# Patient Record
Sex: Female | Born: 1940 | Race: Black or African American | Hispanic: No | Marital: Married | Smoking: Former smoker
Health system: Southern US, Community
[De-identification: ages and names within clinical notes are randomized; demographics above are authoritative.]

## PROBLEM LIST (undated history)

## (undated) DIAGNOSIS — K635 Polyp of colon: Secondary | ICD-10-CM

## (undated) DIAGNOSIS — R32 Unspecified urinary incontinence: Secondary | ICD-10-CM

## (undated) DIAGNOSIS — K219 Gastro-esophageal reflux disease without esophagitis: Secondary | ICD-10-CM

## (undated) DIAGNOSIS — M1990 Unspecified osteoarthritis, unspecified site: Secondary | ICD-10-CM

## (undated) DIAGNOSIS — M62838 Other muscle spasm: Secondary | ICD-10-CM

## (undated) DIAGNOSIS — Z0000 Encounter for general adult medical examination without abnormal findings: Secondary | ICD-10-CM

## (undated) DIAGNOSIS — J069 Acute upper respiratory infection, unspecified: Secondary | ICD-10-CM

## (undated) DIAGNOSIS — Z124 Encounter for screening for malignant neoplasm of cervix: Secondary | ICD-10-CM

## (undated) DIAGNOSIS — R0602 Shortness of breath: Secondary | ICD-10-CM

## (undated) DIAGNOSIS — M549 Dorsalgia, unspecified: Secondary | ICD-10-CM

## (undated) DIAGNOSIS — E739 Lactose intolerance, unspecified: Secondary | ICD-10-CM

## (undated) DIAGNOSIS — D649 Anemia, unspecified: Secondary | ICD-10-CM

## (undated) DIAGNOSIS — R7989 Other specified abnormal findings of blood chemistry: Secondary | ICD-10-CM

## (undated) DIAGNOSIS — R399 Unspecified symptoms and signs involving the genitourinary system: Secondary | ICD-10-CM

## (undated) DIAGNOSIS — E559 Vitamin D deficiency, unspecified: Secondary | ICD-10-CM

## (undated) DIAGNOSIS — I10 Essential (primary) hypertension: Secondary | ICD-10-CM

## (undated) DIAGNOSIS — G5600 Carpal tunnel syndrome, unspecified upper limb: Secondary | ICD-10-CM

## (undated) DIAGNOSIS — H269 Unspecified cataract: Secondary | ICD-10-CM

## (undated) DIAGNOSIS — M8580 Other specified disorders of bone density and structure, unspecified site: Secondary | ICD-10-CM

## (undated) DIAGNOSIS — L989 Disorder of the skin and subcutaneous tissue, unspecified: Secondary | ICD-10-CM

## (undated) DIAGNOSIS — R490 Dysphonia: Secondary | ICD-10-CM

## (undated) DIAGNOSIS — Z872 Personal history of diseases of the skin and subcutaneous tissue: Secondary | ICD-10-CM

## (undated) HISTORY — DX: Unspecified osteoarthritis, unspecified site: M19.90

## (undated) HISTORY — DX: Dysphonia: R49.0

## (undated) HISTORY — DX: Encounter for general adult medical examination without abnormal findings: Z00.00

## (undated) HISTORY — PX: WRIST GANGLION EXCISION: SUR520

## (undated) HISTORY — DX: Other specified disorders of bone density and structure, unspecified site: M85.80

## (undated) HISTORY — DX: Essential (primary) hypertension: I10

## (undated) HISTORY — DX: Lactose intolerance, unspecified: E73.9

## (undated) HISTORY — DX: Gastro-esophageal reflux disease without esophagitis: K21.9

## (undated) HISTORY — DX: Shortness of breath: R06.02

## (undated) HISTORY — DX: Personal history of diseases of the skin and subcutaneous tissue: Z87.2

## (undated) HISTORY — PX: BREAST CYST EXCISION: SHX579

## (undated) HISTORY — DX: Vitamin D deficiency, unspecified: E55.9

## (undated) HISTORY — DX: Other muscle spasm: M62.838

## (undated) HISTORY — DX: Disorder of the skin and subcutaneous tissue, unspecified: L98.9

## (undated) HISTORY — DX: Polyp of colon: K63.5

## (undated) HISTORY — DX: Unspecified urinary incontinence: R32

## (undated) HISTORY — DX: Encounter for screening for malignant neoplasm of cervix: Z12.4

## (undated) HISTORY — PX: OVARY SURGERY: SHX727

## (undated) HISTORY — DX: Anemia, unspecified: D64.9

## (undated) HISTORY — DX: Unspecified symptoms and signs involving the genitourinary system: R39.9

## (undated) HISTORY — DX: Other specified abnormal findings of blood chemistry: R79.89

## (undated) HISTORY — DX: Dorsalgia, unspecified: M54.9

## (undated) HISTORY — DX: Acute upper respiratory infection, unspecified: J06.9

## (undated) HISTORY — DX: Unspecified cataract: H26.9

## (undated) HISTORY — DX: Carpal tunnel syndrome, unspecified upper limb: G56.00

## (undated) HISTORY — PX: APPENDECTOMY: SHX54

---

## 2000-05-26 ENCOUNTER — Encounter: Payer: Self-pay | Admitting: Family Medicine

## 2000-05-26 ENCOUNTER — Ambulatory Visit (HOSPITAL_COMMUNITY): Admission: RE | Admit: 2000-05-26 | Discharge: 2000-05-26 | Payer: Self-pay | Admitting: Family Medicine

## 2001-08-08 ENCOUNTER — Other Ambulatory Visit: Admission: RE | Admit: 2001-08-08 | Discharge: 2001-08-08 | Payer: Self-pay | Admitting: *Deleted

## 2003-06-14 ENCOUNTER — Emergency Department (HOSPITAL_COMMUNITY): Admission: EM | Admit: 2003-06-14 | Discharge: 2003-06-14 | Payer: Self-pay | Admitting: Family Medicine

## 2005-02-20 ENCOUNTER — Encounter: Payer: Self-pay | Admitting: Internal Medicine

## 2005-02-20 LAB — CONVERTED CEMR LAB

## 2005-03-06 ENCOUNTER — Ambulatory Visit: Payer: Self-pay | Admitting: Internal Medicine

## 2005-03-25 ENCOUNTER — Ambulatory Visit: Payer: Self-pay | Admitting: Cardiology

## 2005-03-27 ENCOUNTER — Ambulatory Visit: Payer: Self-pay | Admitting: Internal Medicine

## 2005-04-20 ENCOUNTER — Ambulatory Visit: Payer: Self-pay | Admitting: Internal Medicine

## 2005-04-30 ENCOUNTER — Ambulatory Visit: Payer: Self-pay | Admitting: Internal Medicine

## 2005-05-04 ENCOUNTER — Ambulatory Visit: Payer: Self-pay | Admitting: Internal Medicine

## 2005-06-03 ENCOUNTER — Ambulatory Visit: Payer: Self-pay | Admitting: Internal Medicine

## 2005-06-10 ENCOUNTER — Ambulatory Visit: Payer: Self-pay | Admitting: Internal Medicine

## 2005-06-19 ENCOUNTER — Ambulatory Visit (HOSPITAL_COMMUNITY): Admission: RE | Admit: 2005-06-19 | Discharge: 2005-06-19 | Payer: Self-pay | Admitting: Internal Medicine

## 2005-06-25 ENCOUNTER — Ambulatory Visit: Payer: Self-pay | Admitting: Internal Medicine

## 2005-07-10 ENCOUNTER — Ambulatory Visit: Payer: Self-pay | Admitting: Internal Medicine

## 2005-07-20 ENCOUNTER — Ambulatory Visit: Payer: Self-pay | Admitting: Internal Medicine

## 2005-07-20 ENCOUNTER — Encounter (INDEPENDENT_AMBULATORY_CARE_PROVIDER_SITE_OTHER): Payer: Self-pay | Admitting: Specialist

## 2005-10-08 ENCOUNTER — Ambulatory Visit (HOSPITAL_COMMUNITY): Admission: RE | Admit: 2005-10-08 | Discharge: 2005-10-08 | Payer: Self-pay | Admitting: Internal Medicine

## 2005-10-08 ENCOUNTER — Ambulatory Visit: Payer: Self-pay | Admitting: Internal Medicine

## 2005-10-28 ENCOUNTER — Encounter: Admission: RE | Admit: 2005-10-28 | Discharge: 2005-11-18 | Payer: Self-pay | Admitting: Internal Medicine

## 2005-11-06 ENCOUNTER — Ambulatory Visit: Payer: Self-pay | Admitting: Internal Medicine

## 2005-11-30 ENCOUNTER — Ambulatory Visit: Payer: Self-pay | Admitting: Internal Medicine

## 2005-12-21 ENCOUNTER — Ambulatory Visit: Payer: Self-pay | Admitting: Internal Medicine

## 2006-03-22 ENCOUNTER — Ambulatory Visit: Payer: Self-pay | Admitting: Internal Medicine

## 2006-03-22 LAB — CONVERTED CEMR LAB
BUN: 16 mg/dL (ref 6–23)
CO2: 30 meq/L (ref 19–32)
Calcium: 8.8 mg/dL (ref 8.4–10.5)
Chloride: 108 meq/L (ref 96–112)
Creatinine, Ser: 1.1 mg/dL (ref 0.4–1.2)
GFR calc Af Amer: 64 mL/min
GFR calc non Af Amer: 53 mL/min
Glucose, Bld: 77 mg/dL (ref 70–99)
Potassium: 3.7 meq/L (ref 3.5–5.1)
Sodium: 145 meq/L (ref 135–145)

## 2006-03-25 ENCOUNTER — Ambulatory Visit: Payer: Self-pay | Admitting: Internal Medicine

## 2006-08-06 ENCOUNTER — Encounter: Payer: Self-pay | Admitting: Internal Medicine

## 2006-08-06 DIAGNOSIS — I10 Essential (primary) hypertension: Secondary | ICD-10-CM | POA: Insufficient documentation

## 2006-08-06 DIAGNOSIS — Q638 Other specified congenital malformations of kidney: Secondary | ICD-10-CM

## 2006-08-06 DIAGNOSIS — Q631 Lobulated, fused and horseshoe kidney: Secondary | ICD-10-CM | POA: Insufficient documentation

## 2006-09-22 ENCOUNTER — Ambulatory Visit: Payer: Self-pay | Admitting: Internal Medicine

## 2006-09-22 ENCOUNTER — Ambulatory Visit: Payer: Self-pay | Admitting: Family Medicine

## 2007-03-21 ENCOUNTER — Ambulatory Visit: Payer: Self-pay | Admitting: Internal Medicine

## 2007-03-21 DIAGNOSIS — R252 Cramp and spasm: Secondary | ICD-10-CM | POA: Insufficient documentation

## 2007-03-21 DIAGNOSIS — R059 Cough, unspecified: Secondary | ICD-10-CM | POA: Insufficient documentation

## 2007-03-21 DIAGNOSIS — M25539 Pain in unspecified wrist: Secondary | ICD-10-CM | POA: Insufficient documentation

## 2007-03-21 DIAGNOSIS — R05 Cough: Secondary | ICD-10-CM | POA: Insufficient documentation

## 2007-03-21 DIAGNOSIS — M62838 Other muscle spasm: Secondary | ICD-10-CM

## 2007-03-21 DIAGNOSIS — J209 Acute bronchitis, unspecified: Secondary | ICD-10-CM | POA: Insufficient documentation

## 2007-03-21 HISTORY — DX: Other muscle spasm: M62.838

## 2007-03-29 ENCOUNTER — Encounter: Payer: Self-pay | Admitting: Internal Medicine

## 2007-04-07 ENCOUNTER — Emergency Department (HOSPITAL_COMMUNITY): Admission: EM | Admit: 2007-04-07 | Discharge: 2007-04-07 | Payer: Self-pay | Admitting: Emergency Medicine

## 2007-04-08 ENCOUNTER — Telehealth: Payer: Self-pay | Admitting: Internal Medicine

## 2007-04-08 ENCOUNTER — Ambulatory Visit: Payer: Self-pay | Admitting: Internal Medicine

## 2007-04-08 DIAGNOSIS — S139XXA Sprain of joints and ligaments of unspecified parts of neck, initial encounter: Secondary | ICD-10-CM | POA: Insufficient documentation

## 2007-04-09 ENCOUNTER — Telehealth (INDEPENDENT_AMBULATORY_CARE_PROVIDER_SITE_OTHER): Payer: Self-pay | Admitting: *Deleted

## 2007-05-17 ENCOUNTER — Telehealth: Payer: Self-pay | Admitting: Internal Medicine

## 2007-05-18 ENCOUNTER — Encounter (INDEPENDENT_AMBULATORY_CARE_PROVIDER_SITE_OTHER): Payer: Self-pay | Admitting: *Deleted

## 2007-06-16 ENCOUNTER — Encounter: Payer: Self-pay | Admitting: Internal Medicine

## 2007-07-01 ENCOUNTER — Ambulatory Visit: Payer: Self-pay | Admitting: Internal Medicine

## 2007-07-01 DIAGNOSIS — J309 Allergic rhinitis, unspecified: Secondary | ICD-10-CM | POA: Insufficient documentation

## 2007-10-21 ENCOUNTER — Ambulatory Visit: Payer: Self-pay | Admitting: Internal Medicine

## 2007-10-21 LAB — CONVERTED CEMR LAB
BUN: 20 mg/dL (ref 6–23)
Basophils Relative: 0.8 % (ref 0.0–3.0)
CRP, High Sensitivity: 19 — ABNORMAL HIGH (ref 0.00–5.00)
Calcium: 8.8 mg/dL (ref 8.4–10.5)
Creatinine, Ser: 1.1 mg/dL (ref 0.4–1.2)
Eosinophils Absolute: 0.4 10*3/uL (ref 0.0–0.7)
Eosinophils Relative: 6.7 % — ABNORMAL HIGH (ref 0.0–5.0)
GFR calc Af Amer: 64 mL/min
GFR calc non Af Amer: 53 mL/min
Glucose, Bld: 82 mg/dL (ref 70–99)
HCT: 37 % (ref 36.0–46.0)
HDL: 40.4 mg/dL (ref >39.0)
Hemoglobin: 12.6 g/dL (ref 12.0–15.0)
MCV: 86 fL (ref 78.0–100.0)
Monocytes Absolute: 0.6 10*3/uL (ref 0.1–1.0)
Monocytes Relative: 8.4 % (ref 3.0–12.0)
Neutro Abs: 3.6 10*3/uL (ref 1.4–7.7)
Platelets: 219 10*3/uL (ref 150–400)
Potassium: 4.2 meq/L (ref 3.5–5.1)
RBC: 4.31 M/uL (ref 3.87–5.11)
Total CHOL/HDL Ratio: 4.9
WBC: 6.7 10*3/uL (ref 4.5–10.5)

## 2007-10-28 ENCOUNTER — Ambulatory Visit: Payer: Self-pay | Admitting: Internal Medicine

## 2007-10-28 DIAGNOSIS — G4700 Insomnia, unspecified: Secondary | ICD-10-CM | POA: Insufficient documentation

## 2007-10-28 DIAGNOSIS — E782 Mixed hyperlipidemia: Secondary | ICD-10-CM | POA: Insufficient documentation

## 2007-11-18 ENCOUNTER — Encounter: Payer: Self-pay | Admitting: Internal Medicine

## 2007-11-21 ENCOUNTER — Ambulatory Visit: Payer: Self-pay | Admitting: Internal Medicine

## 2007-12-15 ENCOUNTER — Ambulatory Visit (HOSPITAL_BASED_OUTPATIENT_CLINIC_OR_DEPARTMENT_OTHER): Admission: RE | Admit: 2007-12-15 | Discharge: 2007-12-15 | Payer: Self-pay | Admitting: Orthopedic Surgery

## 2007-12-15 ENCOUNTER — Encounter (INDEPENDENT_AMBULATORY_CARE_PROVIDER_SITE_OTHER): Payer: Self-pay | Admitting: Orthopedic Surgery

## 2007-12-23 ENCOUNTER — Ambulatory Visit: Payer: Self-pay | Admitting: Internal Medicine

## 2007-12-23 LAB — CONVERTED CEMR LAB
AST: 14 units/L (ref 0–37)
HDL: 41.4 mg/dL (ref >39.0)
Total CHOL/HDL Ratio: 3.9
Triglycerides: 51 mg/dL (ref 0–149)
VLDL: 10 mg/dL (ref 0–40)

## 2007-12-30 ENCOUNTER — Encounter: Payer: Self-pay | Admitting: Internal Medicine

## 2008-01-24 ENCOUNTER — Ambulatory Visit: Payer: Self-pay | Admitting: Internal Medicine

## 2008-01-24 DIAGNOSIS — R220 Localized swelling, mass and lump, head: Secondary | ICD-10-CM | POA: Insufficient documentation

## 2008-01-24 DIAGNOSIS — R221 Localized swelling, mass and lump, neck: Secondary | ICD-10-CM

## 2008-01-25 ENCOUNTER — Ambulatory Visit: Payer: Self-pay | Admitting: Internal Medicine

## 2008-01-25 ENCOUNTER — Telehealth: Payer: Self-pay | Admitting: Internal Medicine

## 2008-01-25 LAB — CONVERTED CEMR LAB: BUN: 19 mg/dL (ref 6–23)

## 2008-01-27 ENCOUNTER — Ambulatory Visit: Payer: Self-pay | Admitting: Cardiology

## 2008-01-27 ENCOUNTER — Encounter: Payer: Self-pay | Admitting: Internal Medicine

## 2008-01-29 ENCOUNTER — Telehealth (INDEPENDENT_AMBULATORY_CARE_PROVIDER_SITE_OTHER): Payer: Self-pay | Admitting: *Deleted

## 2008-02-15 ENCOUNTER — Other Ambulatory Visit: Admission: RE | Admit: 2008-02-15 | Discharge: 2008-02-15 | Payer: Self-pay | Admitting: Otolaryngology

## 2008-02-15 ENCOUNTER — Encounter: Payer: Self-pay | Admitting: Internal Medicine

## 2008-03-05 ENCOUNTER — Ambulatory Visit: Payer: Self-pay | Admitting: Diagnostic Radiology

## 2008-03-05 ENCOUNTER — Ambulatory Visit (HOSPITAL_BASED_OUTPATIENT_CLINIC_OR_DEPARTMENT_OTHER): Admission: RE | Admit: 2008-03-05 | Discharge: 2008-03-05 | Payer: Self-pay | Admitting: Internal Medicine

## 2008-03-05 ENCOUNTER — Ambulatory Visit: Payer: Self-pay | Admitting: Internal Medicine

## 2008-03-09 ENCOUNTER — Encounter: Payer: Self-pay | Admitting: Internal Medicine

## 2008-03-22 ENCOUNTER — Ambulatory Visit (HOSPITAL_BASED_OUTPATIENT_CLINIC_OR_DEPARTMENT_OTHER): Admission: RE | Admit: 2008-03-22 | Discharge: 2008-03-22 | Payer: Self-pay | Admitting: Internal Medicine

## 2008-03-22 ENCOUNTER — Ambulatory Visit: Payer: Self-pay | Admitting: Diagnostic Radiology

## 2008-03-22 ENCOUNTER — Ambulatory Visit: Payer: Self-pay | Admitting: Internal Medicine

## 2008-03-22 ENCOUNTER — Telehealth: Payer: Self-pay | Admitting: Internal Medicine

## 2008-03-22 DIAGNOSIS — R0989 Other specified symptoms and signs involving the circulatory and respiratory systems: Secondary | ICD-10-CM | POA: Insufficient documentation

## 2008-03-22 DIAGNOSIS — R0609 Other forms of dyspnea: Secondary | ICD-10-CM

## 2008-03-22 LAB — CONVERTED CEMR LAB
Chloride: 109 meq/L (ref 96–112)
Eosinophils Absolute: 0.5 10*3/uL (ref 0.0–0.7)
GFR calc Af Amer: 44 mL/min
GFR calc non Af Amer: 37 mL/min
Glucose, Bld: 88 mg/dL (ref 70–99)
HCT: 36.7 % (ref 36.0–46.0)
Hemoglobin: 12.6 g/dL (ref 12.0–15.0)
MCHC: 34.4 g/dL (ref 30.0–36.0)
Monocytes Absolute: 4.3 10*3/uL — ABNORMAL HIGH (ref 0.1–1.0)
Potassium: 4.3 meq/L (ref 3.5–5.1)
RBC: 4.24 M/uL (ref 3.87–5.11)
RDW: 13.4 % (ref 11.5–14.6)
Sodium: 144 meq/L (ref 135–145)

## 2008-03-24 ENCOUNTER — Telehealth: Payer: Self-pay | Admitting: Internal Medicine

## 2008-04-06 ENCOUNTER — Ambulatory Visit: Payer: Self-pay | Admitting: Internal Medicine

## 2008-05-04 ENCOUNTER — Ambulatory Visit: Payer: Self-pay | Admitting: Internal Medicine

## 2008-05-04 LAB — CONVERTED CEMR LAB
ALT: 19 units/L (ref 0–35)
AST: 17 units/L (ref 0–37)
Chloride: 108 meq/L (ref 96–112)
Cholesterol: 154 mg/dL (ref 0–200)
GFR calc non Af Amer: 70.86 mL/min (ref >60)
HDL: 43.3 mg/dL (ref >39.00)
Potassium: 4.6 meq/L (ref 3.5–5.1)
Sodium: 142 meq/L (ref 135–145)
Triglycerides: 48 mg/dL (ref 0.0–149.0)

## 2008-05-11 ENCOUNTER — Ambulatory Visit: Payer: Self-pay | Admitting: Internal Medicine

## 2008-06-21 ENCOUNTER — Ambulatory Visit: Payer: Self-pay | Admitting: Internal Medicine

## 2008-06-21 ENCOUNTER — Ambulatory Visit (HOSPITAL_BASED_OUTPATIENT_CLINIC_OR_DEPARTMENT_OTHER): Admission: RE | Admit: 2008-06-21 | Discharge: 2008-06-21 | Payer: Self-pay | Admitting: Internal Medicine

## 2008-06-21 ENCOUNTER — Ambulatory Visit: Payer: Self-pay | Admitting: Radiology

## 2008-06-21 DIAGNOSIS — R071 Chest pain on breathing: Secondary | ICD-10-CM | POA: Insufficient documentation

## 2008-06-21 DIAGNOSIS — L259 Unspecified contact dermatitis, unspecified cause: Secondary | ICD-10-CM | POA: Insufficient documentation

## 2008-06-21 LAB — CONVERTED CEMR LAB
Bilirubin Urine: NEGATIVE
Glucose, Urine, Semiquant: NEGATIVE
Ketones, urine, test strip: NEGATIVE
Specific Gravity, Urine: >=1.03
pH: 5

## 2008-06-28 ENCOUNTER — Ambulatory Visit: Payer: Self-pay | Admitting: Internal Medicine

## 2008-07-04 ENCOUNTER — Encounter: Payer: Self-pay | Admitting: Internal Medicine

## 2008-07-05 ENCOUNTER — Telehealth: Payer: Self-pay | Admitting: Internal Medicine

## 2008-07-10 ENCOUNTER — Encounter: Payer: Self-pay | Admitting: Internal Medicine

## 2008-11-28 ENCOUNTER — Ambulatory Visit: Payer: Self-pay | Admitting: Family

## 2008-11-29 ENCOUNTER — Encounter: Payer: Self-pay | Admitting: Internal Medicine

## 2008-11-29 ENCOUNTER — Ambulatory Visit: Payer: Self-pay | Admitting: Internal Medicine

## 2008-12-21 ENCOUNTER — Ambulatory Visit (HOSPITAL_BASED_OUTPATIENT_CLINIC_OR_DEPARTMENT_OTHER): Admission: RE | Admit: 2008-12-21 | Discharge: 2008-12-21 | Payer: Self-pay | Admitting: Internal Medicine

## 2008-12-21 ENCOUNTER — Ambulatory Visit: Payer: Self-pay | Admitting: Internal Medicine

## 2008-12-21 ENCOUNTER — Ambulatory Visit: Payer: Self-pay | Admitting: Diagnostic Radiology

## 2008-12-21 DIAGNOSIS — M25561 Pain in right knee: Secondary | ICD-10-CM | POA: Insufficient documentation

## 2008-12-21 DIAGNOSIS — M25559 Pain in unspecified hip: Secondary | ICD-10-CM | POA: Insufficient documentation

## 2008-12-25 ENCOUNTER — Telehealth: Payer: Self-pay | Admitting: Internal Medicine

## 2008-12-25 DIAGNOSIS — N644 Mastodynia: Secondary | ICD-10-CM | POA: Insufficient documentation

## 2008-12-26 ENCOUNTER — Telehealth: Payer: Self-pay | Admitting: Internal Medicine

## 2008-12-28 ENCOUNTER — Encounter: Payer: Self-pay | Admitting: Internal Medicine

## 2009-01-03 ENCOUNTER — Encounter: Payer: Self-pay | Admitting: Internal Medicine

## 2009-01-14 ENCOUNTER — Telehealth: Payer: Self-pay | Admitting: Internal Medicine

## 2009-02-05 ENCOUNTER — Encounter: Payer: Self-pay | Admitting: Internal Medicine

## 2009-02-06 ENCOUNTER — Encounter: Payer: Self-pay | Admitting: Internal Medicine

## 2009-02-15 ENCOUNTER — Telehealth: Payer: Self-pay | Admitting: Internal Medicine

## 2009-02-18 ENCOUNTER — Encounter: Payer: Self-pay | Admitting: Internal Medicine

## 2009-02-22 ENCOUNTER — Ambulatory Visit: Payer: Self-pay | Admitting: Internal Medicine

## 2009-02-22 DIAGNOSIS — N63 Unspecified lump in unspecified breast: Secondary | ICD-10-CM | POA: Insufficient documentation

## 2009-03-14 ENCOUNTER — Encounter: Admission: RE | Admit: 2009-03-14 | Discharge: 2009-03-14 | Payer: Self-pay | Admitting: General Surgery

## 2009-03-18 ENCOUNTER — Encounter: Payer: Self-pay | Admitting: Internal Medicine

## 2009-03-18 ENCOUNTER — Ambulatory Visit (HOSPITAL_BASED_OUTPATIENT_CLINIC_OR_DEPARTMENT_OTHER): Admission: RE | Admit: 2009-03-18 | Discharge: 2009-03-18 | Payer: Self-pay | Admitting: General Surgery

## 2009-04-08 ENCOUNTER — Encounter: Payer: Self-pay | Admitting: Internal Medicine

## 2009-04-25 ENCOUNTER — Telehealth: Payer: Self-pay | Admitting: Internal Medicine

## 2009-06-28 ENCOUNTER — Encounter: Payer: Self-pay | Admitting: Internal Medicine

## 2009-08-14 ENCOUNTER — Telehealth: Payer: Self-pay | Admitting: Internal Medicine

## 2009-08-27 ENCOUNTER — Encounter: Payer: Self-pay | Admitting: Internal Medicine

## 2009-08-28 LAB — HM MAMMOGRAPHY: HM Mammogram: ABNORMAL

## 2009-09-13 ENCOUNTER — Ambulatory Visit: Payer: Self-pay | Admitting: Internal Medicine

## 2009-09-13 DIAGNOSIS — K219 Gastro-esophageal reflux disease without esophagitis: Secondary | ICD-10-CM | POA: Insufficient documentation

## 2009-09-13 LAB — CONVERTED CEMR LAB
CO2: 27 meq/L (ref 19–32)
Chloride: 106 meq/L (ref 96–112)
Cholesterol: 219 mg/dL — ABNORMAL HIGH (ref 0–200)
Glucose, Bld: 83 mg/dL (ref 70–99)
LDL Cholesterol: 158 mg/dL — ABNORMAL HIGH (ref 0–99)
Potassium: 4.4 meq/L (ref 3.5–5.3)
Sodium: 143 meq/L (ref 135–145)
Total CHOL/HDL Ratio: 4.9
VLDL: 16 mg/dL (ref 0–40)

## 2009-09-17 ENCOUNTER — Telehealth: Payer: Self-pay | Admitting: Internal Medicine

## 2009-09-18 ENCOUNTER — Telehealth: Payer: Self-pay | Admitting: Internal Medicine

## 2009-12-16 ENCOUNTER — Ambulatory Visit: Payer: Self-pay | Admitting: Internal Medicine

## 2009-12-17 ENCOUNTER — Encounter: Payer: Self-pay | Admitting: Internal Medicine

## 2009-12-17 LAB — CONVERTED CEMR LAB
Alkaline Phosphatase: 58 units/L (ref 39–117)
Bilirubin, Direct: 0.1 mg/dL (ref 0.0–0.3)
Cholesterol: 154 mg/dL (ref 0–200)
LDL Cholesterol: 108 mg/dL — ABNORMAL HIGH (ref 0–99)
Total Protein: 6.6 g/dL (ref 6.0–8.3)

## 2010-01-03 ENCOUNTER — Encounter: Payer: Self-pay | Admitting: Internal Medicine

## 2010-02-02 ENCOUNTER — Encounter: Payer: Self-pay | Admitting: Internal Medicine

## 2010-02-13 NOTE — Progress Notes (Signed)
Summary: [facility]  Phone Note Call from Patient Call back at Home Phone [phone]   Caller: Patient Call For: D. Thomos Lemons DO Summary of Call: patient called and left voice message stating she has a mild cough that she can not get rid of. She states she had 2 tessalon pearls left over from a previous prescription and would like to know if Dr Artist Pais would provide her a rx for her cough.  Initial call taken by: Glendell Docker CMA,  September 18, 2009 4:26 PM  Follow-up for Phone Call        refill x 1 only.  if persistent cough, I suggest OV Follow-up by: D. Thomos Lemons DO,  September 19, 2009 1:14 PM  Additional Follow-up for Phone Call Additional follow up Details #1::        call placed to patient at [phone], she has been advised per Dr Artist Pais instructions Additional Follow-up by: Glendell Docker CMA,  September 19, 2009 1:21 PM    New/Updated Medications: TESSALON PERLES 100 MG CAPS (BENZONATATE) Take 1 capsule by mouth three times a day as needed cough Prescriptions: TESSALON PERLES 100 MG CAPS (BENZONATATE) Take 1 capsule by mouth three times a day as needed cough  #30 x 0   Entered by:   Glendell Docker CMA   Authorized by:   D. Thomos Lemons DO   Signed by:   Glendell Docker CMA on 09/19/2009   Method used:   [facility]        [facility] [address] [phone]* (retail)       [address]       [place], [place]  [address]       Ph: [phone] or [phone]       Fax: [phone]   RxID:   [id number]

## 2010-02-13 NOTE — Progress Notes (Signed)
Summary: REFILL REQUEST  Phone Note Refill Request Message from:  Fax from Pharmacy on January 14, 2009 2:44 PM  Refills Requested: Medication #1:  AMLODIPINE BESYLATE 10 MG TABS one by mouth once daily   Dosage confirmed as above?Dosage Confirmed   Brand Name Necessary? No   Supply Requested: 1 month   Last Refilled: 12/17/2008  Method Requested: Electronic Next Appointment Scheduled: 02-22-09 215 dR Harith Mccadden  Initial call taken by: Roselle Locus,  January 14, 2009 2:45 PM  Follow-up for Phone Call        patient is to return in feb 2011. Refilled prescription. Follow-up by: Lucious Groves,  January 14, 2009 4:59 PM    Prescriptions: AMLODIPINE BESYLATE 10 MG TABS (AMLODIPINE BESYLATE) one by mouth once daily  #30 Tablet x 2   Entered by:   Lucious Groves   Authorized by:   D. Thomos Lemons DO   Signed by:   Lucious Groves on 01/14/2009   Method used:   Electronically to        [facility] [address] [phone]* (retail)       [address]       [place], [place]  [address]       Ph: [phone] or [phone]       Fax: [phone]   RxID:   [id number]

## 2010-02-13 NOTE — Progress Notes (Signed)
Summary: Norvasc Refill  Phone Note Refill Request Message from:  Fax from Pharmacy on August 14, 2009 10:47 AM  Refills Requested: Medication #1:  AMLODIPINE BESYLATE 10 MG TABS one by mouth once daily   Dosage confirmed as above?Dosage Confirmed   Brand Name Necessary? No   Supply Requested: 1 month   Last Refilled: 07/13/2009  Method Requested: Electronic Next Appointment Scheduled: No future appointments on file Initial call taken by: Glendell Docker CMA,  August 14, 2009 10:48 AM  Follow-up for Phone Call        call placed to patient to inform follow up office visit is needed. She states she has requested the Brand name for Norvasc because the generic has given he headaches. She was informed rx for brand Norvasc would be sent to pharmacy. Patient states she is willing to pay out of pocket for the medication. She has scheduled a follow up with Dr Artist Pais for 09/13/2009 Follow-up by: Glendell Docker CMA,  August 14, 2009 10:56 AM    New/Updated Medications: NORVASC 10 MG TABS (AMLODIPINE BESYLATE) Take 1 tablet by mouth once a day Prescriptions: NORVASC 10 MG TABS (AMLODIPINE BESYLATE) Take 1 tablet by mouth once a day  #30 x 3   Entered by:   Glendell Docker CMA   Authorized by:   D. Thomos Lemons DO   Signed by:   Glendell Docker CMA on 08/14/2009   Method used:   Electronically to        [facility] [address] [phone]* (retail)       [address]       [place], [place]  [address]       Ph: [phone] or [phone]       Fax: [phone]   RxID:   [id number]

## 2010-02-13 NOTE — Letter (Signed)
Summary: [facility]   Imported By: Lanelle Bal 04/25/2009 14:16:20  _____________________________________________________________________  External Attachment:    Type:   Image     Comment:   External Document

## 2010-02-13 NOTE — Consult Note (Signed)
Summary: [facility]   Imported By: Lanelle Bal 03/12/2009 12:30:33  _____________________________________________________________________  External Attachment:    Type:   Image     Comment:   External Document

## 2010-02-13 NOTE — Progress Notes (Signed)
Summary: Lab Results  Phone Note Outgoing Call   Summary of Call: call pt - cholesteroal levels too high.  I suggest pt start generic pravastatin.  see rx pt needs repeat FLP, and LFTs in 3 months Initial call taken by: D. Thomos Lemons DO,  September 17, 2009 6:12 PM  Follow-up for Phone Call         call placed to patient at [phone] she has been advised per Dr Artist Pais instructions. Labs have been entered in December for [place] location Follow-up by: Glendell Docker CMA,  September 18, 2009 2:28 PM    New/Updated Medications: PRAVASTATIN SODIUM 40 MG TABS (PRAVASTATIN SODIUM) one by mouth once daily Prescriptions: PRAVASTATIN SODIUM 40 MG TABS (PRAVASTATIN SODIUM) one by mouth once daily  #30 x 5   Entered and Authorized by:   D. Thomos Lemons DO   Signed by:   D. Thomos Lemons DO on 09/17/2009   Method used:   Electronically to        [facility] [address] [phone]* (retail)       [address]       [place], [place]  [address]       Ph: [phone] or [phone]       Fax: [phone]   RxID:   [id number]

## 2010-02-13 NOTE — Letter (Signed)
Summary: [facility]   Imported By: Lanelle Bal 02/14/2009 11:25:53  _____________________________________________________________________  External Attachment:    Type:   Image     Comment:   External Document

## 2010-02-13 NOTE — Assessment & Plan Note (Signed)
Summary: 2 month follow up/mhf   Vital Signs:  Patient profile:   70 year old female Weight:      223 pounds BMI:     32.11 O2 Sat:      99 % on Room air Temp:     97.8 degrees F oral Pulse rate:   67 / minute Pulse rhythm:   regular Resp:     16 per minute BP sitting:   126 / 70  (right arm) Cuff size:   large  Vitals Entered By: Glendell Docker CMA (February 22, 2009 2:31 PM)  O2 Flow:  Room air  Primary Care Provider:  D. Thomos Lemons DO  CC:  2 Month Folloow up and Pre-op Evaluation.  History of Present Illness: 2 Month Follow up  Pre-Op Evaluation     70 y/o AA female schedule for left breast biopsy 3/7 - Dr. Dwain Sarna.   The patient denies respiratory symptoms, chest pain, and edema.  Patient has no history of acute or recent MI and unstable or severe angina.  Patient has no history of diabetes(m).    Right breast/chest discomfort better.  breast studies negative of right side where she was symptomatic.  Left mammo was abnormal  Allergies: 1)  ! Percocet 2)  ! Allegra 3)  ! 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   Head:  normocephalic and atraumatic.   Mouth:  pharynx pink and moist.   Lungs:  normal respiratory effort, normal breath sounds, no crackles, and no wheezes.   Heart:  normal rate, regular rhythm, no murmur, and no gallop.   Extremities:  No lower extremity edema    Impression & Recommendations:  Problem # 1:   PREOPERATIVE EXAMINATION (ICD-V72.84) 70 y/o AA female with well controlled htn and hyperlipidemia scheduled for left breast biopsy.  Low surgical risk.   EKG shows NSR at 68 bpm.   Stop asprin at fish oil supplements before procedure.  No further cardiac testing recommended.    Problem # 2:  BREAST MASS, LEFT (ICD-611.72) Mammo showed small cluster of hypoechoic masses at 12:00.  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  Fibrocystic changes with associated calcifications.  She is scheduled for wire guided lumpectomy/biopsy.  Problem # 3:  BREAST PAIN, RIGHT (ICD-611.71) Work up negative. symptoms presumed secondary to muskuloskeletal etiology.    Complete Medication List: 1)  Amlodipine Besylate 10 Mg Tabs (Amlodipine besylate) .... One by mouth once daily 2)  Bayer Low Strength 81 Mg Tbec (Aspirin) .... Take 1 tablet by mouth once a day 3)  Voltaren 1 % Gel (Diclofenac sodium) .... Apply 2 gm  three times a day 4)  Fish Oil Double Strength 1200 Mg Caps (Omega-3 fatty acids) .... 2 caps by mouth in am 5)  Symbicort 80-4.5 Mcg/act Aero (Budesonide-formoterol fumarate) .... 2 puffs by mouth bid 6)  Calcium Carbonate 600 Mg Tabs (Calcium carbonate) .... Take 1 tablet by mouth once a day 7)  Crestor 10 Mg Tabs (Rosuvastatin calcium) .... One by mouth qd 8)  [facility] Omeprazole 20 Mg Tbec (Omeprazole) .Marland Kitchen.. 1 capsule by mouth every day 30 minutes before meals 9)  Xyzal 5 Mg Tabs (Levocetirizine dihydrochloride) .... Take 1 tablet by mouth once a day  Patient Instructions: 1)  Stop aspirin and fish oil supplements 3-4 days before surgery 2)  Please schedule a follow-up appointment in 6 months.  Current Allergies (reviewed today): ! PERCOCET ! ALLEGRA ! DARVOCET SIMVASTATIN (SIMVASTATIN)

## 2010-02-13 NOTE — Progress Notes (Signed)
  Phone Note From Other Clinic   Caller: Referral Coordinator Details for Reason: MRI  breast  Summary of Call: MRI was order at the [facility] , pt had to have diagnostic mammogram before MRI ,pt had a mammogram and a  biopsy and now scheduled for surgery.  Raynelle Fanning at [facility] want to know if we need to cancel the MRI Initial call taken by: Darral Dash,  February 15, 2009 11:39 AM  Follow-up for Phone Call        ok to cancel MRI of breast Follow-up by: D. Thomos Lemons DO,  February 15, 2009 11:51 AM  Additional Follow-up for Phone Call Additional follow up Details #1::        MRI cancelled  at [facility]  Additional Follow-up by: Darral Dash,  February 15, 2009 1:46 PM

## 2010-02-13 NOTE — Progress Notes (Signed)
Summary: Amlodippine Refill  Phone Note Refill Request Message from:  Patient on April 25, 2009 3:33 PM  Refills Requested: Medication #1:  AMLODIPINE BESYLATE 10 MG TABS one by mouth once daily   Dosage confirmed as above?Dosage Confirmed   Brand Name Necessary? No   Supply Requested: 1 month [facility]    Method Requested: Electronic Next Appointment Scheduled: none Initial call taken by: Roselle Locus,  April 25, 2009 3:33 PM  Follow-up for Phone Call        Rx completed in Dr. Tiajuana Amass Follow-up by: Glendell Docker CMA,  April 25, 2009 4:58 PM    Prescriptions: AMLODIPINE BESYLATE 10 MG TABS (AMLODIPINE BESYLATE) one by mouth once daily  #30 x 3   Entered by:   Glendell Docker CMA   Authorized by:   D. Thomos Lemons DO   Signed by:   Glendell Docker CMA on 04/25/2009   Method used:   Electronically to        [facility] [address] [phone]* (retail)       [address]       [place], [place]  [address]       Ph: [phone] or [phone]       Fax: [phone]   RxID:   [id number]

## 2010-02-13 NOTE — Assessment & Plan Note (Signed)
Summary: 6 MONTH FOLLOW UP/DK   Vital Signs:  Patient profile:   70 year old female Height:      70 inches Weight:      225 pounds BMI:     32.40 O2 Sat:      97 % on Room air Temp:     97.9 degrees F Pulse rate:   65 / minute Pulse rhythm:   regular Resp:     16 per minute BP sitting:   122 / 70  (right arm) Cuff size:   large  Vitals Entered By: Glendell Docker CMA (September 13, 2009 9:10 AM)  O2 Flow:  Room air CC: 6 Month Follow up  Is Patient Diabetic? No Pain Assessment Patient in pain? no      Comments no concerns , refill Xyzal- patient request brand name   Primary Care Jacorie Ernsberger:  DThomos Lemons DO  CC:  6 Month Follow up .  History of Present Illness: 70 y/o AA female for f/u  interval hx:   had breast biopsy in 03/2009  1. BREAST, LUMPECTOMY, LEFT : - RADIAL SCAR.   - fibroadenoma.   - fibrocystic changes with microcalcifications and usual ductal hyperplasia.   - malignant features are not identified.   - see comment.  htn - stable    Preventive Screening-Counseling & Management  Alcohol-Tobacco     Smoking Status: quit  Allergies: 1)  ! Percocet 2)  ! Allegra 3)  ! 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  History of breast cysts  Right wrist carpal tunnel syndrome    History of elevated Cr with ACE inhibitor (Normal MRA of renal arteries) Colon Polyp         Past Surgical History:  Right Cyst Breast removal RIght Ovary & tube replacement           Family History: Mother deceased age 45 - brain cancer Father deceased age 67 - stabbed to death Brother died of sepsis           Social History: Former Smoker - quit 20 years ago (1/2 ppd x 40 years) Widowed 1 Daughter            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   Ears:  no external deformities.   Lungs:  normal respiratory effort, normal breath sounds, no crackles, and no wheezes.   Heart:  normal rate, regular rhythm, no murmur, and no gallop.   Neurologic:  cranial nerves II-XII intact and gait normal.     Contraindications/Deferment of Procedures/Staging:    Test/Procedure: FLU VAX    Reason for deferment: patient declined   Impression & Recommendations:  Problem # 1:  HYPERTENSION (ICD-401.9) Assessment Unchanged  Her updated medication list for this problem includes:    Norvasc 10 Mg Tabs (Amlodipine besylate) .Marland Kitchen... Take 1 tablet by mouth once a day  Orders: T-Basic Metabolic Panel [phone]) T-Lipid Profile [phone]) T-TSH [phone])  BP today: 122/70 Prior BP: 126/70 (02/22/2009)  Labs Reviewed: K+: 4.6 (05/04/2008) Creat: : 1.0 (05/04/2008)   Chol: 154 (05/04/2008)   HDL: 43.30 (05/04/2008)   LDL: 101 (05/04/2008)   TG: 48.0 (05/04/2008)  Problem # 2:  GERD (ICD-530.81) Assessment: Unchanged  Her updated medication list for this problem includes:  [facility] Omeprazole 20 Mg Tbec (Omeprazole) .Marland Kitchen... 1 capsule by mouth every day 30 minutes before meals  Labs Reviewed: Hgb: 12.6 (03/22/2008)   Hct: 36.7 (03/22/2008)  Problem # 3:  ALLERGIC RHINITIS (ICD-477.9) Assessment: Deteriorated  Her updated medication list for this problem includes:    Xyzal 5 Mg Tabs (Levocetirizine dihydrochloride) .Marland Kitchen... Take 1 tablet by mouth once a day  Complete Medication List: 1)  Norvasc 10 Mg Tabs (Amlodipine besylate) .... Take 1 tablet by mouth once a day 2)  Bayer Low Strength 81 Mg Tbec (Aspirin) .... Take 1 tablet by mouth once a day 3)  Voltaren 1 % Gel (Diclofenac sodium) .... Apply 2 gm  three times a day 4)  Fish Oil Double Strength 1200 Mg Caps (Omega-3 fatty acids) .... 2 caps by mouth in am 5)  Symbicort 80-4.5 Mcg/act Aero (Budesonide-formoterol fumarate) .... 2 puffs by mouth bid 6)  Calcium  Carbonate 600 Mg Tabs (Calcium carbonate) .... Take 1 tablet by mouth once a day 7)  Cvs Omeprazole 20 Mg Tbec (Omeprazole) .Marland Kitchen.. 1 capsule by mouth every day 30 minutes before meals 8)  Xyzal 5 Mg Tabs (Levocetirizine dihydrochloride) .... Take 1 tablet by mouth once a day 9)  Pravastatin Sodium 40 Mg Tabs (Pravastatin sodium) .... One by mouth once daily 10)  Tessalon Perles 100 Mg Caps (Benzonatate) .... Take 1 capsule by mouth three times a day as needed cough  Patient Instructions: 1)  Please schedule a follow-up appointment in 6 months. Prescriptions: XYZAL 5 MG TABS (LEVOCETIRIZINE DIHYDROCHLORIDE) Take 1 tablet by mouth once a day  #90 x 1   Entered and Authorized by:   D. Thomos Lemons DO   Signed by:   D. Thomos Lemons DO on 09/13/2009   Method used:   Electronically to        [facility] [address] [phone]* (retail)       [address]       [place], [place]  [address]       Ph: [phone] or [phone]       Fax: [phone]   RxID:   [id number] [facility] OMEPRAZOLE 20 MG TBEC (OMEPRAZOLE) 1 capsule by mouth every day 30 minutes before meals  #90 x 1   Entered and Authorized by:   D. Thomos Lemons DO   Signed by:   D. Thomos Lemons DO on 09/13/2009   Method used:   Electronically to        [facility]  [facility] [address] [phone]* (retail)       [address]       [place], [place]  [address]       Ph: [phone] or [phone]       Fax: [phone]   RxID:   [id number] NORVASC 10 MG TABS (AMLODIPINE BESYLATE) Take 1 tablet by mouth once a day  #90 x 1   Entered and Authorized by:   D. Thomos Lemons DO   Signed by:   D. Thomos Lemons DO on 09/13/2009   Method used:   Electronically to        [facility]  [facility] [address] [phone]* (retail)       [address]       [place], [place]  [address]       Ph: [phone] or [phone]       Fax: [phone]  RxID:   [id number]   Current Allergies (reviewed today): !  PERCOCET ! ALLEGRA ! DARVOCET SIMVASTATIN (SIMVASTATIN)   Preventive Care Screening  Last Flu Shot:    Date:  09/13/2009    Results:  Declined  Mammogram:    Date:  08/28/2009    Results:  abnormal left

## 2010-02-13 NOTE — Letter (Signed)
   [place] [facility] [address] [place], [place]  [address]  [place] Phone: [phone]      December 17, 2009   [facility] [address] [place], [place] [address]  RE:  LAB RESULTS  Dear  Ms. WHITE,  The following is an interpretation of your most recent lab tests.  Please take note of any instructions provided or changes to medications that have resulted from your lab work.  LIPID PANEL:  Stable - no changes needed Triglyceride: 48.0   Cholesterol: 154   LDL: 108   HDL: 36.50   Chol/HDL%:  4        Sincerely Yours,    Dr. Thomos Lemons  Appended Document:  mailed

## 2010-02-13 NOTE — Op Note (Signed)
Summary: [facility]  [facility]   Imported By: Lanelle Bal 02/14/2009 11:52:46  _____________________________________________________________________  External Attachment:    Type:   Image     Comment:   External Document

## 2010-02-17 ENCOUNTER — Telehealth: Payer: Self-pay | Admitting: Internal Medicine

## 2010-02-27 NOTE — Progress Notes (Signed)
Summary: Norvasc refill  Phone Note Refill Request Message from:  Fax from Pharmacy on February 17, 2010 9:44 AM  Refills Requested: Medication #1:  NORVASC 10 MG TABS Take 1 tablet by mouth once a day   Dosage confirmed as above?Dosage Confirmed   Brand Name Necessary? No   Supply Requested: 1 month   [facility] [address] [place] [address] fax [phone]   Method Requested: Electronic Next Appointment Scheduled: 03.09.12 Rajvir Ernster Initial call taken by: Elba Barman,  February 17, 2010 9:44 AM  Follow-up for Phone Call        Rx completed in Dr. Tiajuana Amass Follow-up by: Glendell Docker CMA,  February 18, 2010 9:52 AM    Prescriptions: NORVASC 10 MG TABS (AMLODIPINE BESYLATE) Take 1 tablet by mouth once a day  #90 x 0   Entered by:   Glendell Docker CMA   Authorized by:   D. Thomos Lemons DO   Signed by:   Glendell Docker CMA on 02/18/2010   Method used:   Electronically to        [facility] [address] [phone]* (retail)       [address]       [place], [place]  [address]       Ph: [phone] or [phone]       Fax: [phone]   RxID:   [id number]

## 2010-03-21 ENCOUNTER — Ambulatory Visit: Payer: Self-pay | Admitting: Internal Medicine

## 2010-04-07 LAB — BASIC METABOLIC PANEL
BUN: 19 mg/dL (ref 6–23)
CO2: 28 mEq/L (ref 19–32)
Chloride: 105 mEq/L (ref 96–112)
Creatinine, Ser: 1.06 mg/dL (ref 0.4–1.2)

## 2010-04-07 LAB — CBC
HCT: 36.1 % (ref 36.0–46.0)
MCHC: 33.6 g/dL (ref 30.0–36.0)
MCV: 86.9 fL (ref 78.0–100.0)
RBC: 4.15 MIL/uL (ref 3.87–5.11)

## 2010-04-07 LAB — DIFFERENTIAL
Basophils Relative: 0 % (ref 0–1)
Eosinophils Absolute: 0.3 10*3/uL (ref 0.0–0.7)
Eosinophils Relative: 4 % (ref 0–5)
Monocytes Relative: 6 % (ref 3–12)
Neutrophils Relative %: 65 % (ref 43–77)

## 2010-05-07 ENCOUNTER — Encounter: Payer: Self-pay | Admitting: Internal Medicine

## 2010-05-08 ENCOUNTER — Encounter: Payer: Self-pay | Admitting: Internal Medicine

## 2010-05-08 ENCOUNTER — Ambulatory Visit (INDEPENDENT_AMBULATORY_CARE_PROVIDER_SITE_OTHER): Payer: Medicare Other | Admitting: Internal Medicine

## 2010-05-08 DIAGNOSIS — I10 Essential (primary) hypertension: Secondary | ICD-10-CM

## 2010-05-08 DIAGNOSIS — L259 Unspecified contact dermatitis, unspecified cause: Secondary | ICD-10-CM

## 2010-05-08 MED ORDER — OMEPRAZOLE 20 MG PO TBEC: 20.0000 mg | DELAYED_RELEASE_TABLET | Freq: Every day | ORAL | Status: DC

## 2010-05-08 MED ORDER — PRAVASTATIN SODIUM 40 MG PO TABS: 40.0000 mg | ORAL_TABLET | Freq: Every day | ORAL | Status: DC

## 2010-05-08 MED ORDER — AMLODIPINE BESYLATE 10 MG PO TABS: 10.0000 mg | ORAL_TABLET | Freq: Every day | ORAL | Status: DC

## 2010-05-08 MED ORDER — CLOBETASOL PROPIONATE 0.05 % EX CREA: TOPICAL_CREAM | Freq: Two times a day (BID) | CUTANEOUS | Status: AC

## 2010-05-08 NOTE — Patient Instructions (Addendum)
Please complete the following lab tests before your next follow up appointment: BMET - 401.9 FLP, LFTs, TSH - 272.4 

## 2010-05-27 NOTE — Op Note (Signed)
NAMEFYNLEY, CHRYSTAL               ACCOUNT NO.:  [id number]   MEDICAL RECORD NO.:  [id number]          PATIENT TYPE:  AMB   LOCATION:  [facility]                          FACILITY:  [facility]   PHYSICIAN:  Cindee Salt, M.D.       DATE OF BIRTH:  December 24, 1940   DATE OF PROCEDURE:  12/15/2007  DATE OF DISCHARGE:                               OPERATIVE REPORT   PREOPERATIVE DIAGNOSIS:  Suzette Battiest with cyst right wrist.   POSTOPERATIVE DIAGNOSIS:  Suzette Battiest with cyst right wrist.   OPERATION:  Excision cyst multiple x4 with release of first dorsal  compartment right wrist.   SURGEON:  Cindee Salt, MD   ASSISTANTCarolyne Fiscal, RN   ANESTHESIA:  Forearm based IV regional.   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  This has not responded to conservative treatment.  She has elected to  undergo removal of the cyst with debridement release of the first dorsal  compartment.  She is aware of risks and complications including  infection, recurrence injury to arteries, nerves, tendons, incomplete  relief of symptoms and dystrophy.  In the preoperative area, the patient  is seen.  The extremity marked by both the patient and surgeon and  antibiotic given.   PROCEDURE:  The patient is brought to the operating room where a forearm  based IV regional anesthetic was carried out without difficulty under  the direction of Dr. Sampson Goon.  She was prepped using DuraPrep, supine  position, right arm free.  A time-out was taken.  A longitudinal  incision was then made over the cyst in first dorsal compartment and  carried down through the subcutaneous tissue.  Bleeders were  electrocauterized.  The radial nerve was identified and protected.  Multiple cysts were easily identified.  With blunt and sharp dissection,  these were dissected free and sent to pathology.   The first dorsal  compartment was then released on its most dorsal aspect.  The EDB tendon  was immediately encountered.  A septum was present.  This was released  and excised.  The abductor pollicis longus was also released.  The  compartment was left intact.  The specimens were sent to pathology.  The  wound was copiously irrigated with saline.  The skin closed with  interrupted 5-0 Vicryl Rapide sutures.  Sterile compressive dressing and  thumb spica splint applied.  The patient was taken to the recovery room  for observation in satisfactory condition having tolerated the procedure  well.  She was discharged home to return to the [facility] in 1 week on [place] NX.           ______________________________  Cindee Salt, M.D.    GK/MEDQ  D:  12/15/2007  T:  12/15/2007  Job:  [id number]

## 2010-05-31 NOTE — Assessment & Plan Note (Signed)
Use topical steroid crm as directed. Patient advised to call office if symptoms persist or worsen.

## 2010-05-31 NOTE — Progress Notes (Signed)
Subjective:    Patient ID: Terri Mckee, female    DOB: 1940-06-27, 70 y.o.   MRN: [id number]  Hypertension This is a chronic problem. The problem is controlled. Pertinent negatives include no chest pain or shortness of breath. There are no associated agents to hypertension. Past treatments include calcium channel blockers. There are no compliance problems.  There is no history of heart failure.      Review of Systems  Respiratory: Negative for shortness of breath.   Cardiovascular: Negative for chest pain.       Past Medical History  Diagnosis Date  . Hypertension   . 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 History of cyst of breast   . 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 Colon polyps   . Elevated serum creatinine     with ACE inhibitor (Normal MRA of renal arteries)    History   Social History  . Marital Status: Widowed    Spouse Name: N/A    Number of Children: N/A  . Years of Education: N/A   Occupational History  . Not on file.   Social History Main Topics  . Smoking status: Former Games developer  . Smokeless tobacco: Not on file   Comment: quit 20 years ago 1/2 ppd x 40 years  . Alcohol Use: Not on file  . Drug Use: Not on file  . Sexually Active: Not on file   Other Topics Concern  . Not on file   Social History Narrative   Former Smoker - quit 20 years ago (1/2 ppd x 40 years)Widowed1 Daughter         Past Surgical History  Procedure Date  . Breast cyst excision     right breast  . Ovary surgery     right ovary & tube replacement    Family History  Problem Relation Age of Onset  . Brain cancer Mother     deceased age 73  . Other Brother     died of sepsis    Allergies  Allergen Reactions  . Fexofenadine   . Oxycodone-Acetaminophen   . Propoxyphene N-Acetaminophen     REACTION: severe vomiting  . Simvastatin     REACTION: Myalgia    Current Outpatient Prescriptions on File  Prior to Visit  Medication Sig Dispense Refill  . aspirin 81 MG tablet Take 81 mg by mouth daily.        . budesonide-formoterol (SYMBICORT) 80-4.5 MCG/ACT inhaler Inhale 2 puffs into the lungs 2 (two) times daily.        . calcium carbonate (OS-CAL) 600 MG TABS Take 600 mg by mouth daily.        Marland Kitchen levocetirizine (XYZAL) 5 MG tablet Take 5 mg by mouth every evening.        . Omega-3 Fatty Acids (FISH OIL) 1200 MG CAPS Take 1,200 mg by mouth. 2 capsules by mouth once daily in the morning       . diclofenac sodium (VOLTAREN) 1 % GEL Apply 1 application topically 3 (three) times daily. Apply  2 grams three times a day to affected area as needed         BP 120/60  Pulse 64  Temp(Src) 97.9 F (36.6 C) (Oral)  Resp 20  Ht 5\' 10"  (1.778 m)  Wt 231 lb (104.781 kg)  BMI 33.15 kg/m2  SpO2 100%    Objective:  Physical Exam  Constitutional: She appears well-developed and well-nourished.  Cardiovascular: Normal rate and regular rhythm.   No murmur heard. Pulmonary/Chest: Effort normal and breath sounds normal. She has no wheezes.  Musculoskeletal: She exhibits no edema.  Psychiatric: She has a normal mood and affect. Her behavior is normal.  Skin - scattered scaly areas on right hand     Assessment & Plan:

## 2010-05-31 NOTE — Assessment & Plan Note (Signed)
Well controlled.  Continue current medication regimen.  BP: 120/60 mmHg  Lab Results  Component Value Date   CREATININE 1.19 09/13/2009

## 2010-06-27 ENCOUNTER — Telehealth: Payer: Self-pay | Admitting: *Deleted

## 2010-06-27 NOTE — Telephone Encounter (Signed)
Patient called and left voice message wanting to know if it is okay to take a diet supplement with her current medication

## 2010-06-30 NOTE — Telephone Encounter (Signed)
Call placed to patient at [phone], no answer. A detailed voice message was left for patient to return call regarding the spelling and ingredients of the dietary supplement that she has questions about.

## 2010-07-03 NOTE — Telephone Encounter (Signed)
No return call from patient regarding phone message.

## 2010-07-30 ENCOUNTER — Encounter: Payer: Self-pay | Admitting: Internal Medicine

## 2010-07-30 ENCOUNTER — Ambulatory Visit (INDEPENDENT_AMBULATORY_CARE_PROVIDER_SITE_OTHER): Payer: Medicare Other | Admitting: Internal Medicine

## 2010-07-30 VITALS — BP 109/66 | HR 67 | Temp 97.8°F | Ht 70.0 in | Wt 229.0 lb

## 2010-07-30 DIAGNOSIS — M549 Dorsalgia, unspecified: Secondary | ICD-10-CM

## 2010-07-30 MED ORDER — DICLOFENAC SODIUM 1 % TD GEL: 1.0000 "application " | Freq: Three times a day (TID) | TRANSDERMAL | Status: DC

## 2010-07-30 MED ORDER — DICLOFENAC SODIUM 50 MG PO TBEC: DELAYED_RELEASE_TABLET | ORAL | Status: AC

## 2010-07-30 MED ORDER — KETOROLAC TROMETHAMINE 30 MG/ML IJ SOLN
30.0000 mg | Freq: Once | INTRAMUSCULAR | Status: AC
  Administered 2010-07-30: 30 mg via INTRAMUSCULAR

## 2010-07-30 MED ORDER — CYCLOBENZAPRINE HCL 5 MG PO TABS: 5.0000 mg | ORAL_TABLET | Freq: Three times a day (TID) | ORAL | Status: AC | PRN

## 2010-08-02 DIAGNOSIS — M549 Dorsalgia, unspecified: Secondary | ICD-10-CM | POA: Insufficient documentation

## 2010-08-02 NOTE — Assessment & Plan Note (Signed)
Toradol 30 mg IM injection today. Attempt Flexeril p.r.n. And is cautioned regarding possible sedating effect. Attempt Voltaren to be taken with food and other anti-inflammatories.Followup if no improvement or worsening.

## 2010-08-02 NOTE — Progress Notes (Signed)
  Subjective:    Patient ID: Terri Mckee, female    DOB: 1940/03/30, 70 y.o.   MRN: [id number]  HPI Patient presents to clinic for evaluation of back pain. Notes an approximate one-week history of bilateral lower back pain without injury or trauma. Denies radicular leg pain numbness tingling or weakness. Has attempted topical heat, Tylenol without significant improvement. Pain worsened with activity. No other alleviating or exacerbating factors. Denies history of PUD or renal insufficiency. No other complaints  Reviewed past medical history, medications and allergies    Review of Systems see history of present illness     Objective:   Physical Exam  Nursing note and vitals reviewed. Constitutional: She appears well-developed and well-nourished. No distress.  HENT:  Head: Normocephalic and atraumatic.  Eyes: Conjunctivae are normal.  Musculoskeletal:       No midline lumbar sacral tenderness or bony abnormality. No significant paraspinal muscle spasm. Bilateral lower extremity strength 5/5. Gait normal  Neurological: She is alert.  Skin: Skin is warm and dry. She is not diaphoretic.  Psychiatric: She has a normal mood and affect.          Assessment & Plan:

## 2010-08-13 ENCOUNTER — Encounter: Payer: Self-pay | Admitting: Internal Medicine

## 2010-10-02 ENCOUNTER — Telehealth: Payer: Self-pay | Admitting: Internal Medicine

## 2010-10-02 MED ORDER — LEVOCETIRIZINE DIHYDROCHLORIDE 5 MG PO TABS: 5.0000 mg | ORAL_TABLET | Freq: Every evening | ORAL | Status: DC

## 2010-10-02 NOTE — Telephone Encounter (Signed)
levocetirizine 5mg  tablet. Take one tablet by mouth once a day. Qty 90. Last fill 2.10.12

## 2010-10-02 NOTE — Telephone Encounter (Signed)
Rx refill sent to pharmacy. 

## 2010-10-17 LAB — POCT I-STAT, CHEM 8
BUN: 19 mg/dL (ref 6–23)
Calcium, Ion: 1.14 mmol/L (ref 1.12–1.32)
Chloride: 107 mEq/L (ref 96–112)
Creatinine, Ser: 1.4 mg/dL — ABNORMAL HIGH (ref 0.4–1.2)
Glucose, Bld: 92 mg/dL (ref 70–99)
Potassium: 3.7 mEq/L (ref 3.5–5.1)

## 2010-10-31 ENCOUNTER — Other Ambulatory Visit: Payer: Self-pay | Admitting: Internal Medicine

## 2010-10-31 ENCOUNTER — Encounter: Payer: Self-pay | Admitting: Internal Medicine

## 2010-10-31 ENCOUNTER — Ambulatory Visit (INDEPENDENT_AMBULATORY_CARE_PROVIDER_SITE_OTHER): Payer: Medicare Other | Admitting: Internal Medicine

## 2010-10-31 DIAGNOSIS — E785 Hyperlipidemia, unspecified: Secondary | ICD-10-CM

## 2010-10-31 DIAGNOSIS — Z23 Encounter for immunization: Secondary | ICD-10-CM

## 2010-10-31 DIAGNOSIS — I10 Essential (primary) hypertension: Secondary | ICD-10-CM

## 2010-10-31 DIAGNOSIS — Z79899 Other long term (current) drug therapy: Secondary | ICD-10-CM

## 2010-10-31 MED ORDER — DICLOFENAC SODIUM 1 % TD GEL: 1.0000 "application " | Freq: Three times a day (TID) | TRANSDERMAL | Status: DC

## 2010-10-31 MED ORDER — BUDESONIDE-FORMOTEROL FUMARATE 80-4.5 MCG/ACT IN AERO: 2.0000 | INHALATION_SPRAY | Freq: Two times a day (BID) | RESPIRATORY_TRACT | Status: DC

## 2010-10-31 NOTE — Patient Instructions (Signed)
Please schedule fasting labs for next Friday morning (Terri Mckee) CBC 401.9, chem7 v58.69 and lipid/lft 272.4 Please schedule fasting labs prior to next appointment: chem7 v58.69 and lipid/lft 272.4

## 2010-10-31 NOTE — Progress Notes (Signed)
  Subjective:    Patient ID: Terri Mckee, female    DOB: [birth year], 70 y.o.   MRN: [id number]  HPI Pt presents to clinic for followup of multiple medical problems. BP reviewed normotensive and tolerates norvasc without LE swelling. No thrush with symbicort and does rinse mouth. Planning to call gyn for checkup and to schedule mammogram. Tolerates statin therapy without myalgia or abnormal lft. Uses voltaren gel prn for knee pain with improvement. No other complaints.   Past Medical History  Diagnosis Date  . Hypertension   . 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 History of cyst of breast   . 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 Colon polyps   . Elevated serum creatinine     with ACE inhibitor (Normal MRA of renal arteries)   Past Surgical History  Procedure Date  . Breast cyst excision     right breast  . Ovary surgery     right ovary & tube replacement    reports that she has quit smoking. She does not have any smokeless tobacco history on file. Her alcohol and drug histories not on file. family history includes Brain cancer in her mother and Other in her brother. Allergies  Allergen Reactions  . Erythromycin Nausea Only  . Fexofenadine   . Oxycodone-Acetaminophen   . Propoxyphene N-Acetaminophen     REACTION: severe vomiting  . Simvastatin     REACTION: Myalgia     Review of Systems see hpi     Objective:   Physical Exam  Physical Exam  Nursing note and vitals reviewed. Constitutional: Appears well-developed and well-nourished. No distress.  HENT:  Head: Normocephalic and atraumatic.  Right Ear: External ear normal.  Left Ear: External ear normal.  Eyes: Conjunctivae are normal. No scleral icterus.  Neck: Neck supple. Carotid bruit is not present.  Cardiovascular: Normal rate, regular rhythm and normal heart sounds.  Exam reveals no gallop and no friction rub.   No murmur  heard. Pulmonary/Chest: Effort normal and breath sounds normal. No respiratory distress. He has no wheezes. no rales.  Lymphadenopathy:    He has no cervical adenopathy.  Neurological:Alert.  Skin: Skin is warm and dry. Not diaphoretic.  Psychiatric: Has a normal mood and affect.        Assessment & Plan:

## 2010-10-31 NOTE — Assessment & Plan Note (Addendum)
Normotensive and stable. Continue current regimen. Monitor bp as outpt and followup in clinic as scheduled. Obtain cbc and chem7 

## 2010-10-31 NOTE — Assessment & Plan Note (Signed)
Stable. Obtain lipid/lft

## 2010-11-07 ENCOUNTER — Other Ambulatory Visit (INDEPENDENT_AMBULATORY_CARE_PROVIDER_SITE_OTHER): Payer: Medicare Other

## 2010-11-07 DIAGNOSIS — Z79899 Other long term (current) drug therapy: Secondary | ICD-10-CM

## 2010-11-07 DIAGNOSIS — E785 Hyperlipidemia, unspecified: Secondary | ICD-10-CM

## 2010-11-07 DIAGNOSIS — I10 Essential (primary) hypertension: Secondary | ICD-10-CM

## 2010-11-07 LAB — HEPATIC FUNCTION PANEL
AST: 20 U/L (ref 0–37)
Albumin: 3.7 g/dL (ref 3.5–5.2)
Alkaline Phosphatase: 57 U/L (ref 39–117)
Bilirubin, Direct: 0.1 mg/dL (ref 0.0–0.3)
Total Protein: 7.3 g/dL (ref 6.0–8.3)

## 2010-11-07 LAB — CBC WITH DIFFERENTIAL/PLATELET
Basophils Absolute: 0 10*3/uL (ref 0.0–0.1)
Basophils Relative: 0.7 % (ref 0.0–3.0)
Eosinophils Absolute: 0.3 10*3/uL (ref 0.0–0.7)
HCT: 38.8 % (ref 36.0–46.0)
Hemoglobin: 12.9 g/dL (ref 12.0–15.0)
Lymphs Abs: 1.6 10*3/uL (ref 0.7–4.0)
MCHC: 33.3 g/dL (ref 30.0–36.0)
MCV: 87.3 fl (ref 78.0–100.0)
Neutro Abs: 3 10*3/uL (ref 1.4–7.7)
RBC: 4.45 Mil/uL (ref 3.87–5.11)
RDW: 14.1 % (ref 11.5–14.6)

## 2010-11-07 LAB — LIPID PANEL
Cholesterol: 138 mg/dL (ref 0–200)
LDL Cholesterol: 83 mg/dL (ref 0–99)
Total CHOL/HDL Ratio: 3
Triglycerides: 55 mg/dL (ref 0.0–149.0)

## 2010-11-07 LAB — BASIC METABOLIC PANEL
BUN: 21 mg/dL (ref 6–23)
Calcium: 8.8 mg/dL (ref 8.4–10.5)
Chloride: 108 mEq/L (ref 96–112)
Creatinine, Ser: 1.2 mg/dL (ref 0.4–1.2)

## 2010-12-10 ENCOUNTER — Other Ambulatory Visit: Payer: Self-pay | Admitting: Internal Medicine

## 2010-12-10 NOTE — Telephone Encounter (Signed)
Rx refill sent to pharmacy. 

## 2010-12-29 ENCOUNTER — Telehealth: Payer: Self-pay | Admitting: Internal Medicine

## 2010-12-29 MED ORDER — PRAVASTATIN SODIUM 40 MG PO TABS: 40.0000 mg | ORAL_TABLET | Freq: Every day | ORAL | Status: DC

## 2010-12-29 NOTE — Telephone Encounter (Signed)
Refill- pravastatin sodium 40mg  tab. Take one tablet (40mg  total) by mouth daily. Qty last fill 9.11.12

## 2010-12-29 NOTE — Telephone Encounter (Signed)
Rx refill sent to pharmacy. 

## 2011-06-10 ENCOUNTER — Ambulatory Visit (INDEPENDENT_AMBULATORY_CARE_PROVIDER_SITE_OTHER): Payer: Medicare Other | Admitting: Family

## 2011-06-10 ENCOUNTER — Encounter: Payer: Self-pay | Admitting: Family

## 2011-06-10 VITALS — BP 114/76 | HR 73 | Temp 98.1°F | Resp 16 | Ht 70.0 in | Wt 231.1 lb

## 2011-06-10 DIAGNOSIS — L259 Unspecified contact dermatitis, unspecified cause: Secondary | ICD-10-CM

## 2011-06-10 MED ORDER — BETAMETHASONE DIPROPIONATE 0.05 % EX CREA: TOPICAL_CREAM | Freq: Two times a day (BID) | CUTANEOUS | Status: AC

## 2011-06-10 NOTE — Patient Instructions (Signed)

## 2011-06-10 NOTE — Assessment & Plan Note (Signed)
Deteriorated.  Rx with diprolene.   Pt to call if symptoms worsen or if no improvement in 2-3 days.

## 2011-06-10 NOTE — Progress Notes (Signed)
Subjective:    Patient ID: Terri Mckee, female    DOB: November 02, 1940, 71 y.o.   MRN: [id number]  HPI  Terri Mckee is a 71 yr old female who presents today with chief complaint of rash.  Has used some antibiotic ointment, "crack cream" and vaseline without improvement.  Started 1 month ago.     Review of Systems See HPI  Past Medical History  Diagnosis Date  . Hypertension   . 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 History of cyst of breast   . 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 Colon polyps   . Elevated serum creatinine     with ACE inhibitor (Normal MRA of renal arteries)    History   Social History  . Marital Status: Married    Spouse Name: N/A    Number of Children: N/A  . Years of Education: N/A   Occupational History  . Not on file.   Social History Main Topics  . Smoking status: Former Games developer  . Smokeless tobacco: Not on file   Comment: quit 20 years ago 1/2 ppd x 40 years  . Alcohol Use: Not on file  . Drug Use: Not on file  . Sexually Active: Not on file   Other Topics Concern  . Not on file   Social History Narrative   Former Smoker - quit 20 years ago (1/2 ppd x 40 years)Widowed1 Daughter         Past Surgical History  Procedure Date  . Breast cyst excision     right breast  . Ovary surgery     right ovary & tube replacement    Family History  Problem Relation Age of Onset  . Brain cancer Mother     deceased age 37  . Other Brother     died of sepsis    Allergies  Allergen Reactions  . Erythromycin Nausea Only  . Fexofenadine   . Oxycodone-Acetaminophen   . Propoxyphene-Acetaminophen     REACTION: severe vomiting  . Simvastatin     REACTION: Myalgia    Current Outpatient Prescriptions on File Prior to Visit  Medication Sig Dispense Refill  . amLODipine (NORVASC) 10 MG tablet TAKE 1 TABLET (10 MG TOTAL) BY MOUTH DAILY.  90 tablet  1  . aspirin 81 MG tablet  Take 81 mg by mouth daily.        . budesonide-formoterol (SYMBICORT) 80-4.5 MCG/ACT inhaler Inhale 2 puffs into the lungs 2 (two) times daily.  1 Inhaler  6  . calcium carbonate (OS-CAL) 600 MG TABS Take 600 mg by mouth daily.        . diclofenac sodium (VOLTAREN) 1 % GEL Apply 1 application topically 3 (three) times daily. Apply  2 grams three times a day to affected area as needed  100 g  4  . levocetirizine (XYZAL) 5 MG tablet Take 1 tablet (5 mg total) by mouth every evening.  90 tablet  3  . Omega-3 Fatty Acids (FISH OIL) 1200 MG CAPS Take 1,200 mg by mouth. 2 capsules by mouth once daily in the morning       . Omeprazole ([facility] OMEPRAZOLE) 20 MG TBEC Take 1 tablet (20 mg total) by mouth daily. 1 capsule by mouth once daily 30 minutes before meals  30 each  5  . pravastatin (PRAVACHOL) 40 MG tablet Take 1 tablet (40 mg  total) by mouth daily.  90 tablet  1  . cyclobenzaprine (FLEXERIL) 5 MG tablet Take 1 tablet (5 mg total) by mouth every 8 (eight) hours as needed for muscle spasms.  30 tablet  0    BP 114/76  Pulse 73  Temp(Src) 98.1 F (36.7 C) (Oral)  Resp 16  Ht 5\' 10"  (1.778 m)  Wt 231 lb 1.3 oz (104.817 kg)  BMI 33.16 kg/m2  SpO2 99%       Objective:   Physical Exam  Constitutional: She is oriented to person, place, and time. She appears well-developed and well-nourished. No distress.  Pulmonary/Chest: Breath sounds normal.  Neurological: She is alert and oriented to person, place, and time.  Skin:       Right dorsal hand overlying 5th pip- thickend, scaling dry appearing skin with some cracking.  Psychiatric: She has a normal mood and affect. Her behavior is normal. Judgment and thought content normal.          Assessment & Plan:

## 2011-06-16 ENCOUNTER — Encounter: Payer: Self-pay | Admitting: Internal Medicine

## 2011-06-24 ENCOUNTER — Encounter: Payer: Self-pay | Admitting: Internal Medicine

## 2011-06-25 ENCOUNTER — Other Ambulatory Visit: Payer: Self-pay | Admitting: Internal Medicine

## 2011-06-25 NOTE — Telephone Encounter (Signed)
Rx refill sent to pharmacy. 

## 2011-06-29 ENCOUNTER — Telehealth: Payer: Self-pay | Admitting: *Deleted

## 2011-06-29 NOTE — Telephone Encounter (Signed)
appt tomorrow. If i remember correctly she was seeing gyn and they were ordering mammograms and following breasts.

## 2011-06-29 NOTE — Telephone Encounter (Signed)
Notified pt and scheduled appt for tomorrow at 2pm.

## 2011-06-29 NOTE — Telephone Encounter (Signed)
Pt called reporting that she had episode of "blood coming up in her mouth off and on x 1 hour" late Friday evening. Pt denies recent dental procedure/exam. Pt states she has had no other symptoms, denies cough or abdominal pain. Denies new fatigue. Reports that she has not had any further episodes since Friday.  Does note that she had a mammogram on Wednesday and is still having bilateral breast pain, worse on the left. Please advise.

## 2011-06-30 ENCOUNTER — Encounter: Payer: Self-pay | Admitting: Internal Medicine

## 2011-06-30 ENCOUNTER — Ambulatory Visit (INDEPENDENT_AMBULATORY_CARE_PROVIDER_SITE_OTHER): Payer: Medicare Other | Admitting: Internal Medicine

## 2011-06-30 VITALS — BP 108/60 | HR 65 | Temp 97.8°F | Resp 18 | Ht 70.0 in | Wt 232.0 lb

## 2011-06-30 DIAGNOSIS — K920 Hematemesis: Secondary | ICD-10-CM | POA: Insufficient documentation

## 2011-06-30 LAB — CBC WITH DIFFERENTIAL/PLATELET
Basophils Relative: 0 % (ref 0–1)
Eosinophils Absolute: 0.3 10*3/uL (ref 0.0–0.7)
Hemoglobin: 12.5 g/dL (ref 12.0–15.0)
MCH: 28.7 pg (ref 26.0–34.0)
MCHC: 33.9 g/dL (ref 30.0–36.0)
Monocytes Relative: 8 % (ref 3–12)
Neutro Abs: 3.4 10*3/uL (ref 1.7–7.7)
Neutrophils Relative %: 48 % (ref 43–77)
Platelets: 261 10*3/uL (ref 150–400)
RBC: 4.36 MIL/uL (ref 3.87–5.11)

## 2011-06-30 NOTE — Assessment & Plan Note (Signed)
Possible hematemesis after earlier emesis. ?MW tear. No further episodes. Doubt hemoptysis or pseudohemoptysis. Obtain CBC. If sx's return or hgb decreased proceed wit GI consult for consideration of possible EGD.

## 2011-06-30 NOTE — Progress Notes (Signed)
  Subjective:    Patient ID: Terri Mckee, female    DOB: 25-Feb-1940, 71 y.o.   MRN: [id number]  HPI Pt presents to clinic for evaluation of blood in her mouth. Was in her usual state of health until ~5 days ago while sitting brought up dark red blood in her mouth. Denies cough, hemoptysis or dyspnea. No n/v but 3 days earlier did have forceful episode of emesis after eating a hamburger. Denies abd pain, hematochezia or melena. No further episodes. No epistaxis. +mild fatigue. No other complaints.   Past Medical History  Diagnosis Date  . Hypertension   . 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 History of cyst of breast   . 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 Colon polyps   . Elevated serum creatinine     with ACE inhibitor (Normal MRA of renal arteries)   Past Surgical History  Procedure Date  . Breast cyst excision     right breast  . Ovary surgery     right ovary & tube replacement    reports that she has quit smoking. She has never used smokeless tobacco. She reports that she does not drink alcohol or use illicit drugs. family history includes Brain cancer in her mother and Other in her brother. Allergies  Allergen Reactions  . Erythromycin Nausea Only  . Fexofenadine   . Oxycodone-Acetaminophen   . Propoxyphene-Acetaminophen     REACTION: severe vomiting  . Simvastatin     REACTION: Myalgia     Review of Systems see hpi     Objective:   Physical Exam  Nursing note and vitals reviewed. Constitutional: She appears well-developed and well-nourished. No distress.  HENT:  Head: Normocephalic and atraumatic.  Right Ear: External ear normal.  Left Ear: External ear normal.  Nose: Mucosal edema present. No epistaxis.  No foreign bodies.  Mouth/Throat: Oropharynx is clear and moist. No oropharyngeal exudate.  Eyes: Conjunctivae are normal. No scleral icterus.  Neck: Neck supple.  Lymphadenopathy:   She has no cervical adenopathy.  Neurological: She is alert.  Skin: She is not diaphoretic.  Psychiatric: She has a normal mood and affect.          Assessment & Plan:

## 2011-07-10 ENCOUNTER — Other Ambulatory Visit: Payer: Self-pay | Admitting: Internal Medicine

## 2011-07-10 NOTE — Telephone Encounter (Signed)
Rx refill sent to pharmacy. 

## 2011-07-27 ENCOUNTER — Encounter: Payer: Self-pay | Admitting: Internal Medicine

## 2011-08-26 ENCOUNTER — Encounter: Payer: Medicare Other | Admitting: Internal Medicine

## 2011-09-21 ENCOUNTER — Encounter: Payer: Self-pay | Admitting: Internal Medicine

## 2011-09-21 ENCOUNTER — Ambulatory Visit (AMBULATORY_SURGERY_CENTER): Payer: Medicare Other | Admitting: *Deleted

## 2011-09-21 VITALS — Ht 70.0 in | Wt 227.9 lb

## 2011-09-21 DIAGNOSIS — Z860100 Personal history of colon polyps, unspecified: Secondary | ICD-10-CM

## 2011-09-21 DIAGNOSIS — Z1211 Encounter for screening for malignant neoplasm of colon: Secondary | ICD-10-CM

## 2011-09-21 MED ORDER — MOVIPREP 100 G PO SOLR: ORAL | Status: DC

## 2011-10-06 ENCOUNTER — Encounter: Payer: Self-pay | Admitting: Internal Medicine

## 2011-10-06 ENCOUNTER — Ambulatory Visit (AMBULATORY_SURGERY_CENTER): Payer: Medicare Other | Admitting: Internal Medicine

## 2011-10-06 VITALS — BP 145/73 | HR 64 | Temp 97.6°F | Resp 18 | Ht 70.0 in | Wt 227.0 lb

## 2011-10-06 DIAGNOSIS — Z1211 Encounter for screening for malignant neoplasm of colon: Secondary | ICD-10-CM

## 2011-10-06 DIAGNOSIS — Z86010 Personal history of colonic polyps: Secondary | ICD-10-CM

## 2011-10-06 DIAGNOSIS — D126 Benign neoplasm of colon, unspecified: Secondary | ICD-10-CM

## 2011-10-06 DIAGNOSIS — K635 Polyp of colon: Secondary | ICD-10-CM

## 2011-10-06 LAB — HM COLONOSCOPY

## 2011-10-06 MED ORDER — SODIUM CHLORIDE 0.9 % IV SOLN: 500.0000 mL | INTRAVENOUS | Status: DC

## 2011-10-06 NOTE — Patient Instructions (Addendum)

## 2011-10-06 NOTE — Op Note (Signed)
 [facility] [address] [place], [address]   COLONOSCOPY PROCEDURE REPORT  PATIENT: Terri, Mckee  MR#: [id number] BIRTHDATE: [birth year] , 71  yrs. old GENDER: Female ENDOSCOPIST: Roxy Cedar, MD REFERRED AO:[id number] Program Recall PROCEDURE DATE:  10/06/2011 PROCEDURE:   Colonoscopy with snare polypectomy    x 2 ASA CLASS:   Class II INDICATIONS:patient's personal history of adenomatous colon polyps and screening and surveillance,personal history of colonic polyps. Index exam 07-2005 W/ TA MEDICATIONS: MAC sedation, administered by CRNA and propofol (Diprivan) 120mg  IV  DESCRIPTION OF PROCEDURE:   After the risks benefits and alternatives of the procedure were thoroughly explained, informed consent was obtained.  A digital rectal exam revealed no abnormalities of the rectum.   The LB [id number]  endoscope was introduced through the anus and advanced to the cecum, which was identified by both the appendix and ileocecal valve. No adverse events experienced.   The quality of the prep was excellent, using MoviPrep  The instrument was then slowly withdrawn as the colon was fully examined.      COLON FINDINGS: Two diminutive polyps were found in the ascending colon.  A polypectomy was performed with a cold snare.  The resection was complete and the polyp tissue was retrieved.   The colon was otherwise normal.  There was no diverticulosis, inflammation, other polyps or cancers .  Retroflexed views revealed no abnormalities. The time to cecum=2 minutes 53 seconds. Withdrawal time=9 minutes 15 seconds.  The scope was withdrawn and the procedure completed. COMPLICATIONS: There were no complications.  ENDOSCOPIC IMPRESSION: 1.   Two diminutive polyps were found in the ascending colon; polypectomy was performed with a cold snare 2.   The colon was otherwise normal  RECOMMENDATIONS: 1. Follow up colonoscopy in 5 years   eSigned:  Roxy Cedar, MD 10/06/2011 11:40 AM   cc: Clifton Custard MD and The Patient   PATIENT NAME:  Terri, Mckee MR#: [id number]

## 2011-10-06 NOTE — Progress Notes (Signed)
No complaints noted in the recovery room. Maw  Patient did not have preoperative order for IV antibiotic SSI prophylaxis. (G8918) Patient did not experience any of the following events: a burn prior to discharge; a fall within the facility; wrong site/side/patient/procedure/implant event; or a hospital transfer or hospital admission upon discharge from the facility. (G8907)  

## 2011-10-07 ENCOUNTER — Telehealth: Payer: Self-pay | Admitting: *Deleted

## 2011-10-07 NOTE — Telephone Encounter (Signed)
  Follow up Call-  Call back number 10/06/2011  Post procedure Call Back phone  # [phone]  Permission to leave phone message Yes     Patient questions:  Do you have a fever, pain , or abdominal swelling? no Pain Score  0 *  Have you tolerated food without any problems? yes  Have you been able to return to your normal activities? yes  Do you have any questions about your discharge instructions: Diet   no Medications  no Follow up visit  no  Do you have questions or concerns about your Care? no  Actions: * If pain score is 4 or above: No action needed, pain <4.

## 2011-10-12 ENCOUNTER — Encounter: Payer: Self-pay | Admitting: Internal Medicine

## 2011-12-03 ENCOUNTER — Ambulatory Visit (HOSPITAL_BASED_OUTPATIENT_CLINIC_OR_DEPARTMENT_OTHER)
Admission: RE | Admit: 2011-12-03 | Discharge: 2011-12-03 | Disposition: A | Discharge status: Home / Self Care | Payer: Medicare Other | Source: Ambulatory Visit | Attending: Internal Medicine | Admitting: Internal Medicine

## 2011-12-03 ENCOUNTER — Encounter: Payer: Self-pay | Admitting: Internal Medicine

## 2011-12-03 ENCOUNTER — Ambulatory Visit (INDEPENDENT_AMBULATORY_CARE_PROVIDER_SITE_OTHER): Payer: Medicare Other | Admitting: Internal Medicine

## 2011-12-03 VITALS — BP 110/64 | HR 63 | Temp 98.1°F | Resp 16 | Wt 227.0 lb

## 2011-12-03 DIAGNOSIS — M542 Cervicalgia: Secondary | ICD-10-CM

## 2011-12-03 DIAGNOSIS — M47812 Spondylosis without myelopathy or radiculopathy, cervical region: Secondary | ICD-10-CM | POA: Insufficient documentation

## 2011-12-03 DIAGNOSIS — M5412 Radiculopathy, cervical region: Secondary | ICD-10-CM | POA: Insufficient documentation

## 2011-12-03 MED ORDER — METHYLPREDNISOLONE ACETATE 40 MG/ML IJ SUSP
40.0000 mg | Freq: Once | INTRAMUSCULAR | Status: AC
  Administered 2011-12-03: 40 mg via INTRAMUSCULAR

## 2011-12-03 MED ORDER — PREDNISONE 10 MG PO TABS: ORAL_TABLET | ORAL | Status: AC

## 2011-12-03 MED ORDER — HYDROCODONE-ACETAMINOPHEN 5-325 MG PO TABS: 1.0000 | ORAL_TABLET | Freq: Four times a day (QID) | ORAL | Status: DC | PRN

## 2011-12-03 NOTE — Progress Notes (Signed)
  Subjective:    Patient ID: Terri Mckee, female    DOB: [birth year], 71 y.o.   MRN: [id number]  HPI patient presents to clinic for evaluation of neck and arm pain. Notes three-day history of posterior cervical pain that radiates to the left upper arm. Also notes left hand numbness predominantly fourth and fifth fingers. No focal weakness injury or trauma. Symptoms worsened with movement. Taking no medication for the problem.  Past Medical History  Diagnosis Date  . Hypertension   . 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 History of cyst of breast   . 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 Colon polyps   . Elevated serum creatinine     with ACE inhibitor (Normal MRA of renal arteries)  . Arthritis     back  . GERD (gastroesophageal reflux disease)   . Cataract    Past Surgical History  Procedure Date  . Breast cyst excision     right breast  . Ovary surgery     right ovary & tube replacement  . Wrist ganglion excision     rt  . Appendectomy     reports that she quit smoking about 20 years ago. She has never used smokeless tobacco. She reports that she does not drink alcohol or use illicit drugs. family history includes Brain cancer in her mother and Other in her brother.  There is no history of Colon cancer. Allergies  Allergen Reactions  . Fexofenadine Shortness Of Breath  . Erythromycin Nausea Only  . Oxycodone-Acetaminophen Nausea And Vomiting  . Propoxyphene-Acetaminophen     REACTION: severe vomiting  . Simvastatin     REACTION: Myalgia     Review of Systems see history of present illness     Objective:   Physical Exam  Nursing note and vitals reviewed. Constitutional: She appears well-developed and well-nourished. No distress.  HENT:  Head: Normocephalic and atraumatic.  Musculoskeletal:       Mild tenderness to palpation posterior cervical neck approximately C7. No bony abnormality. Full  range of motion of neck. Full range of motion of left upper extremity. Distal 5 over 5 strength of intertriginous and MCP muscles. Wrist flexors intact.  Neurological: She is alert.  Skin: Skin is warm and dry. She is not diaphoretic.  Psychiatric: She has a normal mood and affect.          Assessment & Plan:

## 2011-12-03 NOTE — Assessment & Plan Note (Signed)
Obtain cervical radiograph. Administer Depo-Medrol injection. Begin prednisone taper. Take hydrocodone as needed for pain. Previous narcotic intolerances were nausea and vomiting without true allergic reaction. Followup closely in one week or sooner if necessary.

## 2011-12-03 NOTE — Addendum Note (Signed)
Addended by: Regis Bill on: 12/03/2011 05:56 PM   Modules accepted: Orders

## 2011-12-11 ENCOUNTER — Ambulatory Visit: Payer: Medicare Other | Admitting: Internal Medicine

## 2012-02-15 ENCOUNTER — Other Ambulatory Visit: Payer: Self-pay | Admitting: Internal Medicine

## 2012-03-01 ENCOUNTER — Ambulatory Visit: Payer: Medicare Other | Admitting: Family

## 2012-03-08 ENCOUNTER — Ambulatory Visit (INDEPENDENT_AMBULATORY_CARE_PROVIDER_SITE_OTHER): Payer: Medicare Other | Admitting: Family Medicine

## 2012-03-08 ENCOUNTER — Other Ambulatory Visit: Payer: Self-pay | Admitting: Family Medicine

## 2012-03-08 ENCOUNTER — Encounter: Payer: Self-pay | Admitting: Family Medicine

## 2012-03-08 VITALS — BP 110/70 | HR 65 | Temp 98.2°F | Ht 70.0 in | Wt 221.1 lb

## 2012-03-08 DIAGNOSIS — R198 Other specified symptoms and signs involving the digestive system and abdomen: Secondary | ICD-10-CM

## 2012-03-08 DIAGNOSIS — Z5189 Encounter for other specified aftercare: Secondary | ICD-10-CM

## 2012-03-08 DIAGNOSIS — K219 Gastro-esophageal reflux disease without esophagitis: Secondary | ICD-10-CM

## 2012-03-08 DIAGNOSIS — J309 Allergic rhinitis, unspecified: Secondary | ICD-10-CM

## 2012-03-08 DIAGNOSIS — E7800 Pure hypercholesterolemia, unspecified: Secondary | ICD-10-CM

## 2012-03-08 DIAGNOSIS — F458 Other somatoform disorders: Secondary | ICD-10-CM

## 2012-03-08 DIAGNOSIS — R490 Dysphonia: Secondary | ICD-10-CM

## 2012-03-08 DIAGNOSIS — R0989 Other specified symptoms and signs involving the circulatory and respiratory systems: Secondary | ICD-10-CM

## 2012-03-08 DIAGNOSIS — T7840XD Allergy, unspecified, subsequent encounter: Secondary | ICD-10-CM

## 2012-03-08 DIAGNOSIS — I10 Essential (primary) hypertension: Secondary | ICD-10-CM

## 2012-03-08 LAB — LIPID PANEL
HDL: 41 mg/dL (ref >39)
LDL Cholesterol: 93 mg/dL (ref 0–99)
Total CHOL/HDL Ratio: 3.8 Ratio
Triglycerides: 115 mg/dL (ref <150)

## 2012-03-08 LAB — BASIC METABOLIC PANEL
BUN: 17 mg/dL (ref 6–23)
Creat: 1.11 mg/dL — ABNORMAL HIGH (ref 0.50–1.10)
Potassium: 4.3 mEq/L (ref 3.5–5.3)

## 2012-03-08 LAB — HEPATIC FUNCTION PANEL
Bilirubin, Direct: 0.1 mg/dL (ref 0.0–0.3)
Indirect Bilirubin: 0.2 mg/dL (ref 0.0–0.9)
Total Bilirubin: 0.3 mg/dL (ref 0.3–1.2)
Total Protein: 7 g/dL (ref 6.0–8.3)

## 2012-03-08 LAB — CBC
HCT: 37.5 % (ref 36.0–46.0)
MCHC: 33.9 g/dL (ref 30.0–36.0)
MCV: 83.3 fL (ref 78.0–100.0)
Platelets: 263 10*3/uL (ref 150–400)
RDW: 14.8 % (ref 11.5–15.5)

## 2012-03-08 LAB — PHOSPHORUS: Phosphorus: 3.7 mg/dL (ref 2.3–4.6)

## 2012-03-08 LAB — TSH: TSH: 3.065 u[IU]/mL (ref 0.350–4.500)

## 2012-03-08 MED ORDER — LEVOCETIRIZINE DIHYDROCHLORIDE 5 MG PO TABS: 5.0000 mg | ORAL_TABLET | Freq: Every evening | ORAL | Status: DC

## 2012-03-10 ENCOUNTER — Encounter: Payer: Self-pay | Admitting: *Deleted

## 2012-03-11 ENCOUNTER — Other Ambulatory Visit: Payer: Self-pay | Admitting: Internal Medicine

## 2012-03-13 ENCOUNTER — Encounter: Payer: Self-pay | Admitting: Family Medicine

## 2012-03-13 DIAGNOSIS — R490 Dysphonia: Secondary | ICD-10-CM | POA: Insufficient documentation

## 2012-03-13 HISTORY — DX: Dysphonia: R49.0

## 2012-03-13 NOTE — Progress Notes (Signed)
Patient ID: Terri Mckee, female   DOB: [birth year], 72 y.o.   MRN: [id number] Terri Mckee [id number] [birth year] 03/13/2012      Progress Note-Follow Up  Subjective  Chief Complaint  Chief Complaint  Patient presents with  . Follow-up  . mucus and losing voice    X months- sometimes mucus is yellow sometimes its clear    HPI  Patient's age in female for followup he is complaint with the postnasal drip and globus. She says for about 6 or 7 months she's noted her voice is changing and is more hoarse. She seems her iron is having trouble with losing her voice. She can cough up phlegm at times which is clear yellow. She reports that is all usually controls her allergies fairly well but she's been having trouble getting greatly. Claritin, Zyrtec, Allegra did not work for her. No fevers or chills. No recent illness. No chest pain no palpitations, shortness of breath, GI or GU concerns otherwise noted today.  Past Medical History  Diagnosis Date  . Hypertension   . 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 History of cyst of breast   . 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 Colon polyps   . Elevated serum creatinine     with ACE inhibitor (Normal MRA of renal arteries)  . Arthritis     back  . GERD (gastroesophageal reflux disease)   . Cataract   . Hoarseness of voice 03/13/2012    Past Surgical History  Procedure Laterality Date  . Breast cyst excision      right breast  . Ovary surgery      right ovary & tube replacement  . Wrist ganglion excision      rt  . Appendectomy      Family History  Problem Relation Age of Onset  . Brain cancer Mother     deceased age 72  . Other Brother     died of sepsis  . Colon cancer Neg Hx     History   Social History  . Marital Status: Married    Spouse Name: N/A    Number of Children: N/A  . Years of Education: N/A   Occupational History  . Not on file.    Social History Main Topics  . Smoking status: Former Smoker    Quit date: 01/13/1991  . Smokeless tobacco: Never Used     Comment: quit 20 years ago 1/2 ppd x 40 years  . Alcohol Use: No  . Drug Use: No  . Sexually Active: Not on file   Other Topics Concern  . Not on file   Social History Narrative   Former Smoker - quit 20 years ago (1/2 ppd x 40 years)   Widowed   1 Daughter         Current Outpatient Prescriptions on File Prior to Visit  Medication Sig Dispense Refill  . amLODipine (NORVASC) 10 MG tablet TAKE 1 TABLET (10 MG TOTAL) BY MOUTH DAILY.  90 tablet  1  . aspirin 81 MG tablet Take 81 mg by mouth daily.        . betamethasone dipropionate (DIPROLENE) 0.05 % cream Apply topically 2 (two) times daily.  30 g  1  . budesonide-formoterol (SYMBICORT) 80-4.5 MCG/ACT inhaler Inhale 2 puffs into the lungs 2 (two) times daily.  1 Inhaler  6  . calcium carbonate (OS-CAL)  600 MG TABS Take 600 mg by mouth daily.        . cyclobenzaprine (FLEXERIL) 5 MG tablet Take 5 mg by mouth every 8 (eight) hours as needed.      . diclofenac sodium (VOLTAREN) 1 % GEL Apply 1 application topically 3 (three) times daily. Apply  2 grams three times a day to affected area as needed  100 g  4  . Garlic 300 MG CAPS Take 1 capsule by mouth daily.      . Ginseng (GIN-ZING PO) Take 1 tablet by mouth daily.      Marland Kitchen HYDROcodone-acetaminophen (NORCO) 5-325 MG per tablet Take 1 tablet by mouth every 6 (six) hours as needed for pain.  30 tablet  0  . Multiple Vitamin (MULTIVITAMIN) tablet Take 1 tablet by mouth daily.      . Omega-3 Fatty Acids (FISH OIL) 1200 MG CAPS Take 1,200 mg by mouth. 2 capsules by mouth once daily in the morning       . Omeprazole ([facility] OMEPRAZOLE) 20 MG TBEC Take 1 tablet (20 mg total) by mouth daily. 1 capsule by mouth once daily 30 minutes before meals  30 each  5  . pravastatin (PRAVACHOL) 40 MG tablet TAKE 1 TABLET (40 MG TOTAL) BY MOUTH DAILY.  90 tablet  1   No current  facility-administered medications on file prior to visit.    Allergies  Allergen Reactions  . Fexofenadine Shortness Of Breath  . Erythromycin Nausea Only  . Oxycodone-Acetaminophen Nausea And Vomiting  . Propoxyphene-Acetaminophen     REACTION: severe vomiting  . Simvastatin     REACTION: Myalgia    Review of Systems  Review of Systems  Constitutional: Negative for fever and malaise/fatigue.  HENT: Negative for congestion.   Eyes: Negative for discharge.  Respiratory: Negative for shortness of breath.   Cardiovascular: Negative for chest pain, palpitations and leg swelling.  Gastrointestinal: Negative for nausea, abdominal pain and diarrhea.  Genitourinary: Negative for dysuria.  Musculoskeletal: Negative for falls.  Skin: Negative for rash.  Neurological: Negative for loss of consciousness and headaches.  Endo/Heme/Allergies: Negative for polydipsia.  Psychiatric/Behavioral: Negative for depression and suicidal ideas. The patient is not nervous/anxious and does not have insomnia.     Objective  BP 110/70  Pulse 65  Temp(Src) 98.2 F (36.8 C) (Oral)  Ht 5\' 10"  (1.778 m)  Wt 221 lb 1.3 oz (100.281 kg)  BMI 31.72 kg/m2  SpO2 97%  Physical Exam  Physical Exam  Constitutional: She is oriented to person, place, and time and well-developed, well-nourished, and in no distress. No distress.  HENT:  Head: Normocephalic and atraumatic.  Eyes: Conjunctivae are normal.  Neck: Neck supple. No thyromegaly present.  Cardiovascular: Normal rate, regular rhythm and normal heart sounds.   No murmur heard. Pulmonary/Chest: Effort normal and breath sounds normal. She has no wheezes.  Abdominal: She exhibits no distension and no mass.  Musculoskeletal: She exhibits no edema.  Lymphadenopathy:    She has no cervical adenopathy.  Neurological: She is alert and oriented to person, place, and time.  Skin: Skin is warm and dry. No rash noted. She is not diaphoretic.  Psychiatric:  Memory, affect and judgment normal.    Lab Results  Component Value Date   TSH 3.065 03/08/2012   Lab Results  Component Value Date   WBC 6.0 03/08/2012   HGB 12.7 03/08/2012   HCT 37.5 03/08/2012   MCV 83.3 03/08/2012   PLT 263 03/08/2012  Lab Results  Component Value Date   CREATININE 1.11* 03/08/2012   BUN 17 03/08/2012   NA 143 03/08/2012   K 4.3 03/08/2012   CL 106 03/08/2012   CO2 27 03/08/2012   Lab Results  Component Value Date   ALT 17 03/08/2012   AST 18 03/08/2012   ALKPHOS 51 03/08/2012   BILITOT 0.3 03/08/2012   Lab Results  Component Value Date   CHOL 157 03/08/2012   Lab Results  Component Value Date   HDL 41 03/08/2012   Lab Results  Component Value Date   LDLCALC 93 03/08/2012   Lab Results  Component Value Date   TRIG 115 03/08/2012   Lab Results  Component Value Date   CHOLHDL 3.8 03/08/2012     Assessment & Plan  HYPERTENSION Well controlled no changes.  Hoarseness of voice With allergies and sense of globus. 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 Referred to ENT for evaluation  ALLERGIC RHINITIS Given refill on Xyzal.   GERD Patient reports good symptoms control on Omeprazole

## 2012-03-13 NOTE — Assessment & Plan Note (Signed)
Patient reports good symptoms control on Omeprazole

## 2012-03-13 NOTE — Assessment & Plan Note (Signed)
Well controlled no changes 

## 2012-03-13 NOTE — Assessment & Plan Note (Addendum)
Given refill on Xyzal.

## 2012-03-13 NOTE — Assessment & Plan Note (Signed)
With allergies and sense of globus. 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 Referred to ENT for evaluation

## 2012-04-01 ENCOUNTER — Ambulatory Visit (INDEPENDENT_AMBULATORY_CARE_PROVIDER_SITE_OTHER): Payer: Medicare Other | Admitting: Family

## 2012-04-01 ENCOUNTER — Encounter: Payer: Self-pay | Admitting: Family

## 2012-04-01 VITALS — BP 128/76 | HR 64 | Temp 98.5°F | Resp 18 | Wt 222.0 lb

## 2012-04-01 DIAGNOSIS — R059 Cough, unspecified: Secondary | ICD-10-CM

## 2012-04-01 DIAGNOSIS — R05 Cough: Secondary | ICD-10-CM

## 2012-04-01 MED ORDER — AZITHROMYCIN 250 MG PO TABS: ORAL_TABLET | ORAL | Status: DC

## 2012-04-01 MED ORDER — BENZONATATE 100 MG PO CAPS: 100.0000 mg | ORAL_CAPSULE | Freq: Three times a day (TID) | ORAL | Status: DC | PRN

## 2012-04-01 MED ORDER — FLUTICASONE PROPIONATE 50 MCG/ACT NA SUSP: 2.0000 | Freq: Every day | NASAL | Status: DC

## 2012-04-01 MED ORDER — AMLODIPINE BESYLATE 10 MG PO TABS: ORAL_TABLET | ORAL | Status: DC

## 2012-04-01 MED ORDER — CEFUROXIME AXETIL 250 MG PO TABS: 250.0000 mg | ORAL_TABLET | Freq: Two times a day (BID) | ORAL | Status: DC

## 2012-04-01 NOTE — Patient Instructions (Addendum)
Please start flonase and tessalon as needed. Continue Xyxal. If cough worsens or does not improve over the next 3 days, you can start the Z-pak. Call if symptoms are not improved in 1 week, or if symptoms worsen.

## 2012-04-01 NOTE — Progress Notes (Signed)
Subjective:    Patient ID: Terri Mckee, female    DOB: 1940/11/16, 72 y.o.   MRN: [id number]  HPI  Terri Mckee is a 72 yr old female who presents today with chief complaint of cough.  Cough started 1 week ago and is associated with sore throat.Eyes have been watering, + sneezing.  Nasal congestion is clear.  Did have some blood in nasal drainage this AM.  She has not tried any otc meds except dimetapp.  Denies associated fever. She is using xyzal.  She continues symbicort.    Review of Systems See HPI  Past Medical History  Diagnosis Date  . Hypertension   . 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 History of cyst of breast   . 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 Colon polyps   . Elevated serum creatinine     with ACE inhibitor (Normal MRA of renal arteries)  . Arthritis     back  . GERD (gastroesophageal reflux disease)   . Cataract   . Hoarseness of voice 03/13/2012    History   Social History  . Marital Status: Married    Spouse Name: N/A    Number of Children: N/A  . Years of Education: N/A   Occupational History  . Not on file.   Social History Main Topics  . Smoking status: Former Smoker    Quit date: 01/13/1991  . Smokeless tobacco: Never Used     Comment: quit 20 years ago 1/2 ppd x 40 years  . Alcohol Use: No  . Drug Use: No  . Sexually Active: Not on file   Other Topics Concern  . Not on file   Social History Narrative   Former Smoker - quit 20 years ago (1/2 ppd x 40 years)   Widowed   1 Daughter         Past Surgical History  Procedure Laterality Date  . Breast cyst excision      right breast  . Ovary surgery      right ovary & tube replacement  . Wrist ganglion excision      rt  . Appendectomy      Family History  Problem Relation Age of Onset  . Brain cancer Mother     deceased age 77  . Other Brother     died of sepsis  . Colon cancer Neg Hx     Allergies   Allergen Reactions  . Fexofenadine Shortness Of Breath  . Erythromycin Nausea Only  . Oxycodone-Acetaminophen Nausea And Vomiting  . Propoxyphene-Acetaminophen     REACTION: severe vomiting  . Simvastatin     REACTION: Myalgia    Current Outpatient Prescriptions on File Prior to Visit  Medication Sig Dispense Refill  . aspirin 81 MG tablet Take 81 mg by mouth daily.        . betamethasone dipropionate (DIPROLENE) 0.05 % cream Apply topically 2 (two) times daily.  30 g  1  . budesonide-formoterol (SYMBICORT) 80-4.5 MCG/ACT inhaler Inhale 2 puffs into the lungs 2 (two) times daily.  1 Inhaler  6  . calcium carbonate (OS-CAL) 600 MG TABS Take 600 mg by mouth daily.        . cyclobenzaprine (FLEXERIL) 5 MG tablet Take 5 mg by mouth every 8 (eight) hours as needed.      . diclofenac sodium (VOLTAREN) 1 % GEL Apply 1  application topically 3 (three) times daily. Apply  2 grams three times a day to affected area as needed  100 g  4  . Garlic 300 MG CAPS Take 1 capsule by mouth daily.      . Ginseng (GIN-ZING PO) Take 1 tablet by mouth daily.      Marland Kitchen HYDROcodone-acetaminophen (NORCO) 5-325 MG per tablet Take 1 tablet by mouth every 6 (six) hours as needed for pain.  30 tablet  0  . levocetirizine (XYZAL) 5 MG tablet Take 1 tablet (5 mg total) by mouth every evening. Failed Zyrtec, Claritin,  Allegra, and Levocetirizine generic  90 tablet  2  . Multiple Vitamin (MULTIVITAMIN) tablet Take 1 tablet by mouth daily.      . Omega-3 Fatty Acids (FISH OIL) 1200 MG CAPS Take 1,200 mg by mouth. 2 capsules by mouth once daily in the morning       . Omeprazole ([facility]S OMEPRAZOLE) 20 MG TBEC Take 1 tablet (20 mg total) by mouth daily. 1 capsule by mouth once daily 30 minutes before meals  30 each  5  . pravastatin (PRAVACHOL) 40 MG tablet TAKE 1 TABLET (40 MG TOTAL) BY MOUTH DAILY.  90 tablet  1   No current facility-administered medications on file prior to visit.    BP 128/76  Pulse 64  Temp(Src) 98.5 F  (36.9 C) (Oral)  Resp 18  Wt 222 lb (100.699 kg)  BMI 31.85 kg/m2  SpO2 99%       Objective:   Physical Exam  Constitutional: She appears well-developed and well-nourished. No distress.  HENT:  Head: Normocephalic and atraumatic.  Right Ear: Tympanic membrane and ear canal normal.  Left Ear: Tympanic membrane and ear canal normal.  Mouth/Throat: No oropharyngeal exudate or posterior oropharyngeal edema.  Cardiovascular: Normal rate and regular rhythm.   No murmur heard. Pulmonary/Chest: Effort normal and breath sounds normal. No respiratory distress. She has no wheezes. She has no rales. She exhibits no tenderness.          Assessment & Plan:

## 2012-04-01 NOTE — Assessment & Plan Note (Signed)
I think symptoms are mainly due to allergic rhinitis.  Will continue xyzal, add flonase. Tessalon prn cough. If cough worsens or if not improved in 2-3 days, pt instructed to start ceftin.

## 2012-06-09 ENCOUNTER — Encounter: Payer: Medicare Other | Admitting: Family Medicine

## 2012-07-07 ENCOUNTER — Encounter: Payer: Self-pay | Admitting: Family Medicine

## 2012-07-07 ENCOUNTER — Other Ambulatory Visit (HOSPITAL_COMMUNITY)
Admission: RE | Admit: 2012-07-07 | Discharge: 2012-07-07 | Disposition: A | Discharge status: Home / Self Care | Payer: Medicare Other | Source: Ambulatory Visit | Attending: Family Medicine | Admitting: Family Medicine

## 2012-07-07 ENCOUNTER — Ambulatory Visit (INDEPENDENT_AMBULATORY_CARE_PROVIDER_SITE_OTHER): Payer: Medicare Other | Admitting: Family Medicine

## 2012-07-07 ENCOUNTER — Telehealth: Payer: Self-pay | Admitting: Internal Medicine

## 2012-07-07 VITALS — BP 110/82 | HR 69 | Temp 98.2°F | Ht 70.0 in | Wt 223.0 lb

## 2012-07-07 DIAGNOSIS — I10 Essential (primary) hypertension: Secondary | ICD-10-CM

## 2012-07-07 DIAGNOSIS — R490 Dysphonia: Secondary | ICD-10-CM

## 2012-07-07 DIAGNOSIS — Z124 Encounter for screening for malignant neoplasm of cervix: Secondary | ICD-10-CM | POA: Insufficient documentation

## 2012-07-07 DIAGNOSIS — K219 Gastro-esophageal reflux disease without esophagitis: Secondary | ICD-10-CM

## 2012-07-07 DIAGNOSIS — L989 Disorder of the skin and subcutaneous tissue, unspecified: Secondary | ICD-10-CM

## 2012-07-07 DIAGNOSIS — R32 Unspecified urinary incontinence: Secondary | ICD-10-CM

## 2012-07-07 DIAGNOSIS — E785 Hyperlipidemia, unspecified: Secondary | ICD-10-CM

## 2012-07-07 HISTORY — DX: Encounter for screening for malignant neoplasm of cervix: Z12.4

## 2012-07-07 NOTE — Patient Instructions (Addendum)
Labs prior to visit lipid, renal, cbc, tsh, hgba1c, hepatic

## 2012-07-07 NOTE — Progress Notes (Signed)
Patient ID: Terri Mckee, female   DOB: 30-Oct-1940, 72 y.o.   MRN: [id number] Terri Mckee [id number] 04/09/1940 07/07/2012      Progress Note-Follow Up  Subjective  Chief Complaint  Chief Complaint  Patient presents with  . Annual Exam    physical  . Gynecologic Exam    pap and breast exam    HPI  Patient is a 72 year old American female in today for annual exam. She denies any GYN complaints today. Is noting worsening urinary incontinence over the last month or so. Denies any fevers or chills. No abdominal or back pain. No hematuria or flank pain. Has episodes of just feeling of episodes of falling comments. Also notes that irritated skin tag in her left anterior thigh and an irritated lesion on her forehead she's had for 2 years and lately become increasingly pruritic. No other recent illness. No chest pain or palpitations. No shortness of breath GI or GU concerns.  Past Medical History  Diagnosis Date  . Hypertension   . 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 History of cyst of breast   . 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 Colon polyps   . Elevated serum creatinine     with ACE inhibitor (Normal MRA of renal arteries)  . Arthritis     back  . GERD (gastroesophageal reflux disease)   . Cataract   . Hoarseness of voice 03/13/2012  . Cervical cancer screening 07/07/2012    Past Surgical History  Procedure Laterality Date  . Ovary surgery      right ovary & tube replacement  . Wrist ganglion excision      rt  . Appendectomy    . Breast cyst excision      right breast, twice    Family History  Problem Relation Age of Onset  . Brain cancer Mother     deceased age 5  . Alcohol abuse Mother   . Other Brother     died of sepsis  . Gout Brother   . Colon cancer Neg Hx   . Alcohol abuse Father   . Cancer Sister     throat  . Pulmonary embolism Daughter   . Mental illness Daughter    anxiety  . Diabetes Maternal Aunt   . Diabetes Maternal Aunt   . Seizures Maternal Aunt     History   Social History  . Marital Status: Married    Spouse Name: N/A    Number of Children: N/A  . Years of Education: N/A   Occupational History  . Not on file.   Social History Main Topics  . Smoking status: Former Smoker    Quit date: 01/13/1991  . Smokeless tobacco: Never Used     Comment: quit 20 years ago 1/2 ppd x 40 years  . Alcohol Use: No  . Drug Use: No  . Sexually Active: No   Other Topics Concern  . Not on file   Social History Narrative   Former Smoker - quit 20 years ago (1/2 ppd x 40 years)   Widowed   1 Daughter         Current Outpatient Prescriptions on File Prior to Visit  Medication Sig Dispense Refill  . amLODipine (NORVASC) 10 MG tablet TAKE 1 TABLET (10 MG TOTAL) BY MOUTH DAILY.  90 tablet  1  . aspirin 81 MG tablet Take 81  mg by mouth daily.        . budesonide-formoterol (SYMBICORT) 80-4.5 MCG/ACT inhaler Inhale 2 puffs into the lungs 2 (two) times daily.  1 Inhaler  6  . calcium carbonate (OS-CAL) 600 MG TABS Take 600 mg by mouth daily.        . diclofenac sodium (VOLTAREN) 1 % GEL Apply 1 application topically 3 (three) times daily. Apply  2 grams three times a day to affected area as needed  100 g  4  . fluticasone (FLONASE) 50 MCG/ACT nasal spray Place 2 sprays into the nose daily.  16 g  2  . Garlic 300 MG CAPS Take 1 capsule by mouth daily.      . Ginseng (GIN-ZING PO) Take 1 tablet by mouth daily.      Marland Kitchen levocetirizine (XYZAL) 5 MG tablet Take 1 tablet (5 mg total) by mouth every evening. Failed Zyrtec, Claritin,  Allegra, and Levocetirizine generic  90 tablet  2  . Multiple Vitamin (MULTIVITAMIN) tablet Take 1 tablet by mouth daily.      . Omega-3 Fatty Acids (FISH OIL) 1200 MG CAPS Take 1,200 mg by mouth. 2 capsules by mouth once daily in the morning       . Omeprazole ([facility]S OMEPRAZOLE) 20 MG TBEC Take 1 tablet (20 mg total) by mouth daily. 1  capsule by mouth once daily 30 minutes before meals  30 each  5  . pravastatin (PRAVACHOL) 40 MG tablet TAKE 1 TABLET (40 MG TOTAL) BY MOUTH DAILY.  90 tablet  1   No current facility-administered medications on file prior to visit.    Allergies  Allergen Reactions  . Fexofenadine Shortness Of Breath  . Erythromycin Nausea Only  . Oxycodone-Acetaminophen Nausea And Vomiting  . Propoxyphene-Acetaminophen     REACTION: severe vomiting  . Simvastatin     REACTION: Myalgia    Review of Systems  Review of Systems  Constitutional: Negative for fever, chills and malaise/fatigue.  HENT: Negative for hearing loss, nosebleeds and congestion.   Eyes: Negative for pain and discharge.  Respiratory: Negative for cough, sputum production, shortness of breath and wheezing.   Cardiovascular: Negative for chest pain, palpitations and leg swelling.  Gastrointestinal: Negative for heartburn, nausea, vomiting, abdominal pain, diarrhea, constipation and blood in stool.  Genitourinary: Positive for urgency and frequency. Negative for dysuria and hematuria.  Musculoskeletal: Negative for myalgias, back pain and falls.  Skin: Negative for rash.  Neurological: Negative for dizziness, tremors, sensory change, focal weakness, loss of consciousness, weakness and headaches.  Endo/Heme/Allergies: Negative for polydipsia. Does not bruise/bleed easily.  Psychiatric/Behavioral: Negative for depression and suicidal ideas. The patient is not nervous/anxious and does not have insomnia.     Objective  BP 110/82  Pulse 69  Temp(Src) 98.2 F (36.8 C) (Oral)  Ht 5\' 10"  (1.778 m)  Wt 223 lb 0.6 oz (101.17 kg)  BMI 32 kg/m2  SpO2 98%  Physical Exam  Physical Exam  Constitutional: She is oriented to person, place, and time and well-developed, well-nourished, and in no distress. No distress.  HENT:  Head: Normocephalic and atraumatic.  Right Ear: External ear normal.  Left Ear: External ear normal.  Nose:  Nose normal.  Mouth/Throat: Oropharynx is clear and moist. No oropharyngeal exudate.  Eyes: Conjunctivae are normal. Pupils are equal, round, and reactive to light. Right eye exhibits no discharge. Left eye exhibits no discharge. No scleral icterus.  Neck: Normal range of motion. Neck supple. No thyromegaly present.  Cardiovascular: Normal  rate, regular rhythm, normal heart sounds and intact distal pulses.  Exam reveals no gallop.   No murmur heard. Pulmonary/Chest: Effort normal and breath sounds normal. No respiratory distress. She has no wheezes. She has no rales.  Abdominal: Soft. Bowel sounds are normal. She exhibits no distension and no mass. There is no tenderness.  Genitourinary: Vagina normal, uterus normal, cervix normal, right adnexa normal and left adnexa normal. No vaginal discharge found.  Musculoskeletal: Normal range of motion. She exhibits no edema and no tenderness.  Lymphadenopathy:    She has no cervical adenopathy.  Neurological: She is alert and oriented to person, place, and time. She has normal reflexes. No cranial nerve deficit. Coordination normal.  Skin: Skin is warm and dry. No rash noted. She is not diaphoretic.  Large, pedunculated skin tag left interior thigh, dark, excoriated lesion on forehead, seborrhea throughout  Psychiatric: Mood, memory and affect normal.    Lab Results  Component Value Date   TSH 3.065 03/08/2012   Lab Results  Component Value Date   WBC 6.0 03/08/2012   HGB 12.7 03/08/2012   HCT 37.5 03/08/2012   MCV 83.3 03/08/2012   PLT 263 03/08/2012   Lab Results  Component Value Date   CREATININE 1.11* 03/08/2012   BUN 17 03/08/2012   NA 143 03/08/2012   K 4.3 03/08/2012   CL 106 03/08/2012   CO2 27 03/08/2012   Lab Results  Component Value Date   ALT 17 03/08/2012   AST 18 03/08/2012   ALKPHOS 51 03/08/2012   BILITOT 0.3 03/08/2012   Lab Results  Component Value Date   CHOL 157 03/08/2012   Lab Results  Component Value Date   HDL 41  03/08/2012   Lab Results  Component Value Date   LDLCALC 93 03/08/2012   Lab Results  Component Value Date   TRIG 115 03/08/2012   Lab Results  Component Value Date   CHOLHDL 3.8 03/08/2012     Assessment & Plan  Skin lesion Irritated skin tab inner left thigh and irritated seborrhea on forehead referred to dermatology for further management  HYPERTENSION Well controlled, no changes.   HYPERLIPIDEMIA Tolerating Pravastatin, good results, avoid trans fats.  GERD Well controlled on current meds no changes, avoid offending foods  Hoarseness of voice Improved at present  Urinary incontinence Recently worsened, UA unremarkable, will refer to urology  Cervical cancer screening Pap today, no concerns identified on exam

## 2012-07-07 NOTE — Telephone Encounter (Signed)
Labs prior to visit lipid, renal, cbc, tsh, hgba1c, hepatic   Patient has appointment on 01/06/13 and will be going to [facility] lab

## 2012-07-08 LAB — RENAL FUNCTION PANEL

## 2012-07-08 LAB — URINALYSIS
Hgb urine dipstick: NEGATIVE
Ketones, ur: NEGATIVE mg/dL
Nitrite: NEGATIVE
Specific Gravity, Urine: 1.02 (ref 1.005–1.030)
Urobilinogen, UA: 0.2 mg/dL (ref 0.0–1.0)

## 2012-07-08 LAB — URINE CULTURE: Organism ID, Bacteria: NO GROWTH

## 2012-07-10 ENCOUNTER — Encounter: Payer: Self-pay | Admitting: Family Medicine

## 2012-07-10 DIAGNOSIS — R32 Unspecified urinary incontinence: Secondary | ICD-10-CM

## 2012-07-10 DIAGNOSIS — L989 Disorder of the skin and subcutaneous tissue, unspecified: Secondary | ICD-10-CM

## 2012-07-10 HISTORY — DX: Disorder of the skin and subcutaneous tissue, unspecified: L98.9

## 2012-07-10 HISTORY — DX: Unspecified urinary incontinence: R32

## 2012-07-10 NOTE — Assessment & Plan Note (Signed)
Irritated skin tab inner left thigh and irritated seborrhea on forehead referred to dermatology for further management

## 2012-07-10 NOTE — Assessment & Plan Note (Signed)
Tolerating Pravastatin, good results, avoid trans fats.

## 2012-07-10 NOTE — Assessment & Plan Note (Signed)
Pap today, no concerns identified on exam

## 2012-07-10 NOTE — Assessment & Plan Note (Signed)
Well controlled on current meds no changes, avoid offending foods

## 2012-07-10 NOTE — Assessment & Plan Note (Addendum)
Improved at present. 

## 2012-07-10 NOTE — Assessment & Plan Note (Signed)
Recently worsened, UA unremarkable, will refer to urology

## 2012-07-10 NOTE — Assessment & Plan Note (Signed)
Well controlled, no changes 

## 2012-07-11 ENCOUNTER — Telehealth: Payer: Self-pay | Admitting: *Deleted

## 2012-07-11 ENCOUNTER — Other Ambulatory Visit: Payer: Self-pay | Admitting: Family Medicine

## 2012-07-11 ENCOUNTER — Telehealth: Payer: Self-pay

## 2012-07-11 DIAGNOSIS — R32 Unspecified urinary incontinence: Secondary | ICD-10-CM

## 2012-07-11 NOTE — Telephone Encounter (Signed)
Message copied by Regis Bill on Mon Jul 11, 2012 12:33 PM ------      Message from: Danise Edge A      Created: Fri Jul 08, 2012 12:35 PM       So urinalysis normal but renal not done, just because I do not have her back til December otherwise I do feel like I need that renal at her discretion in the next month ------

## 2012-07-11 NOTE — Telephone Encounter (Signed)
Pt voiced understanding.  Please proceed with referral

## 2012-07-11 NOTE — Telephone Encounter (Signed)
Patient informed, understood & agreed; lab order placed/SLS  

## 2012-07-11 NOTE — Telephone Encounter (Signed)
Message copied by Court Joy on Mon Jul 11, 2012  4:52 PM ------      Message from: Danise Edge A      Created: Sun Jul 10, 2012  9:26 AM       Her UA was negative so will refer to urology for incontinence if she agrees ------

## 2012-07-15 ENCOUNTER — Other Ambulatory Visit: Payer: Self-pay | Admitting: Internal Medicine

## 2012-07-18 ENCOUNTER — Telehealth: Payer: Self-pay | Admitting: Internal Medicine

## 2012-07-18 DIAGNOSIS — T7840XD Allergy, unspecified, subsequent encounter: Secondary | ICD-10-CM

## 2012-07-18 MED ORDER — PRAVASTATIN SODIUM 40 MG PO TABS: 40.0000 mg | ORAL_TABLET | Freq: Every day | ORAL | Status: DC

## 2012-07-18 MED ORDER — LEVOCETIRIZINE DIHYDROCHLORIDE 5 MG PO TABS: 5.0000 mg | ORAL_TABLET | Freq: Every evening | ORAL | Status: DC

## 2012-07-18 NOTE — Telephone Encounter (Signed)
PRAVACHOL 40 MG TAKE 1 TABLET PER DAY QTY 30  XYZAL 10 MG TAKE 1 PER DAY QTY 30

## 2012-07-19 ENCOUNTER — Other Ambulatory Visit: Payer: Self-pay | Admitting: Internal Medicine

## 2012-07-19 LAB — RENAL FUNCTION PANEL
Albumin: 4 g/dL (ref 3.5–5.2)
CO2: 27 mEq/L (ref 19–32)
Chloride: 106 mEq/L (ref 96–112)
Creat: 1.14 mg/dL — ABNORMAL HIGH (ref 0.50–1.10)
Glucose, Bld: 86 mg/dL (ref 70–99)
Sodium: 142 mEq/L (ref 135–145)

## 2012-07-19 NOTE — Telephone Encounter (Signed)
Denied-Rx sent to pharmacy 07.07.14, #[id number]/SLS

## 2012-08-23 ENCOUNTER — Other Ambulatory Visit: Payer: Self-pay

## 2012-10-12 ENCOUNTER — Other Ambulatory Visit: Payer: Self-pay | Admitting: Family Medicine

## 2012-11-19 ENCOUNTER — Other Ambulatory Visit: Payer: Self-pay | Admitting: Family Medicine

## 2012-11-21 NOTE — Telephone Encounter (Signed)
Rx request to pharmacy/SLS  

## 2012-11-28 ENCOUNTER — Ambulatory Visit (INDEPENDENT_AMBULATORY_CARE_PROVIDER_SITE_OTHER): Payer: Medicare Other | Admitting: Family Medicine

## 2012-11-28 ENCOUNTER — Encounter: Payer: Self-pay | Admitting: Family Medicine

## 2012-11-28 VITALS — BP 108/72 | HR 72 | Temp 98.0°F | Ht 70.0 in | Wt 223.1 lb

## 2012-11-28 DIAGNOSIS — J209 Acute bronchitis, unspecified: Secondary | ICD-10-CM

## 2012-11-28 DIAGNOSIS — M545 Low back pain, unspecified: Secondary | ICD-10-CM

## 2012-11-28 DIAGNOSIS — J309 Allergic rhinitis, unspecified: Secondary | ICD-10-CM

## 2012-11-28 DIAGNOSIS — I10 Essential (primary) hypertension: Secondary | ICD-10-CM

## 2012-11-28 MED ORDER — CEFDINIR 300 MG PO CAPS: 300.0000 mg | ORAL_CAPSULE | Freq: Two times a day (BID) | ORAL | Status: AC

## 2012-11-28 MED ORDER — BACLOFEN 10 MG PO TABS: 10.0000 mg | ORAL_TABLET | Freq: Three times a day (TID) | ORAL | Status: DC | PRN

## 2012-11-28 MED ORDER — TRAMADOL HCL 50 MG PO TABS: 50.0000 mg | ORAL_TABLET | Freq: Two times a day (BID) | ORAL | Status: DC | PRN

## 2012-11-28 MED ORDER — BENZONATATE 100 MG PO CAPS: 100.0000 mg | ORAL_CAPSULE | Freq: Three times a day (TID) | ORAL | Status: DC | PRN

## 2012-11-28 NOTE — Progress Notes (Signed)
Pre visit review using our clinic review tool, if applicable. No additional management support is needed unless otherwise documented below in the visit note. 

## 2012-11-28 NOTE — Patient Instructions (Signed)
  Start a probiotic daily such as Digestive Advantage or align Increase fluids Try pain patches such as Salon pas to back Breakthrough pain Tylenol/Acetaminophen  Bronchitis Bronchitis is the body's way of reacting to injury and/or infection (inflammation) of the bronchi. Bronchi are the air tubes that extend from the windpipe into the lungs. If the inflammation becomes severe, it may cause shortness of breath. CAUSES  Inflammation may be caused by:  A virus.  Germs (bacteria).  Dust.  Allergens.  Pollutants and many other irritants. The cells lining the bronchial tree are covered with tiny hairs (cilia). These constantly beat upward, away from the lungs, toward the mouth. This keeps the lungs free of pollutants. When these cells become too irritated and are unable to do their job, mucus begins to develop. This causes the characteristic cough of bronchitis. The cough clears the lungs when the cilia are unable to do their job. Without either of these protective mechanisms, the mucus would settle in the lungs. Then you would develop pneumonia. Smoking is a common cause of bronchitis and can contribute to pneumonia. Stopping this habit is the single most important thing you can do to help yourself. TREATMENT   Your caregiver may prescribe an antibiotic if the cough is caused by bacteria. Also, medicines that open up your airways make it easier to breathe. Your caregiver may also recommend or prescribe an expectorant. It will loosen the mucus to be coughed up. Only take over-the-counter or prescription medicines for pain, discomfort, or fever as directed by your caregiver.  Removing whatever causes the problem (smoking, for example) is critical to preventing the problem from getting worse.  Cough suppressants may be prescribed for relief of cough symptoms.  Inhaled medicines may be prescribed to help with symptoms now and to help prevent problems from returning.  For those with recurrent  (chronic) bronchitis, there may be a need for steroid medicines. SEEK IMMEDIATE MEDICAL CARE IF:   During treatment, you develop more pus-like mucus (purulent sputum).  You have a fever.  You become progressively more ill.  You have increased difficulty breathing, wheezing, or shortness of breath. It is necessary to seek immediate medical care if you are elderly or sick from any other disease. MAKE SURE YOU:   Understand these instructions.  Will watch your condition.  Will get help right away if you are not doing well or get worse. Document Released: 12/29/2004 Document Revised: 08/31/2012 Document Reviewed: 08/23/2012 [facility] Patient Information 2014 [place], [place].

## 2012-12-03 DIAGNOSIS — J209 Acute bronchitis, unspecified: Secondary | ICD-10-CM | POA: Insufficient documentation

## 2012-12-03 DIAGNOSIS — M545 Low back pain, unspecified: Secondary | ICD-10-CM | POA: Insufficient documentation

## 2012-12-03 NOTE — Assessment & Plan Note (Signed)
Well controlled, no changes 

## 2012-12-03 NOTE — Assessment & Plan Note (Signed)
Encouraged Zyrtec and Flonase

## 2012-12-03 NOTE — Assessment & Plan Note (Signed)
Started on [employer], antibiotics and probiotics, increase rest and hydration. Add Mucinex

## 2012-12-03 NOTE — Progress Notes (Signed)
Patient ID: Terri Mckee, female   DOB: 06-20-1940, 72 y.o.   MRN: [id number] Terri Mckee [id number] 07-Jan-1941 12/03/2012      Progress Note-Follow Up  Subjective  Chief Complaint  Chief Complaint  Patient presents with  . Cough    X 1 week w/ phlegm- sometimes yellow and sometimes its clear    HPI  Patient is a 71 year old Caucasian female who is in today for evaluation of a cough. It has been present about a week. It is productive of yellow phlegm. She has had congestion and chest congestion. She has some mild chest pain with coughing but no other palpitations or shortness of breath. No fevers and chills. No headache, ear pain or sore throat. Does have some nasal congestion. She is a nonsmoker. He is taking medications as prescribed  Past Medical History  Diagnosis Date  . Hypertension   . 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 History of cyst of breast   . 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 Colon polyps   . Elevated serum creatinine     with ACE inhibitor (Normal MRA of renal arteries)  . Arthritis     back  . GERD (gastroesophageal reflux disease)   . Cataract   . Hoarseness of voice 03/13/2012  . Cervical cancer screening 07/07/2012  . Skin lesion 07/10/2012  . Urinary incontinence 07/10/2012    Past Surgical History  Procedure Laterality Date  . Ovary surgery      right ovary & tube replacement  . Wrist ganglion excision      rt  . Appendectomy    . Breast cyst excision      right breast, twice    Family History  Problem Relation Age of Onset  . Brain cancer Mother     deceased age 8  . Alcohol abuse Mother   . Other Brother     died of sepsis  . Gout Brother   . Colon cancer Neg Hx   . Alcohol abuse Father   . Cancer Sister     throat  . Pulmonary embolism Daughter   . Mental illness Daughter     anxiety  . Diabetes Maternal Aunt   . Diabetes Maternal Aunt   . Seizures  Maternal Aunt     History   Social History  . Marital Status: Married    Spouse Name: N/A    Number of Children: N/A  . Years of Education: N/A   Occupational History  . Not on file.   Social History Main Topics  . Smoking status: Former Smoker    Quit date: 01/13/1991  . Smokeless tobacco: Never Used     Comment: quit 20 years ago 1/2 ppd x 40 years  . Alcohol Use: No  . Drug Use: No  . Sexual Activity: No   Other Topics Concern  . Not on file   Social History Narrative   Former Smoker - quit 20 years ago (1/2 ppd x 40 years)   Widowed   1 Daughter         Current Outpatient Prescriptions on File Prior to Visit  Medication Sig Dispense Refill  . amLODipine (NORVASC) 10 MG tablet TAKE 1 TABLET (10 MG TOTAL) BY MOUTH DAILY.  90 tablet  1  . aspirin 81 MG tablet Take 81 mg by mouth daily.        Marland Kitchen  budesonide-formoterol (SYMBICORT) 80-4.5 MCG/ACT inhaler Inhale 2 puffs into the lungs 2 (two) times daily.  1 Inhaler  6  . calcium carbonate (OS-CAL) 600 MG TABS Take 600 mg by mouth daily.        . diclofenac sodium (VOLTAREN) 1 % GEL Apply 1 application topically 3 (three) times daily. Apply  2 grams three times a day to affected area as needed  100 g  4  . fluticasone (FLONASE) 50 MCG/ACT nasal spray Place 2 sprays into the nose daily.  16 g  2  . Garlic 300 MG CAPS Take 1 capsule by mouth daily.      . Ginseng (GIN-ZING PO) Take 1 tablet by mouth daily.      Marland Kitchen levocetirizine (XYZAL) 5 MG tablet TAKE 1 TABLET BY MOUTH EVERY EVENING  90 tablet  0  . Multiple Vitamin (MULTIVITAMIN) tablet Take 1 tablet by mouth daily.      . Omega-3 Fatty Acids (FISH OIL) 1200 MG CAPS Take 1,200 mg by mouth. 2 capsules by mouth once daily in the morning       . Omeprazole ([employer]S OMEPRAZOLE) 20 MG TBEC Take 1 tablet (20 mg total) by mouth daily. 1 capsule by mouth once daily 30 minutes before meals  30 each  5  . pravastatin (PRAVACHOL) 40 MG tablet TAKE 1 TABLET (40 MG TOTAL) BY MOUTH DAILY.  90  tablet  0   No current facility-administered medications on file prior to visit.    Allergies  Allergen Reactions  . Fexofenadine Shortness Of Breath  . Erythromycin Nausea Only  . Oxycodone-Acetaminophen Nausea And Vomiting  . Propoxyphene-Acetaminophen     REACTION: severe vomiting  . Simvastatin     REACTION: Myalgia    Review of Systems  Review of Systems  Constitutional: Negative for fever and malaise/fatigue.  HENT: Positive for congestion.   Eyes: Negative for discharge.  Respiratory: Positive for cough and sputum production. Negative for shortness of breath.   Cardiovascular: Positive for chest pain. Negative for palpitations and leg swelling.  Gastrointestinal: Negative for heartburn, nausea, abdominal pain and diarrhea.  Genitourinary: Negative for dysuria.  Musculoskeletal: Positive for back pain. Negative for falls.  Skin: Negative for rash.  Neurological: Positive for headaches. Negative for loss of consciousness.  Endo/Heme/Allergies: Negative for polydipsia.  Psychiatric/Behavioral: Negative for depression and suicidal ideas. The patient is not nervous/anxious and does not have insomnia.     Objective  BP 108/72  Pulse 72  Temp(Src) 98 F (36.7 C) (Oral)  Ht 5\' 10"  (1.778 m)  Wt 223 lb 1.9 oz (101.207 kg)  BMI 32.01 kg/m2  SpO2 91%  Physical Exam  Physical Exam  Constitutional: She is oriented to person, place, and time and well-developed, well-nourished, and in no distress. No distress.  HENT:  Head: Normocephalic and atraumatic.  Eyes: Conjunctivae are normal.  Neck: Neck supple. No thyromegaly present.  Cardiovascular: Normal rate, regular rhythm and normal heart sounds.   No murmur heard. Pulmonary/Chest: Effort normal and breath sounds normal. She has no wheezes.  Decreased breath sounds b/l bases  Abdominal: Soft. Bowel sounds are normal. She exhibits no distension and no mass.  Musculoskeletal: She exhibits no edema.  Lymphadenopathy:     She has no cervical adenopathy.  Neurological: She is alert and oriented to person, place, and time.  Skin: Skin is warm and dry. No rash noted. She is not diaphoretic.  Psychiatric: Memory, affect and judgment normal.    Lab Results  Component Value  Date   TSH 3.065 03/08/2012   Lab Results  Component Value Date   WBC 6.0 03/08/2012   HGB 12.7 03/08/2012   HCT 37.5 03/08/2012   MCV 83.3 03/08/2012   PLT 263 03/08/2012   Lab Results  Component Value Date   CREATININE 1.14* 07/18/2012   BUN 20 07/18/2012   NA 142 07/18/2012   K 4.2 07/18/2012   CL 106 07/18/2012   CO2 27 07/18/2012   Lab Results  Component Value Date   ALT 17 03/08/2012   AST 18 03/08/2012   ALKPHOS 51 03/08/2012   BILITOT 0.3 03/08/2012   Lab Results  Component Value Date   CHOL 157 03/08/2012   Lab Results  Component Value Date   HDL 41 03/08/2012   Lab Results  Component Value Date   LDLCALC 93 03/08/2012   Lab Results  Component Value Date   TRIG 115 03/08/2012   Lab Results  Component Value Date   CHOLHDL 3.8 03/08/2012     Assessment & Plan  ALLERGIC RHINITIS Encouraged Zyrtec and Flonase  HYPERTENSION Well controlled, no changes  Acute bronchitis Started on Tessalon Perles, antibiotics and probiotics, increase rest and hydration. Add Mucinex

## 2013-01-06 ENCOUNTER — Ambulatory Visit (INDEPENDENT_AMBULATORY_CARE_PROVIDER_SITE_OTHER): Payer: Medicare Other | Admitting: Family Medicine

## 2013-01-06 ENCOUNTER — Encounter: Payer: Self-pay | Admitting: Family Medicine

## 2013-01-06 VITALS — BP 100/70 | HR 66 | Temp 97.9°F | Ht 70.0 in | Wt 222.0 lb

## 2013-01-06 DIAGNOSIS — I10 Essential (primary) hypertension: Secondary | ICD-10-CM

## 2013-01-06 DIAGNOSIS — E785 Hyperlipidemia, unspecified: Secondary | ICD-10-CM

## 2013-01-06 DIAGNOSIS — J209 Acute bronchitis, unspecified: Secondary | ICD-10-CM

## 2013-01-06 NOTE — Patient Instructions (Signed)

## 2013-01-06 NOTE — Progress Notes (Signed)
Pre visit review using our clinic review tool, if applicable. No additional management support is needed unless otherwise documented below in the visit note. 

## 2013-01-08 ENCOUNTER — Encounter: Payer: Self-pay | Admitting: Family Medicine

## 2013-01-08 NOTE — Assessment & Plan Note (Signed)
resolved 

## 2013-01-08 NOTE — Assessment & Plan Note (Signed)
Well controlled, no changes, encouraged DASH diet 

## 2013-01-08 NOTE — Assessment & Plan Note (Signed)
Avoid trans fats, increase exercise as tolerated

## 2013-01-08 NOTE — Progress Notes (Signed)
Patient ID: Terri Mckee, female   DOB: 12-Nov-1940, 72 y.o.   MRN: [id number] CHERESE LOZANO [id number] 07-Jul-1940 01/08/2013      Progress Note-Follow Up  Subjective  Chief Complaint  Chief Complaint  Patient presents with  . Follow-up    6 month    HPI  Patient is a 72 year old [place] American female who is in today for followup. She reports doing well. No recent illness. No chest pain, palpitations or shortness of breath. No GI or GU concerns. Is taking medications as prescribed. Heartburn well-controlled with dietary changes  Past Medical History  Diagnosis Date  . Hypertension   . 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 History of cyst of breast   . 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 Colon polyps   . Elevated serum creatinine     with ACE inhibitor (Normal MRA of renal arteries)  . Arthritis     back  . GERD (gastroesophageal reflux disease)   . Cataract   . Hoarseness of voice 03/13/2012  . Cervical cancer screening 07/07/2012  . Skin lesion 07/10/2012  . Urinary incontinence 07/10/2012    Past Surgical History  Procedure Laterality Date  . Ovary surgery      right ovary & tube replacement  . Wrist ganglion excision      rt  . Appendectomy    . Breast cyst excision      right breast, twice    Family History  Problem Relation Age of Onset  . Brain cancer Mother     deceased age 42  . Alcohol abuse Mother   . Other Brother     died of sepsis  . Gout Brother   . Colon cancer Neg Hx   . Alcohol abuse Father   . Cancer Sister     throat  . Pulmonary embolism Daughter   . Mental illness Daughter     anxiety  . Diabetes Maternal Aunt   . Diabetes Maternal Aunt   . Seizures Maternal Aunt     History   Social History  . Marital Status: Married    Spouse Name: N/A    Number of Children: N/A  . Years of Education: N/A   Occupational History  . Not on file.   Social History  Main Topics  . Smoking status: Former Smoker    Quit date: 01/13/1991  . Smokeless tobacco: Never Used     Comment: quit 20 years ago 1/2 ppd x 40 years  . Alcohol Use: No  . Drug Use: No  . Sexual Activity: No   Other Topics Concern  . Not on file   Social History Narrative   Former Smoker - quit 20 years ago (1/2 ppd x 40 years)   Widowed   1 Daughter         Current Outpatient Prescriptions on File Prior to Visit  Medication Sig Dispense Refill  . amLODipine (NORVASC) 10 MG tablet TAKE 1 TABLET (10 MG TOTAL) BY MOUTH DAILY.  90 tablet  1  . aspirin 81 MG tablet Take 81 mg by mouth daily.        . baclofen (LIORESAL) 10 MG tablet Take 1 tablet (10 mg total) by mouth 3 (three) times daily as needed for muscle spasms.  40 each  1  . benzonatate (TESSALON) 100 MG capsule Take 1 capsule (100 mg total) by mouth  3 (three) times daily as needed for cough.  40 capsule  1  . budesonide-formoterol (SYMBICORT) 80-4.5 MCG/ACT inhaler Inhale 2 puffs into the lungs 2 (two) times daily.  1 Inhaler  6  . calcium carbonate (OS-CAL) 600 MG TABS Take 600 mg by mouth daily.        . diclofenac sodium (VOLTAREN) 1 % GEL Apply 1 application topically 3 (three) times daily. Apply  2 grams three times a day to affected area as needed  100 g  4  . fluticasone (FLONASE) 50 MCG/ACT nasal spray Place 2 sprays into the nose daily.  16 g  2  . Garlic 300 MG CAPS Take 1 capsule by mouth daily.      . Ginseng (GIN-ZING PO) Take 1 tablet by mouth daily.      Marland Kitchen levocetirizine (XYZAL) 5 MG tablet TAKE 1 TABLET BY MOUTH EVERY EVENING  90 tablet  0  . Multiple Vitamin (MULTIVITAMIN) tablet Take 1 tablet by mouth daily.      . Omega-3 Fatty Acids (FISH OIL) 1200 MG CAPS Take 1,200 mg by mouth. 2 capsules by mouth once daily in the morning       . Omeprazole ([employer]S OMEPRAZOLE) 20 MG TBEC Take 1 tablet (20 mg total) by mouth daily. 1 capsule by mouth once daily 30 minutes before meals  30 each  5  . pravastatin  (PRAVACHOL) 40 MG tablet TAKE 1 TABLET (40 MG TOTAL) BY MOUTH DAILY.  90 tablet  0  . traMADol (ULTRAM) 50 MG tablet Take 1 tablet (50 mg total) by mouth every 12 (twelve) hours as needed.  30 tablet  1   No current facility-administered medications on file prior to visit.    Allergies  Allergen Reactions  . Fexofenadine Shortness Of Breath  . Erythromycin Nausea Only  . Oxycodone-Acetaminophen Nausea And Vomiting  . Propoxyphene N-Acetaminophen     REACTION: severe vomiting  . Simvastatin     REACTION: Myalgia    Review of Systems  Review of Systems  Constitutional: Negative for fever and malaise/fatigue.  HENT: Negative for congestion.   Eyes: Negative for discharge.  Respiratory: Negative for shortness of breath.   Cardiovascular: Negative for chest pain, palpitations and leg swelling.  Gastrointestinal: Negative for nausea, abdominal pain and diarrhea.  Genitourinary: Negative for dysuria.  Musculoskeletal: Negative for falls.  Skin: Negative for rash.  Neurological: Negative for loss of consciousness and headaches.  Endo/Heme/Allergies: Negative for polydipsia.  Psychiatric/Behavioral: Negative for depression and suicidal ideas. The patient is not nervous/anxious and does not have insomnia.     Objective  BP 100/70  Pulse 66  Temp(Src) 97.9 F (36.6 C) (Oral)  Ht 5\' 10"  (1.778 m)  Wt 222 lb (100.699 kg)  BMI 31.85 kg/m2  SpO2 97%  Physical Exam  Physical Exam  Constitutional: She is oriented to person, place, and time and well-developed, well-nourished, and in no distress. No distress.  HENT:  Head: Normocephalic and atraumatic.  Eyes: Conjunctivae are normal.  Neck: Neck supple. No thyromegaly present.  Cardiovascular: Normal rate and regular rhythm.   Murmur heard. Pulmonary/Chest: Effort normal and breath sounds normal. She has no wheezes.  Abdominal: She exhibits no distension and no mass.  Musculoskeletal: She exhibits no edema.  Lymphadenopathy:     She has no cervical adenopathy.  Neurological: She is alert and oriented to person, place, and time.  Skin: Skin is warm and dry. No rash noted. She is not diaphoretic.  Psychiatric: Memory, affect  and judgment normal.    Lab Results  Component Value Date   TSH 3.065 03/08/2012   Lab Results  Component Value Date   WBC 6.0 03/08/2012   HGB 12.7 03/08/2012   HCT 37.5 03/08/2012   MCV 83.3 03/08/2012   PLT 263 03/08/2012   Lab Results  Component Value Date   CREATININE 1.14* 07/18/2012   BUN 20 07/18/2012   NA 142 07/18/2012   K 4.2 07/18/2012   CL 106 07/18/2012   CO2 27 07/18/2012   Lab Results  Component Value Date   ALT 17 03/08/2012   AST 18 03/08/2012   ALKPHOS 51 03/08/2012   BILITOT 0.3 03/08/2012   Lab Results  Component Value Date   CHOL 157 03/08/2012   Lab Results  Component Value Date   HDL 41 03/08/2012   Lab Results  Component Value Date   LDLCALC 93 03/08/2012   Lab Results  Component Value Date   TRIG 115 03/08/2012   Lab Results  Component Value Date   CHOLHDL 3.8 03/08/2012     Assessment & Plan  HYPERTENSION Well controlled,no changes, encouraged DASH diet  Acute bronchitis resolved  HYPERLIPIDEMIA Avoid trans fats, increase exercise as tolerated

## 2013-02-16 ENCOUNTER — Other Ambulatory Visit: Payer: Self-pay | Admitting: Internal Medicine

## 2013-03-25 ENCOUNTER — Other Ambulatory Visit: Payer: Self-pay | Admitting: Family Medicine

## 2013-07-02 ENCOUNTER — Other Ambulatory Visit: Payer: Self-pay | Admitting: Family Medicine

## 2013-07-03 NOTE — Telephone Encounter (Signed)
Refill sent per [facility] refill protocol/SLS  

## 2013-08-02 ENCOUNTER — Other Ambulatory Visit: Payer: Self-pay | Admitting: Family Medicine

## 2013-08-02 NOTE — Telephone Encounter (Signed)
Pt last seen 2014 advised f/u in 04/2013 pt is past due for visit.  90 day supply sent to Pharm[facility]Plz contact Pt to set up visit before any other refills can be sent out

## 2013-08-03 NOTE — Telephone Encounter (Signed)
Appointment scheduled for 09/21/13

## 2013-09-21 ENCOUNTER — Encounter: Payer: Self-pay | Admitting: Family Medicine

## 2013-09-21 ENCOUNTER — Encounter (HOSPITAL_BASED_OUTPATIENT_CLINIC_OR_DEPARTMENT_OTHER): Payer: Self-pay

## 2013-09-21 ENCOUNTER — Ambulatory Visit (HOSPITAL_BASED_OUTPATIENT_CLINIC_OR_DEPARTMENT_OTHER)
Admission: RE | Admit: 2013-09-21 | Discharge: 2013-09-21 | Disposition: A | Discharge status: Home / Self Care | Payer: Medicare Other | Source: Ambulatory Visit | Attending: Family Medicine | Admitting: Family Medicine

## 2013-09-21 ENCOUNTER — Ambulatory Visit (INDEPENDENT_AMBULATORY_CARE_PROVIDER_SITE_OTHER): Payer: Medicare Other | Admitting: Family Medicine

## 2013-09-21 VITALS — BP 115/82 | HR 96 | Temp 99.0°F | Ht 70.0 in | Wt 223.0 lb

## 2013-09-21 DIAGNOSIS — M25569 Pain in unspecified knee: Secondary | ICD-10-CM | POA: Insufficient documentation

## 2013-09-21 DIAGNOSIS — M8580 Other specified disorders of bone density and structure, unspecified site: Secondary | ICD-10-CM

## 2013-09-21 DIAGNOSIS — I10 Essential (primary) hypertension: Secondary | ICD-10-CM | POA: Diagnosis not present

## 2013-09-21 DIAGNOSIS — M25469 Effusion, unspecified knee: Secondary | ICD-10-CM | POA: Insufficient documentation

## 2013-09-21 DIAGNOSIS — E782 Mixed hyperlipidemia: Secondary | ICD-10-CM

## 2013-09-21 DIAGNOSIS — Z23 Encounter for immunization: Secondary | ICD-10-CM

## 2013-09-21 DIAGNOSIS — K219 Gastro-esophageal reflux disease without esophagitis: Secondary | ICD-10-CM

## 2013-09-21 DIAGNOSIS — M899 Disorder of bone, unspecified: Secondary | ICD-10-CM

## 2013-09-21 DIAGNOSIS — M949 Disorder of cartilage, unspecified: Secondary | ICD-10-CM

## 2013-09-21 DIAGNOSIS — E785 Hyperlipidemia, unspecified: Secondary | ICD-10-CM

## 2013-09-21 DIAGNOSIS — M25561 Pain in right knee: Secondary | ICD-10-CM

## 2013-09-21 DIAGNOSIS — G4700 Insomnia, unspecified: Secondary | ICD-10-CM

## 2013-09-21 NOTE — Patient Instructions (Signed)

## 2013-09-21 NOTE — Progress Notes (Signed)
Pre visit review using our clinic review tool, if applicable. No additional management support is needed unless otherwise documented below in the visit note. 

## 2013-09-22 LAB — RENAL FUNCTION PANEL
Albumin: 3.7 g/dL (ref 3.5–5.2)
BUN: 20 mg/dL (ref 6–23)
CO2: 28 mEq/L (ref 19–32)
CREATININE: 1.4 mg/dL — AB (ref 0.4–1.2)
Calcium: 8.9 mg/dL (ref 8.4–10.5)
Chloride: 105 mEq/L (ref 96–112)
GFR: 48.93 mL/min — AB (ref >60.00)
GLUCOSE: 82 mg/dL (ref 70–99)
PHOSPHORUS: 4.1 mg/dL (ref 2.3–4.6)
Potassium: 3.7 mEq/L (ref 3.5–5.1)
Sodium: 139 mEq/L (ref 135–145)

## 2013-09-22 LAB — CBC
HCT: 37.8 % (ref 36.0–46.0)
Hemoglobin: 12.7 g/dL (ref 12.0–15.0)
MCHC: 33.6 g/dL (ref 30.0–36.0)
MCV: 86.6 fl (ref 78.0–100.0)
Platelets: 205 10*3/uL (ref 150.0–400.0)
RBC: 4.36 Mil/uL (ref 3.87–5.11)
RDW: 14.7 % (ref 11.5–15.5)
WBC: 6.7 10*3/uL (ref 4.0–10.5)

## 2013-09-22 LAB — HEPATIC FUNCTION PANEL
ALT: 15 U/L (ref 0–35)
AST: 18 U/L (ref 0–37)
Albumin: 3.7 g/dL (ref 3.5–5.2)
Alkaline Phosphatase: 55 U/L (ref 39–117)
Bilirubin, Direct: 0.1 mg/dL (ref 0.0–0.3)
TOTAL PROTEIN: 7.4 g/dL (ref 6.0–8.3)
Total Bilirubin: 0.5 mg/dL (ref 0.2–1.2)

## 2013-09-22 LAB — LIPID PANEL
CHOLESTEROL: 161 mg/dL (ref 0–200)
HDL: 35.5 mg/dL — AB (ref >39.00)
LDL Cholesterol: 106 mg/dL — ABNORMAL HIGH (ref 0–99)
NONHDL: 125.5
TRIGLYCERIDES: 97 mg/dL (ref 0.0–149.0)
Total CHOL/HDL Ratio: 5
VLDL: 19.4 mg/dL (ref 0.0–40.0)

## 2013-09-22 LAB — VITAMIN D 25 HYDROXY (VIT D DEFICIENCY, FRACTURES): VITD: 30.62 ng/mL (ref 30.00–100.00)

## 2013-09-24 ENCOUNTER — Encounter: Payer: Self-pay | Admitting: Family Medicine

## 2013-09-24 DIAGNOSIS — M25561 Pain in right knee: Secondary | ICD-10-CM | POA: Insufficient documentation

## 2013-09-24 NOTE — Assessment & Plan Note (Signed)
Well controlled, no changes to meds. Encouraged heart healthy diet such as the DASH diet and exercise as tolerated.  °

## 2013-09-24 NOTE — Progress Notes (Signed)
Patient ID: Terri Mckee, female   DOB: 1940-01-17, 73 y.o.   MRN: [id number] Terri Mckee [id number] [birth year] 09/24/2013      Progress Note-Follow Up  Subjective  Chief Complaint  Chief Complaint  Patient presents with  . Medication Refill    HPI  Patient is a 73 year old female in today for routine medical care. She is in today for medication refills and followup. Her greatest complaint is of persistent right knee pain times roughly 4 months. She denies any traumas or falls. The pain is worse with use but does keep her up at times. No warmth but there is some swelling and tenderness. No other acute complaints noted. Denies CP/palp/SOB/HA/congestion/fevers/GI or GU c/o. Taking meds as prescribed  Past Medical History  Diagnosis Date  . Hypertension   . 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 History of cyst of breast   . 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 Colon polyps   . Elevated serum creatinine     with ACE inhibitor (Normal MRA of renal arteries)  . Arthritis     back  . GERD (gastroesophageal reflux disease)   . Cataract   . Hoarseness of voice 03/13/2012  . Cervical cancer screening 07/07/2012  . Skin lesion 07/10/2012  . Urinary incontinence 07/10/2012    Past Surgical History  Procedure Laterality Date  . Ovary surgery      right ovary & tube replacement  . Wrist ganglion excision      rt  . Appendectomy    . Breast cyst excision      right breast, twice    Family History  Problem Relation Age of Onset  . Brain cancer Mother     deceased age 86  . Alcohol abuse Mother   . Other Brother     died of sepsis  . Gout Brother   . Colon cancer Neg Hx   . Alcohol abuse Father   . Cancer Sister     throat  . Pulmonary embolism Daughter   . Mental illness Daughter     anxiety  . Diabetes Maternal Aunt   . Diabetes Maternal Aunt   . Seizures Maternal Aunt     History   Social  History  . Marital Status: Married    Spouse Name: N/A    Number of Children: N/A  . Years of Education: N/A   Occupational History  . Not on file.   Social History Main Topics  . Smoking status: Former Smoker    Quit date: 01/13/1991  . Smokeless tobacco: Never Used     Comment: quit 20 years ago 1/2 ppd x 40 years  . Alcohol Use: No  . Drug Use: No  . Sexual Activity: No   Other Topics Concern  . Not on file   Social History Narrative   Former Smoker - quit 20 years ago (1/2 ppd x 40 years)   Widowed   1 Daughter         Current Outpatient Prescriptions on File Prior to Visit  Medication Sig Dispense Refill  . amLODipine (NORVASC) 10 MG tablet TAKE 1 TABLET (10 MG TOTAL) BY MOUTH DAILY.  90 tablet  0  . aspirin 81 MG tablet Take 81 mg by mouth daily.        . baclofen (LIORESAL) 10 MG tablet Take 1 tablet (10 mg total) by  mouth 3 (three) times daily as needed for muscle spasms.  40 each  1  . benzonatate (TESSALON) 100 MG capsule Take 1 capsule (100 mg total) by mouth 3 (three) times daily as needed for cough.  40 capsule  1  . calcium carbonate (OS-CAL) 600 MG TABS Take 600 mg by mouth daily.        . diclofenac sodium (VOLTAREN) 1 % GEL Apply 1 application topically 3 (three) times daily. Apply  2 grams three times a day to affected area as needed  100 g  4  . fluticasone (FLONASE) 50 MCG/ACT nasal spray Place 2 sprays into the nose daily.  16 g  2  . Garlic 144 MG CAPS Take 1 capsule by mouth daily.      . Ginseng (GIN-ZING PO) Take 1 tablet by mouth daily.      Marland Kitchen levocetirizine (XYZAL) 5 MG tablet TAKE 1 TABLET BY MOUTH EVERY EVENING  90 tablet  0  . Multiple Vitamin (MULTIVITAMIN) tablet Take 1 tablet by mouth daily.      . Omega-3 Fatty Acids (FISH OIL) 1200 MG CAPS Take 1,200 mg by mouth. 2 capsules by mouth once daily in the morning       . Omeprazole ([facility] OMEPRAZOLE) 20 MG TBEC Take 1 tablet (20 mg total) by mouth daily. 1 capsule by mouth once daily 30 minutes  before meals  30 each  5  . pravastatin (PRAVACHOL) 40 MG tablet TAKE 1 TABLET (40 MG TOTAL) BY MOUTH DAILY.  90 tablet  0  . SYMBICORT 80-4.5 MCG/ACT inhaler INHALE 2 PUFFS INTO THE LUNGS 2 (TWO) TIMES DAILY.  10.2 g  2  . traMADol (ULTRAM) 50 MG tablet Take 1 tablet (50 mg total) by mouth every 12 (twelve) hours as needed.  30 tablet  1   No current facility-administered medications on file prior to visit.    Allergies  Allergen Reactions  . Fexofenadine Shortness Of Breath  . Erythromycin Nausea Only  . Oxycodone-Acetaminophen Nausea And Vomiting  . Propoxyphene N-Acetaminophen     REACTION: severe vomiting  . Simvastatin     REACTION: Myalgia    Review of Systems  Review of Systems  Constitutional: Positive for malaise/fatigue. Negative for fever.  HENT: Negative for congestion.   Eyes: Negative for discharge.  Respiratory: Negative for shortness of breath.   Cardiovascular: Negative for chest pain, palpitations and leg swelling.  Gastrointestinal: Negative for nausea, abdominal pain and diarrhea.  Genitourinary: Negative for dysuria.  Musculoskeletal: Positive for joint pain. Negative for falls.       Right knee pain  Skin: Negative for rash.  Neurological: Negative for loss of consciousness and headaches.  Endo/Heme/Allergies: Negative for polydipsia.  Psychiatric/Behavioral: Negative for depression and suicidal ideas. The patient is not nervous/anxious and does not have insomnia.     Objective  BP 115/82  Pulse 96  Temp(Src) 99 F (37.2 C) (Oral)  Ht 5\' 10"  (1.778 m)  Wt 223 lb (101.152 kg)  BMI 32.00 kg/m2  SpO2 96%  Physical Exam  Physical Exam  Constitutional: She is oriented to person, place, and time and well-developed, well-nourished, and in no distress. No distress.  HENT:  Head: Normocephalic and atraumatic.  Eyes: Conjunctivae are normal.  Neck: Neck supple. No thyromegaly present.  Cardiovascular: Normal rate, regular rhythm and normal heart  sounds.   No murmur heard. Pulmonary/Chest: Effort normal and breath sounds normal. She has no wheezes.  Abdominal: She exhibits no distension and no  mass.  Musculoskeletal: She exhibits edema and tenderness.  Mild pain and swelling, anterolateral right knee, no warmth or redness.  Lymphadenopathy:    She has no cervical adenopathy.  Neurological: She is alert and oriented to person, place, and time.  Skin: Skin is warm and dry. No rash noted. She is not diaphoretic.  Psychiatric: Memory, affect and judgment normal.    Lab Results  Component Value Date   TSH 3.065 03/08/2012   Lab Results  Component Value Date   WBC 6.7 09/21/2013   HGB 12.7 09/21/2013   HCT 37.8 09/21/2013   MCV 86.6 09/21/2013   PLT 205.0 09/21/2013   Lab Results  Component Value Date   CREATININE 1.4* 09/21/2013   BUN 20 09/21/2013   NA 139 09/21/2013   K 3.7 09/21/2013   CL 105 09/21/2013   CO2 28 09/21/2013   Lab Results  Component Value Date   ALT 15 09/21/2013   AST 18 09/21/2013   ALKPHOS 55 09/21/2013   BILITOT 0.5 09/21/2013   Lab Results  Component Value Date   CHOL 161 09/21/2013   Lab Results  Component Value Date   HDL 35.50* 09/21/2013   Lab Results  Component Value Date   LDLCALC 106* 09/21/2013   Lab Results  Component Value Date   TRIG 97.0 09/21/2013   Lab Results  Component Value Date   CHOLHDL 5 09/21/2013     Assessment & Plan  HYPERTENSION Well controlled, no changes to meds. Encouraged heart healthy diet such as the DASH diet and exercise as tolerated.   INSOMNIA Encouraged good sleep hygiene such as dark, quiet room. No blue/green glowing lights such as computer screens in bedroom. No alcohol or stimulants in evening. Cut down on caffeine as able. Regular exercise is helpful but not just prior to bed time.   HYPERLIPIDEMIA Encouraged heart healthy diet, increase exercise, avoid trans fats, consider a krill oil cap daily  GERD Avoid offending foods, start probiotics. Do  not eat large meals in late evening and consider raising head of bed.   Right knee pain Consider ice and wraps, topical treatments and mayuse pain meds prn. Referred to ortho for further consideration given length of symptoms

## 2013-09-24 NOTE — Assessment & Plan Note (Signed)
Encouraged good sleep hygiene such as dark, quiet room. No blue/green glowing lights such as computer screens in bedroom. No alcohol or stimulants in evening. Cut down on caffeine as able. Regular exercise is helpful but not just prior to bed time.  

## 2013-09-24 NOTE — Assessment & Plan Note (Signed)
Avoid offending foods, start probiotics. Do not eat large meals in late evening and consider raising head of bed.  

## 2013-09-24 NOTE — Assessment & Plan Note (Signed)
Consider ice and wraps, topical treatments and mayuse pain meds prn. Referred to ortho for further consideration given length of symptoms

## 2013-09-24 NOTE — Assessment & Plan Note (Signed)
Encouraged heart healthy diet, increase exercise, avoid trans fats, consider a krill oil cap daily 

## 2013-10-04 ENCOUNTER — Ambulatory Visit (INDEPENDENT_AMBULATORY_CARE_PROVIDER_SITE_OTHER)
Admission: RE | Admit: 2013-10-04 | Discharge: 2013-10-04 | Disposition: A | Discharge status: Home / Self Care | Payer: Medicare Other | Source: Ambulatory Visit | Attending: Family Medicine | Admitting: Family Medicine

## 2013-10-04 DIAGNOSIS — M949 Disorder of cartilage, unspecified: Secondary | ICD-10-CM

## 2013-10-04 DIAGNOSIS — M899 Disorder of bone, unspecified: Secondary | ICD-10-CM

## 2013-10-04 DIAGNOSIS — M8580 Other specified disorders of bone density and structure, unspecified site: Secondary | ICD-10-CM

## 2013-10-11 ENCOUNTER — Encounter: Payer: Self-pay | Admitting: Internal Medicine

## 2013-10-15 ENCOUNTER — Other Ambulatory Visit: Payer: Self-pay | Admitting: Family Medicine

## 2013-10-30 ENCOUNTER — Other Ambulatory Visit: Payer: Self-pay | Admitting: Family Medicine

## 2013-11-17 ENCOUNTER — Other Ambulatory Visit: Payer: Self-pay | Admitting: Family Medicine

## 2013-11-17 NOTE — Telephone Encounter (Signed)
Medication Detail      Disp Refills Start End     pravastatin (PRAVACHOL) 40 MG tablet 90 tablet 0 10/16/2013     Sig: TAKE 1 TABLET (40 MG TOTAL) BY MOUTH DAILY.    E-Prescribing Status: Receipt confirmed by pharmacy (10/16/2013 9:00 AM EDT)     Pharmacy    [id number] - [place], [place] - [address]   Rx request Denied, Too Soon for request/SLS

## 2014-01-30 ENCOUNTER — Other Ambulatory Visit: Payer: Self-pay | Admitting: Family Medicine

## 2014-02-13 ENCOUNTER — Encounter (HOSPITAL_COMMUNITY): Payer: Self-pay

## 2014-02-13 ENCOUNTER — Emergency Department (HOSPITAL_COMMUNITY)
Admission: EM | Admit: 2014-02-13 | Discharge: 2014-02-13 | Disposition: A | Discharge status: Home / Self Care | Payer: Medicare Other | Attending: Emergency Medicine | Admitting: Emergency Medicine

## 2014-02-13 ENCOUNTER — Emergency Department (HOSPITAL_COMMUNITY): Payer: Medicare Other

## 2014-02-13 DIAGNOSIS — Z86010 Personal history of colonic polyps: Secondary | ICD-10-CM | POA: Insufficient documentation

## 2014-02-13 DIAGNOSIS — M1990 Unspecified osteoarthritis, unspecified site: Secondary | ICD-10-CM | POA: Insufficient documentation

## 2014-02-13 DIAGNOSIS — I10 Essential (primary) hypertension: Secondary | ICD-10-CM | POA: Insufficient documentation

## 2014-02-13 DIAGNOSIS — Z87448 Personal history of other diseases of urinary system: Secondary | ICD-10-CM | POA: Insufficient documentation

## 2014-02-13 DIAGNOSIS — Z124 Encounter for screening for malignant neoplasm of cervix: Secondary | ICD-10-CM | POA: Diagnosis not present

## 2014-02-13 DIAGNOSIS — Z872 Personal history of diseases of the skin and subcutaneous tissue: Secondary | ICD-10-CM | POA: Insufficient documentation

## 2014-02-13 DIAGNOSIS — Z87891 Personal history of nicotine dependence: Secondary | ICD-10-CM | POA: Insufficient documentation

## 2014-02-13 DIAGNOSIS — Z79899 Other long term (current) drug therapy: Secondary | ICD-10-CM | POA: Diagnosis not present

## 2014-02-13 DIAGNOSIS — Z8669 Personal history of other diseases of the nervous system and sense organs: Secondary | ICD-10-CM | POA: Insufficient documentation

## 2014-02-13 DIAGNOSIS — Z7951 Long term (current) use of inhaled steroids: Secondary | ICD-10-CM | POA: Diagnosis not present

## 2014-02-13 DIAGNOSIS — M79605 Pain in left leg: Secondary | ICD-10-CM

## 2014-02-13 DIAGNOSIS — Z791 Long term (current) use of non-steroidal anti-inflammatories (NSAID): Secondary | ICD-10-CM | POA: Insufficient documentation

## 2014-02-13 DIAGNOSIS — K219 Gastro-esophageal reflux disease without esophagitis: Secondary | ICD-10-CM | POA: Diagnosis not present

## 2014-02-13 DIAGNOSIS — R52 Pain, unspecified: Secondary | ICD-10-CM

## 2014-02-13 MED ORDER — ONDANSETRON HCL 4 MG/2ML IJ SOLN
4.0000 mg | Freq: Once | INTRAMUSCULAR | Status: DC
  Filled 2014-02-13: qty 2

## 2014-02-13 MED ORDER — DIAZEPAM 5 MG PO TABS: 2.5000 mg | ORAL_TABLET | Freq: Three times a day (TID) | ORAL | Status: DC | PRN

## 2014-02-13 MED ORDER — KETOROLAC TROMETHAMINE 15 MG/ML IJ SOLN
15.0000 mg | Freq: Once | INTRAMUSCULAR | Status: AC
  Administered 2014-02-13: 15 mg via INTRAMUSCULAR
  Filled 2014-02-13: qty 1

## 2014-02-13 MED ORDER — DEXAMETHASONE 4 MG PO TABS
8.0000 mg | ORAL_TABLET | Freq: Once | ORAL | Status: AC
  Administered 2014-02-13: 8 mg via ORAL
  Filled 2014-02-13: qty 2

## 2014-02-13 MED ORDER — HYDROMORPHONE HCL 1 MG/ML IJ SOLN
1.0000 mg | Freq: Once | INTRAMUSCULAR | Status: AC
  Administered 2014-02-13: 1 mg via INTRAMUSCULAR
  Filled 2014-02-13: qty 1

## 2014-02-13 MED ORDER — HYDROCODONE-ACETAMINOPHEN 5-325 MG PO TABS: 1.0000 | ORAL_TABLET | ORAL | Status: DC | PRN

## 2014-02-13 MED ORDER — ONDANSETRON 4 MG PO TBDP
8.0000 mg | ORAL_TABLET | Freq: Once | ORAL | Status: AC
  Administered 2014-02-13: 8 mg via ORAL
  Filled 2014-02-13: qty 2

## 2014-02-13 NOTE — ED Notes (Signed)
Xray called  For update on pt transport to x-ray. Pt and family updated.

## 2014-02-13 NOTE — ED Notes (Signed)
Patient transported to x-ray. ?

## 2014-02-13 NOTE — Discharge Instructions (Signed)

## 2014-02-13 NOTE — ED Notes (Signed)
Pt has been having left leg pain for the past month that has progressively gotten worse. Has been taking OTC meds with any relief. sts the pain goes up to her hip area.

## 2014-02-19 NOTE — ED Provider Notes (Signed)
CSN: [id number]     Arrival date & time 02/13/14  1016 History   First MD Initiated Contact with Patient 02/13/14 1021     Chief Complaint  Patient presents with  . Leg Pain     (Consider location/radiation/quality/duration/timing/severity/associated sxs/prior Treatment) HPI  74 year old female with left hip pain. Onset about a month ago. Denies any trauma. Very minimal symptoms at rest. Increasing pain with movement and ambulation. Pain radiates from the left hip to about the mid to distal thigh. No numbness or tingling. She feels like her strength is okay. No urinary complaints. No swelling. No fevers or chills. No rash. Has been Tylenol with minimal relief.  Past Medical History  Diagnosis Date  . Hypertension   . 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 History of cyst of breast   . 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 Colon polyps   . Elevated serum creatinine     with ACE inhibitor (Normal MRA of renal arteries)  . Arthritis     back  . GERD (gastroesophageal reflux disease)   . Cataract   . Hoarseness of voice 03/13/2012  . Cervical cancer screening 07/07/2012  . Skin lesion 07/10/2012  . Urinary incontinence 07/10/2012   Past Surgical History  Procedure Laterality Date  . Ovary surgery      right ovary & tube replacement  . Wrist ganglion excision      rt  . Appendectomy    . Breast cyst excision      right breast, twice   Family History  Problem Relation Age of Onset  . Brain cancer Mother     deceased age 38  . Alcohol abuse Mother   . Other Brother     died of sepsis  . Gout Brother   . Colon cancer Neg Hx   . Alcohol abuse Father   . Cancer Sister     throat  . Pulmonary embolism Daughter   . Mental illness Daughter     anxiety  . Diabetes Maternal Aunt   . Diabetes Maternal Aunt   . Seizures Maternal Aunt    History  Substance Use Topics  . Smoking status: Former Smoker    Quit date:  01/13/1991  . Smokeless tobacco: Never Used     Comment: quit 20 years ago 1/2 ppd x 40 years  . Alcohol Use: No   OB History    No data available     Review of Systems  All systems reviewed and negative, other than as noted in HPI.   Allergies  Fexofenadine; Erythromycin; Oxycodone-acetaminophen; Propoxyphene n-acetaminophen; and Simvastatin  Home Medications   Prior to Admission medications   Medication Sig Start Date End Date Taking? Authorizing Provider  amLODipine (NORVASC) 10 MG tablet TAKE 1 TABLET (10 MG TOTAL) BY MOUTH DAILY. 01/31/14  Yes [name], MD  aspirin 81 MG tablet Take 81 mg by mouth daily.     Yes Historical Provider, MD  calcium carbonate (OS-CAL) 600 MG TABS Take 600 mg by mouth daily.     Yes Historical Provider, MD  diclofenac sodium (VOLTAREN) 1 % GEL Apply 1 application topically 3 (three) times daily. Apply  2 grams three times a day to affected area as needed 10/31/10  Yes Burnice Logan, MD  fluticasone [facility]) 50 MCG/ACT nasal spray Place 2 sprays into the nose daily. 04/01/12  Yes Debbrah Alar,  NP  Garlic 562 MG CAPS Take 1 capsule by mouth daily.   Yes Historical Provider, MD  levocetirizine (XYZAL) 5 MG tablet TAKE 1 TABLET BY MOUTH EVERY EVENING   Yes [name], MD  Multiple Vitamin (MULTIVITAMIN) tablet Take 1 tablet by mouth daily.   Yes Historical Provider, MD  Omega-3 Fatty Acids (FISH OIL) 1200 MG CAPS Take 1,200 mg by mouth. 2 capsules by mouth once daily in the morning    Yes Historical Provider, MD  pravastatin (PRAVACHOL) 40 MG tablet TAKE 1 TABLET (40 MG TOTAL) BY MOUTH DAILY. 10/16/13  Yes [name], MD  SYMBICORT 80-4.5 MCG/ACT inhaler INHALE 2 PUFFS INTO THE LUNGS 2 (TWO) TIMES DAILY. 02/16/13  Yes [name], MD  baclofen (LIORESAL) 10 MG tablet Take 1 tablet (10 mg total) by mouth 3 (three) times daily as needed for muscle spasms. Patient not taking: Reported on 02/13/2014 11/28/12   [name], MD   benzonatate (TESSALON) 100 MG capsule Take 1 capsule (100 mg total) by mouth 3 (three) times daily as needed for cough. Patient not taking: Reported on 02/13/2014 11/28/12   [name], MD  diazepam (VALIUM) 5 MG tablet Take 0.5 tablets (2.5 mg total) by mouth every 8 (eight) hours as needed for muscle spasms. 02/13/14   Virgel Manifold, MD  HYDROcodone-acetaminophen (NORCO/VICODIN) 5-325 MG per tablet Take 1 tablet by mouth every 4 (four) hours as needed for moderate pain or severe pain. 02/13/14   Virgel Manifold, MD  Omeprazole ([facility] OMEPRAZOLE) 20 MG TBEC Take 1 tablet (20 mg total) by mouth daily. 1 capsule by mouth once daily 30 minutes before meals Patient not taking: Reported on 02/13/2014 05/08/10   Doe-Hyun R Shawna Orleans, DO  traMADol (ULTRAM) 50 MG tablet Take 1 tablet (50 mg total) by mouth every 12 (twelve) hours as needed. Patient not taking: Reported on 02/13/2014 11/28/12   [name], MD   BP 141/66 mmHg  Pulse 70  Temp(Src) 97.8 F (36.6 C) (Oral)  Resp 14  SpO2 97% Physical Exam  Constitutional: She appears well-developed and well-nourished. No distress.  HENT:  Head: Normocephalic and atraumatic.  Eyes: Conjunctivae are normal. Right eye exhibits no discharge. Left eye exhibits no discharge.  Neck: Neck supple.  Cardiovascular: Normal rate, regular rhythm and normal heart sounds.  Exam reveals no gallop and no friction rub.   No murmur heard. Pulmonary/Chest: Effort normal and breath sounds normal. No respiratory distress.  Abdominal: Soft. She exhibits no distension. There is no tenderness.  Musculoskeletal: She exhibits no edema.  Pelvis stable. No bony tenderness around the left pelvis lower back or hip. Pain is reproducible with range of motion of the left hip, particular internal rotation. Patient is able to actively range her hip herself. Negative straight leg test. Neurovascular intact distally.  Neurological: She is alert.  Skin: Skin is warm and dry.  Psychiatric: She has  a normal mood and affect. Her behavior is normal. Thought content normal.  Nursing note and vitals reviewed.   ED Course  Procedures (including critical care time) Labs Review Labs Reviewed - No data to display  Imaging Review No results found.   Dg Hip Unilat With Pelvis 2-3 Views Left  02/13/2014   CLINICAL DATA:  Left-sided hip pain for 2 months.  No known injury.  EXAM: LEFT HIP (WITH PELVIS) 2-3 VIEWS  COMPARISON:  None.  FINDINGS: No evidence of acute or remote fracture. No dislocation. No significant left hip joint  narrowing or degenerative spurring. Calcified uterine fibroids are incidentally noted.  IMPRESSION: No acute finding or significant degenerative change to explain left hip pain.   Electronically Signed   By: Jorje Guild M.D.   On: 02/13/2014 12:52    EKG Interpretation None      MDM   Final diagnoses:  Pain  Left leg pain    74 year old female with atraumatic pain to her left hip. Unclear etiology. Not clearly lumbar radiculopathy. She is able to bear weight. Neurovascularly intact. Plain films negative. At this time plan symptomatic treatment. Low suspicion for emergent process. Return precautions discussed.    Virgel Manifold, MD 02/19/14 [id number]

## 2014-03-13 ENCOUNTER — Other Ambulatory Visit: Payer: Self-pay | Admitting: Family Medicine

## 2014-03-26 ENCOUNTER — Ambulatory Visit: Payer: Medicare Other | Admitting: Family Medicine

## 2014-03-29 ENCOUNTER — Ambulatory Visit (INDEPENDENT_AMBULATORY_CARE_PROVIDER_SITE_OTHER): Payer: Medicare Other | Admitting: Family Medicine

## 2014-03-29 ENCOUNTER — Encounter: Payer: Self-pay | Admitting: Family Medicine

## 2014-03-29 VITALS — BP 137/56 | HR 64 | Temp 97.7°F | Ht 70.0 in | Wt 224.6 lb

## 2014-03-29 DIAGNOSIS — J309 Allergic rhinitis, unspecified: Secondary | ICD-10-CM

## 2014-03-29 DIAGNOSIS — E782 Mixed hyperlipidemia: Secondary | ICD-10-CM | POA: Diagnosis not present

## 2014-03-29 DIAGNOSIS — I10 Essential (primary) hypertension: Secondary | ICD-10-CM | POA: Diagnosis not present

## 2014-03-29 DIAGNOSIS — J069 Acute upper respiratory infection, unspecified: Secondary | ICD-10-CM

## 2014-03-29 DIAGNOSIS — K219 Gastro-esophageal reflux disease without esophagitis: Secondary | ICD-10-CM | POA: Diagnosis not present

## 2014-03-29 DIAGNOSIS — M25561 Pain in right knee: Secondary | ICD-10-CM

## 2014-03-29 LAB — COMPREHENSIVE METABOLIC PANEL
ALBUMIN: 4 g/dL (ref 3.5–5.2)
ALT: 14 U/L (ref 0–35)
AST: 16 U/L (ref 0–37)
Alkaline Phosphatase: 56 U/L (ref 39–117)
BUN: 19 mg/dL (ref 6–23)
CALCIUM: 9 mg/dL (ref 8.4–10.5)
CHLORIDE: 107 meq/L (ref 96–112)
CO2: 29 mEq/L (ref 19–32)
Creatinine, Ser: 1.05 mg/dL (ref 0.40–1.20)
GFR: 65.86 mL/min (ref >60.00)
GLUCOSE: 73 mg/dL (ref 70–99)
Potassium: 3.3 mEq/L — ABNORMAL LOW (ref 3.5–5.1)
SODIUM: 141 meq/L (ref 135–145)
Total Bilirubin: 0.3 mg/dL (ref 0.2–1.2)
Total Protein: 7.2 g/dL (ref 6.0–8.3)

## 2014-03-29 LAB — CBC
HCT: 38.8 % (ref 36.0–46.0)
Hemoglobin: 12.9 g/dL (ref 12.0–15.0)
MCHC: 33.3 g/dL (ref 30.0–36.0)
MCV: 84.6 fl (ref 78.0–100.0)
PLATELETS: 208 10*3/uL (ref 150.0–400.0)
RBC: 4.59 Mil/uL (ref 3.87–5.11)
RDW: 14.7 % (ref 11.5–15.5)
WBC: 6 10*3/uL (ref 4.0–10.5)

## 2014-03-29 LAB — LIPID PANEL
Cholesterol: 137 mg/dL (ref 0–200)
HDL: 42.8 mg/dL (ref >39.00)
LDL Cholesterol: 82 mg/dL (ref 0–99)
NonHDL: 94.2
Total CHOL/HDL Ratio: 3
Triglycerides: 60 mg/dL (ref 0.0–149.0)
VLDL: 12 mg/dL (ref 0.0–40.0)

## 2014-03-29 LAB — TSH: TSH: 2.23 u[IU]/mL (ref 0.35–4.50)

## 2014-03-29 MED ORDER — HYDROCODONE-HOMATROPINE 5-1.5 MG/5ML PO SYRP: 5.0000 mL | ORAL_SOLUTION | Freq: Three times a day (TID) | ORAL | Status: DC | PRN

## 2014-03-29 MED ORDER — OMEPRAZOLE 20 MG PO TBEC: 20.0000 mg | DELAYED_RELEASE_TABLET | Freq: Every day | ORAL | Status: DC

## 2014-03-29 MED ORDER — AZITHROMYCIN 250 MG PO TABS: ORAL_TABLET | ORAL | Status: DC

## 2014-03-29 MED ORDER — DICLOFENAC SODIUM 1 % TD GEL: 1.0000 "application " | Freq: Three times a day (TID) | TRANSDERMAL | Status: DC

## 2014-03-29 NOTE — Patient Instructions (Signed)

## 2014-03-29 NOTE — Progress Notes (Signed)
Pre visit review using our clinic review tool, if applicable. No additional management support is needed unless otherwise documented below in the visit note. 

## 2014-04-09 ENCOUNTER — Encounter: Payer: Self-pay | Admitting: Family Medicine

## 2014-04-09 DIAGNOSIS — J069 Acute upper respiratory infection, unspecified: Secondary | ICD-10-CM

## 2014-04-09 HISTORY — DX: Acute upper respiratory infection, unspecified: J06.9

## 2014-04-09 NOTE — Assessment & Plan Note (Signed)
Acute respiratory symptoms improved initially but worsening now. Given a prescription for antibiotics and cough syrup if symptoms worsen. Encouraged increased rest and hydration, add probiotics, zinc such as Coldeze or Xicam. Treat fevers as needed

## 2014-04-09 NOTE — Assessment & Plan Note (Signed)
Tolerating statin, encouraged heart healthy diet, avoid trans fats, minimize simple carbs and saturated fats. Increase exercise as tolerated 

## 2014-04-09 NOTE — Assessment & Plan Note (Signed)
Avoid offending foods, start probiotics. Do not eat large meals in late evening and consider raising head of bed.  

## 2014-04-09 NOTE — Assessment & Plan Note (Signed)
Well controlled, no changes to meds. Encouraged heart healthy diet such as the DASH diet and exercise as tolerated.  °

## 2014-04-09 NOTE — Assessment & Plan Note (Signed)
Encouraged daily antihistamines and Flonasae

## 2014-04-09 NOTE — Assessment & Plan Note (Signed)
Follows with orthopaedics, Dr Oneita Kras. Received a cortisone injection recently with only minimal improvement.

## 2014-04-09 NOTE — Progress Notes (Signed)
Terri Mckee  [id number] [birth year] 04/09/2014      Progress Note-Follow Up  Subjective  Chief Complaint  Chief Complaint  Patient presents with  . Follow-up    6 mos    HPI  Patient is a 74 y.o. female in today for routine medical care. Patient is in for follow up. Has been struggling with  recurrent respiratory infection symptoms. She was improving but is now noting increased cough productive of yellow sputum with headache and malaise. Denies fevers, chills, myalgias. Denies CP/palp/SOB/fevers/GI or GU c/o. Taking meds as prescribed  Past Medical History  Diagnosis Date  . Hypertension   . 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 History of cyst of breast   . 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 Colon polyps   . Elevated serum creatinine     with ACE inhibitor (Normal MRA of renal arteries)  . Arthritis     back  . GERD (gastroesophageal reflux disease)   . Cataract   . Hoarseness of voice 03/13/2012  . Cervical cancer screening 07/07/2012  . Skin lesion 07/10/2012  . Urinary incontinence 07/10/2012  . Acute upper respiratory infection 04/09/2014    Past Surgical History  Procedure Laterality Date  . Ovary surgery      right ovary & tube replacement  . Wrist ganglion excision      rt  . Appendectomy    . Breast cyst excision      right breast, twice    Family History  Problem Relation Age of Onset  . Brain cancer Mother     deceased age 76  . Alcohol abuse Mother   . Other Brother     died of sepsis  . Gout Brother   . Colon cancer Neg Hx   . Alcohol abuse Father   . Cancer Sister     throat  . Pulmonary embolism Daughter   . Mental illness Daughter     anxiety  . Diabetes Maternal Aunt   . Diabetes Maternal Aunt   . Seizures Maternal Aunt     History   Social History  . Marital Status: Married    Spouse Name: N/A  . Number of Children: N/A  . Years of Education: N/A    Occupational History  . Not on file.   Social History Main Topics  . Smoking status: Former Smoker    Quit date: 01/13/1991  . Smokeless tobacco: Never Used     Comment: quit 20 years ago 1/2 ppd x 40 years  . Alcohol Use: No  . Drug Use: No  . Sexual Activity: No   Other Topics Concern  . Not on file   Social History Narrative   Former Smoker - quit 20 years ago (1/2 ppd x 40 years)   Widowed   1 Daughter         Current Outpatient Prescriptions on File Prior to Visit  Medication Sig Dispense Refill  . amLODipine (NORVASC) 10 MG tablet TAKE 1 TABLET (10 MG TOTAL) BY MOUTH DAILY. 90 tablet 1  . aspirin 81 MG tablet Take 81 mg by mouth daily.      . calcium carbonate (OS-CAL) 600 MG TABS Take 600 mg by mouth daily.      . diazepam (VALIUM) 5 MG tablet Take 0.5 tablets (2.5 mg total) by mouth every 8 (eight) hours as needed for muscle spasms.  10 tablet 0  . fluticasone (FLONASE) 50 MCG/ACT nasal spray Place 2 sprays into the nose daily. 16 g 2  . Garlic 462 MG CAPS Take 1 capsule by mouth daily.    Marland Kitchen HYDROcodone-acetaminophen (NORCO/VICODIN) 5-325 MG per tablet Take 1 tablet by mouth every 4 (four) hours as needed for moderate pain or severe pain. 20 tablet 0  . levocetirizine (XYZAL) 5 MG tablet TAKE 1 TABLET BY MOUTH EVERY EVENING 90 tablet 0  . Multiple Vitamin (MULTIVITAMIN) tablet Take 1 tablet by mouth daily.    . Omega-3 Fatty Acids (FISH OIL) 1200 MG CAPS Take 1,200 mg by mouth. 2 capsules by mouth once daily in the morning     . pravastatin (PRAVACHOL) 40 MG tablet TAKE 1 TABLET (40 MG TOTAL) BY MOUTH DAILY. 90 tablet 0  . SYMBICORT 80-4.5 MCG/ACT inhaler INHALE 2 PUFFS INTO THE LUNGS 2 (TWO) TIMES DAILY. 10.2 g 2   No current facility-administered medications on file prior to visit.    Allergies  Allergen Reactions  . Fexofenadine Shortness Of Breath  . Erythromycin Nausea Only  . Oxycodone-Acetaminophen Nausea And Vomiting  . Propoxyphene N-Acetaminophen      REACTION: severe vomiting  . Simvastatin     REACTION: Myalgia    Review of Systems  Review of Systems  Constitutional: Positive for chills. Negative for fever and malaise/fatigue.  HENT: Positive for congestion.   Eyes: Negative for discharge.  Respiratory: Positive for cough and sputum production. Negative for shortness of breath.   Cardiovascular: Negative for chest pain, palpitations and leg swelling.  Gastrointestinal: Negative for nausea, abdominal pain and diarrhea.  Genitourinary: Negative for dysuria.  Musculoskeletal: Negative for myalgias and falls.  Skin: Negative for rash.  Neurological: Negative for loss of consciousness and headaches.  Endo/Heme/Allergies: Negative for polydipsia.  Psychiatric/Behavioral: Negative for depression and suicidal ideas. The patient is not nervous/anxious and does not have insomnia.     Objective  BP 137/56 mmHg  Pulse 64  Temp(Src) 97.7 F (36.5 C) (Oral)  Ht 5\' 10"  (1.778 m)  Wt 224 lb 9.6 oz (101.878 kg)  BMI 32.23 kg/m2  SpO2 100%  Physical Exam  Physical Exam  Constitutional: She is oriented to person, place, and time and well-developed, well-nourished, and in no distress. No distress.  HENT:  Head: Normocephalic and atraumatic.  Eyes: Conjunctivae are normal.  Neck: Neck supple. No thyromegaly present.  Cardiovascular: Normal rate, regular rhythm and normal heart sounds.   Pulmonary/Chest: Effort normal and breath sounds normal. She has no wheezes.  Abdominal: She exhibits no distension and no mass.  Musculoskeletal: She exhibits no edema.  Lymphadenopathy:    She has no cervical adenopathy.  Neurological: She is alert and oriented to person, place, and time.  Skin: Skin is warm and dry. No rash noted. She is not diaphoretic.  Psychiatric: Memory, affect and judgment normal.    Lab Results  Component Value Date   TSH 2.23 03/29/2014   Lab Results  Component Value Date   WBC 6.0 03/29/2014   HGB 12.9 03/29/2014    HCT 38.8 03/29/2014   MCV 84.6 03/29/2014   PLT 208.0 03/29/2014   Lab Results  Component Value Date   CREATININE 1.05 03/29/2014   BUN 19 03/29/2014   NA 141 03/29/2014   K 3.3* 03/29/2014   CL 107 03/29/2014   CO2 29 03/29/2014   Lab Results  Component Value Date   ALT 14 03/29/2014   AST 16 03/29/2014   ALKPHOS  56 03/29/2014   BILITOT 0.3 03/29/2014   Lab Results  Component Value Date   CHOL 137 03/29/2014   Lab Results  Component Value Date   HDL 42.80 03/29/2014   Lab Results  Component Value Date   LDLCALC 82 03/29/2014   Lab Results  Component Value Date   TRIG 60.0 03/29/2014   Lab Results  Component Value Date   CHOLHDL 3 03/29/2014     Assessment & Plan  Allergic rhinitis Encouraged daily antihistamines and Flonasae   GERD Avoid offending foods, start probiotics. Do not eat large meals in late evening and consider raising head of bed.    Hyperlipidemia, mixed Tolerating statin, encouraged heart healthy diet, avoid trans fats, minimize simple carbs and saturated fats. Increase exercise as tolerated   Right knee pain Follows with orthopaedics, Dr Oneita Kras. Received a cortisone injection recently with only minimal improvement.    Essential hypertension Well controlled, no changes to meds. Encouraged heart healthy diet such as the DASH diet and exercise as tolerated.    Acute upper respiratory infection Acute respiratory symptoms improved initially but worsening now. Given a prescription for antibiotics and cough syrup if symptoms worsen. Encouraged increased rest and hydration, add probiotics, zinc such as Coldeze or Xicam. Treat fevers as needed

## 2014-06-07 ENCOUNTER — Ambulatory Visit (INDEPENDENT_AMBULATORY_CARE_PROVIDER_SITE_OTHER): Payer: Medicare Other | Admitting: Medical

## 2014-06-07 ENCOUNTER — Encounter: Payer: Self-pay | Admitting: Medical

## 2014-06-07 VITALS — BP 115/60 | HR 78 | Temp 98.0°F | Wt 228.0 lb

## 2014-06-07 DIAGNOSIS — J301 Allergic rhinitis due to pollen: Secondary | ICD-10-CM | POA: Diagnosis not present

## 2014-06-07 MED ORDER — METHYLPREDNISOLONE ACETATE 40 MG/ML IJ SUSP
40.0000 mg | Freq: Once | INTRAMUSCULAR | Status: AC
  Administered 2014-06-07: 40 mg via INTRAMUSCULAR

## 2014-06-07 MED ORDER — AZITHROMYCIN 250 MG PO TABS: ORAL_TABLET | ORAL | Status: DC

## 2014-06-07 MED ORDER — HYDROCODONE-HOMATROPINE 5-1.5 MG/5ML PO SYRP
5.0000 mL | ORAL_SOLUTION | Freq: Three times a day (TID) | ORAL | Status: DC | PRN
Start: 2014-06-07 — End: 2014-07-09

## 2014-06-07 NOTE — Progress Notes (Signed)
Pre visit review using our clinic review tool, if applicable. No additional management support is needed unless otherwise documented below in the visit note. 

## 2014-06-07 NOTE — Assessment & Plan Note (Addendum)
Continue xyzal and flonase. Will give depomedol 40 mg im.   Hydromet for cough.

## 2014-06-07 NOTE — Addendum Note (Signed)
Addended by: Peggyann Shoals on: 06/07/2014 05:05 PM   Modules accepted: Orders

## 2014-06-07 NOTE — Progress Notes (Signed)
Subjective:    Patient ID: Terri Mckee, female    DOB: 10/20/1940, 74 y.o.   MRN: [id number]  HPI  Pt in with sneezing, runny nose, pnd, st, and some  productive cough. No sinus pressure. Symptoms for past 3 years. Pt is on flonase. Also on xyzal. Pt is not diabetic. Pt taking delsym otc.(Advised to stop since I am giving cough med today)  No fever, no chills or sweats.   Pt is on symbicort. Pt not wheezing recently.   Review of Systems  Constitutional: Negative for fever, chills and fatigue.  HENT: Positive for congestion, postnasal drip, rhinorrhea, sinus pressure and sneezing.   Respiratory: Positive for cough. Negative for chest tightness, shortness of breath and wheezing.   Cardiovascular: Negative for chest pain and palpitations.  Musculoskeletal: Negative for back pain.  Neurological: Negative for dizziness, seizures, weakness, light-headedness and headaches.  Hematological: Negative for adenopathy. Does not bruise/bleed easily.  Psychiatric/Behavioral: Negative for behavioral problems and confusion.    Past Medical History  Diagnosis Date  . Hypertension   . 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 History of cyst of breast   . 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 Colon polyps   . Elevated serum creatinine     with ACE inhibitor (Normal MRA of renal arteries)  . Arthritis     back  . GERD (gastroesophageal reflux disease)   . Cataract   . Hoarseness of voice 03/13/2012  . Cervical cancer screening 07/07/2012  . Skin lesion 07/10/2012  . Urinary incontinence 07/10/2012  . Acute upper respiratory infection 04/09/2014    History   Social History  . Marital Status: Married    Spouse Name: N/A  . Number of Children: N/A  . Years of Education: N/A   Occupational History  . Not on file.   Social History Main Topics  . Smoking status: Former Smoker    Quit date: 01/13/1991  . Smokeless tobacco:  Never Used     Comment: quit 20 years ago 1/2 ppd x 40 years  . Alcohol Use: No  . Drug Use: No  . Sexual Activity: No   Other Topics Concern  . Not on file   Social History Narrative   Former Smoker - quit 20 years ago (1/2 ppd x 40 years)   Widowed   1 Daughter         Past Surgical History  Procedure Laterality Date  . Ovary surgery      right ovary & tube replacement  . Wrist ganglion excision      rt  . Appendectomy    . Breast cyst excision      right breast, twice    Family History  Problem Relation Age of Onset  . Brain cancer Mother     deceased age 73  . Alcohol abuse Mother   . Other Brother     died of sepsis  . Gout Brother   . Colon cancer Neg Hx   . Alcohol abuse Father   . Cancer Sister     throat  . Pulmonary embolism Daughter   . Mental illness Daughter     anxiety  . Diabetes Maternal Aunt   . Diabetes Maternal Aunt   . Seizures Maternal Aunt     Allergies  Allergen Reactions  . Fexofenadine Shortness Of Breath  . Erythromycin Nausea Only  .  Oxycodone-Acetaminophen Nausea And Vomiting  . Propoxyphene N-Acetaminophen     REACTION: severe vomiting  . Simvastatin     REACTION: Myalgia    Current Outpatient Prescriptions on File Prior to Visit  Medication Sig Dispense Refill  . amLODipine (NORVASC) 10 MG tablet TAKE 1 TABLET (10 MG TOTAL) BY MOUTH DAILY. 90 tablet 1  . aspirin 81 MG tablet Take 81 mg by mouth daily.      . calcium carbonate (OS-CAL) 600 MG TABS Take 600 mg by mouth daily.      . diclofenac sodium (VOLTAREN) 1 % GEL Apply 1 application topically 3 (three) times daily. Apply  2 grams three times a day to affected area as needed 100 g 4  . fluticasone (FLONASE) 50 MCG/ACT nasal spray Place 2 sprays into the nose daily. 16 g 2  . Garlic 400 MG CAPS Take 1 capsule by mouth daily.    Marland Kitchen levocetirizine (XYZAL) 5 MG tablet TAKE 1 TABLET BY MOUTH EVERY EVENING 90 tablet 0  . Multiple Vitamin (MULTIVITAMIN) tablet Take 1 tablet by  mouth daily.    . Omega-3 Fatty Acids (FISH OIL) 1200 MG CAPS Take 1,200 mg by mouth. 2 capsules by mouth once daily in the morning     . Omeprazole ([facility] OMEPRAZOLE) 20 MG TBEC Take 1 tablet (20 mg total) by mouth daily. 1 capsule by mouth once daily 30 minutes before meals 30 each 5  . pravastatin (PRAVACHOL) 40 MG tablet TAKE 1 TABLET (40 MG TOTAL) BY MOUTH DAILY. 90 tablet 0  . SYMBICORT 80-4.5 MCG/ACT inhaler INHALE 2 PUFFS INTO THE LUNGS 2 (TWO) TIMES DAILY. 10.2 g 2  . azithromycin (ZITHROMAX) 250 MG tablet 2 tabs po once then 1 tab po daily x 4 days (Patient not taking: Reported on 06/07/2014) 6 tablet 0  . diazepam (VALIUM) 5 MG tablet Take 0.5 tablets (2.5 mg total) by mouth every 8 (eight) hours as needed for muscle spasms. (Patient not taking: Reported on 06/07/2014) 10 tablet 0  . HYDROcodone-acetaminophen (NORCO/VICODIN) 5-325 MG per tablet Take 1 tablet by mouth every 4 (four) hours as needed for moderate pain or severe pain. (Patient not taking: Reported on 06/07/2014) 20 tablet 0  . HYDROcodone-homatropine (HYCODAN) 5-1.5 MG/5ML syrup Take 5 mLs by mouth every 8 (eight) hours as needed for cough. (Patient not taking: Reported on 06/07/2014) 120 mL 0   No current facility-administered medications on file prior to visit.    BP 115/60 mmHg  Pulse 78  Temp(Src) 98 F (36.7 C)  Wt 228 lb (103.42 kg)  SpO2 96%       Objective:   Physical Exam   General  Mental Status - Alert. General Appearance - Well groomed. Not in acute distress.  Skin Rashes- No Rashes.  HEENT Head- Normal. Ear Auditory Canal - Left- Normal. Right - Normal.Tympanic Membrane- Left- Normal. Right- Normal. Eye Sclera/Conjunctiva- Left- Normal. Right- Normal. Nose & Sinuses Nasal Mucosa- Left-  Boggy and Congested. Right-  Boggy and  Congested.Bilateral no maxillary and no frontal sinus pressure. Mouth & Throat Lips: Upper Lip- Normal: no dryness, cracking, pallor, cyanosis, or vesicular eruption. Lower  Lip-Normal: no dryness, cracking, pallor, cyanosis or vesicular eruption. Buccal Mucosa- Bilateral- No Aphthous ulcers. Oropharynx- No Discharge or Erythema. +pnd Tonsils: Characteristics- Bilateral- No Erythema or Congestion. Size/Enlargement- Bilateral- No enlargement. Discharge- bilateral-None.  Neck Neck- Supple. No Masses.   Chest and Lung Exam Auscultation: Breath Sounds:-Clear even and unlabored.  Cardiovascular Auscultation:Rythm- Regular, rate and rhythm. Murmurs &  Other Heart Sounds:Ausculatation of the heart reveal- No Murmurs.  Lymphatic Head & Neck General Head & Neck Lymphatics: Bilateral: Description- No Localized lymphadenopathy.      Assessment & Plan:

## 2014-06-07 NOTE — Patient Instructions (Addendum)
Allergic rhinitis Continue xyzal and flonase. Will give depomedol 40 mg im.   Hydromet for cough.    Note if sinus pressure and if chest congestion worsens go ahead and start azithromycin. This antibiotic sent to your pharmacy.  Follow up in 7 days or as needed

## 2014-06-24 ENCOUNTER — Other Ambulatory Visit: Payer: Self-pay | Admitting: Family Medicine

## 2014-06-29 ENCOUNTER — Other Ambulatory Visit: Payer: Self-pay | Admitting: Family Medicine

## 2014-07-09 ENCOUNTER — Ambulatory Visit (INDEPENDENT_AMBULATORY_CARE_PROVIDER_SITE_OTHER): Payer: Medicare Other | Admitting: Physician Assistant

## 2014-07-09 ENCOUNTER — Encounter: Payer: Self-pay | Admitting: Physician Assistant

## 2014-07-09 VITALS — BP 131/59 | HR 79 | Temp 98.2°F | Ht 70.0 in | Wt 231.4 lb

## 2014-07-09 DIAGNOSIS — J00 Acute nasopharyngitis [common cold]: Secondary | ICD-10-CM

## 2014-07-09 DIAGNOSIS — B9689 Other specified bacterial agents as the cause of diseases classified elsewhere: Secondary | ICD-10-CM

## 2014-07-09 DIAGNOSIS — J208 Acute bronchitis due to other specified organisms: Principal | ICD-10-CM

## 2014-07-09 MED ORDER — GUAIFENESIN-CODEINE 100-10 MG/5ML PO SYRP: 5.0000 mL | ORAL_SOLUTION | Freq: Three times a day (TID) | ORAL | Status: DC | PRN

## 2014-07-09 MED ORDER — DOXYCYCLINE HYCLATE 100 MG PO CAPS: 100.0000 mg | ORAL_CAPSULE | Freq: Two times a day (BID) | ORAL | Status: DC

## 2014-07-09 NOTE — Progress Notes (Signed)
Pre visit review using our clinic review tool, if applicable. No additional management support is needed unless otherwise documented below in the visit note. 

## 2014-07-09 NOTE — Patient Instructions (Signed)
Please take antibiotic as directed. Use the Cough syrup three times daily as needed for cough. Start some plain Mucinex.  Place a humidifier in the bedroom.  Stay well hydrated and get plenty of rest.  Call or return to clinic if symptoms are not improving.

## 2014-07-09 NOTE — Assessment & Plan Note (Signed)
Rx Doxycyline.  Rx Cheratussin. Increase fluids. Plain Mucinex. Rest. Humidifier in bedroom. Return PRN if symptoms are not resolving.

## 2014-07-09 NOTE — Progress Notes (Signed)
Patient presents to clinic today c/o 2 weeks of chest congestion and cough that is productive of yellow sputum.  Denies fever, chills, SOB, chest pain. Denies recent travel or sick contact.  Past Medical History  Diagnosis Date  . Hypertension   . 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 History of cyst of breast   . 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 Colon polyps   . Elevated serum creatinine     with ACE inhibitor (Normal MRA of renal arteries)  . Arthritis     back  . GERD (gastroesophageal reflux disease)   . Cataract   . Hoarseness of voice 03/13/2012  . Cervical cancer screening 07/07/2012  . Skin lesion 07/10/2012  . Urinary incontinence 07/10/2012  . Acute upper respiratory infection 04/09/2014    Current Outpatient Prescriptions on File Prior to Visit  Medication Sig Dispense Refill  . amLODipine (NORVASC) 10 MG tablet TAKE 1 TABLET (10 MG TOTAL) BY MOUTH DAILY. 90 tablet 1  . aspirin 81 MG tablet Take 81 mg by mouth daily.      . calcium carbonate (OS-CAL) 600 MG TABS Take 600 mg by mouth daily.      . diclofenac sodium (VOLTAREN) 1 % GEL Apply 1 application topically 3 (three) times daily. Apply  2 grams three times a day to affected area as needed 100 g 4  . fluticasone (FLONASE) 50 MCG/ACT nasal spray Place 2 sprays into the nose daily. 16 g 2  . Garlic 357 MG CAPS Take 1 capsule by mouth daily.    Marland Kitchen levocetirizine (XYZAL) 5 MG tablet TAKE 1 TABLET BY MOUTH EVERY EVENING 90 tablet 0  . Multiple Vitamin (MULTIVITAMIN) tablet Take 1 tablet by mouth daily.    . Omega-3 Fatty Acids (FISH OIL) 1200 MG CAPS Take 1,200 mg by mouth. 2 capsules by mouth once daily in the morning     . Omeprazole ([facility] OMEPRAZOLE) 20 MG TBEC Take 1 tablet (20 mg total) by mouth daily. 1 capsule by mouth once daily 30 minutes before meals 30 each 5  . pravastatin (PRAVACHOL) 40 MG tablet TAKE 1 TABLET (40 MG TOTAL) BY MOUTH DAILY. 90  tablet 2  . SYMBICORT 80-4.5 MCG/ACT inhaler INHALE 2 PUFFS INTO THE LUNGS 2 (TWO) TIMES DAILY. 10.2 g 2  . diazepam (VALIUM) 5 MG tablet Take 0.5 tablets (2.5 mg total) by mouth every 8 (eight) hours as needed for muscle spasms. (Patient not taking: Reported on 06/07/2014) 10 tablet 0  . HYDROcodone-acetaminophen (NORCO/VICODIN) 5-325 MG per tablet Take 1 tablet by mouth every 4 (four) hours as needed for moderate pain or severe pain. (Patient not taking: Reported on 06/07/2014) 20 tablet 0   No current facility-administered medications on file prior to visit.    Allergies  Allergen Reactions  . Fexofenadine Shortness Of Breath  . Erythromycin Nausea Only  . Oxycodone-Acetaminophen Nausea And Vomiting  . Propoxyphene N-Acetaminophen     REACTION: severe vomiting  . Simvastatin     REACTION: Myalgia    Family History  Problem Relation Age of Onset  . Brain cancer Mother     deceased age 79  . Alcohol abuse Mother   . Other Brother     died of sepsis  . Gout Brother   . Colon cancer Neg Hx   . Alcohol abuse Father   . Cancer Sister  throat  . Pulmonary embolism Daughter   . Mental illness Daughter     anxiety  . Diabetes Maternal Aunt   . Diabetes Maternal Aunt   . Seizures Maternal Aunt     History   Social History  . Marital Status: Married    Spouse Name: N/A  . Number of Children: N/A  . Years of Education: N/A   Social History Main Topics  . Smoking status: Former Smoker    Quit date: 01/13/1991  . Smokeless tobacco: Never Used     Comment: quit 20 years ago 1/2 ppd x 40 years  . Alcohol Use: No  . Drug Use: No  . Sexual Activity: No   Other Topics Concern  . None   Social History Narrative   Former Smoker - quit 20 years ago (1/2 ppd x 40 years)   Widowed   1 Daughter        Review of Systems - See HPI.  All other ROS are negative.  BP 131/59 mmHg  Pulse 79  Temp(Src) 98.2 F (36.8 C) (Oral)  Ht 5\' 10"  (1.778 m)  Wt 231 lb 6.4 oz (104.962  kg)  BMI 33.20 kg/m2  SpO2 97%  Physical Exam  Constitutional: She is oriented to person, place, and time and well-developed, well-nourished, and in no distress.  HENT:  Head: Normocephalic and atraumatic.  Right Ear: External ear normal.  Left Ear: External ear normal.  Nose: Nose normal.  Mouth/Throat: Oropharynx is clear and moist.  TM within normal limits bilaterally  Eyes: Conjunctivae are normal.  Neck: Neck supple.  Cardiovascular: Normal rate, regular rhythm, normal heart sounds and intact distal pulses.   Pulmonary/Chest: Effort normal and breath sounds normal. No respiratory distress. She has no wheezes. She has no rales. She exhibits no tenderness.  Lymphadenopathy:    She has no cervical adenopathy.  Neurological: She is alert and oriented to person, place, and time.  Skin: Skin is warm and dry. No rash noted.  Psychiatric: Affect normal.  Vitals reviewed.    Assessment/Plan: Acute bacterial bronchitis Rx Doxycyline.  Rx Cheratussin. Increase fluids. Plain Mucinex. Rest. Humidifier in bedroom. Return PRN if symptoms are not resolving.

## 2014-07-12 ENCOUNTER — Ambulatory Visit: Payer: Medicare Other | Admitting: Family Medicine

## 2014-08-16 ENCOUNTER — Telehealth: Payer: Self-pay

## 2014-08-16 NOTE — Telephone Encounter (Signed)
Pre visit call completed 

## 2014-08-17 ENCOUNTER — Ambulatory Visit (INDEPENDENT_AMBULATORY_CARE_PROVIDER_SITE_OTHER): Payer: Medicare Other | Admitting: Family Medicine

## 2014-08-17 ENCOUNTER — Ambulatory Visit (HOSPITAL_BASED_OUTPATIENT_CLINIC_OR_DEPARTMENT_OTHER)
Admission: RE | Admit: 2014-08-17 | Discharge: 2014-08-17 | Disposition: A | Discharge status: Home / Self Care | Payer: Medicare Other | Source: Ambulatory Visit | Attending: Family Medicine | Admitting: Family Medicine

## 2014-08-17 ENCOUNTER — Encounter: Payer: Self-pay | Admitting: Family Medicine

## 2014-08-17 VITALS — BP 124/72 | HR 74 | Temp 98.0°F | Ht 70.0 in | Wt 229.5 lb

## 2014-08-17 DIAGNOSIS — M25551 Pain in right hip: Secondary | ICD-10-CM | POA: Diagnosis present

## 2014-08-17 DIAGNOSIS — Z1239 Encounter for other screening for malignant neoplasm of breast: Secondary | ICD-10-CM

## 2014-08-17 DIAGNOSIS — B9689 Other specified bacterial agents as the cause of diseases classified elsewhere: Secondary | ICD-10-CM

## 2014-08-17 DIAGNOSIS — H9193 Unspecified hearing loss, bilateral: Secondary | ICD-10-CM

## 2014-08-17 DIAGNOSIS — M8580 Other specified disorders of bone density and structure, unspecified site: Secondary | ICD-10-CM | POA: Diagnosis not present

## 2014-08-17 DIAGNOSIS — Z0000 Encounter for general adult medical examination without abnormal findings: Secondary | ICD-10-CM

## 2014-08-17 DIAGNOSIS — I10 Essential (primary) hypertension: Secondary | ICD-10-CM | POA: Diagnosis not present

## 2014-08-17 DIAGNOSIS — D259 Leiomyoma of uterus, unspecified: Secondary | ICD-10-CM | POA: Diagnosis not present

## 2014-08-17 DIAGNOSIS — J00 Acute nasopharyngitis [common cold]: Secondary | ICD-10-CM

## 2014-08-17 DIAGNOSIS — M810 Age-related osteoporosis without current pathological fracture: Secondary | ICD-10-CM | POA: Insufficient documentation

## 2014-08-17 DIAGNOSIS — H269 Unspecified cataract: Secondary | ICD-10-CM

## 2014-08-17 DIAGNOSIS — H547 Unspecified visual loss: Secondary | ICD-10-CM

## 2014-08-17 DIAGNOSIS — J208 Acute bronchitis due to other specified organisms: Secondary | ICD-10-CM

## 2014-08-17 DIAGNOSIS — E782 Mixed hyperlipidemia: Secondary | ICD-10-CM

## 2014-08-17 HISTORY — DX: Other specified disorders of bone density and structure, unspecified site: M85.80

## 2014-08-17 LAB — CBC
HCT: 40.7 % (ref 36.0–46.0)
Hemoglobin: 13.4 g/dL (ref 12.0–15.0)
MCHC: 32.9 g/dL (ref 30.0–36.0)
MCV: 86.5 fl (ref 78.0–100.0)
PLATELETS: 224 10*3/uL (ref 150.0–400.0)
RBC: 4.71 Mil/uL (ref 3.87–5.11)
RDW: 15 % (ref 11.5–15.5)
WBC: 7.1 10*3/uL (ref 4.0–10.5)

## 2014-08-17 LAB — TSH: TSH: 3.06 u[IU]/mL (ref 0.35–4.50)

## 2014-08-17 LAB — COMPREHENSIVE METABOLIC PANEL
ALBUMIN: 4 g/dL (ref 3.5–5.2)
ALT: 13 U/L (ref 0–35)
AST: 15 U/L (ref 0–37)
Alkaline Phosphatase: 62 U/L (ref 39–117)
BUN: 14 mg/dL (ref 6–23)
CHLORIDE: 105 meq/L (ref 96–112)
CO2: 28 meq/L (ref 19–32)
Calcium: 9.3 mg/dL (ref 8.4–10.5)
Creatinine, Ser: 1.1 mg/dL (ref 0.40–1.20)
GFR: 62.35 mL/min (ref >60.00)
Glucose, Bld: 88 mg/dL (ref 70–99)
Potassium: 3.8 mEq/L (ref 3.5–5.1)
SODIUM: 142 meq/L (ref 135–145)
TOTAL PROTEIN: 7.8 g/dL (ref 6.0–8.3)
Total Bilirubin: 0.5 mg/dL (ref 0.2–1.2)

## 2014-08-17 LAB — LIPID PANEL
CHOL/HDL RATIO: 5
Cholesterol: 180 mg/dL (ref 0–200)
HDL: 39.5 mg/dL (ref >39.00)
LDL Cholesterol: 122 mg/dL — ABNORMAL HIGH (ref 0–99)
NONHDL: 140.01
Triglycerides: 91 mg/dL (ref 0.0–149.0)
VLDL: 18.2 mg/dL (ref 0.0–40.0)

## 2014-08-17 LAB — VITAMIN D 25 HYDROXY (VIT D DEFICIENCY, FRACTURES): VITD: 25.9 ng/mL — AB (ref 30.00–100.00)

## 2014-08-17 MED ORDER — TRAMADOL HCL 50 MG PO TABS: 50.0000 mg | ORAL_TABLET | Freq: Three times a day (TID) | ORAL | Status: DC | PRN

## 2014-08-17 MED ORDER — AMLODIPINE BESYLATE 10 MG PO TABS: ORAL_TABLET | ORAL | Status: DC

## 2014-08-17 MED ORDER — RANITIDINE HCL 150 MG PO CAPS: 150.0000 mg | ORAL_CAPSULE | Freq: Every day | ORAL | Status: DC

## 2014-08-17 MED ORDER — DICLOFENAC SODIUM 1 % TD GEL: 1.0000 "application " | Freq: Three times a day (TID) | TRANSDERMAL | Status: DC

## 2014-08-17 MED ORDER — LEVOCETIRIZINE DIHYDROCHLORIDE 5 MG PO TABS: 5.0000 mg | ORAL_TABLET | Freq: Every evening | ORAL | Status: DC

## 2014-08-17 NOTE — Progress Notes (Signed)
Pre visit review using our clinic review tool, if applicable. No additional management support is needed unless otherwise documented below in the visit note. 

## 2014-08-17 NOTE — Patient Instructions (Addendum)
Vitamin D 2,000 IU over the counter daily  Probiotic NOW brand can be found in stores and online at [email or web address]  Preventive Care for Adults A healthy lifestyle and preventive care can promote health and wellness. Preventive health guidelines for women include the following key practices.  A routine yearly physical is a good way to check with your health care provider about your health and preventive screening. It is a chance to share any concerns and updates on your health and to receive a thorough exam.  Visit your dentist for a routine exam and preventive care every 6 months. Brush your teeth twice a day and floss once a day. Good oral hygiene prevents tooth decay and gum disease.  The frequency of eye exams is based on your age, health, family medical history, use of contact lenses, and other factors. Follow your health care provider's recommendations for frequency of eye exams.  Eat a healthy diet. Foods like vegetables, fruits, whole grains, low-fat dairy products, and lean protein foods contain the nutrients you need without too many calories. Decrease your intake of foods high in solid fats, added sugars, and salt. Eat the right amount of calories for you.Get information about a proper diet from your health care provider, if necessary.  Regular physical exercise is one of the most important things you can do for your health. Most adults should get at least 150 minutes of moderate-intensity exercise (any activity that increases your heart rate and causes you to sweat) each week. In addition, most adults need muscle-strengthening exercises on 2 or more days a week.  Maintain a healthy weight. The body mass index (BMI) is a screening tool to identify possible weight problems. It provides an estimate of body fat based on height and weight. Your health care provider can find your BMI and can help you achieve or maintain a healthy weight.For adults 20 years and older:  A BMI below 18.5 is  considered underweight.  A BMI of 18.5 to 24.9 is normal.  A BMI of 25 to 29.9 is considered overweight.  A BMI of 30 and above is considered obese.  Maintain normal blood lipids and cholesterol levels by exercising and minimizing your intake of saturated fat. Eat a balanced diet with plenty of fruit and vegetables. Blood tests for lipids and cholesterol should begin at age 28 and be repeated every 5 years. If your lipid or cholesterol levels are high, you are over 50, or you are at high risk for heart disease, you may need your cholesterol levels checked more frequently.Ongoing high lipid and cholesterol levels should be treated with medicines if diet and exercise are not working.  If you smoke, find out from your health care provider how to quit. If you do not use tobacco, do not start.  Lung cancer screening is recommended for adults aged 21-80 years who are at high risk for developing lung cancer because of a history of smoking. A yearly low-dose CT scan of the lungs is recommended for people who have at least a 30-pack-year history of smoking and are a current smoker or have quit within the past 15 years. A pack year of smoking is smoking an average of 1 pack of cigarettes a day for 1 year (for example: 1 pack a day for 30 years or 2 packs a day for 15 years). Yearly screening should continue until the smoker has stopped smoking for at least 15 years. Yearly screening should be stopped for people who develop a health problem  that would prevent them from having lung cancer treatment.  If you are pregnant, do not drink alcohol. If you are breastfeeding, be very cautious about drinking alcohol. If you are not pregnant and choose to drink alcohol, do not have more than 1 drink per day. One drink is considered to be 12 ounces (355 mL) of beer, 5 ounces (148 mL) of wine, or 1.5 ounces (44 mL) of liquor.  Avoid use of street drugs. Do not share needles with anyone. Ask for help if you need support or  instructions about stopping the use of drugs.  High blood pressure causes heart disease and increases the risk of stroke. Your blood pressure should be checked at least every 1 to 2 years. Ongoing high blood pressure should be treated with medicines if weight loss and exercise do not work.  If you are 17-100 years old, ask your health care provider if you should take aspirin to prevent strokes.  Diabetes screening involves taking a blood sample to check your fasting blood sugar level. This should be done once every 3 years, after age 39, if you are within normal weight and without risk factors for diabetes. Testing should be considered at a younger age or be carried out more frequently if you are overweight and have at least 1 risk factor for diabetes.  Breast cancer screening is essential preventive care for women. You should practice "breast self-awareness." This means understanding the normal appearance and feel of your breasts and may include breast self-examination. Any changes detected, no matter how small, should be reported to a health care provider. Women in their 39s and 30s should have a clinical breast exam (CBE) by a health care provider as part of a regular health exam every 1 to 3 years. After age 69, women should have a CBE every year. Starting at age 32, women should consider having a mammogram (breast X-ray test) every year. Women who have a family history of breast cancer should talk to their health care provider about genetic screening. Women at a high risk of breast cancer should talk to their health care providers about having an MRI and a mammogram every year.  Breast cancer gene (BRCA)-related cancer risk assessment is recommended for women who have family members with BRCA-related cancers. BRCA-related cancers include breast, ovarian, tubal, and peritoneal cancers. Having family members with these cancers may be associated with an increased risk for harmful changes (mutations) in  the breast cancer genes BRCA1 and BRCA2. Results of the assessment will determine the need for genetic counseling and BRCA1 and BRCA2 testing.  Routine pelvic exams to screen for cancer are no longer recommended for nonpregnant women who are considered low risk for cancer of the pelvic organs (ovaries, uterus, and vagina) and who do not have symptoms. Ask your health care provider if a screening pelvic exam is right for you.  If you have had past treatment for cervical cancer or a condition that could lead to cancer, you need Pap tests and screening for cancer for at least 20 years after your treatment. If Pap tests have been discontinued, your risk factors (such as having a new sexual partner) need to be reassessed to determine if screening should be resumed. Some women have medical problems that increase the chance of getting cervical cancer. In these cases, your health care provider may recommend more frequent screening and Pap tests.  The HPV test is an additional test that may be used for cervical cancer screening. The HPV test  looks for the virus that can cause the cell changes on the cervix. The cells collected during the Pap test can be tested for HPV. The HPV test could be used to screen women aged 23 years and older, and should be used in women of any age who have unclear Pap test results. After the age of 28, women should have HPV testing at the same frequency as a Pap test.  Colorectal cancer can be detected and often prevented. Most routine colorectal cancer screening begins at the age of 93 years and continues through age 46 years. However, your health care provider may recommend screening at an earlier age if you have risk factors for colon cancer. On a yearly basis, your health care provider may provide home test kits to check for hidden blood in the stool. Use of a small camera at the end of a tube, to directly examine the colon (sigmoidoscopy or colonoscopy), can detect the earliest forms  of colorectal cancer. Talk to your health care provider about this at age 29, when routine screening begins. Direct exam of the colon should be repeated every 5-10 years through age 17 years, unless early forms of pre-cancerous polyps or small growths are found.  People who are at an increased risk for hepatitis B should be screened for this virus. You are considered at high risk for hepatitis B if:  You were born in a country where hepatitis B occurs often. Talk with your health care provider about which countries are considered high risk.  Your parents were born in a high-risk country and you have not received a shot to protect against hepatitis B (hepatitis B vaccine).  You have HIV or AIDS.  You use needles to inject street drugs.  You live with, or have sex with, someone who has hepatitis B.  You get hemodialysis treatment.  You take certain medicines for conditions like cancer, organ transplantation, and autoimmune conditions.  Hepatitis C blood testing is recommended for all people born from [birth year] through 1965 and any individual with known risks for hepatitis C.  Practice safe sex. Use condoms and avoid high-risk sexual practices to reduce the spread of sexually transmitted infections (STIs). STIs include gonorrhea, chlamydia, syphilis, trichomonas, herpes, HPV, and human immunodeficiency virus (HIV). Herpes, HIV, and HPV are viral illnesses that have no cure. They can result in disability, cancer, and death.  You should be screened for sexually transmitted illnesses (STIs) including gonorrhea and chlamydia if:  You are sexually active and are younger than 24 years.  You are older than 24 years and your health care provider tells you that you are at risk for this type of infection.  Your sexual activity has changed since you were last screened and you are at an increased risk for chlamydia or gonorrhea. Ask your health care provider if you are at risk.  If you are at risk of  being infected with HIV, it is recommended that you take a prescription medicine daily to prevent HIV infection. This is called preexposure prophylaxis (PrEP). You are considered at risk if:  You are a heterosexual woman, are sexually active, and are at increased risk for HIV infection.  You take drugs by injection.  You are sexually active with a partner who has HIV.  Talk with your health care provider about whether you are at high risk of being infected with HIV. If you choose to begin PrEP, you should first be tested for HIV. You should then be tested every 3  months for as long as you are taking PrEP.  Osteoporosis is a disease in which the bones lose minerals and strength with aging. This can result in serious bone fractures or breaks. The risk of osteoporosis can be identified using a bone density scan. Women ages 43 years and over and women at risk for fractures or osteoporosis should discuss screening with their health care providers. Ask your health care provider whether you should take a calcium supplement or vitamin D to reduce the rate of osteoporosis.  Menopause can be associated with physical symptoms and risks. Hormone replacement therapy is available to decrease symptoms and risks. You should talk to your health care provider about whether hormone replacement therapy is right for you.  Use sunscreen. Apply sunscreen liberally and repeatedly throughout the day. You should seek shade when your shadow is shorter than you. Protect yourself by wearing long sleeves, pants, a wide-brimmed hat, and sunglasses year round, whenever you are outdoors.  Once a month, do a whole body skin exam, using a mirror to look at the skin on your back. Tell your health care provider of new moles, moles that have irregular borders, moles that are larger than a pencil eraser, or moles that have changed in shape or color.  Stay current with required vaccines (immunizations).  Influenza vaccine. All adults  should be immunized every year.  Tetanus, diphtheria, and acellular pertussis (Td, Tdap) vaccine. Pregnant women should receive 1 dose of Tdap vaccine during each pregnancy. The dose should be obtained regardless of the length of time since the last dose. Immunization is preferred during the 27th-36th week of gestation. An adult who has not previously received Tdap or who does not know her vaccine status should receive 1 dose of Tdap. This initial dose should be followed by tetanus and diphtheria toxoids (Td) booster doses every 10 years. Adults with an unknown or incomplete history of completing a 3-dose immunization series with Td-containing vaccines should begin or complete a primary immunization series including a Tdap dose. Adults should receive a Td booster every 10 years.  Varicella vaccine. An adult without evidence of immunity to varicella should receive 2 doses or a second dose if she has previously received 1 dose. Pregnant females who do not have evidence of immunity should receive the first dose after pregnancy. This first dose should be obtained before leaving the health care facility. The second dose should be obtained 4-8 weeks after the first dose.  Human papillomavirus (HPV) vaccine. Females aged 13-26 years who have not received the vaccine previously should obtain the 3-dose series. The vaccine is not recommended for use in pregnant females. However, pregnancy testing is not needed before receiving a dose. If a female is found to be pregnant after receiving a dose, no treatment is needed. In that case, the remaining doses should be delayed until after the pregnancy. Immunization is recommended for any person with an immunocompromised condition through the age of 47 years if she did not get any or all doses earlier. During the 3-dose series, the second dose should be obtained 4-8 weeks after the first dose. The third dose should be obtained 24 weeks after the first dose and 16 weeks after  the second dose.  Zoster vaccine. One dose is recommended for adults aged 58 years or older unless certain conditions are present.  Measles, mumps, and rubella (MMR) vaccine. Adults born before [birth year] generally are considered immune to measles and mumps. Adults born in [birth year] or later should have 1  or more doses of MMR vaccine unless there is a contraindication to the vaccine or there is laboratory evidence of immunity to each of the three diseases. A routine second dose of MMR vaccine should be obtained at least 28 days after the first dose for students attending postsecondary schools, health care workers, or international travelers. People who received inactivated measles vaccine or an unknown type of measles vaccine during 1963-1967 should receive 2 doses of MMR vaccine. People who received inactivated mumps vaccine or an unknown type of mumps vaccine before 1979 and are at high risk for mumps infection should consider immunization with 2 doses of MMR vaccine. For females of childbearing age, rubella immunity should be determined. If there is no evidence of immunity, females who are not pregnant should be vaccinated. If there is no evidence of immunity, females who are pregnant should delay immunization until after pregnancy. Unvaccinated health care workers born before [birth year] who lack laboratory evidence of measles, mumps, or rubella immunity or laboratory confirmation of disease should consider measles and mumps immunization with 2 doses of MMR vaccine or rubella immunization with 1 dose of MMR vaccine.  Pneumococcal 13-valent conjugate (PCV13) vaccine. When indicated, a person who is uncertain of her immunization history and has no record of immunization should receive the PCV13 vaccine. An adult aged 55 years or older who has certain medical conditions and has not been previously immunized should receive 1 dose of PCV13 vaccine. This PCV13 should be followed with a dose of pneumococcal polysaccharide (PPSV23)  vaccine. The PPSV23 vaccine dose should be obtained at least 8 weeks after the dose of PCV13 vaccine. An adult aged 63 years or older who has certain medical conditions and previously received 1 or more doses of PPSV23 vaccine should receive 1 dose of PCV13. The PCV13 vaccine dose should be obtained 1 or more years after the last PPSV23 vaccine dose.  Pneumococcal polysaccharide (PPSV23) vaccine. When PCV13 is also indicated, PCV13 should be obtained first. All adults aged 76 years and older should be immunized. An adult younger than age 66 years who has certain medical conditions should be immunized. Any person who resides in a nursing home or long-term care facility should be immunized. An adult smoker should be immunized. People with an immunocompromised condition and certain other conditions should receive both PCV13 and PPSV23 vaccines. People with human immunodeficiency virus (HIV) infection should be immunized as soon as possible after diagnosis. Immunization during chemotherapy or radiation therapy should be avoided. Routine use of PPSV23 vaccine is not recommended for American Indians, [place] Natives, or people younger than 65 years unless there are medical conditions that require PPSV23 vaccine. When indicated, people who have unknown immunization and have no record of immunization should receive PPSV23 vaccine. One-time revaccination 5 years after the first dose of PPSV23 is recommended for people aged 19-64 years who have chronic kidney failure, nephrotic syndrome, asplenia, or immunocompromised conditions. People who received 1-2 doses of PPSV23 before age 9 years should receive another dose of PPSV23 vaccine at age 76 years or later if at least 5 years have passed since the previous dose. Doses of PPSV23 are not needed for people immunized with PPSV23 at or after age 17 years.  Meningococcal vaccine. Adults with asplenia or persistent complement component deficiencies should receive 2 doses of  quadrivalent meningococcal conjugate (MenACWY-D) vaccine. The doses should be obtained at least 2 months apart. Microbiologists working with certain meningococcal bacteria, [employer] recruits, people at risk during an outbreak, and people who  travel to or live in countries with a high rate of meningitis should be immunized. A first-year college student up through age 63 years who is living in a residence hall should receive a dose if she did not receive a dose on or after her 16th birthday. Adults who have certain high-risk conditions should receive one or more doses of vaccine.  Hepatitis A vaccine. Adults who wish to be protected from this disease, have certain high-risk conditions, work with hepatitis A-infected animals, work in hepatitis A research labs, or travel to or work in countries with a high rate of hepatitis A should be immunized. Adults who were previously unvaccinated and who anticipate close contact with an international adoptee during the first 60 days after arrival in the [place] from a country with a high rate of hepatitis A should be immunized.  Hepatitis B vaccine. Adults who wish to be protected from this disease, have certain high-risk conditions, may be exposed to blood or other infectious body fluids, are household contacts or sex partners of hepatitis B positive people, are clients or workers in certain care facilities, or travel to or work in countries with a high rate of hepatitis B should be immunized.  Haemophilus influenzae type b (Hib) vaccine. A previously unvaccinated person with asplenia or sickle cell disease or having a scheduled splenectomy should receive 1 dose of Hib vaccine. Regardless of previous immunization, a recipient of a hematopoietic stem cell transplant should receive a 3-dose series 6-12 months after her successful transplant. Hib vaccine is not recommended for adults with HIV infection. Preventive Services / Frequency Ages 31 to 10 years  Blood  pressure check.** / Every 1 to 2 years.  Lipid and cholesterol check.** / Every 5 years beginning at age 47.  Clinical breast exam.** / Every 3 years for women in their 21s and 88s.  BRCA-related cancer risk assessment.** / For women who have family members with a BRCA-related cancer (breast, ovarian, tubal, or peritoneal cancers).  Pap test.** / Every 2 years from ages 76 through 11. Every 3 years starting at age 45 through age 52 or 66 with a history of 3 consecutive normal Pap tests.  HPV screening.** / Every 3 years from ages 31 through ages 23 to 85 with a history of 3 consecutive normal Pap tests.  Hepatitis C blood test.** / For any individual with known risks for hepatitis C.  Skin self-exam. / Monthly.  Influenza vaccine. / Every year.  Tetanus, diphtheria, and acellular pertussis (Tdap, Td) vaccine.** / Consult your health care provider. Pregnant women should receive 1 dose of Tdap vaccine during each pregnancy. 1 dose of Td every 10 years.  Varicella vaccine.** / Consult your health care provider. Pregnant females who do not have evidence of immunity should receive the first dose after pregnancy.  HPV vaccine. / 3 doses over 6 months, if 6 and younger. The vaccine is not recommended for use in pregnant females. However, pregnancy testing is not needed before receiving a dose.  Measles, mumps, rubella (MMR) vaccine.** / You need at least 1 dose of MMR if you were born in 1957 or later. You may also need a 2nd dose. For females of childbearing age, rubella immunity should be determined. If there is no evidence of immunity, females who are not pregnant should be vaccinated. If there is no evidence of immunity, females who are pregnant should delay immunization until after pregnancy.  Pneumococcal 13-valent conjugate (PCV13) vaccine.** / Consult your health care provider.  Pneumococcal polysaccharide (PPSV23) vaccine.** / 1 to 2 doses if you smoke cigarettes or if you have certain  conditions.  Meningococcal vaccine.** / 1 dose if you are age 19 to 21 years and a first-year college student living in a residence hall, or have one of several medical conditions, you need to get vaccinated against meningococcal disease. You may also need additional booster doses.  Hepatitis A vaccine.** / Consult your health care provider.  Hepatitis B vaccine.** / Consult your health care provider.  Haemophilus influenzae type b (Hib) vaccine.** / Consult your health care provider. Ages 40 to 64 years  Blood pressure check.** / Every 1 to 2 years.  Lipid and cholesterol check.** / Every 5 years beginning at age 20 years.  Lung cancer screening. / Every year if you are aged 55-80 years and have a 30-pack-year history of smoking and currently smoke or have quit within the past 15 years. Yearly screening is stopped once you have quit smoking for at least 15 years or develop a health problem that would prevent you from having lung cancer treatment.  Clinical breast exam.** / Every year after age 40 years.  BRCA-related cancer risk assessment.** / For women who have family members with a BRCA-related cancer (breast, ovarian, tubal, or peritoneal cancers).  Mammogram.** / Every year beginning at age 40 years and continuing for as long as you are in good health. Consult with your health care provider.  Pap test.** / Every 3 years starting at age 30 years through age 65 or 70 years with a history of 3 consecutive normal Pap tests.  HPV screening.** / Every 3 years from ages 30 years through ages 65 to 70 years with a history of 3 consecutive normal Pap tests.  Fecal occult blood test (FOBT) of stool. / Every year beginning at age 50 years and continuing until age 75 years. You may not need to do this test if you get a colonoscopy every 10 years.  Flexible sigmoidoscopy or colonoscopy.** / Every 5 years for a flexible sigmoidoscopy or every 10 years for a colonoscopy beginning at age 50 years  and continuing until age 75 years.  Hepatitis C blood test.** / For all people born from 1945 through 1965 and any individual with known risks for hepatitis C.  Skin self-exam. / Monthly.  Influenza vaccine. / Every year.  Tetanus, diphtheria, and acellular pertussis (Tdap/Td) vaccine.** / Consult your health care provider. Pregnant women should receive 1 dose of Tdap vaccine during each pregnancy. 1 dose of Td every 10 years.  Varicella vaccine.** / Consult your health care provider. Pregnant females who do not have evidence of immunity should receive the first dose after pregnancy.  Zoster vaccine.** / 1 dose for adults aged 60 years or older.  Measles, mumps, rubella (MMR) vaccine.** / You need at least 1 dose of MMR if you were born in 1957 or later. You may also need a 2nd dose. For females of childbearing age, rubella immunity should be determined. If there is no evidence of immunity, females who are not pregnant should be vaccinated. If there is no evidence of immunity, females who are pregnant should delay immunization until after pregnancy.  Pneumococcal 13-valent conjugate (PCV13) vaccine.** / Consult your health care provider.  Pneumococcal polysaccharide (PPSV23) vaccine.** / 1 to 2 doses if you smoke cigarettes or if you have certain conditions.  Meningococcal vaccine.** / Consult your health care provider.  Hepatitis A vaccine.** / Consult your health care provider.  Hepatitis   B vaccine.** / Consult your health care provider.  Haemophilus influenzae type b (Hib) vaccine.** / Consult your health care provider. Ages 48 years and over  Blood pressure check.** / Every 1 to 2 years.  Lipid and cholesterol check.** / Every 5 years beginning at age 39 years.  Lung cancer screening. / Every year if you are aged 17-80 years and have a 30-pack-year history of smoking and currently smoke or have quit within the past 15 years. Yearly screening is stopped once you have quit smoking  for at least 15 years or develop a health problem that would prevent you from having lung cancer treatment.  Clinical breast exam.** / Every year after age 65 years.  BRCA-related cancer risk assessment.** / For women who have family members with a BRCA-related cancer (breast, ovarian, tubal, or peritoneal cancers).  Mammogram.** / Every year beginning at age 53 years and continuing for as long as you are in good health. Consult with your health care provider.  Pap test.** / Every 3 years starting at age 78 years through age 58 or 59 years with 3 consecutive normal Pap tests. Testing can be stopped between 65 and 70 years with 3 consecutive normal Pap tests and no abnormal Pap or HPV tests in the past 10 years.  HPV screening.** / Every 3 years from ages 34 years through ages 36 or 22 years with a history of 3 consecutive normal Pap tests. Testing can be stopped between 65 and 70 years with 3 consecutive normal Pap tests and no abnormal Pap or HPV tests in the past 10 years.  Fecal occult blood test (FOBT) of stool. / Every year beginning at age 72 years and continuing until age 56 years. You may not need to do this test if you get a colonoscopy every 10 years.  Flexible sigmoidoscopy or colonoscopy.** / Every 5 years for a flexible sigmoidoscopy or every 10 years for a colonoscopy beginning at age 32 years and continuing until age 1 years.  Hepatitis C blood test.** / For all people born from [birth year] through 1965 and any individual with known risks for hepatitis C.  Osteoporosis screening.** / A one-time screening for women ages 101 years and over and women at risk for fractures or osteoporosis.  Skin self-exam. / Monthly.  Influenza vaccine. / Every year.  Tetanus, diphtheria, and acellular pertussis (Tdap/Td) vaccine.** / 1 dose of Td every 10 years.  Varicella vaccine.** / Consult your health care provider.  Zoster vaccine.** / 1 dose for adults aged 46 years or older.  Pneumococcal  13-valent conjugate (PCV13) vaccine.** / Consult your health care provider.  Pneumococcal polysaccharide (PPSV23) vaccine.** / 1 dose for all adults aged 40 years and older.  Meningococcal vaccine.** / Consult your health care provider.  Hepatitis A vaccine.** / Consult your health care provider.  Hepatitis B vaccine.** / Consult your health care provider.  Haemophilus influenzae type b (Hib) vaccine.** / Consult your health care provider. ** Family history and personal history of risk and conditions may change your health care provider's recommendations. Document Released: 02/24/2001 Document Revised: 05/15/2013 Document Reviewed: 05/26/2010 [facility] Patient Information 2015 [place], [place]. This information is not intended to replace advice given to you by your health care provider. Make sure you discuss any questions you have with your health care provider.

## 2014-08-20 ENCOUNTER — Other Ambulatory Visit: Payer: Self-pay | Admitting: Family Medicine

## 2014-08-20 MED ORDER — ERGOCALCIFEROL 1.25 MG (50000 UT) PO CAPS: 50000.0000 [IU] | ORAL_CAPSULE | ORAL | Status: DC

## 2014-09-02 ENCOUNTER — Encounter: Payer: Self-pay | Admitting: Family Medicine

## 2014-09-02 DIAGNOSIS — M25552 Pain in left hip: Secondary | ICD-10-CM | POA: Insufficient documentation

## 2014-09-02 DIAGNOSIS — H9190 Unspecified hearing loss, unspecified ear: Secondary | ICD-10-CM | POA: Insufficient documentation

## 2014-09-02 DIAGNOSIS — M25551 Pain in right hip: Secondary | ICD-10-CM | POA: Insufficient documentation

## 2014-09-02 DIAGNOSIS — Z0000 Encounter for general adult medical examination without abnormal findings: Secondary | ICD-10-CM

## 2014-09-02 DIAGNOSIS — H547 Unspecified visual loss: Secondary | ICD-10-CM | POA: Insufficient documentation

## 2014-09-02 DIAGNOSIS — Z1239 Encounter for other screening for malignant neoplasm of breast: Secondary | ICD-10-CM | POA: Insufficient documentation

## 2014-09-02 HISTORY — DX: Encounter for general adult medical examination without abnormal findings: Z00.00

## 2014-09-02 NOTE — Assessment & Plan Note (Signed)
Referred to audiology for further consideration. 

## 2014-09-02 NOTE — Assessment & Plan Note (Signed)
Well controlled, no changes to meds. Encouraged heart healthy diet such as the DASH diet and exercise as tolerated.  °

## 2014-09-02 NOTE — Assessment & Plan Note (Signed)
With vit d def encouraged supplementation, stay active

## 2014-09-02 NOTE — Assessment & Plan Note (Signed)
Patient denies any difficulties at home. No trouble with ADLs, depression or falls. No recent changes to vision or hearing. Is UTD with immunizations. Is UTD with screening. Discussed Advanced Directives, patient agrees to bring [place] copies of documents if can. Encouraged heart healthy diet, exercise as tolerated and adequate sleep. Increase leafy greens, consider increased lean red meat and using cast iron cookware. Continue to monitor, report any concerns Immunization Status: Prompted for insurance verification: Y Flu vaccine-- Tdap--DUE PNA--09/21/13, 10/31/10 Shingles--DUE  A/P:  Changes to FH, PSH or Personal Hx:N Pap--N/A MMG--DUE Bone Density--completed CCS--10/06/11  Care Teams Updated:Y ED/Hospital/Urgent Care Visits:N Prompted for: Updated insurance, contact information, forms: Remind to bring: DPR information, advance directives:   See problem list for risk factors See AVS for preventative health scedule

## 2014-09-02 NOTE — Assessment & Plan Note (Signed)
Tolerating statin, encouraged heart healthy diet, avoid trans fats, minimize simple carbs and saturated fats. Increase exercise as tolerated 

## 2014-09-02 NOTE — Assessment & Plan Note (Signed)
Referred for [facility]

## 2014-09-02 NOTE — Progress Notes (Signed)
Terri Mckee [id number] 07-31-1940 09/02/2014      Progress Note New Patient  Subjective  Chief Complaint  Chief Complaint  Patient presents with  . Medicare Wellness    HPI  Patient is a 74 year old female in today for routine medical care. She is in need of medicare wellness exam and follow up on numerous concerns. Has recovered from recent respiratory infection. Only mild nasal congestion. No sore throat or ear pain. Is noting low back pain, varying in intensity. No recent falls or acute injury. Suffers from low back pain. No recent illness. Well controlled, no changes to meds. Encouraged heart healthy diet such as the DASH diet and exercise as tolerated.   Past Medical History  Diagnosis Date  . Hypertension   . 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 History of cyst of breast   . 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 Colon polyps   . Elevated serum creatinine     with ACE inhibitor (Normal MRA of renal arteries)  . Arthritis     back  . GERD (gastroesophageal reflux disease)   . Cataract   . Hoarseness of voice 03/13/2012  . Cervical cancer screening 07/07/2012  . Skin lesion 07/10/2012  . Urinary incontinence 07/10/2012  . Acute upper respiratory infection 04/09/2014  . Osteopenia 08/17/2014    Past Surgical History  Procedure Laterality Date  . Ovary surgery      right ovary & tube replacement  . Wrist ganglion excision      rt  . Appendectomy    . Breast cyst excision      right breast, twice    Family History  Problem Relation Age of Onset  . Brain cancer Mother     deceased age 96  . Alcohol abuse Mother   . Other Brother     died of sepsis  . Gout Brother   . Colon cancer Neg Hx   . Alcohol abuse Father   . Cancer Sister     throat  . Pulmonary embolism Daughter   . Mental illness Daughter     anxiety  . Diabetes Maternal Aunt   . Diabetes Maternal Aunt   . Seizures Maternal Aunt    . Birth defects Maternal Aunt     Social History   Social History  . Marital Status: Married    Spouse Name: N/A  . Number of Children: N/A  . Years of Education: N/A   Occupational History  . Not on file.   Social History Main Topics  . Smoking status: Former Smoker    Quit date: 01/13/1991  . Smokeless tobacco: Never Used     Comment: quit 20 years ago 1/2 ppd x 40 years  . Alcohol Use: No  . Drug Use: No  . Sexual Activity: No     Comment: lives with husband, no dietary restrictions,    Other Topics Concern  . Not on file   Social History Narrative   Former Smoker - quit 20 years ago (1/2 ppd x 40 years)   Widowed   1 Daughter         Current Outpatient Prescriptions on File Prior to Visit  Medication Sig Dispense Refill  . aspirin 81 MG tablet Take 81 mg by mouth daily.      . calcium carbonate (OS-CAL) 600 MG TABS Take 600 mg by mouth daily.      Marland Kitchen  fluticasone (FLONASE) 50 MCG/ACT nasal spray Place 2 sprays into the nose daily. 16 g 2  . Garlic 614 MG CAPS Take 1 capsule by mouth daily.    Marland Kitchen HYDROcodone-acetaminophen (NORCO/VICODIN) 5-325 MG per tablet Take 1 tablet by mouth every 4 (four) hours as needed for moderate pain or severe pain. 20 tablet 0  . Multiple Vitamin (MULTIVITAMIN) tablet Take 1 tablet by mouth daily.    . Omega-3 Fatty Acids (FISH OIL) 1200 MG CAPS Take 1,200 mg by mouth. 2 capsules by mouth once daily in the morning     . Omeprazole ([employer]S OMEPRAZOLE) 20 MG TBEC Take 1 tablet (20 mg total) by mouth daily. 1 capsule by mouth once daily 30 minutes before meals 30 each 5  . pravastatin (PRAVACHOL) 40 MG tablet TAKE 1 TABLET (40 MG TOTAL) BY MOUTH DAILY. 90 tablet 2  . SYMBICORT 80-4.5 MCG/ACT inhaler INHALE 2 PUFFS INTO THE LUNGS 2 (TWO) TIMES DAILY. 10.2 g 2  . diazepam (VALIUM) 5 MG tablet Take 0.5 tablets (2.5 mg total) by mouth every 8 (eight) hours as needed for muscle spasms. (Patient not taking: Reported on 08/17/2014) 10 tablet 0   No  current facility-administered medications on file prior to visit.    Allergies  Allergen Reactions  . Fexofenadine Shortness Of Breath  . Erythromycin Nausea Only  . Oxycodone-Acetaminophen Nausea And Vomiting  . Propoxyphene N-Acetaminophen     REACTION: severe vomiting  . Simvastatin     REACTION: Myalgia    Review of Systems  Review of Systems  Constitutional: Positive for malaise/fatigue. Negative for fever and chills.  HENT: Negative for congestion and hearing loss.   Eyes: Negative for discharge.  Respiratory: Negative for cough, sputum production and shortness of breath.   Cardiovascular: Negative for chest pain, palpitations and leg swelling.  Gastrointestinal: Negative for heartburn, nausea, vomiting, abdominal pain, diarrhea, constipation and blood in stool.  Genitourinary: Negative for dysuria, urgency, frequency and hematuria.  Musculoskeletal: Positive for myalgias, back pain and neck pain. Negative for falls.  Skin: Negative for rash.  Neurological: Negative for dizziness, sensory change, loss of consciousness, weakness and headaches.  Endo/Heme/Allergies: Negative for environmental allergies. Does not bruise/bleed easily.  Psychiatric/Behavioral: Negative for depression and suicidal ideas. The patient is not nervous/anxious and does not have insomnia.     Objective  BP 124/72 mmHg  Pulse 74  Temp(Src) 98 F (36.7 C) (Oral)  Ht 5\' 10"  (1.778 m)  Wt 229 lb 8 oz (104.101 kg)  BMI 32.93 kg/m2  SpO2 97%  Physical Exam  Physical Exam  Constitutional: She is oriented to person, place, and time and well-developed, well-nourished, and in no distress. No distress.  HENT:  Head: Normocephalic and atraumatic.  Right Ear: External ear normal.  Left Ear: External ear normal.  Nose: Nose normal.  Mouth/Throat: Oropharynx is clear and moist. No oropharyngeal exudate.  Eyes: Conjunctivae are normal. Pupils are equal, round, and reactive to light. Right eye exhibits  no discharge. Left eye exhibits no discharge. No scleral icterus.  Neck: Normal range of motion. Neck supple. No thyromegaly present.  Cardiovascular: Normal rate, regular rhythm, normal heart sounds and intact distal pulses.   No murmur heard. Pulmonary/Chest: Effort normal and breath sounds normal. No respiratory distress. She has no wheezes. She has no rales.  Abdominal: Soft. Bowel sounds are normal. She exhibits no distension and no mass. There is no tenderness.  Musculoskeletal: Normal range of motion. She exhibits no edema or tenderness.  Lymphadenopathy:  She has no cervical adenopathy.  Neurological: She is alert and oriented to person, place, and time. She has normal reflexes. No cranial nerve deficit. Coordination normal.  Skin: Skin is warm and dry. No rash noted. She is not diaphoretic.  Psychiatric: Mood, memory and affect normal.       Assessment & Plan  Hyperlipidemia, mixed Tolerating statin, encouraged heart healthy diet, avoid trans fats, minimize simple carbs and saturated fats. Increase exercise as tolerated  Essential hypertension Well controlled, no changes to meds. Encouraged heart healthy diet such as the DASH diet and exercise as tolerated.   Breast cancer screening Referred for [facility]  Decreased visual acuity Referred to opthamology for further evaluation  Hearing loss Referred to audiology for further consideration  Osteopenia With vit d def encouraged supplementation, stay active

## 2014-09-02 NOTE — Assessment & Plan Note (Signed)
Referred to opthamology for further evaluation 

## 2014-09-19 ENCOUNTER — Encounter: Payer: Self-pay | Admitting: Medical

## 2014-09-19 ENCOUNTER — Ambulatory Visit (INDEPENDENT_AMBULATORY_CARE_PROVIDER_SITE_OTHER): Payer: Medicare Other | Admitting: Medical

## 2014-09-19 VITALS — BP 130/72 | HR 71 | Temp 98.2°F | Ht 70.0 in | Wt 231.2 lb

## 2014-09-19 DIAGNOSIS — S46811A Strain of other muscles, fascia and tendons at shoulder and upper arm level, right arm, initial encounter: Secondary | ICD-10-CM

## 2014-09-19 MED ORDER — DICLOFENAC SODIUM 50 MG PO TBEC: 50.0000 mg | DELAYED_RELEASE_TABLET | Freq: Two times a day (BID) | ORAL | Status: DC

## 2014-09-19 MED ORDER — CYCLOBENZAPRINE HCL 5 MG PO TABS: 5.0000 mg | ORAL_TABLET | Freq: Every day | ORAL | Status: DC

## 2014-09-19 MED ORDER — KETOROLAC TROMETHAMINE 60 MG/2ML IM SOLN: 30.0000 mg | Freq: Once | INTRAMUSCULAR | Status: AC

## 2014-09-19 NOTE — Progress Notes (Signed)
Pre visit review using our clinic review tool, if applicable. No additional management support is needed unless otherwise documented below in the visit note. 

## 2014-09-19 NOTE — Progress Notes (Signed)
Subjective:    Patient ID: Terri Mckee, female    DOB: [birth year], 74 y.o.   MRN: [id number]  HPI  Pt in with pain in her rt trapezius. LPN put down back and shoulder. But on discussion and exam pain is in the trapezius. Pain just started on Monday. Since then pain is gradually getting worse. Pain when changes position. And pain on direct palpation over the area. No trauma or fall. No sob. No chest pain.   Pain in trap moderate to severe.    Review of Systems  Constitutional: Negative for fever, chills and fatigue.  Respiratory: Negative for chest tightness and shortness of breath.   Cardiovascular: Negative for chest pain and palpitations.  Gastrointestinal: Negative for abdominal pain.  Musculoskeletal:       Rt trapezius myalgia.   Neurological: Negative for dizziness and headaches.  Hematological: Negative for adenopathy. Does not bruise/bleed easily.     Past Medical History  Diagnosis Date  . Hypertension   . 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 History of cyst of breast   . 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 Colon polyps   . Elevated serum creatinine     with ACE inhibitor (Normal MRA of renal arteries)  . Arthritis     back  . GERD (gastroesophageal reflux disease)   . Cataract   . Hoarseness of voice 03/13/2012  . Cervical cancer screening 07/07/2012  . Skin lesion 07/10/2012  . Urinary incontinence 07/10/2012  . Acute upper respiratory infection 04/09/2014  . Osteopenia 08/17/2014  . Medicare annual wellness visit, subsequent 09/02/2014    Social History   Social History  . Marital Status: Married    Spouse Name: N/A  . Number of Children: N/A  . Years of Education: N/A   Occupational History  . Not on file.   Social History Main Topics  . Smoking status: Former Smoker    Quit date: 01/13/1991  . Smokeless tobacco: Never Used     Comment: quit 20 years ago 1/2 ppd x 40 years  .  Alcohol Use: No  . Drug Use: No  . Sexual Activity: No     Comment: lives with husband, no dietary restrictions,    Other Topics Concern  . Not on file   Social History Narrative   Former Smoker - quit 20 years ago (1/2 ppd x 40 years)   Widowed   1 Daughter         Past Surgical History  Procedure Laterality Date  . Ovary surgery      right ovary & tube replacement  . Wrist ganglion excision      rt  . Appendectomy    . Breast cyst excision      right breast, twice    Family History  Problem Relation Age of Onset  . Brain cancer Mother     deceased age 65  . Alcohol abuse Mother   . Other Brother     died of sepsis  . Gout Brother   . Colon cancer Neg Hx   . Alcohol abuse Father   . Cancer Sister     throat  . Pulmonary embolism Daughter   . Mental illness Daughter     anxiety  . Diabetes Maternal Aunt   . Diabetes Maternal Aunt   . Seizures Maternal Aunt   . Birth defects Maternal Aunt  Allergies  Allergen Reactions  . Fexofenadine Shortness Of Breath  . Erythromycin Nausea Only  . Oxycodone-Acetaminophen Nausea And Vomiting  . Propoxyphene N-Acetaminophen     REACTION: severe vomiting  . Simvastatin     REACTION: Myalgia    Current Outpatient Prescriptions on File Prior to Visit  Medication Sig Dispense Refill  . amLODipine (NORVASC) 10 MG tablet TAKE 1 TABLET (10 MG TOTAL) BY MOUTH DAILY. 90 tablet 2  . aspirin 81 MG tablet Take 81 mg by mouth daily.      . calcium carbonate (OS-CAL) 600 MG TABS Take 600 mg by mouth daily.      . diclofenac sodium (VOLTAREN) 1 % GEL Apply 1 application topically 3 (three) times daily. Apply  2 grams three times a day to affected area as needed 100 g 4  . ergocalciferol (VITAMIN D2) 50000 UNITS capsule Take 1 capsule (50,000 Units total) by mouth once a week. 4 capsule 4  . fluticasone (FLONASE) 50 MCG/ACT nasal spray Place 2 sprays into the nose daily. 16 g 2  . Garlic 161 MG CAPS Take 1 capsule by mouth daily.     Marland Kitchen levocetirizine (XYZAL) 5 MG tablet Take 1 tablet (5 mg total) by mouth every evening. 90 tablet 3  . Multiple Vitamin (MULTIVITAMIN) tablet Take 1 tablet by mouth daily.    . Omega-3 Fatty Acids (FISH OIL) 1200 MG CAPS Take 1,200 mg by mouth. 2 capsules by mouth once daily in the morning     . Omeprazole (CVS OMEPRAZOLE) 20 MG TBEC Take 1 tablet (20 mg total) by mouth daily. 1 capsule by mouth once daily 30 minutes before meals 30 each 5  . pravastatin (PRAVACHOL) 40 MG tablet TAKE 1 TABLET (40 MG TOTAL) BY MOUTH DAILY. 90 tablet 2  . ranitidine (ZANTAC) 150 MG capsule Take 1 capsule (150 mg total) by mouth at bedtime. 30 capsule 3  . SYMBICORT 80-4.5 MCG/ACT inhaler INHALE 2 PUFFS INTO THE LUNGS 2 (TWO) TIMES DAILY. 10.2 g 2  . traMADol (ULTRAM) 50 MG tablet Take 1 tablet (50 mg total) by mouth every 8 (eight) hours as needed. 40 tablet 0   No current facility-administered medications on file prior to visit.    BP 130/72 mmHg  Pulse 71  Temp(Src) 98.2 F (36.8 C) (Oral)  Ht 5\' 10"  (1.778 m)  Wt 231 lb 3.2 oz (104.872 kg)  BMI 33.17 kg/m2  SpO2 96%       Objective:   Physical Exam  General- No acute distress. Pleasant patient. Neck- Full range of motion, no jvd no pain on palpation or rom No cspine pain on palpation. Lungs- Clear, even and unlabored. Heart- regular rate and rhythm. Neurologic- CNII- XII grossly intact. Rt shoulder- no pain on palpation of shoulder. No pain in shoulder on rom. But does report pain in the trapezius area rt side.  Back- no tspine tender. Rt trapezius- on direct palpation of trapezius at level of upper tspine has sever pain on palpation. Some pain that runs down then medial portion of scapula.  Skin- no rash over the trapezius area.      Assessment & Plan:  Toradol 30 mg im today.  Rx diclofenac for pain and inflammation(can start tomorrow) flexril 5 mg tab to use at night  [employer]are heat patch to trapezius area. If pain persists  consider PT or sports medicine.  Follow up in 7 days or as needed

## 2014-09-19 NOTE — Addendum Note (Signed)
Addended by: Bunnie Domino on: 09/19/2014 03:52 PM   Modules accepted: Orders

## 2014-09-19 NOTE — Patient Instructions (Addendum)
Toradol 30 mg im today.  Rx diclofenac for pain and inflammation(can start tomorrow) flexril 5 mg tab to use at night  Thermacare heat patch to trapezius area. If pain persists consider PT or sports medicine.  Follow up in 7 days or as needed

## 2014-10-01 ENCOUNTER — Encounter: Payer: Self-pay | Admitting: Family Medicine

## 2014-10-02 ENCOUNTER — Other Ambulatory Visit: Payer: Self-pay | Admitting: Family Medicine

## 2014-10-11 ENCOUNTER — Ambulatory Visit
Admission: RE | Admit: 2014-10-11 | Discharge: 2014-10-11 | Disposition: A | Discharge status: Home / Self Care | Payer: Medicare Other | Source: Ambulatory Visit | Attending: Family Medicine | Admitting: Family Medicine

## 2014-10-11 DIAGNOSIS — Z1239 Encounter for other screening for malignant neoplasm of breast: Secondary | ICD-10-CM

## 2014-10-15 ENCOUNTER — Other Ambulatory Visit: Payer: Self-pay | Admitting: Family Medicine

## 2014-10-15 DIAGNOSIS — R928 Other abnormal and inconclusive findings on diagnostic imaging of breast: Secondary | ICD-10-CM

## 2014-10-25 ENCOUNTER — Ambulatory Visit
Admission: RE | Admit: 2014-10-25 | Discharge: 2014-10-25 | Disposition: A | Discharge status: Home / Self Care | Payer: Medicare Other | Source: Ambulatory Visit | Attending: Family Medicine | Admitting: Family Medicine

## 2014-10-25 DIAGNOSIS — R928 Other abnormal and inconclusive findings on diagnostic imaging of breast: Secondary | ICD-10-CM

## 2015-01-28 ENCOUNTER — Telehealth: Payer: Self-pay | Admitting: Family Medicine

## 2015-01-28 NOTE — Telephone Encounter (Signed)
Pt called stating sharp pain in shoulder going down her left arm since Friday. Transferred to [employer] with [facility].

## 2015-01-28 NOTE — Telephone Encounter (Signed)
Note sent to Vernie Shanks LPN and Dr Penni Homans.

## 2015-01-28 NOTE — Telephone Encounter (Signed)
She probably needs to be seen here or at an urgent care. Can try pain patches such as Salon Pas patches on shoulder to see if this helps

## 2015-01-28 NOTE — Telephone Encounter (Signed)
Patient Name: Terri Mckee  DOB: [birth year]    Initial Comment Caller states she is having pain on her right shoulder, going down her right arm. Pain is real sharp.    Nurse Assessment  Nurse: Orvan Seen, RN, [place] Date/Time (Eastern Time): 01/28/2015 9:41:29 AM  Confirm and document reason for call. If symptomatic, describe symptoms. You must click the next button to save text entered. ---Caller states she is having pain on her left shoulder, going down her left arm. Pain is real sharp. - Pain started Friday. No injury. Pain even when at rest. Denies any chest pain  Has the patient traveled out of the country within the last 30 days? ---No  Does the patient have any new or worsening symptoms? ---Yes  Will a triage be completed? ---Yes  Related visit to physician within the last 2 weeks? ---No  Does the PT have any chronic conditions? (i.e. diabetes, asthma, etc.) ---Yes  List chronic conditions. ---htn, gerd, arthritis, osteopenia  Is this a behavioral health or substance abuse call? ---No     Guidelines    Guideline Title Affirmed Question Affirmed Notes  Shoulder Pain [1] Age > 82 AND [2] no obvious cause AND [3] pain even when not moving the arm (Exception: pain is clearly made worse by moving arm or bending neck)    Final Disposition User   Go to ED Now Orvan Seen, RN, [facility] - ED

## 2015-01-28 NOTE — Telephone Encounter (Signed)
Called the patient informed of PCP instructions.  The patient stated it is not her right shoulder/arm that is hurting but her left. Transferred to a scheduler to get appointment scheduled.

## 2015-01-29 ENCOUNTER — Ambulatory Visit (INDEPENDENT_AMBULATORY_CARE_PROVIDER_SITE_OTHER): Payer: Medicare Other | Admitting: Physician Assistant

## 2015-01-29 ENCOUNTER — Ambulatory Visit (HOSPITAL_BASED_OUTPATIENT_CLINIC_OR_DEPARTMENT_OTHER)
Admission: RE | Admit: 2015-01-29 | Discharge: 2015-01-29 | Disposition: A | Discharge status: Home / Self Care | Payer: Medicare Other | Source: Ambulatory Visit | Attending: Physician Assistant | Admitting: Physician Assistant

## 2015-01-29 ENCOUNTER — Encounter: Payer: Self-pay | Admitting: Physician Assistant

## 2015-01-29 VITALS — BP 128/65 | HR 59 | Temp 97.7°F | Ht 70.0 in | Wt 222.8 lb

## 2015-01-29 DIAGNOSIS — M79602 Pain in left arm: Secondary | ICD-10-CM | POA: Diagnosis not present

## 2015-01-29 DIAGNOSIS — M25512 Pain in left shoulder: Secondary | ICD-10-CM

## 2015-01-29 LAB — TROPONIN I: TNIDX: 0.01 ug/l (ref 0.00–0.06)

## 2015-01-29 MED ORDER — TRAMADOL HCL 50 MG PO TABS: 50.0000 mg | ORAL_TABLET | Freq: Three times a day (TID) | ORAL | Status: DC | PRN

## 2015-01-29 MED ORDER — CYCLOBENZAPRINE HCL 5 MG PO TABS: 5.0000 mg | ORAL_TABLET | Freq: Every day | ORAL | Status: DC

## 2015-01-29 NOTE — Progress Notes (Signed)
Pre visit review using our clinic review tool, if applicable. No additional management support is needed unless otherwise documented below in the visit note. 

## 2015-01-29 NOTE — Assessment & Plan Note (Signed)
Vitals stable. Exacerbated by movement but not reproducible with exam. EKG obtained due to age and risk factors -- revealing sinus bradycardia. Feel symptoms are due to muscular inflammation and nerve irritation. Rx Tramadol and flexeril. Supportive measures reviewed. Will obtain X-ray shoulder today. Troponin will be checked today STAT due to pain of left arm although there is very low suspicion of ACS, just to make sure levels are normal.

## 2015-01-29 NOTE — Patient Instructions (Signed)
Please go to the lab for blood work. Ten go downstairs for imaging. I will call with your results.  Take the Tramadol and the Flexeril as directed. No heavy lifting. Apply topical Aspercreme to the shoulder and arm. Follow-up will be based on results but we will plan to follow-up in Friday.

## 2015-01-29 NOTE — Progress Notes (Signed)
Patient presents to clinic today c/o 5 days of L shoulder pain ranked 7/10 with radiation into shoulder blade and into arm. Pain is constant and aggravated by movement. Denies trauma or injury. Denies numbness or tingling. Denies chest pain, palpitations or SOB. Has not taken anything for symptoms. Also endorses mild bilateral low back pain since this morning after bending over to pick something up. Denies radiation of pain.   Past Medical History  Diagnosis Date  . Hypertension   . 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 History of cyst of breast   . 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 Colon polyps   . Elevated serum creatinine     with ACE inhibitor (Normal MRA of renal arteries)  . Arthritis     back  . GERD (gastroesophageal reflux disease)   . Cataract   . Hoarseness of voice 03/13/2012  . Cervical cancer screening 07/07/2012  . Skin lesion 07/10/2012  . Urinary incontinence 07/10/2012  . Acute upper respiratory infection 04/09/2014  . Osteopenia 08/17/2014  . Medicare annual wellness visit, subsequent 09/02/2014    Current Outpatient Prescriptions on File Prior to Visit  Medication Sig Dispense Refill  . amLODipine (NORVASC) 10 MG tablet TAKE 1 TABLET (10 MG TOTAL) BY MOUTH DAILY. 90 tablet 2  . aspirin 81 MG tablet Take 81 mg by mouth daily.      . calcium carbonate (OS-CAL) 600 MG TABS Take 600 mg by mouth daily.      . diclofenac sodium (VOLTAREN) 1 % GEL Apply 1 application topically 3 (three) times daily. Apply  2 grams three times a day to affected area as needed 100 g 4  . ergocalciferol (VITAMIN D2) 50000 UNITS capsule Take 1 capsule (50,000 Units total) by mouth once a week. 4 capsule 4  . fluticasone (FLONASE) 50 MCG/ACT nasal spray Place 2 sprays into the nose daily. 16 g 2  . Garlic [id number] MG CAPS Take 1 capsule by mouth daily.    Marland Kitchen levocetirizine (XYZAL) 5 MG tablet Take 1 tablet (5 mg total) by mouth every  evening. 90 tablet 3  . Multiple Vitamin (MULTIVITAMIN) tablet Take 1 tablet by mouth daily.    . Omega-3 Fatty Acids (FISH OIL) 1200 MG CAPS Take 1,200 mg by mouth. 2 capsules by mouth once daily in the morning     . omeprazole (PRILOSEC) 20 MG capsule TAKE 1 TABLET (20 MG TOTAL) BY MOUTH DAILY. 1 CAPSULE BY MOUTH ONCE DAILY 30 MINUTES BEFORE MEALS 30 capsule 5  . pravastatin (PRAVACHOL) 40 MG tablet TAKE 1 TABLET (40 MG TOTAL) BY MOUTH DAILY. 90 tablet 2  . ranitidine (ZANTAC) 150 MG capsule Take 1 capsule (150 mg total) by mouth at bedtime. 30 capsule 3  . SYMBICORT 80-4.5 MCG/ACT inhaler INHALE 2 PUFFS INTO THE LUNGS 2 (TWO) TIMES DAILY. 10.2 g 2   No current facility-administered medications on file prior to visit.    Allergies  Allergen Reactions  . Fexofenadine Shortness Of Breath  . Erythromycin Nausea Only  . Oxycodone-Acetaminophen Nausea And Vomiting  . Propoxyphene N-Acetaminophen     REACTION: severe vomiting  . Simvastatin     REACTION: Myalgia    Family History  Problem Relation Age of Onset  . Brain cancer Mother     deceased age 58  . Alcohol abuse Mother   . Other Brother  died of sepsis  . Gout Brother   . Colon cancer Neg Hx   . Alcohol abuse Father   . Cancer Sister     throat  . Pulmonary embolism Daughter   . Mental illness Daughter     anxiety  . Diabetes Maternal Aunt   . Diabetes Maternal Aunt   . Seizures Maternal Aunt   . Birth defects Maternal Aunt     Social History   Social History  . Marital Status: Married    Spouse Name: N/A  . Number of Children: N/A  . Years of Education: N/A   Social History Main Topics  . Smoking status: Former Smoker    Quit date: 01/13/1991  . Smokeless tobacco: Never Used     Comment: quit 20 years ago 1/2 ppd x 40 years  . Alcohol Use: No  . Drug Use: No  . Sexual Activity: No     Comment: lives with husband, no dietary restrictions,    Other Topics Concern  . None   Social History Narrative     Former Smoker - quit 20 years ago (1/2 ppd x 40 years)   Widowed   1 Daughter        Review of Systems - See HPI.  All other ROS are negative.  BP 128/65 mmHg  Pulse 59  Temp(Src) 97.7 F (36.5 C) (Oral)  Ht 5\' 10"  (1.778 m)  Wt 222 lb 12.8 oz (101.061 kg)  BMI 31.97 kg/m2  SpO2 99%  Physical Exam  Constitutional: She is oriented to person, place, and time and well-developed, well-nourished, and in no distress.  HENT:  Head: Normocephalic and atraumatic.  Eyes: Conjunctivae are normal. Pupils are equal, round, and reactive to light.  Neck: Neck supple.  Cardiovascular: Normal rate, regular rhythm, normal heart sounds and intact distal pulses.   Pulmonary/Chest: Effort normal and breath sounds normal. No respiratory distress. She has no wheezes. She has no rales. She exhibits no tenderness.  Musculoskeletal:       Left shoulder: She exhibits pain and spasm. She exhibits normal range of motion, no bony tenderness, no effusion, no crepitus, no deformity, normal pulse and normal strength.       Cervical back: Normal.  Neurological: She is alert and oriented to person, place, and time.  Skin: Skin is warm and dry. No rash noted.  Psychiatric: Affect normal.  Vitals reviewed.   No results found for this or any previous visit (from the past 2160 hour(s)).  Assessment/Plan: Left shoulder pain Vitals stable. Exacerbated by movement but not reproducible with exam. EKG obtained due to age and risk factors -- revealing sinus bradycardia. Feel symptoms are due to muscular inflammation and nerve irritation. Rx Tramadol and flexeril. Supportive measures reviewed. Will obtain X-ray shoulder today. Troponin will be checked today STAT due to pain of left arm although there is very low suspicion of ACS, just to make sure levels are normal.

## 2015-02-01 ENCOUNTER — Encounter: Payer: Self-pay | Admitting: Physician Assistant

## 2015-02-01 ENCOUNTER — Ambulatory Visit (INDEPENDENT_AMBULATORY_CARE_PROVIDER_SITE_OTHER): Payer: Medicare Other | Admitting: Physician Assistant

## 2015-02-01 VITALS — BP 120/68 | HR 67 | Temp 98.0°F | Ht 70.0 in | Wt 224.6 lb

## 2015-02-01 DIAGNOSIS — M25512 Pain in left shoulder: Secondary | ICD-10-CM

## 2015-02-01 MED ORDER — ACETAMINOPHEN 500 MG PO TABS: 500.0000 mg | ORAL_TABLET | Freq: Three times a day (TID) | ORAL | Status: DC | PRN

## 2015-02-01 NOTE — Assessment & Plan Note (Signed)
Resolving. Pain now infrequent and 1/10. Will continue supportive measures. Tylenol for residual pain. Follow-up if symptoms recur.

## 2015-02-01 NOTE — Progress Notes (Signed)
Patient presents to clinic today for follow-up of shoulder pain. Has been taking medications as directed. Transitioned to tylenol from Tramadol as pain is much improved. Endorses pain is very mild and infrequent. Denies new or worsening symptoms.  Past Medical History  Diagnosis Date  . Hypertension   . 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 History of cyst of breast   . 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 Colon polyps   . Elevated serum creatinine     with ACE inhibitor (Normal MRA of renal arteries)  . Arthritis     back  . GERD (gastroesophageal reflux disease)   . Cataract   . Hoarseness of voice 03/13/2012  . Cervical cancer screening 07/07/2012  . Skin lesion 07/10/2012  . Urinary incontinence 07/10/2012  . Acute upper respiratory infection 04/09/2014  . Osteopenia 08/17/2014  . Medicare annual wellness visit, subsequent 09/02/2014    Current Outpatient Prescriptions on File Prior to Visit  Medication Sig Dispense Refill  . amLODipine (NORVASC) 10 MG tablet TAKE 1 TABLET (10 MG TOTAL) BY MOUTH DAILY. 90 tablet 2  . aspirin 81 MG tablet Take 81 mg by mouth daily.      . calcium carbonate (OS-CAL) 600 MG TABS Take 600 mg by mouth daily.      . cyclobenzaprine (FLEXERIL) 5 MG tablet Take 1 tablet (5 mg total) by mouth at bedtime. 7 tablet 0  . diclofenac sodium (VOLTAREN) 1 % GEL Apply 1 application topically 3 (three) times daily. Apply  2 grams three times a day to affected area as needed 100 g 4  . ergocalciferol (VITAMIN D2) 50000 UNITS capsule Take 1 capsule (50,000 Units total) by mouth once a week. 4 capsule 4  . fluticasone (FLONASE) 50 MCG/ACT nasal spray Place 2 sprays into the nose daily. 16 g 2  . Garlic [id number] MG CAPS Take 1 capsule by mouth daily.    Marland Kitchen levocetirizine (XYZAL) 5 MG tablet Take 1 tablet (5 mg total) by mouth every evening. 90 tablet 3  . Multiple Vitamin (MULTIVITAMIN) tablet Take 1 tablet by  mouth daily.    . Omega-3 Fatty Acids (FISH OIL) 1200 MG CAPS Take 1,200 mg by mouth. 2 capsules by mouth once daily in the morning     . omeprazole (PRILOSEC) 20 MG capsule TAKE 1 TABLET (20 MG TOTAL) BY MOUTH DAILY. 1 CAPSULE BY MOUTH ONCE DAILY 30 MINUTES BEFORE MEALS 30 capsule 5  . pravastatin (PRAVACHOL) 40 MG tablet TAKE 1 TABLET (40 MG TOTAL) BY MOUTH DAILY. 90 tablet 2  . ranitidine (ZANTAC) 150 MG capsule Take 1 capsule (150 mg total) by mouth at bedtime. 30 capsule 3  . SYMBICORT 80-4.5 MCG/ACT inhaler INHALE 2 PUFFS INTO THE LUNGS 2 (TWO) TIMES DAILY. 10.2 g 2   No current facility-administered medications on file prior to visit.    Allergies  Allergen Reactions  . Fexofenadine Shortness Of Breath  . Erythromycin Nausea Only  . Oxycodone-Acetaminophen Nausea And Vomiting  . Propoxyphene N-Acetaminophen     REACTION: severe vomiting  . Simvastatin     REACTION: Myalgia    Family History  Problem Relation Age of Onset  . Brain cancer Mother     deceased age 44  . Alcohol abuse Mother   . Other Brother     died of sepsis  . Gout Brother   . Colon cancer  Neg Hx   . Alcohol abuse Father   . Cancer Sister     throat  . Pulmonary embolism Daughter   . Mental illness Daughter     anxiety  . Diabetes Maternal Aunt   . Diabetes Maternal Aunt   . Seizures Maternal Aunt   . Birth defects Maternal Aunt     Social History   Social History  . Marital Status: Married    Spouse Name: N/A  . Number of Children: N/A  . Years of Education: N/A   Social History Main Topics  . Smoking status: Former Smoker    Quit date: 01/13/1991  . Smokeless tobacco: Never Used     Comment: quit 20 years ago 1/2 ppd x 40 years  . Alcohol Use: No  . Drug Use: No  . Sexual Activity: No     Comment: lives with husband, no dietary restrictions,    Other Topics Concern  . None   Social History Narrative   Former Smoker - quit 20 years ago (1/2 ppd x 40 years)   Widowed   1  Daughter        Review of Systems - See HPI.  All other ROS are negative.  BP 120/68 mmHg  Pulse 67  Temp(Src) 98 F (36.7 C) (Oral)  Ht 5\' 10"  (1.778 m)  Wt 224 lb 9.6 oz (101.878 kg)  BMI 32.23 kg/m2  SpO2 98%  Physical Exam  Constitutional: She is oriented to person, place, and time and well-developed, well-nourished, and in no distress.  HENT:  Head: Normocephalic.  Eyes: Conjunctivae are normal.  Neck: Neck supple.  Cardiovascular: Normal rate, regular rhythm, normal heart sounds and intact distal pulses.   Pulmonary/Chest: Effort normal.  Musculoskeletal:       Left shoulder: She exhibits normal range of motion, no tenderness, no bony tenderness and no pain.  Neurological: She is alert and oriented to person, place, and time.  Skin: Skin is warm and dry. No rash noted.  Psychiatric: Affect normal.  Vitals reviewed.   Recent Results (from the past 2160 hour(s))  Troponin I     Status: None   Collection Time: 01/29/15 10:05 AM  Result Value Ref Range   TNIDX 0.01 0.00 - 0.06 ug/l    Assessment/Plan: Left shoulder pain Resolving. Pain now infrequent and 1/10. Will continue supportive measures. Tylenol for residual pain. Follow-up if symptoms recur.

## 2015-02-01 NOTE — Progress Notes (Signed)
Pre visit review using our clinic review tool, if applicable. No additional management support is needed unless otherwise documented below in the visit note. 

## 2015-02-01 NOTE — Patient Instructions (Signed)
Please continue the muscle relaxer as directed in the evening over the next few days. Continue the supportive measures discussed at last visit. I have sent in a prescription for tylenol to use as directed when needed. Follow-up if symptoms do not continue to resolve.

## 2015-02-21 ENCOUNTER — Other Ambulatory Visit: Payer: Self-pay | Admitting: Family Medicine

## 2015-02-21 MED ORDER — LEVOCETIRIZINE DIHYDROCHLORIDE 5 MG PO TABS: 5.0000 mg | ORAL_TABLET | Freq: Every evening | ORAL | Status: DC

## 2015-03-05 ENCOUNTER — Other Ambulatory Visit: Payer: Self-pay | Admitting: Family Medicine

## 2015-03-05 MED ORDER — RANITIDINE HCL 150 MG PO CAPS: 150.0000 mg | ORAL_CAPSULE | Freq: Every day | ORAL | Status: DC

## 2015-05-07 ENCOUNTER — Emergency Department (HOSPITAL_COMMUNITY)
Admission: EM | Admit: 2015-05-07 | Discharge: 2015-05-07 | Disposition: A | Discharge status: Home / Self Care | Payer: Medicare Other | Attending: Emergency Medicine | Admitting: Emergency Medicine

## 2015-05-07 ENCOUNTER — Emergency Department (HOSPITAL_COMMUNITY): Payer: Medicare Other

## 2015-05-07 ENCOUNTER — Emergency Department (HOSPITAL_BASED_OUTPATIENT_CLINIC_OR_DEPARTMENT_OTHER): Admit: 2015-05-07 | Discharge: 2015-05-07 | Disposition: A | Discharge status: Home / Self Care | Payer: Medicare Other

## 2015-05-07 ENCOUNTER — Encounter (HOSPITAL_COMMUNITY): Payer: Self-pay | Admitting: Family Medicine

## 2015-05-07 DIAGNOSIS — K219 Gastro-esophageal reflux disease without esophagitis: Secondary | ICD-10-CM | POA: Diagnosis not present

## 2015-05-07 DIAGNOSIS — Z8669 Personal history of other diseases of the nervous system and sense organs: Secondary | ICD-10-CM | POA: Diagnosis not present

## 2015-05-07 DIAGNOSIS — Z7982 Long term (current) use of aspirin: Secondary | ICD-10-CM | POA: Insufficient documentation

## 2015-05-07 DIAGNOSIS — Z8709 Personal history of other diseases of the respiratory system: Secondary | ICD-10-CM | POA: Diagnosis not present

## 2015-05-07 DIAGNOSIS — M79609 Pain in unspecified limb: Secondary | ICD-10-CM | POA: Diagnosis not present

## 2015-05-07 DIAGNOSIS — Z87891 Personal history of nicotine dependence: Secondary | ICD-10-CM | POA: Diagnosis not present

## 2015-05-07 DIAGNOSIS — Z872 Personal history of diseases of the skin and subcutaneous tissue: Secondary | ICD-10-CM | POA: Diagnosis not present

## 2015-05-07 DIAGNOSIS — M79605 Pain in left leg: Secondary | ICD-10-CM | POA: Diagnosis present

## 2015-05-07 DIAGNOSIS — Z7951 Long term (current) use of inhaled steroids: Secondary | ICD-10-CM | POA: Insufficient documentation

## 2015-05-07 DIAGNOSIS — Z86010 Personal history of colonic polyps: Secondary | ICD-10-CM | POA: Diagnosis not present

## 2015-05-07 DIAGNOSIS — M1990 Unspecified osteoarthritis, unspecified site: Secondary | ICD-10-CM | POA: Insufficient documentation

## 2015-05-07 DIAGNOSIS — Z79899 Other long term (current) drug therapy: Secondary | ICD-10-CM | POA: Insufficient documentation

## 2015-05-07 DIAGNOSIS — Z87448 Personal history of other diseases of urinary system: Secondary | ICD-10-CM | POA: Diagnosis not present

## 2015-05-07 DIAGNOSIS — I10 Essential (primary) hypertension: Secondary | ICD-10-CM | POA: Insufficient documentation

## 2015-05-07 LAB — CBC WITH DIFFERENTIAL/PLATELET
Basophils Absolute: 0 10*3/uL (ref 0.0–0.1)
Basophils Relative: 0 %
Eosinophils Absolute: 0.3 10*3/uL (ref 0.0–0.7)
Eosinophils Relative: 4 %
HCT: 38.4 % (ref 36.0–46.0)
Hemoglobin: 12.6 g/dL (ref 12.0–15.0)
LYMPHS ABS: 2.6 10*3/uL (ref 0.7–4.0)
LYMPHS PCT: 40 %
MCH: 28.3 pg (ref 26.0–34.0)
MCHC: 32.8 g/dL (ref 30.0–36.0)
MCV: 86.3 fL (ref 78.0–100.0)
MONO ABS: 0.5 10*3/uL (ref 0.1–1.0)
Monocytes Relative: 8 %
Neutro Abs: 3.1 10*3/uL (ref 1.7–7.7)
Neutrophils Relative %: 48 %
PLATELETS: 229 10*3/uL (ref 150–400)
RBC: 4.45 MIL/uL (ref 3.87–5.11)
RDW: 15 % (ref 11.5–15.5)
WBC: 6.5 10*3/uL (ref 4.0–10.5)

## 2015-05-07 LAB — BASIC METABOLIC PANEL
Anion gap: 11 (ref 5–15)
BUN: 15 mg/dL (ref 6–20)
CHLORIDE: 108 mmol/L (ref 101–111)
CO2: 26 mmol/L (ref 22–32)
CREATININE: 1.31 mg/dL — AB (ref 0.44–1.00)
Calcium: 9.1 mg/dL (ref 8.9–10.3)
GFR calc Af Amer: 45 mL/min — ABNORMAL LOW (ref >60)
GFR calc non Af Amer: 39 mL/min — ABNORMAL LOW (ref >60)
Glucose, Bld: 92 mg/dL (ref 65–99)
Potassium: 3.5 mmol/L (ref 3.5–5.1)
Sodium: 145 mmol/L (ref 135–145)

## 2015-05-07 LAB — MAGNESIUM: Magnesium: 2.1 mg/dL (ref 1.7–2.4)

## 2015-05-07 LAB — CK: Total CK: 59 U/L (ref 38–234)

## 2015-05-07 MED ORDER — ACETAMINOPHEN-CODEINE #3 300-30 MG PO TABS
1.0000 | ORAL_TABLET | Freq: Once | ORAL | Status: AC
  Administered 2015-05-07: 1 via ORAL
  Filled 2015-05-07: qty 1

## 2015-05-07 MED ORDER — CYCLOBENZAPRINE HCL 10 MG PO TABS: 10.0000 mg | ORAL_TABLET | Freq: Two times a day (BID) | ORAL | Status: DC | PRN

## 2015-05-07 MED ORDER — ACETAMINOPHEN-CODEINE #3 300-30 MG PO TABS
1.0000 | ORAL_TABLET | Freq: Four times a day (QID) | ORAL | Status: DC | PRN
Start: 2015-05-07 — End: 2016-04-03

## 2015-05-07 NOTE — ED Notes (Signed)
Pt verbalized understanding of d/c instructions and has no further questions. Pt stable and NAD. Pt d/c home with husband driving.  

## 2015-05-07 NOTE — Progress Notes (Signed)
VASCULAR LAB PRELIMINARY  PRELIMINARY  PRELIMINARY  PRELIMINARY  Left lower extremity venous duplex completed.    Left:  No evidence of DVT, superficial thrombosis, or Baker's cyst.   Janifer Adie, RVT, RDMS 05/07/2015, 6:47 PM

## 2015-05-07 NOTE — ED Notes (Signed)
Pt being transported to x-ray

## 2015-05-07 NOTE — ED Notes (Signed)
Pt here for left leg pain and and muscle spasms and weakness. sts intermittent episodes and the pain shoots down her left leg from her hip. sts that it grabs a hold of her, her leg gives out and she cant walk.

## 2015-05-07 NOTE — ED Provider Notes (Signed)
CSN: [id number]     Arrival date & time 05/07/15  1252 History   First MD Initiated Contact with Patient 05/07/15 1724     Chief Complaint  Patient presents with  . Leg Problem     (Consider location/radiation/quality/duration/timing/severity/associated sxs/prior Treatment) HPI  75 year old female presents with acute leg pain. Patient states this started yesterday but was really present today while at the bank. She was feeling fine and walking normally. She was about to leave the back when she had a sudden onset severe pain and spasm in left thigh. The leg felt weak (although a lot of that was because of pain). Leg seemed to give out on her. Unable to walk on it. When at rest pain is much better and she can move left. No recent illness, vomiting, or diarrhea. No dyspnea/chest pain. No leg swelling. Has not taken anything for the pain. Pain shoots from thigh down to lower leg. No back pain.  Past Medical History  Diagnosis Date  . Hypertension   . 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 History of cyst of breast   . 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 Colon polyps   . Elevated serum creatinine     with ACE inhibitor (Normal MRA of renal arteries)  . Arthritis     back  . GERD (gastroesophageal reflux disease)   . Cataract   . Hoarseness of voice 03/13/2012  . Cervical cancer screening 07/07/2012  . Skin lesion 07/10/2012  . Urinary incontinence 07/10/2012  . Acute upper respiratory infection 04/09/2014  . Osteopenia 08/17/2014  . Medicare annual wellness visit, subsequent 09/02/2014   Past Surgical History  Procedure Laterality Date  . Ovary surgery      right ovary & tube replacement  . Wrist ganglion excision      rt  . Appendectomy    . Breast cyst excision      right breast, twice   Family History  Problem Relation Age of Onset  . Brain cancer Mother     deceased age 68  . Alcohol abuse Mother   . Other  Brother     died of sepsis  . Gout Brother   . Colon cancer Neg Hx   . Alcohol abuse Father   . Cancer Sister     throat  . Pulmonary embolism Daughter   . Mental illness Daughter     anxiety  . Diabetes Maternal Aunt   . Diabetes Maternal Aunt   . Seizures Maternal Aunt   . Birth defects Maternal Aunt    Social History  Substance Use Topics  . Smoking status: Former Smoker    Quit date: 01/13/1991  . Smokeless tobacco: Never Used     Comment: quit 20 years ago 1/2 ppd x 40 years  . Alcohol Use: No   OB History    No data available     Review of Systems  Constitutional: Negative for fever.  Respiratory: Negative for shortness of breath.   Cardiovascular: Negative for chest pain and leg swelling.  Musculoskeletal: Positive for myalgias. Negative for joint swelling.  Neurological: Positive for weakness. Negative for numbness.  All other systems reviewed and are negative.     Allergies  Fexofenadine; Erythromycin; Oxycodone-acetaminophen; Propoxyphene n-acetaminophen; and Simvastatin  Home Medications   Prior to Admission medications   Medication Sig Start Date End Date Taking? Authorizing Provider  acetaminophen (TYLENOL)  500 MG tablet Take 1 tablet (500 mg total) by mouth every 8 (eight) hours as needed. 02/01/15   Brunetta Jeans, PA-C  amLODipine (NORVASC) 10 MG tablet TAKE 1 TABLET (10 MG TOTAL) BY MOUTH DAILY. 08/17/14   [name], MD  aspirin 81 MG tablet Take 81 mg by mouth daily.      Historical Provider, MD  calcium carbonate (OS-CAL) 600 MG TABS Take 600 mg by mouth daily.      Historical Provider, MD  cyclobenzaprine (FLEXERIL) 5 MG tablet Take 1 tablet (5 mg total) by mouth at bedtime. 01/29/15   Brunetta Jeans, PA-C  diclofenac sodium (VOLTAREN) 1 % GEL Apply 1 application topically 3 (three) times daily. Apply  2 grams three times a day to affected area as needed 08/17/14   [name], MD  ergocalciferol (VITAMIN D2) 50000 UNITS capsule Take 1  capsule (50,000 Units total) by mouth once a week. 08/20/14   [name], MD  fluticasone (FLONASE) 50 MCG/ACT nasal spray Place 2 sprays into the nose daily. 04/01/12   Debbrah Alar, NP  Garlic [id number] MG CAPS Take 1 capsule by mouth daily.    Historical Provider, MD  levocetirizine (XYZAL) 5 MG tablet Take 1 tablet (5 mg total) by mouth every evening. 02/21/15   [name], MD  Multiple Vitamin (MULTIVITAMIN) tablet Take 1 tablet by mouth daily.    Historical Provider, MD  Omega-3 Fatty Acids (FISH OIL) 1200 MG CAPS Take 1,200 mg by mouth. 2 capsules by mouth once daily in the morning     Historical Provider, MD  omeprazole (PRILOSEC) 20 MG capsule TAKE 1 TABLET (20 MG TOTAL) BY MOUTH DAILY. 1 CAPSULE BY MOUTH ONCE DAILY 30 MINUTES BEFORE MEALS 10/02/14   [name], MD  pravastatin (PRAVACHOL) 40 MG tablet TAKE 1 TABLET (40 MG TOTAL) BY MOUTH DAILY. 06/29/14   [name], MD  ranitidine (ZANTAC) 150 MG capsule Take 1 capsule (150 mg total) by mouth at bedtime. 03/05/15   [name], MD  SYMBICORT 80-4.5 MCG/ACT inhaler INHALE 2 PUFFS INTO THE LUNGS 2 (TWO) TIMES DAILY. 02/16/13   [name], MD   BP 140/78 mmHg  Pulse 66  Temp(Src) 97.8 F (36.6 C) (Oral)  Resp 18  SpO2 98% Physical Exam  Constitutional: She is oriented to person, place, and time. She appears well-developed and well-nourished.  HENT:  Head: Normocephalic and atraumatic.  Right Ear: External ear normal.  Left Ear: External ear normal.  Nose: Nose normal.  Eyes: Right eye exhibits no discharge. Left eye exhibits no discharge.  Cardiovascular: Normal rate, regular rhythm and normal heart sounds.   Pulses:      Dorsalis pedis pulses are 2+ on the right side, and 2+ on the left side.  Pulmonary/Chest: Effort normal.  Abdominal: Soft. She exhibits no distension.  Musculoskeletal:       Left hip: She exhibits normal range of motion and no tenderness.       Left knee: She exhibits normal range of motion. No  tenderness found.       Right upper leg: She exhibits tenderness.       Right lower leg: She exhibits tenderness (posteriorly). She exhibits no bony tenderness and no swelling.  Normal ROM of hip and knee. Pain is greatest when asked to straight leg raise, pain is mostly thigh at that time.  Neurological: She is alert and oriented to person, place, and time.  Reflex  Scores:      Achilles reflexes are 2+ on the right side and 2+ on the left side. Equal strength in BLE. Somewhat limited in left due to pain but strength in 5/5. Normal sensation. Difficult to get patellar reflexes bilaterally due to size.  Skin: Skin is warm and dry.  Nursing note and vitals reviewed.   ED Course  Procedures (including critical care time) Labs Review Labs Reviewed  BASIC METABOLIC PANEL - Abnormal; Notable for the following:    Creatinine, Ser 1.31 (*)    GFR calc non Af Amer 39 (*)    GFR calc Af Amer 45 (*)    All other components within normal limits  CBC WITH DIFFERENTIAL/PLATELET  CK  MAGNESIUM    Imaging Review Dg Femur Min 2 Views Left  05/07/2015  CLINICAL DATA:  Acute onset of left leg pain and spasms. Generalized weakness. Initial encounter. EXAM: LEFT FEMUR 2 VIEWS COMPARISON:  None. FINDINGS: There is no evidence of fracture or dislocation. The left femur appears intact. Slight axial joint space narrowing is noted at the left hip. The knee joint is grossly unremarkable. No knee joint effusion is identified. The visualized soft tissues are grossly unremarkable. IMPRESSION: No evidence of fracture or dislocation. Electronically Signed   By: Garald Balding M.D.   On: 05/07/2015 19:46    Expand All Collapse All   VASCULAR LAB PRELIMINARY PRELIMINARY PRELIMINARY PRELIMINARY  Left lower extremity venous duplex completed.  Left: No evidence of DVT, superficial thrombosis, or Baker's cyst.   Janifer Adie, RVT, RDMS 05/07/2015, 6:47 PM         I have personally reviewed and evaluated  these images and lab results as part of my medical decision-making.   EKG Interpretation None      MDM   Final diagnoses:  Left leg pain    No clear source for the patient's sudden onset left leg pain. She has normal pulses and no signs of acute ischemia. DVT ultrasound is negative. Pain seems to primarily come from her anterior thigh. Possibly from spasm or muscle strain. There was no direct injury or obvious cause. She feels better after Tylenol 3, which she states she can tolerate despite her codeine allergy. After this I was able to get her up and have her ambulate which she has a mild limp but otherwise can walk. Plan to treat her with pain medicine and muscle relaxers. Follow-up with PCP.    Sherwood Gambler, MD 05/08/15 [phone]

## 2015-05-10 ENCOUNTER — Encounter: Payer: Self-pay | Admitting: Family Medicine

## 2015-05-10 ENCOUNTER — Ambulatory Visit (INDEPENDENT_AMBULATORY_CARE_PROVIDER_SITE_OTHER): Payer: Medicare Other | Admitting: Family Medicine

## 2015-05-10 VITALS — BP 120/72 | HR 81 | Temp 98.3°F | Ht 70.0 in | Wt 227.1 lb

## 2015-05-10 DIAGNOSIS — E782 Mixed hyperlipidemia: Secondary | ICD-10-CM

## 2015-05-10 DIAGNOSIS — R5382 Chronic fatigue, unspecified: Secondary | ICD-10-CM | POA: Diagnosis not present

## 2015-05-10 DIAGNOSIS — M791 Myalgia: Secondary | ICD-10-CM

## 2015-05-10 DIAGNOSIS — I10 Essential (primary) hypertension: Secondary | ICD-10-CM

## 2015-05-10 DIAGNOSIS — M8580 Other specified disorders of bone density and structure, unspecified site: Secondary | ICD-10-CM

## 2015-05-10 DIAGNOSIS — M6281 Muscle weakness (generalized): Secondary | ICD-10-CM | POA: Diagnosis not present

## 2015-05-10 DIAGNOSIS — M7918 Myalgia, other site: Secondary | ICD-10-CM

## 2015-05-10 MED ORDER — CYCLOBENZAPRINE HCL 10 MG PO TABS: 10.0000 mg | ORAL_TABLET | Freq: Two times a day (BID) | ORAL | Status: DC | PRN

## 2015-05-10 MED ORDER — FLUTICASONE PROPIONATE 50 MCG/ACT NA SUSP: 2.0000 | Freq: Every day | NASAL | Status: DC

## 2015-05-10 MED ORDER — ERGOCALCIFEROL 1.25 MG (50000 UT) PO CAPS: 50000.0000 [IU] | ORAL_CAPSULE | ORAL | Status: DC

## 2015-05-10 NOTE — Progress Notes (Signed)
Subjective:    Patient ID: Terri Mckee, female    DOB: 03-24-1940, 75 y.o.   MRN: [id number]  Chief Complaint  Patient presents with  . Follow-up    HPI Patient is in today for follow up.  Patient recently hospitalized for left leg pain and could not move leg.  Patient was diagnosed with muscle spasm, leg still has been sore but has improved with medication treatment. Patient reports some concerns with seasonal allergies. Denies CP/palp/SOB/HA/congestion/fevers/GI or GU c/o. Taking meds as prescribed. No other acute complaints or recent illness.  Past Medical History  Diagnosis Date  . Hypertension   . 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 History of cyst of breast   . 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 Colon polyps   . Elevated serum creatinine     with ACE inhibitor (Normal MRA of renal arteries)  . Arthritis     back  . GERD (gastroesophageal reflux disease)   . Cataract   . Hoarseness of voice 03/13/2012  . Cervical cancer screening 07/07/2012  . Skin lesion 07/10/2012  . Urinary incontinence 07/10/2012  . Acute upper respiratory infection 04/09/2014  . Osteopenia 08/17/2014  . Medicare annual wellness visit, subsequent 09/02/2014  . Muscle spasm of left lower extremity 03/21/2007    Qualifier: Diagnosis of  By: Wynona Luna     Past Surgical History  Procedure Laterality Date  . Ovary surgery      right ovary & tube replacement  . Wrist ganglion excision      rt  . Appendectomy    . Breast cyst excision      right breast, twice    Family History  Problem Relation Age of Onset  . Brain cancer Mother     deceased age 86  . Alcohol abuse Mother   . Other Brother     died of sepsis  . Gout Brother   . Colon cancer Neg Hx   . Alcohol abuse Father   . Cancer Sister     throat  . Pulmonary embolism Daughter   . Mental illness Daughter     anxiety  . Diabetes Maternal Aunt   . Diabetes  Maternal Aunt   . Seizures Maternal Aunt   . Birth defects Maternal Aunt     Social History   Social History  . Marital Status: Married    Spouse Name: N/A  . Number of Children: N/A  . Years of Education: N/A   Occupational History  . Not on file.   Social History Main Topics  . Smoking status: Former Smoker    Quit date: 01/13/1991  . Smokeless tobacco: Never Used     Comment: quit 20 years ago 1/2 ppd x 40 years  . Alcohol Use: No  . Drug Use: No  . Sexual Activity: No     Comment: lives with husband, no dietary restrictions,    Other Topics Concern  . Not on file   Social History Narrative   Former Smoker - quit 20 years ago (1/2 ppd x 40 years)   Widowed   1 Daughter         Outpatient Prescriptions Prior to Visit  Medication Sig Dispense Refill  . acetaminophen (TYLENOL) 500 MG tablet Take 1 tablet (500 mg total) by mouth every 8 (eight) hours as needed. 90 tablet 0  . acetaminophen-codeine (  TYLENOL #3) 300-30 MG tablet Take 1-2 tablets by mouth every 6 (six) hours as needed for severe pain. 15 tablet 0  . amLODipine (NORVASC) 10 MG tablet TAKE 1 TABLET (10 MG TOTAL) BY MOUTH DAILY. 90 tablet 2  . aspirin 81 MG tablet Take 81 mg by mouth daily.      . calcium carbonate (OS-CAL) 600 MG TABS Take 600 mg by mouth daily.      . diclofenac sodium (VOLTAREN) 1 % GEL Apply 1 application topically 3 (three) times daily. Apply  2 grams three times a day to affected area as needed 100 g 4  . Garlic [id number] MG CAPS Take 1 capsule by mouth daily.    Marland Kitchen levocetirizine (XYZAL) 5 MG tablet Take 1 tablet (5 mg total) by mouth every evening. 90 tablet 3  . Multiple Vitamin (MULTIVITAMIN) tablet Take 1 tablet by mouth daily.    . Omega-3 Fatty Acids (FISH OIL) 1200 MG CAPS Take 1,200 mg by mouth. 2 capsules by mouth once daily in the morning     . omeprazole (PRILOSEC) 20 MG capsule TAKE 1 TABLET (20 MG TOTAL) BY MOUTH DAILY. 1 CAPSULE BY MOUTH ONCE DAILY 30 MINUTES BEFORE MEALS 30  capsule 5  . ranitidine (ZANTAC) 150 MG capsule Take 1 capsule (150 mg total) by mouth at bedtime. 30 capsule 3  . SYMBICORT 80-4.5 MCG/ACT inhaler INHALE 2 PUFFS INTO THE LUNGS 2 (TWO) TIMES DAILY. 10.2 g 2  . cyclobenzaprine (FLEXERIL) 10 MG tablet Take 1 tablet (10 mg total) by mouth 2 (two) times daily as needed for muscle spasms. 15 tablet 0  . ergocalciferol (VITAMIN D2) 50000 UNITS capsule Take 1 capsule (50,000 Units total) by mouth once a week. 4 capsule 4  . fluticasone (FLONASE) 50 MCG/ACT nasal spray Place 2 sprays into the nose daily. 16 g 2  . pravastatin (PRAVACHOL) 40 MG tablet TAKE 1 TABLET (40 MG TOTAL) BY MOUTH DAILY. 90 tablet 2   No facility-administered medications prior to visit.    Allergies  Allergen Reactions  . Fexofenadine Shortness Of Breath  . Erythromycin Nausea Only  . Oxycodone-Acetaminophen Nausea And Vomiting  . Propoxyphene N-Acetaminophen     REACTION: severe vomiting  . Simvastatin     REACTION: Myalgia    Review of Systems  Constitutional: Negative for fever and malaise/fatigue.  HENT: Negative for congestion.   Eyes: Negative for blurred vision.  Respiratory: Negative for shortness of breath.   Cardiovascular: Negative for chest pain, palpitations and leg swelling.  Gastrointestinal: Negative for nausea, abdominal pain and blood in stool.  Genitourinary: Negative for dysuria and frequency.  Musculoskeletal: Negative for falls.  Skin: Negative for rash.  Neurological: Negative for dizziness, loss of consciousness and headaches.  Endo/Heme/Allergies: Negative for environmental allergies.  Psychiatric/Behavioral: Negative for depression. The patient is not nervous/anxious.        Objective:    Physical Exam  Constitutional: She is oriented to person, place, and time. She appears well-developed and well-nourished. No distress.  HENT:  Head: Normocephalic and atraumatic.  Eyes: Conjunctivae are normal.  Neck: Neck supple. No thyromegaly  present.  Cardiovascular: Normal rate, regular rhythm and normal heart sounds.   No murmur heard. Pulmonary/Chest: Effort normal and breath sounds normal. No respiratory distress.  Abdominal: Soft. Bowel sounds are normal. She exhibits no distension and no mass. There is no tenderness.  Musculoskeletal: She exhibits no edema.  Lymphadenopathy:    She has no cervical adenopathy.  Neurological: She is alert  and oriented to person, place, and time.  Skin: Skin is warm and dry.  Psychiatric: She has a normal mood and affect. Her behavior is normal.    BP 120/72 mmHg  Pulse 81  Temp(Src) 98.3 F (36.8 C) (Oral)  Ht 5\' 10"  (1.778 m)  Wt 227 lb 2 oz (103.023 kg)  BMI 32.59 kg/m2  SpO2 97% Wt Readings from Last 3 Encounters:  05/10/15 227 lb 2 oz (103.023 kg)  02/01/15 224 lb 9.6 oz (101.878 kg)  01/29/15 222 lb 12.8 oz (101.061 kg)     Lab Results  Component Value Date   WBC 6.5 05/07/2015   HGB 12.6 05/07/2015   HCT 38.4 05/07/2015   PLT 229 05/07/2015   GLUCOSE 92 05/07/2015   CHOL 180 08/17/2014   TRIG 91.0 08/17/2014   HDL 39.50 08/17/2014   LDLCALC 122* 08/17/2014   ALT 13 08/17/2014   AST 15 08/17/2014   NA 145 05/07/2015   K 3.5 05/07/2015   CL 108 05/07/2015   CREATININE 1.31* 05/07/2015   BUN 15 05/07/2015   CO2 26 05/07/2015   TSH 3.06 08/17/2014    Lab Results  Component Value Date   TSH 3.06 08/17/2014   Lab Results  Component Value Date   WBC 6.5 05/07/2015   HGB 12.6 05/07/2015   HCT 38.4 05/07/2015   MCV 86.3 05/07/2015   PLT 229 05/07/2015   Lab Results  Component Value Date   NA 145 05/07/2015   K 3.5 05/07/2015   CO2 26 05/07/2015   GLUCOSE 92 05/07/2015   BUN 15 05/07/2015   CREATININE 1.31* 05/07/2015   BILITOT 0.5 08/17/2014   ALKPHOS 62 08/17/2014   AST 15 08/17/2014   ALT 13 08/17/2014   PROT 7.8 08/17/2014   ALBUMIN 4.0 08/17/2014   CALCIUM 9.1 05/07/2015   ANIONGAP 11 05/07/2015   GFR 62.35 08/17/2014   Lab Results    Component Value Date   CHOL 180 08/17/2014   Lab Results  Component Value Date   HDL 39.50 08/17/2014   Lab Results  Component Value Date   LDLCALC 122* 08/17/2014   Lab Results  Component Value Date   TRIG 91.0 08/17/2014   Lab Results  Component Value Date   CHOLHDL 5 08/17/2014   No results found for: HGBA1C     Assessment & Plan:   Problem List Items Addressed This Visit    Osteopenia    Encouraged to get adequate exercise, calcium and vitamin d intake      Relevant Orders   Vitamin D 1,25 dihydroxy   CBC   Comprehensive metabolic panel   TSH   Lipid panel   Hyperlipidemia, mixed    Did not tolerate statin had leg pain and stopped Pravastatin, leg pain has improved, encouraged heart healthy diet, avoid trans fats, minimize simple carbs and saturated fats. Increase exercise as tolerated      Relevant Orders   Vitamin D 1,25 dihydroxy   CBC   Comprehensive metabolic panel   TSH   Lipid panel   Essential hypertension    Well controlled, no changes to meds. Encouraged heart healthy diet such as the DASH diet and exercise as tolerated.       Relevant Orders   Vitamin D 1,25 dihydroxy   CBC   Comprehensive metabolic panel   TSH   Lipid panel    Other Visit Diagnoses    Muscle weakness    -  Primary    Relevant  Orders    Ambulatory referral to Physical Therapy    Vitamin D 1,25 dihydroxy    CBC    Comprehensive metabolic panel    TSH    Lipid panel    Muscle ache of extremity        Relevant Orders    Ambulatory referral to Physical Therapy    Vitamin D 1,25 dihydroxy    CBC    Comprehensive metabolic panel    TSH    Lipid panel    Chronic fatigue        Relevant Orders    Ambulatory referral to Physical Therapy    Vitamin D 1,25 dihydroxy    Comprehensive metabolic panel    TSH    Lipid panel       I have discontinued Ms. Burrous's pravastatin. I have also changed her fluticasone. Additionally, I am having her maintain her aspirin,  calcium carbonate, Fish Oil, Garlic, multivitamin, SYMBICORT, amLODipine, diclofenac sodium, omeprazole, acetaminophen, levocetirizine, ranitidine, acetaminophen-codeine, cyclobenzaprine, and ergocalciferol.  Meds ordered this encounter  Medications  . fluticasone (FLONASE) 50 MCG/ACT nasal spray    Sig: Place 2 sprays into both nostrils daily.    Dispense:  16 g    Refill:  2  . cyclobenzaprine (FLEXERIL) 10 MG tablet    Sig: Take 1 tablet (10 mg total) by mouth 2 (two) times daily as needed for muscle spasms.    Dispense:  30 tablet    Refill:  1  . ergocalciferol (VITAMIN D2) 50000 units capsule    Sig: Take 1 capsule (50,000 Units total) by mouth once a week.    Dispense:  4 capsule    Refill:  4     Penni Homans, MD

## 2015-05-10 NOTE — Progress Notes (Signed)
Pre visit review using our clinic review tool, if applicable. No additional management support is needed unless otherwise documented below in the visit note. 

## 2015-05-10 NOTE — Patient Instructions (Addendum)
Hyaline's Muscle Cramp Medication. Salonpas with lidocaine and aspercreme  Muscle Cramps and Spasms Muscle cramps and spasms occur when a muscle or muscles tighten and you have no control over this tightening (involuntary muscle contraction). They are a common problem and can develop in any muscle. The most common place is in the calf muscles of the leg. Both muscle cramps and muscle spasms are involuntary muscle contractions, but they also have differences:   Muscle cramps are sporadic and painful. They may last a few seconds to a quarter of an hour. Muscle cramps are often more forceful and last longer than muscle spasms.  Muscle spasms may or may not be painful. They may also last just a few seconds or much longer. CAUSES  It is uncommon for cramps or spasms to be due to a serious underlying problem. In many cases, the cause of cramps or spasms is unknown. Some common causes are:   Overexertion.   Overuse from repetitive motions (doing the same thing over and over).   Remaining in a certain position for a long period of time.   Improper preparation, form, or technique while performing a sport or activity.   Dehydration.   Injury.   Side effects of some medicines.   Abnormally low levels of the salts and ions in your blood (electrolytes), especially potassium and calcium. This could happen if you are taking water pills (diuretics) or you are pregnant.  Some underlying medical problems can make it more likely to develop cramps or spasms. These include, but are not limited to:   Diabetes.   Parkinson disease.   Hormone disorders, such as thyroid problems.   Alcohol abuse.   Diseases specific to muscles, joints, and bones.   Blood vessel disease where not enough blood is getting to the muscles.  HOME CARE INSTRUCTIONS   Stay well hydrated. Drink enough water and fluids to keep your urine clear or pale yellow.  It may be helpful to massage, stretch, and relax  the affected muscle.  For tight or tense muscles, use a warm towel, heating pad, or hot shower water directed to the affected area.  If you are sore or have pain after a cramp or spasm, applying ice to the affected area may relieve discomfort.  Put ice in a plastic bag.  Place a towel between your skin and the bag.  Leave the ice on for 15-20 minutes, 03-04 times a day.  Medicines used to treat a known cause of cramps or spasms may help reduce their frequency or severity. Only take over-the-counter or prescription medicines as directed by your caregiver. SEEK MEDICAL CARE IF:  Your cramps or spasms get more severe, more frequent, or do not improve over time.  MAKE SURE YOU:   Understand these instructions.  Will watch your condition.  Will get help right away if you are not doing well or get worse.   This information is not intended to replace advice given to you by your health care provider. Make sure you discuss any questions you have with your health care provider.   Document Released: 06/20/2001 Document Revised: 04/25/2012 Document Reviewed: 12/16/2011 Elsevier Interactive Patient Education [employer].

## 2015-05-24 ENCOUNTER — Ambulatory Visit: Payer: Medicare Other | Attending: Family Medicine

## 2015-05-24 DIAGNOSIS — R2689 Other abnormalities of gait and mobility: Secondary | ICD-10-CM | POA: Insufficient documentation

## 2015-05-24 DIAGNOSIS — M6281 Muscle weakness (generalized): Secondary | ICD-10-CM | POA: Diagnosis present

## 2015-05-24 NOTE — Therapy (Signed)
[facility] [address] [place], [place], [address] Phone: [phone]   Fax:  [phone]  Physical Therapy Evaluation  Patient Details  Name: Terri Mckee MRN: [id number] Date of Birth: 02/08/1940 Referring Provider: Dr. Penni Homans  Encounter Date: 05/24/2015      PT End of Session - 05/24/15 1037    Visit Number 1   Number of Visits 9   Date for PT Re-Evaluation 06/23/15   Authorization Type BCBS Medicare, G-code and progress note every 10th visit.   PT Start Time [phone]   PT Stop Time 1013   PT Time Calculation (min) 42 min   Equipment Utilized During Treatment Gait belt   Activity Tolerance Patient limited by fatigue   Behavior During Therapy WFL for tasks assessed/performed      Past Medical History  Diagnosis Date  . Hypertension   . 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 History of cyst of breast   . 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 Colon polyps   . Elevated serum creatinine     with ACE inhibitor (Normal MRA of renal arteries)  . Arthritis     back  . GERD (gastroesophageal reflux disease)   . Cataract   . Hoarseness of voice 03/13/2012  . Cervical cancer screening 07/07/2012  . Skin lesion 07/10/2012  . Urinary incontinence 07/10/2012  . Acute upper respiratory infection 04/09/2014  . Osteopenia 08/17/2014  . Medicare annual wellness visit, subsequent 09/02/2014  . Muscle spasm of left lower extremity 03/21/2007    Qualifier: Diagnosis of  By: Wynona Luna     Past Surgical History  Procedure Laterality Date  . Ovary surgery      right ovary & tube replacement  . Wrist ganglion excision      rt  . Appendectomy    . Breast cyst excision      right breast, twice    There were no vitals filed for this visit.       Subjective Assessment - 05/24/15 0937    Subjective Pt reported B leg weakness has been getting  progressively worse over the last few months, and she's having difficulty climbing stairs at home. Pt reported she went to the hospital last week and was dx with L leg spasms and testing was negative for DVTs.  Pt denied falls in last 6 months. Pt also reported she has incr. weakness in B hands, has difficulty gripping and turning objects. Pt has difficulty walking long distances.   Pertinent History HTN, HOH, LBP, arthritis, B cataracts   Patient Stated Goals To be able to walk better and strengthen legs.   Currently in Pain? Yes   Pain Score 4    Pain Location Neck   Pain Orientation Left;Right;Mid   Pain Descriptors / Indicators Aching   Pain Type Acute pain   Pain Onset In the past 7 days   Pain Frequency Constant   Aggravating Factors  lying down    Pain Relieving Factors pain meds            [facility] PT Assessment - 05/24/15 0942    Assessment   Medical Diagnosis Muscle weakness, muscle ache of extremity, chronic fatigue   Referring Provider Dr. Penni Homans   Onset Date/Surgical Date 03/24/15   Hand Dominance Right   Prior Therapy none   Precautions   Precautions  Fall   Precaution Comments based on BERG score   Restrictions   Weight Bearing Restrictions No   Balance Screen   Has the patient fallen in the past 6 months No   Has the patient had a decrease in activity level because of a fear of falling?  No   Is the patient reluctant to leave their home because of a fear of falling?  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 She is more of a companion, she occasionally has to warm up meals, help her dress, get a glass of water   Leisure Go to [employer], likes to watch sports   Cognition   Overall Cognitive Status Within Functional Limits for tasks assessed  pt reports she occasionally forgets thiings   Sensation   Light Touch Appears Intact   Additional Comments Pt denied N/T.   Coordination   Gross Motor Movements are Fluid and Coordinated Yes   Fine Motor Movements are Fluid and Coordinated Yes   ROM / Strength   AROM / PROM / Strength AROM;Strength   AROM   Overall AROM  Within functional limits for tasks performed   Overall AROM Comments BUE/LE   Strength   Overall Strength Deficits   Overall Strength Comments B UE and LE WFL except for B hip flex: 3+/5 and hip abd/add: 3+/5.   Transfers   Transfers Sit to Stand;Stand to Sit   Sit to Stand 5: Supervision;With upper extremity assist;From chair/3-in-1   Stand to Sit 5: Supervision;With upper extremity assist;To chair/3-in-1   Ambulation/Gait   Ambulation/Gait Yes   Ambulation/Gait Assistance 5: Supervision   Ambulation/Gait Assistance Details No overt LOB episodes.   Ambulation Distance (Feet) 75 Feet   Assistive device None   Gait Pattern Step-through pattern;Decreased stride length;Decreased weight shift to right;Decreased weight shift to left;Trunk flexed  B foot pronation   Ambulation Surface Level;Indoor   Gait velocity 2.28ft/sec.  no AD   Standardized Balance Assessment   Standardized Balance Assessment Berg Balance Test;Timed Up and Go Test   Berg Balance Test   Sit to Stand Able to stand  independently using hands   Standing Unsupported Able to stand safely 2 minutes   Sitting with Back Unsupported but Feet Supported on Floor or Stool Able to sit safely and securely 2 minutes   Stand to Sit  Sits safely with minimal use of hands   Transfers Able to transfer safely, definite need of hands   Standing Unsupported with Eyes Closed Able to stand 10 seconds safely   Standing Ubsupported with Feet Together Able to place feet together independently and stand for 1 minute with supervision   From Standing, Reach Forward with Outstretched Arm Can reach confidently >25 cm (10")   From Standing Position, Pick up Object from Floor Able to pick up shoe, needs supervision   From Standing Position, Turn  to Look Behind Over each Shoulder Looks behind one side only/other side shows less weight shift   Turn 360 Degrees Able to turn 360 degrees safely but slowly   Standing Unsupported, Alternately Place Feet on Step/Stool Able to complete 4 steps without aid or supervision   Standing Unsupported, One Foot in Front Able to plae foot ahead of the other independently and hold 30 seconds   Standing on One Leg Tries to lift leg/unable to hold 3 seconds but remains standing independently   Total Score 43   Berg comment: Pt required seated rest breaks 2/2 fatigue.   Timed Up and Go Test   TUG Normal TUG   Normal TUG (seconds) 16.8  no AD                           PT Education - 05/24/15 1037    Education provided Yes   Education Details PT discuseed frequency/duration and outcome measure results and functional implications.   Person(s) Educated Patient   Methods Explanation   Comprehension Verbalized understanding          PT Short Term Goals - 05/24/15 1042    PT SHORT TERM GOAL #1   Title same as LTGs           PT Long Term Goals - 05/24/15 1042    PT LONG TERM GOAL #1   Title Pt will be IND in HEP to improve balance and strength. Target date: 06/21/15   Status New   PT LONG TERM GOAL #2   Title Pt will verbalize plans to join community fitness center to maintain gains made during PT. Target date: 06/21/15   Status New   PT LONG TERM GOAL #3   Title Pt will amb. 500'  over even/uneven terrain IND to improve functional mobility. Target date: 06/21/15   Status New   PT LONG TERM GOAL #4   Title Assess stair climbing ability and write goal. Target date: 06/21/15   Status New   PT LONG TERM GOAL #5   Title Perform 6MWT and write goal. Target date: 06/21/15   Status New   Additional Long Term Goals   Additional Long Term Goals Yes   PT LONG TERM GOAL #6   Title Pt will improve BERG score to >/=48/56 to decr. falls risk. Target date: 06/21/15   Status New   PT LONG TERM GOAL #7   Title Pt will improve gait speed without AD to >/=2.63ft/sec to safely amb. in the community. Target date: 06/21/15   Status New               Plan - 05/24/15 1038    Clinical Impression Statement Pt is a pleasant 75y/o female presenting OPPT neuro with BLE weakness and fatigue. The following deficits were noted during pt exam: gait deviations, impaired balance, decreased endurance, postural dysfunction, and impaired strength. Pt's BERG score and TUG time indicate pt is at significant risk for falls. Pt's gait speed indicates pt is not able to safely amb. in the community.   Rehab Potential Good   Clinical Impairments Affecting Rehab Potential co-morbidities   PT Frequency 2x / week   PT Duration 4 weeks   PT Treatment/Interventions ADLs/Self Care Home Management;Biofeedback;Balance training;Therapeutic exercise;Manual techniques;Vestibular;Therapeutic activities;Functional mobility training;Stair training;Gait training;Patient/family education;Orthotic Fit/Training;DME Instruction;Neuromuscular re-education   PT Next Visit Plan Initiate balance/strength HEP and perform 6MWT   Recommended Other Services Potential OT eval to address hand weakness  Consulted and Agree with Plan of Care Patient      Patient will benefit from skilled therapeutic intervention in order to improve the following deficits and impairments:  Abnormal gait, Decreased endurance, Pain, Postural dysfunction,  Impaired flexibility, Decreased balance, Decreased mobility, Decreased knowledge of use of DME, Decreased strength (PT will monitor pain but will not directly address.)  Visit Diagnosis: Other abnormalities of gait and mobility - Plan: PT plan of care cert/re-cert  Muscle weakness (generalized) - Plan: PT plan of care cert/re-cert      G-Codes - [id number]    Functional Assessment Tool Used TUG: 16.8sec.; BERG: 43/56 and gait speed: 2.68ft/sec.   Functional Limitation Mobility: Walking and moving around   Mobility: Walking and Moving Around Current Status [id number]) At least 40 percent but less than 60 percent impaired, limited or restricted   Mobility: Walking and Moving Around Goal Status [phone]) At least 1 percent but less than 20 percent impaired, limited or restricted       Problem List Patient Active Problem List   Diagnosis Date Noted  . Left shoulder pain 01/29/2015  . Breast cancer screening 09/02/2014  . Decreased visual acuity 09/02/2014  . Hearing loss 09/02/2014  . Hip pain 09/02/2014  . Medicare annual wellness visit, subsequent 09/02/2014  . Osteopenia 08/17/2014  . Right knee pain 09/24/2013  . Low back pain 12/03/2012  . Skin lesion 07/10/2012  . Urinary incontinence 07/10/2012  . Cervical cancer screening 07/07/2012  . Cough 04/01/2012  . Hoarseness of voice 03/13/2012  . Cervical radiculopathy 12/03/2011  . GERD 09/13/2009  . BREAST MASS, LEFT 02/22/2009  . HIP PAIN, RIGHT, CHRONIC 12/21/2008  . 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 Hyperlipidemia, mixed 10/28/2007  . INSOMNIA 10/28/2007  . Allergic rhinitis 07/01/2007  . WRIST PAIN, RIGHT 03/21/2007  . Muscle spasm of left lower extremity 03/21/2007  . Essential hypertension 08/06/2006  . 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

## 2015-05-26 NOTE — Assessment & Plan Note (Signed)
Encouraged to get adequate exercise, calcium and vitamin d intake 

## 2015-05-26 NOTE — Assessment & Plan Note (Addendum)
Did not tolerate statin had leg pain and stopped Pravastatin, leg pain has improved, encouraged heart healthy diet, avoid trans fats, minimize simple carbs and saturated fats. Increase exercise as tolerated

## 2015-05-26 NOTE — Assessment & Plan Note (Signed)
Well controlled, no changes to meds. Encouraged heart healthy diet such as the DASH diet and exercise as tolerated.  °

## 2015-05-29 ENCOUNTER — Ambulatory Visit: Payer: Medicare Other | Admitting: Physical Therapy

## 2015-05-29 ENCOUNTER — Encounter: Payer: Self-pay | Admitting: Physical Therapy

## 2015-05-29 DIAGNOSIS — R2689 Other abnormalities of gait and mobility: Secondary | ICD-10-CM

## 2015-05-29 DIAGNOSIS — M6281 Muscle weakness (generalized): Secondary | ICD-10-CM

## 2015-05-29 NOTE — Patient Instructions (Signed)
Walking Program:  Begin walking for exercise for 6 minutes, 2-3  times/day, 4-5 days/week.   * walk away from home for 3 minutes and then back to home for 3 minutes * increase the time out and back as you increase your total time walking. Progress your walking program by adding 2 minutes to your routine each week, as tolerated. Be sure to wear good walking shoes, walk in a safe environment and only progress to your tolerance.      Functional Quadriceps: Sit to Stand    Sit on edge of chair, feet flat on floor. Stand upright, extending knees fully. Repeat _10_ times per set. Do _1_ sets per session. Do _1-2_ sessions per day.  http://orth.exer.us/735   Copyright  VHI. All rights reserved.    Bridge    Lie back, legs bent. Pull stomach in tight and lift hips up off bed. Hold for 3 seconds.  Repeat _10 times. Do _1-2_ sessions per day.  http://pm.exer.us/55   Copyright  VHI. All rights reserved.    Strengthening: Hip Abductor - Resisted    With red band looped around both legs above knees, push thighs apart. Hold for 5 seconds. Repeat __10__ times per set. Do _1_ sets per session. Do _1-2_ sessions per day.  http://orth.exer.us/688   Copyright  VHI. All rights reserved.

## 2015-05-30 ENCOUNTER — Ambulatory Visit: Payer: Medicare Other | Admitting: Physical Therapy

## 2015-05-30 ENCOUNTER — Encounter: Payer: Self-pay | Admitting: Physical Therapy

## 2015-05-30 VITALS — BP 117/77 | HR 72

## 2015-05-30 DIAGNOSIS — R2689 Other abnormalities of gait and mobility: Secondary | ICD-10-CM | POA: Diagnosis not present

## 2015-05-30 DIAGNOSIS — M6281 Muscle weakness (generalized): Secondary | ICD-10-CM

## 2015-05-30 NOTE — Therapy (Signed)
[facility] [address] [place], [place], [address] Phone: [phone]   Fax:  [phone]  Physical Therapy Treatment  Patient Details  Name: Terri Mckee MRN: [id number] Date of Birth: 1940-10-03 Referring Provider: Dr. Penni Homans  Encounter Date: 05/29/2015      PT End of Session - 05/29/15 0852    Visit Number 2   Number of Visits 9   Date for PT Re-Evaluation 06/23/15   Authorization Type BCBS Medicare, G-code and progress note every 10th visit.   PT Start Time [phone]   PT Stop Time 0930   PT Time Calculation (min) 43 min   Equipment Utilized During Treatment Gait belt   Activity Tolerance Patient limited by fatigue   Behavior During Therapy WFL for tasks assessed/performed      Past Medical History  Diagnosis Date  . Hypertension   . 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 History of cyst of breast   . 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 Colon polyps   . Elevated serum creatinine     with ACE inhibitor (Normal MRA of renal arteries)  . Arthritis     back  . GERD (gastroesophageal reflux disease)   . Cataract   . Hoarseness of voice 03/13/2012  . Cervical cancer screening 07/07/2012  . Skin lesion 07/10/2012  . Urinary incontinence 07/10/2012  . Acute upper respiratory infection 04/09/2014  . Osteopenia 08/17/2014  . Medicare annual wellness visit, subsequent 09/02/2014  . Muscle spasm of left lower extremity 03/21/2007    Qualifier: Diagnosis of  By: Wynona Luna     Past Surgical History  Procedure Laterality Date  . Ovary surgery      right ovary & tube replacement  . Wrist ganglion excision      rt  . Appendectomy    . Breast cyst excision      right breast, twice    There were no vitals filed for this visit.      Subjective Assessment - 05/29/15 0850    Subjective No falls to report. Does have new onset of  cervical pain that started about 4 days ago, woke up with the pain. Has been using heat and tylenol without any changes noted.    Pertinent History HTN, HOH, LBP, arthritis, B cataracts   Patient Stated Goals To be able to walk better and strengthen legs.   Currently in Pain? Yes   Pain Score 3    Pain Location Neck   Pain Orientation Mid;Lower   Pain Descriptors / Indicators Aching;Sore   Pain Type Acute pain   Pain Onset 1 to 4 weeks ago   Pain Frequency Constant   Aggravating Factors  unknown    Pain Relieving Factors pain meds, heat help some            [facility] PT Assessment - 05/29/15 0856    6 Minute Walk- Baseline   BP (mmHg) 123/88 mmHg   HR (bpm) 68   02 Sat (%RA) 98 %   Modified Borg Scale for Dyspnea 0- Nothing at all   Perceived Rate of Exertion (Borg) 6-   6 Minute walk- Post Test   BP (mmHg) 110/67 mmHg   HR (bpm) 75   02 Sat (%RA) 96 %   Modified Borg Scale for Dyspnea 2- Mild shortness of breath   Perceived Rate of  Exertion (Borg) 13- Somewhat hard   6 minute walk test results    Aerobic Endurance Distance Walked 1023   Endurance additional comments no rest breaks taken with testing. No dizziness or other symptoms reported with BP drop during testing.     Treatment continued: Educated on and issued exercises for strengthening, emphasis on hip strengthening due to complaints of pain with walking test and notable weakness with gait. Refer to pt instructions for full details.          PT Education - 05/29/15 0929    Education provided Yes   Education Details 6 MWT results; HEP: walking program, exercises for strengthening.   Person(s) Educated Patient   Methods Explanation;Demonstration;Verbal cues   Comprehension Verbalized understanding;Returned demonstration;Need further instruction;Verbal cues required          PT Short Term Goals - 05/24/15 1042    PT SHORT TERM GOAL #1   Title same as LTGs           PT Long Term Goals - 05/24/15 1042     PT LONG TERM GOAL #1   Title Pt will be IND in HEP to improve balance and strength. Target date: 06/21/15   Status New   PT LONG TERM GOAL #2   Title Pt will verbalize plans to join community fitness center to maintain gains made during PT. Target date: 06/21/15   Status New   PT LONG TERM GOAL #3   Title Pt will amb. 500' over even/uneven terrain IND to improve functional mobility. Target date: 06/21/15   Status New   PT LONG TERM GOAL #4   Title Assess stair climbing ability and write goal. Target date: 06/21/15   Status New   PT LONG TERM GOAL #5   Title Perform 6MWT and write goal. Target date: 06/21/15   Status New   Additional Long Term Goals   Additional Long Term Goals Yes   PT LONG TERM GOAL #6   Title Pt will improve BERG score to >/=48/56 to decr. falls risk. Target date: 06/21/15   Status New   PT LONG TERM GOAL #7   Title Pt will improve gait speed without AD to >/=2.1ft/sec to safely amb. in the community. Target date: 06/21/15   Status New            Plan - 05/29/15 0853    Clinical Impression Statement In today's session the 6 minute walk test was completed to establish pt's baseline and HEP was issued without any complaints/issues reported. Pt. is making steady progress toward goals.    Rehab Potential Good   Clinical Impairments Affecting Rehab Potential co-morbidities   PT Frequency 2x / week   PT Duration 4 weeks   PT Treatment/Interventions ADLs/Self Care Home Management;Biofeedback;Balance training;Therapeutic exercise;Manual techniques;Vestibular;Therapeutic activities;Functional mobility training;Stair training;Gait training;Patient/family education;Orthotic Fit/Training;DME Instruction;Neuromuscular re-education   PT Next Visit Plan continue to work on strengthening, gait and balance   Consulted and Agree with Plan of Care Patient      Patient will benefit from skilled therapeutic intervention in order to improve the following deficits and impairments:   Abnormal gait, Decreased endurance, Pain, Postural dysfunction, Impaired flexibility, Decreased balance, Decreased mobility, Decreased knowledge of use of DME, Decreased strength (PT will monitor pain but will not directly address.)  Visit Diagnosis: Other abnormalities of gait and mobility  Muscle weakness (generalized)     Problem List Patient Active Problem List   Diagnosis Date Noted  . Left shoulder pain 01/29/2015  . Breast  cancer screening 09/02/2014  . Decreased visual acuity 09/02/2014  . Hearing loss 09/02/2014  . Hip pain 09/02/2014  . Medicare annual wellness visit, subsequent 09/02/2014  . Osteopenia 08/17/2014  . Right knee pain 09/24/2013  . Low back pain 12/03/2012  . Skin lesion 07/10/2012  . Urinary incontinence 07/10/2012  . Cervical cancer screening 07/07/2012  . Cough 04/01/2012  . Hoarseness of voice 03/13/2012  . Cervical radiculopathy 12/03/2011  . GERD 09/13/2009  . BREAST MASS, LEFT 02/22/2009  . HIP PAIN, RIGHT, CHRONIC 12/21/2008  . 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 Hyperlipidemia, mixed 10/28/2007  . INSOMNIA 10/28/2007  . Allergic rhinitis 07/01/2007  . WRIST PAIN, RIGHT 03/21/2007  . Muscle spasm of left lower extremity 03/21/2007  . Essential hypertension 08/06/2006  . 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, [place], [place] [address] [phone] 05/30/2015, 8:47 AM   Name: JOSEE STIGEN MRN: [id number] Date of Birth: 16-Mar-1940

## 2015-05-31 NOTE — Therapy (Signed)
[facility] [address] [place], [place], [address] Phone: [phone]   Fax:  [phone]  Physical Therapy Treatment  Patient Details  Name: Terri Mckee MRN: [id number] Date of Birth: 26-Apr-1940 Referring Provider: Dr. Penni Homans  Encounter Date: 05/30/2015      PT End of Session - 05/30/15 0936    Visit Number 3   Number of Visits 9   Date for PT Re-Evaluation 06/23/15   Authorization Type BCBS Medicare, G-code and progress note every 10th visit.   PT Start Time 0932   PT Stop Time 1015   PT Time Calculation (min) 43 min   Equipment Utilized During Treatment Gait belt   Activity Tolerance Patient limited by fatigue   Behavior During Therapy WFL for tasks assessed/performed      Past Medical History  Diagnosis Date  . Hypertension   . 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 History of cyst of breast   . 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 Colon polyps   . Elevated serum creatinine     with ACE inhibitor (Normal MRA of renal arteries)  . Arthritis     back  . GERD (gastroesophageal reflux disease)   . Cataract   . Hoarseness of voice 03/13/2012  . Cervical cancer screening 07/07/2012  . Skin lesion 07/10/2012  . Urinary incontinence 07/10/2012  . Acute upper respiratory infection 04/09/2014  . Osteopenia 08/17/2014  . Medicare annual wellness visit, subsequent 09/02/2014  . Muscle spasm of left lower extremity 03/21/2007    Qualifier: Diagnosis of  By: Wynona Luna     Past Surgical History  Procedure Laterality Date  . Ovary surgery      right ovary & tube replacement  . Wrist ganglion excision      rt  . Appendectomy    . Breast cyst excision      right breast, twice    Filed Vitals:   05/30/15 0934  BP: 117/77  Pulse: 72        Subjective Assessment - 05/30/15 0934    Subjective No new complaints. Sitll has neck  pain. No falls. Has tried the sit<>stands at home without any issues.    Patient Stated Goals To be able to walk better and strengthen legs.   Currently in Pain? Yes   Pain Score 3    Pain Location Neck   Pain Orientation Lower;Mid   Pain Descriptors / Indicators Aching;Sore   Pain Type Acute pain   Pain Onset 1 to 4 weeks ago   Pain Frequency Constant   Aggravating Factors  unknown   Pain Relieving Factors pain meds, heat helps some            [facility] Adult PT Treatment/Exercise - 05/30/15 0939    Transfers   Number of Reps 10 reps;2 sets   Transfer Cueing sit<>stand with OH press using 2# weighted ball   High Level Balance   High Level Balance Activities Marching forwards;Marching backwards;Side stepping  tandem walk fwd/bwd, toe and heel walk fwd/bwd   High Level Balance Comments at counter top with light to no UE support x 3 laps each with min guard assist and cues on posture and ex form.  Balance Exercises - 05/30/15 0952    Balance Exercises: Standing   Rockerboard Anterior/posterior;Lateral;Head turns;EO;EC;Other time (comment);10 reps   Balance Beam blue foam balance beam: side stepping left<>right x 3 laps each way with light to no UE support; standing with feet across beam: alternating forward heel taps and backwards toe taps x 10 each bil legs with light UE support. cues of form, posture and weight shifting.                              PT Short Term Goals - 05/24/15 1042    PT SHORT TERM GOAL #1   Title same as LTGs           PT Long Term Goals - 05/24/15 1042    PT LONG TERM GOAL #1   Title Pt will be IND in HEP to improve balance and strength. Target date: 06/21/15   Status New   PT LONG TERM GOAL #2   Title Pt will verbalize plans to join community fitness center to maintain gains made during PT. Target date: 06/21/15   Status New   PT LONG TERM GOAL #3   Title Pt will amb. 500' over even/uneven terrain IND to  improve functional mobility. Target date: 06/21/15   Status New   PT LONG TERM GOAL #4   Title Assess stair climbing ability and write goal. Target date: 06/21/15   Status New   PT LONG TERM GOAL #5   Title Perform 6MWT and write goal. Target date: 06/21/15   Status New   Additional Long Term Goals   Additional Long Term Goals Yes   PT LONG TERM GOAL #6   Title Pt will improve BERG score to >/=48/56 to decr. falls risk. Target date: 06/21/15   Status New   PT LONG TERM GOAL #7   Title Pt will improve gait speed without AD to >/=2.59ft/sec to safely amb. in the community. Target date: 06/21/15   Status New           Plan - 05/30/15 0936    Clinical Impression Statement Continued to focus on strengthening and balance today without any issues reported. Pt is making steady progress toward goals.    Rehab Potential Good   Clinical Impairments Affecting Rehab Potential co-morbidities   PT Frequency 2x / week   PT Duration 4 weeks   PT Treatment/Interventions ADLs/Self Care Home Management;Biofeedback;Balance training;Therapeutic exercise;Manual techniques;Vestibular;Therapeutic activities;Functional mobility training;Stair training;Gait training;Patient/family education;Orthotic Fit/Training;DME Instruction;Neuromuscular re-education   PT Next Visit Plan continue to work on strengthening, gait and balance   Consulted and Agree with Plan of Care Patient      Patient will benefit from skilled therapeutic intervention in order to improve the following deficits and impairments:  Abnormal gait, Decreased endurance, Pain, Postural dysfunction, Impaired flexibility, Decreased balance, Decreased mobility, Decreased knowledge of use of DME, Decreased strength (PT will monitor pain but will not directly address.)  Visit Diagnosis: Other abnormalities of gait and mobility  Muscle weakness (generalized)     Problem List Patient Active Problem List   Diagnosis Date Noted  . Left shoulder pain  01/29/2015  . Breast cancer screening 09/02/2014  . Decreased visual acuity 09/02/2014  . Hearing loss 09/02/2014  . Hip pain 09/02/2014  . Medicare annual wellness visit, subsequent 09/02/2014  . Osteopenia 08/17/2014  . Right knee pain 09/24/2013  . Low back pain 12/03/2012  . Skin lesion 07/10/2012  . Urinary incontinence 07/10/2012  .  Cervical cancer screening 07/07/2012  . Cough 04/01/2012  . Hoarseness of voice 03/13/2012  . Cervical radiculopathy 12/03/2011  . GERD 09/13/2009  . BREAST MASS, LEFT 02/22/2009  . HIP PAIN, RIGHT, CHRONIC 12/21/2008  . 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 Hyperlipidemia, mixed 10/28/2007  . INSOMNIA 10/28/2007  . Allergic rhinitis 07/01/2007  . WRIST PAIN, RIGHT 03/21/2007  . Muscle spasm of left lower extremity 03/21/2007  . Essential hypertension 08/06/2006  . 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

## 2015-06-03 ENCOUNTER — Encounter: Payer: Self-pay | Admitting: Physical Therapy

## 2015-06-03 ENCOUNTER — Ambulatory Visit: Payer: Medicare Other | Admitting: Physical Therapy

## 2015-06-03 DIAGNOSIS — R2689 Other abnormalities of gait and mobility: Secondary | ICD-10-CM

## 2015-06-03 DIAGNOSIS — M6281 Muscle weakness (generalized): Secondary | ICD-10-CM

## 2015-06-04 NOTE — Therapy (Addendum)
[facility] [address] [place], [place], [address] Phone: [phone]   Fax:  [phone]  Physical Therapy Treatment  Patient Details  Name: Terri Mckee MRN: [id number] Date of Birth: [birth year] Referring Provider: Dr. Penni Homans  Encounter Date: 06/03/2015      PT End of Session - 06/03/15 1236    Visit Number 4   Number of Visits 9   Date for PT Re-Evaluation 06/23/15   Authorization Type BCBS Medicare, G-code and progress note every 10th visit.   PT Start Time 1233   PT Stop Time 1315   PT Time Calculation (min) 42 min   Equipment Utilized During Treatment Gait belt   Activity Tolerance Patient limited by fatigue   Behavior During Therapy WFL for tasks assessed/performed      Past Medical History  Diagnosis Date  . Hypertension   . 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 History of cyst of breast   . 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 Colon polyps   . Elevated serum creatinine     with ACE inhibitor (Normal MRA of renal arteries)  . Arthritis     back  . GERD (gastroesophageal reflux disease)   . Cataract   . Hoarseness of voice 03/13/2012  . Cervical cancer screening 07/07/2012  . Skin lesion 07/10/2012  . Urinary incontinence 07/10/2012  . Acute upper respiratory infection 04/09/2014  . Osteopenia 08/17/2014  . Medicare annual wellness visit, subsequent 09/02/2014  . Muscle spasm of left lower extremity 03/21/2007    Qualifier: Diagnosis of  By: Wynona Luna     Past Surgical History  Procedure Laterality Date  . Ovary surgery      right ovary & tube replacement  . Wrist ganglion excision      rt  . Appendectomy    . Breast cyst excision      right breast, twice    There were no vitals filed for this visit.      Subjective Assessment - 06/03/15 1235    Subjective No new complaints. Neck is better. HEP is going  well, however has not started the walking program portion as of yet.   Pertinent History HTN, HOH, LBP, arthritis, B cataracts   Patient Stated Goals To be able to walk better and strengthen legs.   Currently in Pain? No/denies   Pain Score 0-No pain             [facility] Adult PT Treatment/Exercise - 06/03/15 1237    Transfers   Transfers Sit to Stand;Stand to Sit   Ambulation/Gait   Ambulation/Gait Yes   Ambulation/Gait Assistance 5: Supervision   Ambulation/Gait Assistance Details occasional cues on posture and increased step length. no balance loss or toe scuffing noted.   Ambulation Distance (Feet) 500 Feet   Assistive device None   Gait Pattern Step-through pattern;Decreased stride length;Decreased weight shift to right;Decreased weight shift to left;Trunk flexed   Ambulation Surface Level;Unlevel;Indoor;Outdoor;Paved;Gravel;Grass   Stairs Yes   Stairs Assistance 5: Supervision   Stairs Assistance Details (indicate cue type and reason) increased time needed with reciprocal pattern. pt reports she does step too pattern mostly, however she has been "challenging" herself this way lately. no balance issues noted,  only increased time needed especially with descending the stairs.   Stair Management Technique One rail Right;Alternating pattern;Forwards   Number of Stairs  4   High Level Balance   High Level Balance Activities Marching forwards;Marching backwards  toe walking fwd/bwd   High Level Balance Comments in parallel bars with light to no UE support x 3 laps each/each way, cues on posture and ex form   Knee/Hip Exercises: Standing   Hip Abduction AROM;Stengthening;Both;1 set;10 reps;Knee straight;Limitations   Abduction Limitations with red band around ankles: alternating legs with cues on form and posture x 10 each side   Hip Extension AROM;Stengthening;Both;1 set;10 reps;Knee straight;Limitations   Extension Limitations with red band around ankles: alternating legs with cues  on form/technique x 10 each side   Knee/Hip Exercises: Supine   Bridges AROM;Strengthening;Both;1 set;10 reps;Limitations   Bridges Limitations 5 sec holds for 10 reps, cues to elevate pelvis as high as possible, and keep it level   Other Supine Knee/Hip Exercises hooklying with red band around legs just above knees: bil hip fall outs/clamshell with 5 sec hold x 10 reps   Knee/Hip Exercises: Sidelying   Clams with red band resistance, 3 sec holds x 10 reps each side, manual/verbal cues for form/technique and correct positioning.            PT Short Term Goals - 05/24/15 1042    PT SHORT TERM GOAL #1   Title same as LTGs           PT Long Term Goals - 06/04/15 0842    PT LONG TERM GOAL #1   Title Pt will be IND in HEP to improve balance and strength. Target date: 06/21/15   Status On-going   PT LONG TERM GOAL #2   Title Pt will verbalize plans to join community fitness center to maintain gains made during PT. Target date: 06/21/15   Status On-going   PT LONG TERM GOAL #3   Title Pt will amb. 500' over even/uneven terrain IND to improve functional mobility. Target date: 06/21/15   Status On-going   PT LONG TERM GOAL #4   Title Pt will traverse 8 steps with 1 handrail at MOD I level to improve functional mobility. Target date: 06/21/15   Baseline Pt amb. with S and 1 handrail.   Status Revised   PT LONG TERM GOAL #5   Title Pt will improve 6MWT distance to 1223' to improve endurance. Target date: 06/21/15   Baseline 1023' on 06/03/15   Status Revised   PT LONG TERM GOAL #6   Title Pt will improve BERG score to >/=48/56 to decr. falls risk. Target date: 06/21/15   Status On-going   PT LONG TERM GOAL #7   Title Pt will improve gait speed without AD to >/=2.74ft/sec to safely amb. in the community. Target date: 06/21/15   Status On-going               Plan - 06/03/15 1236    Clinical Impression Statement Today's session continued to focus on gait quaility and on LE strengthening.  Pt able to negotiate stairs today with increased time needed. Pt is making steady progress toward goals. 6 minute walk test goal updated by primary PT.   Rehab Potential Good   Clinical Impairments Affecting Rehab Potential co-morbidities   PT Frequency 2x / week   PT Duration 4 weeks   PT Treatment/Interventions ADLs/Self Care Home Management;Biofeedback;Balance training;Therapeutic exercise;Manual techniques;Vestibular;Therapeutic activities;Functional mobility training;Stair training;Gait training;Patient/family education;Orthotic Fit/Training;DME Instruction;Neuromuscular re-education   PT Next Visit Plan continue to work on strengthening, gait and balance   Consulted and Agree with Plan of Care  Patient      Patient will benefit from skilled therapeutic intervention in order to improve the following deficits and impairments:  Abnormal gait, Decreased endurance, Pain, Postural dysfunction, Impaired flexibility, Decreased balance, Decreased mobility, Decreased knowledge of use of DME, Decreased strength (PT will monitor pain but will not directly address.)  Visit Diagnosis: Other abnormalities of gait and mobility  Muscle weakness (generalized)     Problem List Patient Active Problem List   Diagnosis Date Noted  . Left shoulder pain 01/29/2015  . Breast cancer screening 09/02/2014  . Decreased visual acuity 09/02/2014  . Hearing loss 09/02/2014  . Hip pain 09/02/2014  . Medicare annual wellness visit, subsequent 09/02/2014  . Osteopenia 08/17/2014  . Right knee pain 09/24/2013  . Low back pain 12/03/2012  . Skin lesion 07/10/2012  . Urinary incontinence 07/10/2012  . Cervical cancer screening 07/07/2012  . Cough 04/01/2012  . Hoarseness of voice 03/13/2012  . Cervical radiculopathy 12/03/2011  . GERD 09/13/2009  . BREAST MASS, LEFT 02/22/2009  . HIP PAIN, RIGHT, CHRONIC 12/21/2008  . 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 Hyperlipidemia, mixed 10/28/2007  . INSOMNIA 10/28/2007  .  Allergic rhinitis 07/01/2007  . WRIST PAIN, RIGHT 03/21/2007  . Muscle spasm of left lower extremity 03/21/2007  . Essential hypertension 08/06/2006  . 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

## 2015-06-06 ENCOUNTER — Ambulatory Visit: Payer: Medicare Other | Admitting: Physical Therapy

## 2015-06-07 ENCOUNTER — Ambulatory Visit: Payer: Medicare Other | Admitting: Physical Therapy

## 2015-06-07 DIAGNOSIS — R2689 Other abnormalities of gait and mobility: Secondary | ICD-10-CM

## 2015-06-07 DIAGNOSIS — M6281 Muscle weakness (generalized): Secondary | ICD-10-CM

## 2015-06-07 NOTE — Therapy (Signed)
[facility] [address] [place], [place], [address] Phone: [phone]   Fax:  [phone]  Physical Therapy Treatment  Patient Details  Name: Terri Mckee MRN: [id number] Date of Birth: 07/09/1940 Referring Provider: Dr. Penni Homans  Encounter Date: 06/07/2015      PT End of Session - 06/07/15 1052    Visit Number 5   Number of Visits 9   Date for PT Re-Evaluation 06/23/15   Authorization Type BCBS Medicare, G-code and progress note every 10th visit.   PT Start Time 1015   PT Stop Time 1053   PT Time Calculation (min) 38 min   Activity Tolerance Patient tolerated treatment well   Behavior During Therapy WFL for tasks assessed/performed      Past Medical History  Diagnosis Date  . Hypertension   . 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 History of cyst of breast   . 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 Colon polyps   . Elevated serum creatinine     with ACE inhibitor (Normal MRA of renal arteries)  . Arthritis     back  . GERD (gastroesophageal reflux disease)   . Cataract   . Hoarseness of voice 03/13/2012  . Cervical cancer screening 07/07/2012  . Skin lesion 07/10/2012  . Urinary incontinence 07/10/2012  . Acute upper respiratory infection 04/09/2014  . Osteopenia 08/17/2014  . Medicare annual wellness visit, subsequent 09/02/2014  . Muscle spasm of left lower extremity 03/21/2007    Qualifier: Diagnosis of  By: Wynona Luna     Past Surgical History  Procedure Laterality Date  . Ovary surgery      right ovary & tube replacement  . Wrist ganglion excision      rt  . Appendectomy    . Breast cyst excision      right breast, twice    There were no vitals filed for this visit.      Subjective Assessment - 06/07/15 1026    Subjective doing well; no complaints   Currently in Pain? No/denies                          [facility] Adult PT Treatment/Exercise - 06/07/15 1026    Exercises   Exercises Knee/Hip   Knee/Hip Exercises: Aerobic   Stepper Seated L2.5 x 8 min   Knee/Hip Exercises: Standing   Heel Raises Both;15 reps   Heel Raises Limitations toe raises x 15   Knee/Hip Exercises: Seated   Long Arc Quad Both;15 reps;Weights   Long Arc Quad Weight 3 lbs.   Other Seated Knee/Hip Exercises cues to hold at end range ~ 3 sec; pt held closer to 5 sec   Marching Both;15 reps;Weights   Marching Weights 3 lbs.   Knee/Hip Exercises: Supine   Bridges 15 reps   Bridges Limitations cues for 5 sec hold (pt held closer to 10 sec with each rep)   Straight Leg Raises Both;15 reps   Straight Leg Raises Limitations 3#                  PT Short Term Goals - 05/24/15 1042    PT SHORT TERM GOAL #1   Title same as LTGs           PT Long Term Goals - 06/04/15 [id number]  PT LONG TERM GOAL #1   Title Pt will be IND in HEP to improve balance and strength. Target date: 06/21/15   Status On-going   PT LONG TERM GOAL #2   Title Pt will verbalize plans to join community fitness center to maintain gains made during PT. Target date: 06/21/15   Status On-going   PT LONG TERM GOAL #3   Title Pt will amb. 500' over even/uneven terrain IND to improve functional mobility. Target date: 06/21/15   Status On-going   PT LONG TERM GOAL #4   Title Pt will traverse 8 steps with 1 handrail at MOD I level to improve functional mobility. Target date: 06/21/15   Baseline Pt amb. with S and 1 handrail.   Status Revised   PT LONG TERM GOAL #5   Title Pt will improve 6MWT distance to 1223' to improve endurance. Target date: 06/21/15   Baseline 1023' on 06/03/15   Status Revised   PT LONG TERM GOAL #6   Title Pt will improve BERG score to >/=48/56 to decr. falls risk. Target date: 06/21/15   Status On-going   PT LONG TERM GOAL #7   Title Pt will improve gait speed without AD to >/=2.34ft/sec to  safely amb. in the community. Target date: 06/21/15   Status On-going               Plan - 06/07/15 1052    Clinical Impression Statement Pt tolerated exercises well and reported fatigue after session.  Will continue to benefit from PT to improve strength and maximize function.   PT Next Visit Plan continue to work on strengthening, gait and balance   Consulted and Agree with Plan of Care Patient      Patient will benefit from skilled therapeutic intervention in order to improve the following deficits and impairments:     Visit Diagnosis: Other abnormalities of gait and mobility  Muscle weakness (generalized)     Problem List Patient Active Problem List   Diagnosis Date Noted  . Left shoulder pain 01/29/2015  . Breast cancer screening 09/02/2014  . Decreased visual acuity 09/02/2014  . Hearing loss 09/02/2014  . Hip pain 09/02/2014  . Medicare annual wellness visit, subsequent 09/02/2014  . Osteopenia 08/17/2014  . Right knee pain 09/24/2013  . Low back pain 12/03/2012  . Skin lesion 07/10/2012  . Urinary incontinence 07/10/2012  . Cervical cancer screening 07/07/2012  . Cough 04/01/2012  . Hoarseness of voice 03/13/2012  . Cervical radiculopathy 12/03/2011  . GERD 09/13/2009  . BREAST MASS, LEFT 02/22/2009  . HIP PAIN, RIGHT, CHRONIC 12/21/2008  . 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 Hyperlipidemia, mixed 10/28/2007  . INSOMNIA 10/28/2007  . Allergic rhinitis 07/01/2007  . WRIST PAIN, RIGHT 03/21/2007  . Muscle spasm of left lower extremity 03/21/2007  . Essential hypertension 08/06/2006  . 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 [name], [place], [address] Phone: [phone]   Fax:  [phone]  Name: Terri Mckee MRN: [id number] Date of Birth: 12-Jan-1941

## 2015-06-12 ENCOUNTER — Encounter: Payer: Self-pay | Admitting: Physical Therapy

## 2015-06-12 ENCOUNTER — Ambulatory Visit: Payer: Medicare Other | Admitting: Physical Therapy

## 2015-06-12 DIAGNOSIS — R2689 Other abnormalities of gait and mobility: Secondary | ICD-10-CM

## 2015-06-12 DIAGNOSIS — M6281 Muscle weakness (generalized): Secondary | ICD-10-CM

## 2015-06-12 NOTE — Patient Instructions (Addendum)
Gym Program:  Cardio equipment: Treadmill: walking at steady pace while holding on Seated bikes are good to use as well Progress yourself toward using other standing equipment such as ellipticals  Weight machines: Do 2-4 of arm machines, 2-4 of leg machines and 1-2 core/abdminal machines. LOW weights (you should be able to control the reps in slow motions. If weights are "slamming" together, it's too much weight, lower it).  The express room in the back would be a great place to start (room with 10 machines along it's perimeter). All are good to start with except for the hamstring machine which requires you to lie on belly, use the one in the main room which is seated (3rd one in line from express room). Also do not do the steps in the express room.

## 2015-06-12 NOTE — Therapy (Signed)
[facility] [address] [place], [place], [address] Phone: [phone]   Fax:  [phone]  Physical Therapy Treatment  Patient Details  Name: Terri Mckee MRN: [id number] Date of Birth: [birth year] Referring Provider: Dr. Penni Homans  Encounter Date: 06/12/2015      PT End of Session - 06/12/15 1239    Visit Number 6   Number of Visits 9   Date for PT Re-Evaluation 06/23/15   Authorization Type BCBS Medicare, G-code and progress note every 10th visit.   PT Start Time 1231   PT Stop Time 1317   PT Time Calculation (min) 46 min   Activity Tolerance Patient tolerated treatment well   Behavior During Therapy WFL for tasks assessed/performed      Past Medical History  Diagnosis Date  . Hypertension   . 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 History of cyst of breast   . 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 Colon polyps   . Elevated serum creatinine     with ACE inhibitor (Normal MRA of renal arteries)  . Arthritis     back  . GERD (gastroesophageal reflux disease)   . Cataract   . Hoarseness of voice 03/13/2012  . Cervical cancer screening 07/07/2012  . Skin lesion 07/10/2012  . Urinary incontinence 07/10/2012  . Acute upper respiratory infection 04/09/2014  . Osteopenia 08/17/2014  . Medicare annual wellness visit, subsequent 09/02/2014  . Muscle spasm of left lower extremity 03/21/2007    Qualifier: Diagnosis of  By: Wynona Luna     Past Surgical History  Procedure Laterality Date  . Ovary surgery      right ovary & tube replacement  . Wrist ganglion excision      rt  . Appendectomy    . Breast cyst excision      right breast, twice    There were no vitals filed for this visit.      Subjective Assessment - 06/12/15 1237    Subjective No new complaints. No falls or pain to report.    Pertinent History HTN, HOH, LBP,  arthritis, B cataracts   Patient Stated Goals To be able to walk better and strengthen legs.   Currently in Pain? No/denies   Pain Score 0-No pain            [facility] Adult PT Treatment/Exercise - 06/12/15 1249    Ambulation/Gait   Ambulation/Gait Yes   Ambulation/Gait Assistance 5: Supervision   Ambulation/Gait Assistance Details no instability noted. pt with improved posture and foot clearance. Did report some right hip pain/soreness after gait which decreased with rest.   Ambulation Distance (Feet) 500 Feet   Assistive device None   Gait Pattern Step-through pattern;Decreased stride length  occasional toe scuffing noted.   Ambulation Surface Level;Unlevel;Indoor;Paved;Outdoor   Knee/Hip Exercises: Standing   Heel Raises Both;10 reps;5 seconds;Limitations   Heel Raises Limitations heel raises with 3# ankle weights, light UE support on chair back for balance.   Hip Abduction AROM;Stengthening;1 set;10 reps;Knee straight;Limitations   Abduction Limitations 3# ankle weights, alternating legs with light UE support on back of chair.    Knee/Hip Exercises: Seated   Long Arc Quad Strengthening;Both;Weights;1 set;15 reps;Limitations   Long Arc Quad Weight 3 lbs.   Long [employer] Limitations alternating legs. cues on controlled moitions especially with return of foot to floor.  Marching Strengthening;Both;1 set;10 reps;Limitations;Weights   Marching Limitations 5 sec hold each, alternating legs. cues on upright posture (to not lean back).   Marching Weights 3 lbs.   Knee/Hip Exercises: Supine   Bridges AROM;Strengthening;Both;10 reps;Limitations;2 sets   Bridges Limitations 5 sec holds   Straight Leg Raises AROM;Strengthening;Both;10 reps;1 set   Straight Leg Raises Limitations 3# ankle weight. 2-3 second hold  x10 reps each side.             PT Education - 06/12/15 1907    Education provided Yes   Education Details Gym program instructions to initiate before next session (pt  already a member of [employer])   Person(s) Educated Patient   Methods Explanation;Handout;Verbal cues   Comprehension Verbalized understanding;Verbal cues required;Need further instruction;Returned demonstration          PT Short Term Goals - 05/24/15 1042    PT SHORT TERM GOAL #1   Title same as LTGs           PT Long Term Goals - 06/04/15 0842    PT LONG TERM GOAL #1   Title Pt will be IND in HEP to improve balance and strength. Target date: 06/21/15   Status On-going   PT LONG TERM GOAL #2   Title Pt will verbalize plans to join community fitness center to maintain gains made during PT. Target date: 06/21/15   Status On-going   PT LONG TERM GOAL #3   Title Pt will amb. 500' over even/uneven terrain IND to improve functional mobility. Target date: 06/21/15   Status On-going   PT LONG TERM GOAL #4   Title Pt will traverse 8 steps with 1 handrail at MOD I level to improve functional mobility. Target date: 06/21/15   Baseline Pt amb. with S and 1 handrail.   Status Revised   PT LONG TERM GOAL #5   Title Pt will improve 6MWT distance to 1223' to improve endurance. Target date: 06/21/15   Baseline 1023' on 06/03/15   Status Revised   PT LONG TERM GOAL #6   Title Pt will improve BERG score to >/=48/56 to decr. falls risk. Target date: 06/21/15   Status On-going   PT LONG TERM GOAL #7   Title Pt will improve gait speed without AD to >/=2.47ft/sec to safely amb. in the community. Target date: 06/21/15   Status On-going             Plan - 06/12/15 1239    Clinical Impression Statement Educated pt on safe machines/exercises for [employer] program and provided written information today after discussion. Pt is a [employer] and was planning to wait until after PT was done to start. Educated pt it's best to start now (before the end of PT) so we can assist/address any issues she has. Pt verbalized understanding and is planning to go tomorrow before her appointment on  Friday. Remainder of today's session addressed gait and strengthening working toward [place].                                            Rehab Potential Good   Clinical Impairments Affecting Rehab Potential co-morbidities   PT Frequency 2x / week   PT Duration 4 weeks   PT Treatment/Interventions ADLs/Self Care Home Management;Biofeedback;Balance training;Therapeutic exercise;Manual techniques;Vestibular;Therapeutic activities;Functional mobility training;Stair training;Gait training;Patient/family education;Orthotic Fit/Training;DME Instruction;Neuromuscular re-education   PT  Next Visit Plan ask about gym workout and address any quesitons/issues; continue to work on strengthening, gait and balance   Consulted and Agree with Plan of Care Patient      Patient will benefit from skilled therapeutic intervention in order to improve the following deficits and impairments:  Abnormal gait, Decreased endurance, Pain, Postural dysfunction, Impaired flexibility, Decreased balance, Decreased mobility, Decreased knowledge of use of DME, Decreased strength (PT will monitor pain but will not directly address.)  Visit Diagnosis: Other abnormalities of gait and mobility  Muscle weakness (generalized)     Problem List Patient Active Problem List   Diagnosis Date Noted  . Left shoulder pain 01/29/2015  . Breast cancer screening 09/02/2014  . Decreased visual acuity 09/02/2014  . Hearing loss 09/02/2014  . Hip pain 09/02/2014  . Medicare annual wellness visit, subsequent 09/02/2014  . Osteopenia 08/17/2014  . Right knee pain 09/24/2013  . Low back pain 12/03/2012  . Skin lesion 07/10/2012  . Urinary incontinence 07/10/2012  . Cervical cancer screening 07/07/2012  . Cough 04/01/2012  . Hoarseness of voice 03/13/2012  . Cervical radiculopathy 12/03/2011  . GERD 09/13/2009  . BREAST MASS, LEFT 02/22/2009  . HIP PAIN, RIGHT, CHRONIC 12/21/2008  . 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 Hyperlipidemia, mixed  10/28/2007  . INSOMNIA 10/28/2007  . Allergic rhinitis 07/01/2007  . WRIST PAIN, RIGHT 03/21/2007  . Muscle spasm of left lower extremity 03/21/2007  . Essential hypertension 08/06/2006  . 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

## 2015-06-14 ENCOUNTER — Ambulatory Visit: Payer: Medicare Other | Attending: Family Medicine

## 2015-06-14 DIAGNOSIS — R2689 Other abnormalities of gait and mobility: Secondary | ICD-10-CM | POA: Diagnosis present

## 2015-06-14 DIAGNOSIS — M6281 Muscle weakness (generalized): Secondary | ICD-10-CM | POA: Diagnosis not present

## 2015-06-14 NOTE — Therapy (Signed)
[facility] [address] [place], [place], [address] Phone: [phone]   Fax:  [phone]  Physical Therapy Treatment  Patient Details  Name: Terri Mckee MRN: [id number] Date of Birth: [birth year] Referring Provider: Dr. Penni Homans  Encounter Date: 06/14/2015      PT End of Session - 06/14/15 0920    Visit Number 7   Number of Visits 9   Date for PT Re-Evaluation 06/23/15   Authorization Type BCBS Medicare, G-code and progress note every 10th visit.   PT Start Time 0848   PT Stop Time 0927   PT Time Calculation (min) 39 min   Activity Tolerance Patient tolerated treatment well   Behavior During Therapy [facility] for tasks assessed/performed      Past Medical History  Diagnosis Date  . Hypertension   . 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 History of cyst of breast   . 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 Colon polyps   . Elevated serum creatinine     with ACE inhibitor (Normal MRA of renal arteries)  . Arthritis     back  . GERD (gastroesophageal reflux disease)   . Cataract   . Hoarseness of voice 03/13/2012  . Cervical cancer screening 07/07/2012  . Skin lesion 07/10/2012  . Urinary incontinence 07/10/2012  . Acute upper respiratory infection 04/09/2014  . Osteopenia 08/17/2014  . Medicare annual wellness visit, subsequent 09/02/2014  . Muscle spasm of left lower extremity 03/21/2007    Qualifier: Diagnosis of  By: Wynona Luna     Past Surgical History  Procedure Laterality Date  . Ovary surgery      right ovary & tube replacement  . Wrist ganglion excision      rt  . Appendectomy    . Breast cyst excision      right breast, twice    There were no vitals filed for this visit.      Subjective Assessment - 06/14/15 0849    Subjective Pt reported she went to the gym yesterday but only walked the track, as the personal trainer  was not available until later in the day. Pt has to work today, so she's not able to go to gym but will try another date.   Pertinent History HTN, HOH, LBP, arthritis, B cataracts   Patient Stated Goals To be able to walk better and strengthen legs.   Currently in Pain? No/denies                         [facility] Adult PT Treatment/Exercise - 06/14/15 [id number]    Ambulation/Gait   Ambulation/Gait Yes   Exercises   Exercises Knee/Hip   Knee/Hip Exercises: Machines for Strengthening   Cybex Leg Press 50lb. 3x10reps with cues for technique and to improve eccentric control.   Knee/Hip Exercises: Standing   Other Standing Knee Exercises In // bars with 1-2 UE support pt performed mini squat with sidesteppin 4x7' and walking backwards to engage glutes and post. musculature 4x7'. Cues for technique.   Knee/Hip Exercises: Supine   Bridges Strengthening;Both;3 sets;10 reps   Bridges Limitations Pt performed with B LEs on physioball with cues to engage abdominal muscles and cues for technique and to place equal wt. through B LEs vs. L LE only.   Other Supine Knee/Hip Exercises With green physioball pt  performed double knee to chest. 3x10 reps, cues for technique.      Pt denied pain or discomfort during or at end of session.          PT Education - 06/14/15 0919    Education provided Yes   Education Details PT educated pt on trying to go back to the gym prior to next session. PT also discussed potential d/c next week.   Person(s) Educated Patient   Methods Explanation   Comprehension Verbalized understanding          PT Short Term Goals - 05/24/15 1042    PT SHORT TERM GOAL #1   Title same as LTGs           PT Long Term Goals - 06/04/15 0842    PT LONG TERM GOAL #1   Title Pt will be IND in HEP to improve balance and strength. Target date: 06/21/15   Status On-going   PT LONG TERM GOAL #2   Title Pt will verbalize plans to join community fitness center to maintain  gains made during PT. Target date: 06/21/15   Status On-going   PT LONG TERM GOAL #3   Title Pt will amb. 500' over even/uneven terrain IND to improve functional mobility. Target date: 06/21/15   Status On-going   PT LONG TERM GOAL #4   Title Pt will traverse 8 steps with 1 handrail at MOD I level to improve functional mobility. Target date: 06/21/15   Baseline Pt amb. with S and 1 handrail.   Status Revised   PT LONG TERM GOAL #5   Title Pt will improve 6MWT distance to 1223' to improve endurance. Target date: 06/21/15   Baseline 1023' on 06/03/15   Status Revised   PT LONG TERM GOAL #6   Title Pt will improve BERG score to >/=48/56 to decr. falls risk. Target date: 06/21/15   Status On-going   PT LONG TERM GOAL #7   Title Pt will improve gait speed without AD to >/=2.76ft/sec to safely amb. in the community. Target date: 06/21/15   Status On-going               Plan - 06/14/15 [id number]    Clinical Impression Statement Pt demonstrated progress, as she tolerated machine and physioball resistance training. Pt continues to require rest breaks 2/2 fatigue and would benefit from continued endurance training. Continue with POC>   Rehab Potential Good   Clinical Impairments Affecting Rehab Potential co-morbidities   PT Frequency 2x / week   PT Duration 4 weeks   PT Treatment/Interventions ADLs/Self Care Home Management;Biofeedback;Balance training;Therapeutic exercise;Manual techniques;Vestibular;Therapeutic activities;Functional mobility training;Stair training;Gait training;Patient/family education;Orthotic Fit/Training;DME Instruction;Neuromuscular re-education   PT Next Visit Plan ask about gym workout and address any quesitons/issues; continue to work on strengthening, gait and balance, and assess goals. (trial SciFit for endurance with B LEs only).   Consulted and Agree with Plan of Care Patient      Patient will benefit from skilled therapeutic intervention in order to improve the following  deficits and impairments:  Abnormal gait, Decreased endurance, Pain, Postural dysfunction, Impaired flexibility, Decreased balance, Decreased mobility, Decreased knowledge of use of DME, Decreased strength (PT will monitor pain but will not directly address.)  Visit Diagnosis: Muscle weakness (generalized)  Other abnormalities of gait and mobility     Problem List Patient Active Problem List   Diagnosis Date Noted  . Left shoulder pain 01/29/2015  . Breast cancer screening 09/02/2014  . Decreased visual acuity  09/02/2014  . Hearing loss 09/02/2014  . Hip pain 09/02/2014  . Medicare annual wellness visit, subsequent 09/02/2014  . Osteopenia 08/17/2014  . Right knee pain 09/24/2013  . Low back pain 12/03/2012  . Skin lesion 07/10/2012  . Urinary incontinence 07/10/2012  . Cervical cancer screening 07/07/2012  . Cough 04/01/2012  . Hoarseness of voice 03/13/2012  . Cervical radiculopathy 12/03/2011  . GERD 09/13/2009  . BREAST MASS, LEFT 02/22/2009  . HIP PAIN, RIGHT, CHRONIC 12/21/2008  . 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 Hyperlipidemia, mixed 10/28/2007  . INSOMNIA 10/28/2007  . Allergic rhinitis 07/01/2007  . WRIST PAIN, RIGHT 03/21/2007  . Muscle spasm of left lower extremity 03/21/2007  . Essential hypertension 08/06/2006  . 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

## 2015-06-18 ENCOUNTER — Encounter: Payer: Self-pay | Admitting: Physical Therapy

## 2015-06-18 ENCOUNTER — Ambulatory Visit: Payer: Medicare Other | Admitting: Physical Therapy

## 2015-06-18 DIAGNOSIS — R2689 Other abnormalities of gait and mobility: Secondary | ICD-10-CM

## 2015-06-18 DIAGNOSIS — M6281 Muscle weakness (generalized): Secondary | ICD-10-CM

## 2015-06-18 NOTE — Therapy (Signed)
[facility] [address] [place], [place], [address] Phone: [phone]   Fax:  [phone]  Physical Therapy Treatment  Patient Details  Name: Terri Mckee MRN: [id number] Date of Birth: [birth year] Referring Provider: Dr. Penni Homans  Encounter Date: 06/18/2015      PT End of Session - 06/18/15 0938    Visit Number 8   Number of Visits 9   Date for PT Re-Evaluation 06/23/15   Authorization Type BCBS Medicare, G-code and progress note every 10th visit.   PT Start Time [id number]   PT Stop Time 1010   PT Time Calculation (min) 39 min   Activity Tolerance Patient tolerated treatment well   Behavior During Therapy WFL for tasks assessed/performed      Past Medical History  Diagnosis Date  . Hypertension   . 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 History of cyst of breast   . 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 Colon polyps   . Elevated serum creatinine     with ACE inhibitor (Normal MRA of renal arteries)  . Arthritis     back  . GERD (gastroesophageal reflux disease)   . Cataract   . Hoarseness of voice 03/13/2012  . Cervical cancer screening 07/07/2012  . Skin lesion 07/10/2012  . Urinary incontinence 07/10/2012  . Acute upper respiratory infection 04/09/2014  . Osteopenia 08/17/2014  . Medicare annual wellness visit, subsequent 09/02/2014  . Muscle spasm of left lower extremity 03/21/2007    Qualifier: Diagnosis of  By: Wynona Luna     Past Surgical History  Procedure Laterality Date  . Ovary surgery      right ovary & tube replacement  . Wrist ganglion excision      rt  . Appendectomy    . Breast cyst excision      right breast, twice    There were no vitals filed for this visit.      Subjective Assessment - 06/18/15 0936    Subjective Has not had a chance to use equipment at gym as she keeps getting called into work. Still  working on trying to get there. Reports no falls or pain today.   Pertinent History HTN, HOH, LBP, arthritis, B cataracts   Patient Stated Goals To be able to walk better and strengthen legs.   Currently in Pain? No/denies   Pain Score 0-No pain            [facility] PT Assessment - 06/18/15 0939    6 Minute Walk- Baseline   6 Minute Walk- Baseline yes   BP (mmHg) 119/87 mmHg   HR (bpm) 72   02 Sat (%RA) 97 %   Modified Borg Scale for Dyspnea 0- Nothing at all   Perceived Rate of Exertion (Borg) 6-   6 Minute walk- Post Test   6 Minute Walk Post Test yes   BP (mmHg) 129/79 mmHg   HR (bpm) 87   02 Sat (%RA) 99 %   Modified Borg Scale for Dyspnea 0.5- Very, very slight shortness of breath   Perceived Rate of Exertion (Borg) 13- Somewhat hard   6 minute walk test results    Aerobic Endurance Distance Walked 1109   Endurance additional comments no rest breaks taken with testing. no dizziness reported. did have bil hip soreness/pain toward end with slight imbalance noted during last minute  and half.             [facility] Adult PT Treatment/Exercise - 06/18/15 0956    Transfers   Transfers Sit to Stand;Stand to Sit   Sit to Stand 6: Modified independent (Device/Increase time);With upper extremity assist;Without upper extremity assist;From bed;From chair/3-in-1   Stand to Sit 6: Modified independent (Device/Increase time);With upper extremity assist;Without upper extremity assist;To bed;To chair/3-in-1   Ambulation/Gait   Ambulation/Gait Yes   Ambulation/Gait Assistance 6: Modified independent (Device/Increase time)   Ambulation/Gait Assistance Details no balance issues or instability noted with gait today   Ambulation Distance (Feet) 500 Feet  x1, plus 6 minute walk test distance   Assistive device None   Gait Pattern Step-through pattern;Decreased stride length   Ambulation Surface Level;Unlevel;Indoor;Paved;Outdoor   Gait velocity 10.06 sec's= 3.56 ft/sec   Stairs Yes   Stairs  Assistance 6: Modified independent (Device/Increase time)   Stairs Assistance Details (indicate cue type and reason) no imbalance noted, no cues or assist needed. increased time, mostly with desending needed.    Stair Management Technique One rail Right;Alternating pattern;Forwards   Number of Stairs 8                  PT Short Term Goals - 05/24/15 1042    PT SHORT TERM GOAL #1   Title same as LTGs           PT Long Term Goals - 06/18/15 1003    PT LONG TERM GOAL #1   Title Pt will be IND in HEP to improve balance and strength. Target date: 06/21/15   Status On-going   PT LONG TERM GOAL #2   Title Pt will verbalize plans to join community fitness center to maintain gains made during PT. Target date: 06/21/15   Baseline met on 06/18/15: pt is member of gym, has gone once and walked track, working towards equipment, has been busy with work   Status Achieved   PT [place] #3   Title Pt will amb. 500' over even/uneven terrain IND to improve functional mobility. Target date: 06/21/15   Baseline met on 06/18/15   Status Achieved   PT LONG TERM GOAL #4   Title Pt will traverse 8 steps with 1 handrail at MOD I level to improve functional mobility. Target date: 06/21/15   Baseline met on 06/18/15   Status Achieved   PT LONG TERM GOAL #5   Title Pt will improve 6MWT distance to 1223' to improve endurance. Target date: 06/21/15   Baseline 06/18/15: 1109 today (improved from initial testing, not to goal)   Status Not Met   PT LONG TERM GOAL #6   Title Pt will improve BERG score to >/=48/56 to decr. falls risk. Target date: 06/21/15   Status On-going   PT LONG TERM GOAL #7   Title Pt will improve gait speed without AD to >/=2.44f/sec to safely amb. in the community. Target date: 06/21/15   Baseline 06/18/15: 3.56 ft/sec with no AD   Status Achieved            Plan - 06/18/15 0938    Clinical Impression Statement Pt has met 4 of 6 LTGs to date. She is progressing toward all unmet goals  as well.    Rehab Potential Good   Clinical Impairments Affecting Rehab Potential co-morbidities   PT Frequency 2x / week   PT Duration 4 weeks   PT Treatment/Interventions ADLs/Self Care Home Management;Biofeedback;Balance training;Therapeutic exercise;Manual techniques;Vestibular;Therapeutic activities;Functional mobility training;Stair  training;Gait training;Patient/family education;Orthotic Fit/Training;DME Instruction;Neuromuscular re-education   PT Next Visit Plan ask about gym workout and address any quesitons/issues; check remaining goals. trial Scifit legs only for strengthening and activity tolerance.   Consulted and Agree with Plan of Care Patient      Patient will benefit from skilled therapeutic intervention in order to improve the following deficits and impairments:  Abnormal gait, Decreased endurance, Pain, Postural dysfunction, Impaired flexibility, Decreased balance, Decreased mobility, Decreased knowledge of use of DME, Decreased strength (PT will monitor pain but will not directly address.)  Visit Diagnosis: Muscle weakness (generalized)  Other abnormalities of gait and mobility     Problem List Patient Active Problem List   Diagnosis Date Noted  . Left shoulder pain 01/29/2015  . Breast cancer screening 09/02/2014  . Decreased visual acuity 09/02/2014  . Hearing loss 09/02/2014  . Hip pain 09/02/2014  . Medicare annual wellness visit, subsequent 09/02/2014  . Osteopenia 08/17/2014  . Right knee pain 09/24/2013  . Low back pain 12/03/2012  . Skin lesion 07/10/2012  . Urinary incontinence 07/10/2012  . Cervical cancer screening 07/07/2012  . Cough 04/01/2012  . Hoarseness of voice 03/13/2012  . Cervical radiculopathy 12/03/2011  . GERD 09/13/2009  . BREAST MASS, LEFT 02/22/2009  . HIP PAIN, RIGHT, CHRONIC 12/21/2008  . 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 Hyperlipidemia, mixed 10/28/2007  . INSOMNIA 10/28/2007  . Allergic rhinitis 07/01/2007  . WRIST PAIN, RIGHT  03/21/2007  . Muscle spasm of left lower extremity 03/21/2007  . Essential hypertension 08/06/2006  . 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

## 2015-06-21 ENCOUNTER — Ambulatory Visit: Payer: Medicare Other

## 2015-06-21 DIAGNOSIS — R2689 Other abnormalities of gait and mobility: Secondary | ICD-10-CM

## 2015-06-21 DIAGNOSIS — M6281 Muscle weakness (generalized): Secondary | ICD-10-CM | POA: Diagnosis not present

## 2015-06-21 NOTE — Therapy (Signed)
[facility] [address] [place], [place], [address] Phone: [phone]   Fax:  [phone]  Physical Therapy Treatment  Patient Details  Name: Terri Mckee MRN: [id number] Date of Birth: 10/03/1940 Referring Provider: Dr. Penni Homans  Encounter Date: 06/21/2015      PT End of Session - 06/21/15 1211    Visit Number 9   Number of Visits 9   Date for PT Re-Evaluation 06/23/15   Authorization Type BCBS Medicare, G-code and progress note every 10th visit.   PT Start Time 1149  pt late   PT Stop Time 1206  d/c   PT Time Calculation (min) 17 min   Equipment Utilized During Treatment Gait belt   Activity Tolerance Patient tolerated treatment well   Behavior During Therapy WFL for tasks assessed/performed      Past Medical History  Diagnosis Date  . Hypertension   . 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 History of cyst of breast   . 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 Colon polyps   . Elevated serum creatinine     with ACE inhibitor (Normal MRA of renal arteries)  . Arthritis     back  . GERD (gastroesophageal reflux disease)   . Cataract   . Hoarseness of voice 03/13/2012  . Cervical cancer screening 07/07/2012  . Skin lesion 07/10/2012  . Urinary incontinence 07/10/2012  . Acute upper respiratory infection 04/09/2014  . Osteopenia 08/17/2014  . Medicare annual wellness visit, subsequent 09/02/2014  . Muscle spasm of left lower extremity 03/21/2007    Qualifier: Diagnosis of  By: Wynona Luna     Past Surgical History  Procedure Laterality Date  . Ovary surgery      right ovary & tube replacement  . Wrist ganglion excision      rt  . Appendectomy    . Breast cyst excision      right breast, twice    There were no vitals filed for this visit.      Subjective Assessment - 06/21/15 1151    Subjective Pt denied changes or falls  since last visit. Pt unable to get to gym due to work schedule. Pt states she feels much better than when she first started PT. She stated "I was so weak but I feel stronger."   Pertinent History HTN, HOH, LBP, arthritis, B cataracts   Patient Stated Goals To be able to walk better and strengthen legs.   Currently in Pain? No/denies                         [facility] Adult PT Treatment/Exercise - 06/21/15 1152    Standardized Balance Assessment   Standardized Balance Assessment Berg Balance Test   Berg Balance Test   Sit to Stand Able to stand without using hands and stabilize independently   Standing Unsupported Able to stand safely 2 minutes   Sitting with Back Unsupported but Feet Supported on Floor or Stool Able to sit safely and securely 2 minutes   Stand to Sit Sits safely with minimal use of hands   Transfers Able to transfer safely, minor use of hands   Standing Unsupported with Eyes Closed Able to stand 10 seconds safely   Standing Ubsupported with Feet Together Able to place feet together independently and stand 1 minute safely  From Standing, Reach Forward with Outstretched Arm Can reach confidently >25 cm (10")   From Standing Position, Pick up Object from [place] to pick up shoe safely and easily   From Standing Position, Turn to Look Behind Over each Shoulder Looks behind from both sides and weight shifts well   Turn 360 Degrees Able to turn 360 degrees safely in 4 seconds or less   Standing Unsupported, Alternately Place Feet on Step/Stool Able to stand independently and safely and complete 8 steps in 20 seconds   Standing Unsupported, One Foot in Front Able to plae foot ahead of the other independently and hold 30 seconds   Standing on One Leg Able to lift leg independently and hold 5-10 seconds  5 sec.   Total Score 54           Self care:     PT Education - 06/21/15 1210    Education provided Yes   Education Details PT reviewed strengthening  HEP and gym instructions, pt verbalized understanding and stated she will go to gym as soon as she has time off from work. PT discussed goal progress and d/c, pt agreeable. Pt stated she did not need to trial [employer], as she plans to go to the gym and will ask trainer at gym if she has questions regarding their bike.   Person(s) Educated Patient   Methods Explanation   Comprehension Verbalized understanding          PT Short Term Goals - 05/24/15 1042    PT SHORT TERM GOAL #1   Title same as LTGs           PT Long Term Goals - 06/21/15 1213    PT LONG TERM GOAL #1   Title Pt will be IND in HEP to improve balance and strength. Target date: 06/21/15   Status Achieved   PT LONG TERM GOAL #2   Title Pt will verbalize plans to join community fitness center to maintain gains made during PT. Target date: 06/21/15   Baseline met on 06/18/15: pt is member of gym, has gone once and walked track, working towards equipment, has been busy with work   Status Achieved   PT [place] #3   Title Pt will amb. 500' over even/uneven terrain IND to improve functional mobility. Target date: 06/21/15   Baseline met on 06/18/15   Status Achieved   PT LONG TERM GOAL #4   Title Pt will traverse 8 steps with 1 handrail at MOD I level to improve functional mobility. Target date: 06/21/15   Baseline met on 06/18/15   Status Achieved   PT LONG TERM GOAL #5   Title Pt will improve 6MWT distance to 1223' to improve endurance. Target date: 06/21/15   Baseline 06/18/15: 1109 today (improved from initial testing, not to goal)   Status Not Met   PT LONG TERM GOAL #6   Title Pt will improve BERG score to >/=48/56 to decr. falls risk. Target date: 06/21/15   Status Achieved   PT LONG TERM GOAL #7   Title Pt will improve gait speed without AD to >/=2.36f/sec to safely amb. in the community. Target date: 06/21/15   Baseline 06/18/15: 3.56 ft/sec with no AD   Status Achieved               Plan - 06/21/15 1211     Clinical Impression Statement Pt met LTGs 1 (HEP) and 6 (BERG). Pt did not meet 6MWT goal,  but did improve distance although it was not a significant change. Pt d/c today based on great progress, please see pt d/c summary for details.      Patient will benefit from skilled therapeutic intervention in order to improve the following deficits and impairments:     Visit Diagnosis: Other abnormalities of gait and mobility  Muscle weakness (generalized)       G-Codes - 2015-06-27 1213    Functional Assessment Tool Used BERG: 54/56 and gait speed: 3.77f/sec.   Functional Limitation Mobility: Walking and moving around   Mobility: Walking and Moving Around Goal Status ([phone] At least 1 percent but less than 20 percent impaired, limited or restricted   Mobility: Walking and Moving Around Discharge Status [phone] At least 1 percent but less than 20 percent impaired, limited or restricted      Problem List Patient Active Problem List   Diagnosis Date Noted  . Left shoulder pain 01/29/2015  . Breast cancer screening 09/02/2014  . Decreased visual acuity 09/02/2014  . Hearing loss 09/02/2014  . Hip pain 09/02/2014  . Medicare annual wellness visit, subsequent 09/02/2014  . Osteopenia 08/17/2014  . Right knee pain 09/24/2013  . Low back pain 12/03/2012  . Skin lesion 07/10/2012  . Urinary incontinence 07/10/2012  . Cervical cancer screening 07/07/2012  . Cough 04/01/2012  . Hoarseness of voice 03/13/2012  . Cervical radiculopathy 12/03/2011  . GERD 09/13/2009  . BREAST MASS, LEFT 02/22/2009  . HIP PAIN, RIGHT, CHRONIC 12/21/2008  . 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 Hyperlipidemia, mixed 10/28/2007  . INSOMNIA 10/28/2007  . Allergic rhinitis 07/01/2007  . WRIST PAIN, RIGHT 03/21/2007  . Muscle spasm of left lower extremity 03/21/2007  . Essential hypertension 08/06/2006  . HORSESHOE KIDNEY 08/06/2006    Brezlyn Manrique L 62017/06/15 12:16 PM  [facility] [address]Phone: [phone]  Fax:  [phone] Name: DMACIE BAUMMRN: [id number] of Birth: [birth year] PHYSICAL THERAPY DISCHARGE SUMMARY  Visits from Start of Care: 9  Current functional level related to goals / functional outcomes:     PT Long Term Goals - 0Jun 15, 20171213    PT LONG TERM GOAL #1   Title Pt will be IND in HEP to improve balance and strength. Target date: 6June 15, 2017  Status Achieved   PT LONG TERM GOAL #2   Title Pt will verbalize plans to join community fitness center to maintain gains made during PT. Target date: 606-15-17  Baseline met on 06/18/15: pt is member of gym, has gone once and walked track, working towards equipment, has been busy with work   Status Achieved   PT [place]3   Title Pt will amb. 500' over even/uneven terrain IND to improve functional mobility. Target date: 606/15/17  Baseline met on 06/18/15   Status Achieved   PT LONG TERM GOAL #4   Title Pt will traverse 8 steps with 1 handrail at MOD I level to improve functional mobility. Target date: 6Jun 15, 2017  Baseline met on 06/18/15   Status Achieved   PT LONG TERM GOAL #5   Title Pt will improve 6MWT distance to 1223' to improve endurance. Target date: 62017-06-15  Baseline 06/18/15: 1109 today (improved from initial testing, not to goal)   Status Not Met   PT LONG TERM GOAL #6   Title Pt will improve BERG score to >/=48/56 to decr. falls risk. Target date: 606/15/17  Status Achieved   PT LONG TERM GOAL #7   Title Pt will improve gait speed without AD to >/=2.63f/sec to safely amb. in the community. Target date: 06/21/15   Baseline 06/18/15: 3.56 ft/sec with no AD   Status Achieved        Remaining deficits: Decreased endurance, which should improve once pt begins gym program as instructed.   Education / Equipment: HEP  Plan: Patient agrees to discharge.  Patient goals were met. Patient is being discharged due to  meeting the stated rehab goals.  ?????       JGeoffry Paradise PT,DPT 06/21/2015 12:16 PM Phone: [phone]: [phone]

## 2015-06-25 ENCOUNTER — Ambulatory Visit: Payer: Medicare Other | Admitting: Physical Therapy

## 2015-06-28 ENCOUNTER — Ambulatory Visit: Payer: Medicare Other

## 2015-07-17 ENCOUNTER — Other Ambulatory Visit (INDEPENDENT_AMBULATORY_CARE_PROVIDER_SITE_OTHER): Payer: Medicare Other

## 2015-07-17 DIAGNOSIS — E782 Mixed hyperlipidemia: Secondary | ICD-10-CM

## 2015-07-17 DIAGNOSIS — M6281 Muscle weakness (generalized): Secondary | ICD-10-CM

## 2015-07-17 DIAGNOSIS — I10 Essential (primary) hypertension: Secondary | ICD-10-CM

## 2015-07-17 DIAGNOSIS — R5382 Chronic fatigue, unspecified: Secondary | ICD-10-CM | POA: Diagnosis not present

## 2015-07-17 DIAGNOSIS — M791 Myalgia: Secondary | ICD-10-CM

## 2015-07-17 DIAGNOSIS — M8580 Other specified disorders of bone density and structure, unspecified site: Secondary | ICD-10-CM | POA: Diagnosis not present

## 2015-07-17 DIAGNOSIS — M7918 Myalgia, other site: Secondary | ICD-10-CM

## 2015-07-17 LAB — COMPREHENSIVE METABOLIC PANEL
ALT: 15 U/L (ref 0–35)
AST: 14 U/L (ref 0–37)
Albumin: 3.7 g/dL (ref 3.5–5.2)
Alkaline Phosphatase: 51 U/L (ref 39–117)
BILIRUBIN TOTAL: 0.3 mg/dL (ref 0.2–1.2)
BUN: 20 mg/dL (ref 6–23)
CO2: 29 meq/L (ref 19–32)
CREATININE: 1.12 mg/dL (ref 0.40–1.20)
Calcium: 8.8 mg/dL (ref 8.4–10.5)
Chloride: 109 mEq/L (ref 96–112)
GFR: 60.92 mL/min (ref >60.00)
GLUCOSE: 98 mg/dL (ref 70–99)
Potassium: 3.7 mEq/L (ref 3.5–5.1)
Sodium: 142 mEq/L (ref 135–145)
Total Protein: 7 g/dL (ref 6.0–8.3)

## 2015-07-17 LAB — CBC
HCT: 38.4 % (ref 36.0–46.0)
Hemoglobin: 12.8 g/dL (ref 12.0–15.0)
MCHC: 33.3 g/dL (ref 30.0–36.0)
MCV: 85.3 fl (ref 78.0–100.0)
Platelets: 221 10*3/uL (ref 150.0–400.0)
RBC: 4.5 Mil/uL (ref 3.87–5.11)
RDW: 14.3 % (ref 11.5–15.5)
WBC: 5.2 10*3/uL (ref 4.0–10.5)

## 2015-07-17 LAB — LIPID PANEL
CHOL/HDL RATIO: 5
Cholesterol: 177 mg/dL (ref 0–200)
HDL: 36.5 mg/dL — ABNORMAL LOW (ref >39.00)
LDL CALC: 111 mg/dL — AB (ref 0–99)
NonHDL: 140.58
Triglycerides: 148 mg/dL (ref 0.0–149.0)
VLDL: 29.6 mg/dL (ref 0.0–40.0)

## 2015-07-17 LAB — TSH: TSH: 3.38 u[IU]/mL (ref 0.35–4.50)

## 2015-07-19 ENCOUNTER — Ambulatory Visit: Payer: Medicare Other | Admitting: Family Medicine

## 2015-07-19 LAB — VITAMIN D 1,25 DIHYDROXY
Vitamin D 1, 25 (OH)2 Total: 60 pg/mL (ref 18–72)
Vitamin D2 1, 25 (OH)2: 14 pg/mL
Vitamin D3 1, 25 (OH)2: 46 pg/mL

## 2015-07-25 ENCOUNTER — Ambulatory Visit (HOSPITAL_BASED_OUTPATIENT_CLINIC_OR_DEPARTMENT_OTHER)
Admission: RE | Admit: 2015-07-25 | Discharge: 2015-07-25 | Disposition: A | Discharge status: Home / Self Care | Payer: Medicare Other | Source: Ambulatory Visit | Attending: Family Medicine | Admitting: Family Medicine

## 2015-07-25 ENCOUNTER — Ambulatory Visit (INDEPENDENT_AMBULATORY_CARE_PROVIDER_SITE_OTHER): Payer: Medicare Other | Admitting: Family Medicine

## 2015-07-25 ENCOUNTER — Telehealth: Payer: Self-pay | Admitting: Family Medicine

## 2015-07-25 ENCOUNTER — Encounter: Payer: Self-pay | Admitting: Family Medicine

## 2015-07-25 VITALS — BP 110/72 | HR 69 | Temp 97.9°F | Ht 70.0 in | Wt 224.2 lb

## 2015-07-25 DIAGNOSIS — M12851 Other specific arthropathies, not elsewhere classified, right hip: Secondary | ICD-10-CM | POA: Diagnosis not present

## 2015-07-25 DIAGNOSIS — M25561 Pain in right knee: Secondary | ICD-10-CM | POA: Diagnosis not present

## 2015-07-25 DIAGNOSIS — H269 Unspecified cataract: Secondary | ICD-10-CM | POA: Diagnosis not present

## 2015-07-25 DIAGNOSIS — M8580 Other specified disorders of bone density and structure, unspecified site: Secondary | ICD-10-CM

## 2015-07-25 DIAGNOSIS — M25551 Pain in right hip: Secondary | ICD-10-CM

## 2015-07-25 DIAGNOSIS — I10 Essential (primary) hypertension: Secondary | ICD-10-CM

## 2015-07-25 DIAGNOSIS — M179 Osteoarthritis of knee, unspecified: Secondary | ICD-10-CM | POA: Diagnosis not present

## 2015-07-25 DIAGNOSIS — Z23 Encounter for immunization: Secondary | ICD-10-CM

## 2015-07-25 DIAGNOSIS — K219 Gastro-esophageal reflux disease without esophagitis: Secondary | ICD-10-CM

## 2015-07-25 DIAGNOSIS — E782 Mixed hyperlipidemia: Secondary | ICD-10-CM

## 2015-07-25 HISTORY — DX: Unspecified cataract: H26.9

## 2015-07-25 MED ORDER — ZOSTER VACCINE LIVE 19400 UNT/0.65ML ~~LOC~~ SUSR
0.6500 mL | Freq: Once | SUBCUTANEOUS | Status: DC
Start: 2015-07-25 — End: 2016-02-07

## 2015-07-25 MED ORDER — RANITIDINE HCL 150 MG PO CAPS: 150.0000 mg | ORAL_CAPSULE | Freq: Every day | ORAL | Status: DC

## 2015-07-25 NOTE — Assessment & Plan Note (Signed)
Has surgery scheduled for left eye surgery in September then right will be in November with Dr Kathlen Mody

## 2015-07-25 NOTE — Telephone Encounter (Signed)
PCP informed imaging pelvis should take care of back.

## 2015-07-25 NOTE — Progress Notes (Signed)
Pre visit review using our clinic review tool, if applicable. No additional management support is needed unless otherwise documented below in the visit note. 

## 2015-07-25 NOTE — Telephone Encounter (Signed)
Caller name: Loma Sousa from [facility]  Call back number:ext [phone]   Reason for call:  Patient states she was under the impression orders were placed for x ray of her back in addition to her knee and hip. Awaiting orders, please advise

## 2015-07-25 NOTE — Patient Instructions (Addendum)
64 of clear fluids daily  Fiber supplement such as Benefiber or Metamucil up to twice daily Continue probiotic, Haakon makes a 10 strain cap, 1 cap daily can get at [employer]  Encouraged increased hydration and fiber in diet. Daily probiotics. If bowels not moving can use MOM 2 tbls po in 4 oz of warm prune juice by mouth every 2-3 days. If no results then repeat in 4 hours with  Dulcolax suppository pr, may repeat again in 4 more hours as needed. Seek care if symptoms worsen. Consider daily Miralax and/or Dulcolax if symptoms persist.   Constipation, Adult Constipation is when a person has fewer than three bowel movements a week, has difficulty having a bowel movement, or has stools that are dry, hard, or larger than normal. As people grow older, constipation is more common. A low-fiber diet, not taking in enough fluids, and taking certain medicines may make constipation worse.  CAUSES   Certain medicines, such as antidepressants, pain medicine, iron supplements, antacids, and water pills.   Certain diseases, such as diabetes, irritable bowel syndrome (IBS), thyroid disease, or depression.   Not drinking enough water.   Not eating enough fiber-rich foods.   Stress or travel.   Lack of physical activity or exercise.   Ignoring the urge to have a bowel movement.   Using laxatives too much.  SIGNS AND SYMPTOMS   Having fewer than three bowel movements a week.   Straining to have a bowel movement.   Having stools that are hard, dry, or larger than normal.   Feeling full or bloated.   Pain in the lower abdomen.   Not feeling relief after having a bowel movement.  DIAGNOSIS  Your health care provider will take a medical history and perform a physical exam. Further testing may be done for severe constipation. Some tests may include:  A barium enema X-ray to examine your rectum, colon, and, sometimes, your small intestine.   A sigmoidoscopy  to examine your lower colon.   A colonoscopy to examine your entire colon. TREATMENT  Treatment will depend on the severity of your constipation and what is causing it. Some dietary treatments include drinking more fluids and eating more fiber-rich foods. Lifestyle treatments may include regular exercise. If these diet and lifestyle recommendations do not help, your health care provider may recommend taking over-the-counter laxative medicines to help you have bowel movements. Prescription medicines may be prescribed if over-the-counter medicines do not work.  HOME CARE INSTRUCTIONS   Eat foods that have a lot of fiber, such as fruits, vegetables, whole grains, and beans.  Limit foods high in fat and processed sugars, such as french fries, hamburgers, cookies, candies, and soda.   A fiber supplement may be added to your diet if you cannot get enough fiber from foods.   Drink enough fluids to keep your urine clear or pale yellow.   Exercise regularly or as directed by your health care provider.   Go to the restroom when you have the urge to go. Do not hold it.   Only take over-the-counter or prescription medicines as directed by your health care provider. Do not take other medicines for constipation without talking to your health care provider first.  [place] IF:   You have bright red blood in your stool.   Your constipation lasts for more than 4 days or gets worse.   You have abdominal or rectal pain.   You have thin, pencil-like stools.  You have unexplained weight loss. MAKE SURE YOU:   Understand these instructions.  Will watch your condition.  Will get help right away if you are not doing well or get worse.   This information is not intended to replace advice given to you by your health care provider. Make sure you discuss any questions you have with your health care provider.   Document Released: 09/27/2003 Document Revised: 01/19/2014  Document Reviewed: 10/10/2012 Elsevier Interactive Patient Education [employer].

## 2015-07-25 NOTE — Assessment & Plan Note (Signed)
Well controlled, no changes to meds. Encouraged heart healthy diet such as the DASH diet and exercise as tolerated.  °

## 2015-07-26 ENCOUNTER — Other Ambulatory Visit: Payer: Self-pay | Admitting: Family Medicine

## 2015-07-26 DIAGNOSIS — M25551 Pain in right hip: Secondary | ICD-10-CM

## 2015-08-11 NOTE — Assessment & Plan Note (Signed)
Encouraged heart healthy diet, increase exercise, avoid trans fats, consider a krill oil cap daily 

## 2015-08-11 NOTE — Assessment & Plan Note (Signed)
B/l knee pain and hip pain noted and worsening, will refer to orthopaedics for further management after xrays. Use topical treatments and maintain activity as much as tolerated

## 2015-08-11 NOTE — Assessment & Plan Note (Signed)
Avoid offending foods, start probiotics. Do not eat large meals in late evening and consider raising head of bed.  

## 2015-08-11 NOTE — Progress Notes (Signed)
Patient ID: Terri Mckee, female   DOB: 06-27-1940, 75 y.o.   MRN: [id number]   Subjective:    Patient ID: Terri Mckee, female    DOB: 04-Apr-1940, 75 y.o.   MRN: [id number]  Chief Complaint  Patient presents with  . Follow-up    HPI Patient is in today for follow up. She mostly feels well. No recent illness or hospitalization. Her greatest concerns are her ongoing joint issues and peripheral edema. Edema is unremarkable today but comes and goes. Also has trouble with b/l knee pain and hip pain which are worsening and affecting her ability to do her daily activities. No recent trauma. No redness, warth or swelling of joints.  Past Medical History:  Diagnosis Date  . 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 Acute upper respiratory infection 04/09/2014  . Arthritis    back  . 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 Cataract   . Cataracts, bilateral 07/25/2015  . Cervical cancer screening 07/07/2012  . Colon polyps   . Elevated serum creatinine    with ACE inhibitor (Normal MRA of renal arteries)  . GERD (gastroesophageal reflux disease)   . History of cyst of breast   . Hoarseness of voice 03/13/2012  . Hypertension   . Medicare annual wellness visit, subsequent 09/02/2014  . Muscle spasm of left lower extremity 03/21/2007   Qualifier: Diagnosis of  By: Wynona Luna   . Osteopenia 08/17/2014  . Skin lesion 07/10/2012  . Urinary incontinence 07/10/2012    Past Surgical History:  Procedure Laterality Date  . APPENDECTOMY    . BREAST CYST EXCISION     right breast, twice  . OVARY SURGERY     right ovary & tube replacement  . WRIST GANGLION EXCISION     rt    Family History  Problem Relation Age of Onset  . Brain cancer Mother     deceased age 5  . Alcohol abuse Mother   . Other Brother     died of sepsis  . Gout Brother   . Colon cancer Neg Hx   . Alcohol abuse Father   . Cancer Sister     throat    . Pulmonary embolism Daughter   . Mental illness Daughter     anxiety  . Diabetes Maternal Aunt   . Diabetes Maternal Aunt   . Seizures Maternal Aunt   . Birth defects Maternal Aunt     Social History   Social History  . Marital status: Married    Spouse name: N/A  . Number of children: N/A  . Years of education: N/A   Occupational History  . Not on file.   Social History Main Topics  . Smoking status: Former Smoker    Quit date: 01/13/1991  . Smokeless tobacco: Never Used     Comment: quit 20 years ago 1/2 ppd x 40 years  . Alcohol use No  . Drug use: No  . Sexual activity: No     Comment: lives with husband, no dietary restrictions,    Other Topics Concern  . Not on file   Social History Narrative   Former Smoker - quit 20 years ago (1/2 ppd x 40 years)   Widowed   1 Daughter         Outpatient Medications Prior to Visit  Medication Sig Dispense Refill  . acetaminophen (TYLENOL) 500 MG  tablet Take 1 tablet (500 mg total) by mouth every 8 (eight) hours as needed. 90 tablet 0  . acetaminophen-codeine (TYLENOL #3) 300-30 MG tablet Take 1-2 tablets by mouth every 6 (six) hours as needed for severe pain. 15 tablet 0  . amLODipine (NORVASC) 10 MG tablet TAKE 1 TABLET (10 MG TOTAL) BY MOUTH DAILY. 90 tablet 2  . aspirin 81 MG tablet Take 81 mg by mouth daily.      . calcium carbonate (OS-CAL) 600 MG TABS Take 600 mg by mouth daily.      . cyclobenzaprine (FLEXERIL) 10 MG tablet Take 1 tablet (10 mg total) by mouth 2 (two) times daily as needed for muscle spasms. 30 tablet 1  . diclofenac sodium (VOLTAREN) 1 % GEL Apply 1 application topically 3 (three) times daily. Apply  2 grams three times a day to affected area as needed 100 g 4  . ergocalciferol (VITAMIN D2) 50000 units capsule Take 1 capsule (50,000 Units total) by mouth once a week. 4 capsule 4  . fluticasone (FLONASE) 50 MCG/ACT nasal spray Place 2 sprays into both nostrils daily. 16 g 2  . Garlic [id number] MG CAPS Take  1 capsule by mouth daily.    Marland Kitchen levocetirizine (XYZAL) 5 MG tablet Take 1 tablet (5 mg total) by mouth every evening. 90 tablet 3  . Multiple Vitamin (MULTIVITAMIN) tablet Take 1 tablet by mouth daily.    . Omega-3 Fatty Acids (FISH OIL) 1200 MG CAPS Take 1,200 mg by mouth. 2 capsules by mouth once daily in the morning     . omeprazole (PRILOSEC) 20 MG capsule TAKE 1 TABLET (20 MG TOTAL) BY MOUTH DAILY. 1 CAPSULE BY MOUTH ONCE DAILY 30 MINUTES BEFORE MEALS 30 capsule 5  . SYMBICORT 80-4.5 MCG/ACT inhaler INHALE 2 PUFFS INTO THE LUNGS 2 (TWO) TIMES DAILY. 10.2 g 2  . traMADol (ULTRAM) 50 MG tablet Take 50 mg by mouth every 6 (six) hours as needed.    . ranitidine (ZANTAC) 150 MG capsule Take 1 capsule (150 mg total) by mouth at bedtime. 30 capsule 3   No facility-administered medications prior to visit.     Allergies  Allergen Reactions  . Fexofenadine Shortness Of Breath  . Erythromycin Nausea Only  . Oxycodone-Acetaminophen Nausea And Vomiting  . Propoxyphene N-Acetaminophen     REACTION: severe vomiting  . Simvastatin     REACTION: Myalgia    Review of Systems  Constitutional: Negative for fever and malaise/fatigue.  HENT: Negative for congestion.   Eyes: Negative for blurred vision.  Respiratory: Negative for shortness of breath.   Cardiovascular: Negative for chest pain, palpitations and leg swelling.  Gastrointestinal: Negative for abdominal pain, blood in stool and nausea.  Genitourinary: Negative for dysuria and frequency.  Musculoskeletal: Positive for back pain and joint pain. Negative for falls.  Skin: Negative for rash.  Neurological: Negative for dizziness, loss of consciousness and headaches.  Endo/Heme/Allergies: Negative for environmental allergies.  Psychiatric/Behavioral: Negative for depression. The patient is not nervous/anxious.        Objective:    Physical Exam  Constitutional: She is oriented to person, place, and time. She appears well-developed and  well-nourished. No distress.  HENT:  Head: Normocephalic and atraumatic.  Nose: Nose normal.  Eyes: Right eye exhibits no discharge. Left eye exhibits no discharge.  Neck: Normal range of motion. Neck supple.  Cardiovascular: Normal rate and regular rhythm.   No murmur heard. Pulmonary/Chest: Effort normal and breath sounds normal.  Abdominal: Soft.  Bowel sounds are normal. There is no tenderness.  Musculoskeletal: She exhibits no edema.  Neurological: She is alert and oriented to person, place, and time.  Skin: Skin is warm and dry.  Psychiatric: She has a normal mood and affect.  Nursing note and vitals reviewed.   BP 110/72   Pulse 69   Temp 97.9 F (36.6 C) (Oral)   Ht 5\' 10"  (1.778 m)   Wt 224 lb 4 oz (101.7 kg)   SpO2 97%   BMI 32.18 kg/m  Wt Readings from Last 3 Encounters:  07/25/15 224 lb 4 oz (101.7 kg)  05/10/15 227 lb 2 oz (103 kg)  02/01/15 224 lb 9.6 oz (101.9 kg)     Lab Results  Component Value Date   WBC 5.2 07/17/2015   HGB 12.8 07/17/2015   HCT 38.4 07/17/2015   PLT 221.0 07/17/2015   GLUCOSE 98 07/17/2015   CHOL 177 07/17/2015   TRIG 148.0 07/17/2015   HDL 36.50 (L) 07/17/2015   LDLCALC 111 (H) 07/17/2015   ALT 15 07/17/2015   AST 14 07/17/2015   NA 142 07/17/2015   K 3.7 07/17/2015   CL 109 07/17/2015   CREATININE 1.12 07/17/2015   BUN 20 07/17/2015   CO2 29 07/17/2015   TSH 3.38 07/17/2015    Lab Results  Component Value Date   TSH 3.38 07/17/2015   Lab Results  Component Value Date   WBC 5.2 07/17/2015   HGB 12.8 07/17/2015   HCT 38.4 07/17/2015   MCV 85.3 07/17/2015   PLT 221.0 07/17/2015   Lab Results  Component Value Date   NA 142 07/17/2015   K 3.7 07/17/2015   CO2 29 07/17/2015   GLUCOSE 98 07/17/2015   BUN 20 07/17/2015   CREATININE 1.12 07/17/2015   BILITOT 0.3 07/17/2015   ALKPHOS 51 07/17/2015   AST 14 07/17/2015   ALT 15 07/17/2015   PROT 7.0 07/17/2015   ALBUMIN 3.7 07/17/2015   CALCIUM 8.8 07/17/2015     ANIONGAP 11 05/07/2015   GFR 60.92 07/17/2015   Lab Results  Component Value Date   CHOL 177 07/17/2015   Lab Results  Component Value Date   HDL 36.50 (L) 07/17/2015   Lab Results  Component Value Date   LDLCALC 111 (H) 07/17/2015   Lab Results  Component Value Date   TRIG 148.0 07/17/2015   Lab Results  Component Value Date   CHOLHDL 5 07/17/2015   No results found for: HGBA1C     Assessment & Plan:   Problem List Items Addressed This Visit    Hyperlipidemia, mixed    Encouraged heart healthy diet, increase exercise, avoid trans fats, consider a krill oil cap daily      Essential hypertension    Well controlled, no changes to meds. Encouraged heart healthy diet such as the DASH diet and exercise as tolerated.       GERD    Avoid offending foods, start probiotics. Do not eat large meals in late evening and consider raising head of bed.       Relevant Medications   ranitidine (ZANTAC) 150 MG capsule   HIP PAIN, RIGHT, CHRONIC - Primary   Relevant Orders   DG Knee Complete 4 Views Right (Completed)   DG HIP UNILAT W OR W/O PELVIS 2-3 VIEWS RIGHT (Completed)   DG Knee Complete 4 Views Left (Completed)   Right knee pain    B/l knee pain and hip pain noted and worsening, will  refer to orthopaedics for further management after xrays. Use topical treatments and maintain activity as much as tolerated      Relevant Orders   DG Knee Complete 4 Views Right (Completed)   DG HIP UNILAT W OR W/O PELVIS 2-3 VIEWS RIGHT (Completed)   Osteopenia    Bone density shows osteopenia, which is thinner than normal but not as bad as osteoporosis. Recommend calcium intake of 1200 to 1500 mg daily, divided into roughly 3 doses. Best source is the diet and a single dairy serving is about 500 mg, a supplement of calcium citrate once or twice daily to balance diet is fine if not getting enough in diet. Also need Vitamin D 2000 IU caps, 1 cap daily if not already taking vitamin D. Also  recommend weight baring exercise on hips and upper body to keep bones strong      Cataracts, bilateral    Has surgery scheduled for left eye surgery in September then right will be in November with Dr Kathlen Mody        Other Visit Diagnoses    Need for viral immunization       Relevant Medications   Zoster Vaccine Live, PF, (ZOSTAVAX) 03474 UNT/0.65ML injection   Need for TD vaccine       Relevant Orders   Td : Tetanus/diphtheria >7yo Preservative  free (Completed)      I am having Ms. Chagoya start on Zoster Vaccine Live (PF). I am also having her maintain her aspirin, calcium carbonate, Fish Oil, Garlic, multivitamin, SYMBICORT, amLODipine, diclofenac sodium, omeprazole, acetaminophen, levocetirizine, acetaminophen-codeine, fluticasone, cyclobenzaprine, ergocalciferol, traMADol, and ranitidine.  Meds ordered this encounter  Medications  . ranitidine (ZANTAC) 150 MG capsule    Sig: Take 1 capsule (150 mg total) by mouth at bedtime.    Dispense:  30 capsule    Refill:  8  . Zoster Vaccine Live, PF, (ZOSTAVAX) 25956 UNT/0.65ML injection    Sig: Inject 19,400 Units into the skin once.    Dispense:  1 each    Refill:  0     Penni Homans, MD

## 2015-08-11 NOTE — Assessment & Plan Note (Signed)

## 2015-08-22 ENCOUNTER — Other Ambulatory Visit: Payer: Self-pay | Admitting: Family Medicine

## 2015-08-22 MED ORDER — AMLODIPINE BESYLATE 10 MG PO TABS: ORAL_TABLET | ORAL | 2 refills | Status: DC

## 2015-08-30 ENCOUNTER — Other Ambulatory Visit: Payer: Self-pay | Admitting: Orthopedic Surgery

## 2015-08-30 DIAGNOSIS — M1611 Unilateral primary osteoarthritis, right hip: Secondary | ICD-10-CM

## 2015-09-11 ENCOUNTER — Ambulatory Visit
Admission: RE | Admit: 2015-09-11 | Discharge: 2015-09-11 | Disposition: A | Discharge status: Home / Self Care | Payer: Medicare Other | Source: Ambulatory Visit | Attending: Orthopedic Surgery | Admitting: Orthopedic Surgery

## 2015-09-11 DIAGNOSIS — M1611 Unilateral primary osteoarthritis, right hip: Secondary | ICD-10-CM

## 2015-09-11 MED ORDER — DIAZEPAM 5 MG PO TABS
5.0000 mg | ORAL_TABLET | Freq: Once | ORAL | Status: AC
  Administered 2015-09-11: 5 mg via ORAL

## 2015-09-11 MED ORDER — IOPAMIDOL (ISOVUE-M 200) INJECTION 41%
15.0000 mL | Freq: Once | INTRAMUSCULAR | Status: AC
  Administered 2015-09-11: 15 mL via INTRATHECAL

## 2015-09-11 NOTE — Progress Notes (Signed)
Pt does not take Tramadol at present.

## 2015-09-11 NOTE — Discharge Instructions (Signed)
Myelogram Discharge Instructions  1. Go home and rest quietly for the next 24 hours.  It is important to lie flat for the next 24 hours.  Get up only to go to the restroom.  You may lie in the bed or on a couch on your back, your stomach, your left side or your right side.  You may have one pillow under your head.  You may have pillows between your knees while you are on your side or under your knees while you are on your back.  2. DO NOT drive today.  Recline the seat as far back as it will go, while still wearing your seat belt, on the way home.  3. You may get up to go to the bathroom as needed.  You may sit up for 10 minutes to eat.  You may resume your normal diet and medications unless otherwise indicated.  Drink lots of extra fluids today and tomorrow.  4. The incidence of headache, nausea, or vomiting is about 5% (one in 20 patients).  If you develop a headache, lie flat and drink plenty of fluids until the headache goes away.  Caffeinated beverages may be helpful.  If you develop severe nausea and vomiting or a headache that does not go away with flat bed rest, call [phone].  5. You may resume normal activities after your 24 hours of bed rest is over; however, do not exert yourself strongly or do any heavy lifting tomorrow. If when you get up you have a headache when standing, go back to bed and force fluids for another 24 hours.  6. Call your physician for a follow-up appointment.  The results of your myelogram will be sent directly to your physician by the following day.  7. If you have any questions or if complications develop after you arrive home, please call [phone].  Discharge instructions have been explained to the patient.  The patient, or the person responsible for the patient, fully understands these instructions.      MAY RESUME TRAMADOL ON AUG. 31, 2017, AFTER 1:00 PM.

## 2015-09-13 ENCOUNTER — Telehealth: Payer: Self-pay

## 2015-09-13 NOTE — Telephone Encounter (Signed)
Spoke with patient after her myelogram here 09/11/15.  She says she's out and about after lying in bed all day yesterday and the day before.  She says she seems to be doing fine and that she doesn't have a headache.  jkl

## 2015-10-01 ENCOUNTER — Ambulatory Visit (INDEPENDENT_AMBULATORY_CARE_PROVIDER_SITE_OTHER): Payer: Medicare Other | Admitting: Physician Assistant

## 2015-10-01 ENCOUNTER — Encounter: Payer: Self-pay | Admitting: Physician Assistant

## 2015-10-01 VITALS — BP 102/68 | HR 64 | Temp 98.2°F | Resp 20 | Ht 70.0 in | Wt 224.1 lb

## 2015-10-01 DIAGNOSIS — J209 Acute bronchitis, unspecified: Secondary | ICD-10-CM | POA: Diagnosis not present

## 2015-10-01 MED ORDER — BENZONATATE 100 MG PO CAPS: 100.0000 mg | ORAL_CAPSULE | Freq: Three times a day (TID) | ORAL | 0 refills | Status: DC | PRN

## 2015-10-01 MED ORDER — IPRATROPIUM-ALBUTEROL 0.5-2.5 (3) MG/3ML IN SOLN
3.0000 mL | Freq: Once | RESPIRATORY_TRACT | Status: AC
  Administered 2015-10-01: 3 mL via RESPIRATORY_TRACT

## 2015-10-01 MED ORDER — PREDNISONE 20 MG PO TABS: 40.0000 mg | ORAL_TABLET | Freq: Every day | ORAL | 0 refills | Status: DC

## 2015-10-01 MED ORDER — DOXYCYCLINE HYCLATE 100 MG PO CAPS: 100.0000 mg | ORAL_CAPSULE | Freq: Two times a day (BID) | ORAL | 0 refills | Status: DC

## 2015-10-01 MED ORDER — IPRATROPIUM-ALBUTEROL 0.5-2.5 (3) MG/3ML IN SOLN: 3.0000 mL | Freq: Four times a day (QID) | RESPIRATORY_TRACT | Status: DC

## 2015-10-01 NOTE — Progress Notes (Signed)
Patient presents to clinic today c/o > 1 week of worsening chest congestion and productive cough. Endorses chest tightness and wheezing. . Denies chest pain, fever, recent travel or sick contact. Endorses mild SOB with coughing only. Has been using Symbicort daily. Has only used albuterol once during this illness.    Past Medical History:  Diagnosis Date  . 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 Acute upper respiratory infection 04/09/2014  . Arthritis    back  . 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 Cataract   . Cataracts, bilateral 07/25/2015  . Cervical cancer screening 07/07/2012  . Colon polyps   . Elevated serum creatinine    with ACE inhibitor (Normal MRA of renal arteries)  . GERD (gastroesophageal reflux disease)   . History of cyst of breast   . Hoarseness of voice 03/13/2012  . Hypertension   . Medicare annual wellness visit, subsequent 09/02/2014  . Muscle spasm of left lower extremity 03/21/2007   Qualifier: Diagnosis of  By: Wynona Luna   . Osteopenia 08/17/2014  . Skin lesion 07/10/2012  . Urinary incontinence 07/10/2012    Current Outpatient Prescriptions on File Prior to Visit  Medication Sig Dispense Refill  . acetaminophen (TYLENOL) 500 MG tablet Take 1 tablet (500 mg total) by mouth every 8 (eight) hours as needed. 90 tablet 0  . acetaminophen-codeine (TYLENOL #3) 300-30 MG tablet Take 1-2 tablets by mouth every 6 (six) hours as needed for severe pain. 15 tablet 0  . amLODipine (NORVASC) 10 MG tablet TAKE 1 TABLET (10 MG TOTAL) BY MOUTH DAILY. 90 tablet 2  . aspirin 81 MG tablet Take 81 mg by mouth daily.      . calcium carbonate (OS-CAL) 600 MG TABS Take 600 mg by mouth daily.      . cyclobenzaprine (FLEXERIL) 10 MG tablet Take 1 tablet (10 mg total) by mouth 2 (two) times daily as needed for muscle spasms. 30 tablet 1  . diclofenac sodium (VOLTAREN) 1 % GEL Apply 1 application topically 3 (three)  times daily. Apply  2 grams three times a day to affected area as needed 100 g 4  . fluticasone (FLONASE) 50 MCG/ACT nasal spray Place 2 sprays into both nostrils daily. 16 g 2  . Garlic [id number] MG CAPS Take 1 capsule by mouth daily.    Marland Kitchen levocetirizine (XYZAL) 5 MG tablet Take 1 tablet (5 mg total) by mouth every evening. 90 tablet 3  . Multiple Vitamin (MULTIVITAMIN) tablet Take 1 tablet by mouth daily.    Marland Kitchen omeprazole (PRILOSEC) 20 MG capsule TAKE 1 TABLET (20 MG TOTAL) BY MOUTH DAILY. 1 CAPSULE BY MOUTH ONCE DAILY 30 MINUTES BEFORE MEALS 30 capsule 5  . ranitidine (ZANTAC) 150 MG capsule Take 1 capsule (150 mg total) by mouth at bedtime. 30 capsule 8  . SYMBICORT 80-4.5 MCG/ACT inhaler INHALE 2 PUFFS INTO THE LUNGS 2 (TWO) TIMES DAILY. 10.2 g 2  . ergocalciferol (VITAMIN D2) 50000 units capsule Take 1 capsule (50,000 Units total) by mouth once a week. (Patient not taking: Reported on 10/01/2015) 4 capsule 4  . traMADol (ULTRAM) 50 MG tablet Take 50 mg by mouth every 6 (six) hours as needed.    . Zoster Vaccine Live, PF, (ZOSTAVAX) 13086 UNT/0.65ML injection Inject 19,400 Units into the skin once. 1 each 0   No current facility-administered medications on file prior to visit.  Allergies  Allergen Reactions  . Allegra [Fexofenadine] Shortness Of Breath  . Erythromycin Nausea Only  . Oxycodone-Acetaminophen Nausea And Vomiting  . Propoxyphene N-Acetaminophen Nausea And Vomiting      severe vomiting  . Zocor [Simvastatin] Other (See Comments)      Myalgia    Family History  Problem Relation Age of Onset  . Brain cancer Mother     deceased age 20  . Alcohol abuse Mother   . Other Brother     died of sepsis  . Gout Brother   . Colon cancer Neg Hx   . Alcohol abuse Father   . Cancer Sister     throat  . Pulmonary embolism Daughter   . Mental illness Daughter     anxiety  . Diabetes Maternal Aunt   . Diabetes Maternal Aunt   . Seizures Maternal Aunt   . Birth defects Maternal  Aunt     Social History   Social History  . Marital status: Married    Spouse name: N/A  . Number of children: N/A  . Years of education: N/A   Social History Main Topics  . Smoking status: Former Smoker    Quit date: 01/13/1991  . Smokeless tobacco: Never Used     Comment: quit 20 years ago 1/2 ppd x 40 years  . Alcohol use No  . Drug use: No  . Sexual activity: No     Comment: lives with husband, no dietary restrictions,    Other Topics Concern  . None   Social History Narrative   Former Smoker - quit 20 years ago (1/2 ppd x 40 years)   Widowed   1 Daughter        Review of Systems - See HPI.  All other ROS are negative.  BP 102/68 (BP Location: Right Arm, Patient Position: Sitting, Cuff Size: Large)   Pulse 64   Temp 98.2 F (36.8 C) (Oral)   Resp 20   Ht 5\' 10"  (1.778 m)   Wt 224 lb 2 oz (101.7 kg)   SpO2 98%   BMI 32.16 kg/m   Physical Exam  Constitutional: She is oriented to person, place, and time and well-developed, well-nourished, and in no distress.  HENT:  Head: Normocephalic and atraumatic.  Right Ear: External ear normal.  Left Ear: External ear normal.  Nose: Nose normal.  Mouth/Throat: Oropharynx is clear and moist. No oropharyngeal exudate.  TM within normal limits bilaterally.  Eyes: Conjunctivae are normal.  Neck: Neck supple.  Cardiovascular: Normal rate, regular rhythm, normal heart sounds and intact distal pulses.   Pulmonary/Chest: No respiratory distress. She has wheezes. She has no rales. She exhibits no tenderness.  Neurological: She is alert and oriented to person, place, and time.  Skin: Skin is warm and dry. No rash noted.  Psychiatric: Affect normal.    Recent Results (from the past 2160 hour(s))  Vitamin D 1,25 dihydroxy     Status: None   Collection Time: 07/17/15 10:42 AM  Result Value Ref Range   Vitamin D 1, 25 (OH)2 Total 60 18 - 72 pg/mL   Vitamin D3 1, 25 (OH)2 46 pg/mL   Vitamin D2 1, 25 (OH)2 14 pg/mL    Comment:  Vitamin D3, 1,25(OH)2 indicates both endogenous production and supplementation.  Vitamin D2, 1,25(OH)2 is an indicator of exogeous sources, such as diet or supplementation.  Interpretation and therapy are based on measurement of Vitamin D,1,25(OH)2, Total. This test was developed and its analytical performance  characteristics have been determined by [facility], [place], [place]. It has not been cleared or approved by the FDA. This assay has been validated pursuant to the CLIA regulations and is used for clinical purposes.   CBC     Status: None   Collection Time: 07/17/15 10:42 AM  Result Value Ref Range   WBC 5.2 4.0 - 10.5 K/uL   RBC 4.50 3.87 - 5.11 Mil/uL   Platelets 221.0 150.0 - 400.0 K/uL   Hemoglobin 12.8 12.0 - 15.0 g/dL   HCT 38.4 36.0 - 46.0 %   MCV 85.3 78.0 - 100.0 fl   MCHC 33.3 30.0 - 36.0 g/dL   RDW 14.3 11.5 - 15.5 %  Comprehensive metabolic panel     Status: None   Collection Time: 07/17/15 10:42 AM  Result Value Ref Range   Sodium 142 135 - 145 mEq/L   Potassium 3.7 3.5 - 5.1 mEq/L   Chloride 109 96 - 112 mEq/L   CO2 29 19 - 32 mEq/L   Glucose, Bld 98 70 - 99 mg/dL   BUN 20 6 - 23 mg/dL   Creatinine, Ser 1.12 0.40 - 1.20 mg/dL   Total Bilirubin 0.3 0.2 - 1.2 mg/dL   Alkaline Phosphatase 51 39 - 117 U/L   AST 14 0 - 37 U/L   ALT 15 0 - 35 U/L   Total Protein 7.0 6.0 - 8.3 g/dL   Albumin 3.7 3.5 - 5.2 g/dL   Calcium 8.8 8.4 - 10.5 mg/dL   GFR 60.92 >60.00 mL/min  TSH     Status: None   Collection Time: 07/17/15 10:42 AM  Result Value Ref Range   TSH 3.38 0.35 - 4.50 uIU/mL  Lipid panel     Status: Abnormal   Collection Time: 07/17/15 10:42 AM  Result Value Ref Range   Cholesterol 177 0 - 200 mg/dL    Comment: ATP III Classification       Desirable:  < 200 mg/dL               Borderline High:  200 - 239 mg/dL          High:  > = 240 mg/dL   Triglycerides 148.0 0.0 - 149.0 mg/dL    Comment: Normal:  <150 mg/dLBorderline High:   150 - 199 mg/dL   HDL 36.50 (L) >39.00 mg/dL   VLDL 29.6 0.0 - 40.0 mg/dL   LDL Cholesterol 111 (H) 0 - 99 mg/dL   Total CHOL/HDL Ratio 5     Comment:                Men          Women1/2 Average Risk     3.4          3.3Average Risk          5.0          4.42X Average Risk          9.6          7.13X Average Risk          15.0          11.0                       NonHDL 140.58     Comment: NOTE:  Non-HDL goal should be 30 mg/dL higher than patient's LDL goal (i.e. LDL goal of < 70 mg/dL, would have non-HDL goal of <  100 mg/dL)   Assessment/Plan: 1. Acute bronchitis, unspecified organism Duoneb given in office. Rx Doxycycline. Will start 5-day burst of prednisone. Tessalon for cough. Supportive measures and OTC medications reviewed. Return precautions discussed with patient.   - doxycycline (VIBRAMYCIN) 100 MG capsule; Take 1 capsule (100 mg total) by mouth 2 (two) times daily.  Dispense: 14 capsule; Refill: 0 - predniSONE (DELTASONE) 20 MG tablet; Take 2 tablets (40 mg total) by mouth daily with breakfast.  Dispense: 10 tablet; Refill: 0 - benzonatate (TESSALON) 100 MG capsule; Take 1 capsule (100 mg total) by mouth 3 (three) times daily as needed.  Dispense: 30 capsule; Refill: 0 - ipratropium-albuterol (DUONEB) 0.5-2.5 (3) MG/3ML nebulizer solution 3 mL; Take 3 mLs by nebulization once.   Leeanne Rio, PA-C

## 2015-10-01 NOTE — Patient Instructions (Signed)
Take antibiotic (Doxycycline) as directed.  Increase fluids.  Get plenty of rest. Use Mucinex for congestion. Take steroid as directed. Use Albuterol inhaler as directed. Take a daily probiotic (I recommend Align or Culturelle, but even Activia Yogurt may be beneficial).  A humidifier placed in the bedroom may offer some relief for a dry, scratchy throat of nasal irritation.  Read information below on acute bronchitis. Please call or return to clinic if symptoms are not improving.  Acute Bronchitis Bronchitis is when the airways that extend from the windpipe into the lungs get red, puffy, and painful (inflamed). Bronchitis often causes thick spit (mucus) to develop. This leads to a cough. A cough is the most common symptom of bronchitis. In acute bronchitis, the condition usually begins suddenly and goes away over time (usually in 2 weeks). Smoking, allergies, and asthma can make bronchitis worse. Repeated episodes of bronchitis may cause more lung problems.  HOME CARE  Rest.  Drink enough fluids to keep your pee (urine) clear or pale yellow (unless you need to limit fluids as told by your doctor).  Only take over-the-counter or prescription medicines as told by your doctor.  Avoid smoking and secondhand smoke. These can make bronchitis worse. If you are a smoker, think about using nicotine gum or skin patches. Quitting smoking will help your lungs heal faster.  Reduce the chance of getting bronchitis again by:  Washing your hands often.  Avoiding people with cold symptoms.  Trying not to touch your hands to your mouth, nose, or eyes.  Follow up with your doctor as told.  GET HELP IF: Your symptoms do not improve after 1 week of treatment. Symptoms include:  Cough.  Fever.  Coughing up thick spit.  Body aches.  Chest congestion.  Chills.  Shortness of breath.  Sore throat.  GET HELP RIGHT AWAY IF:   You have an increased fever.  You have chills.  You have severe  shortness of breath.  You have bloody thick spit (sputum).  You throw up (vomit) often.  You lose too much body fluid (dehydration).  You have a severe headache.  You faint.  MAKE SURE YOU:   Understand these instructions.  Will watch your condition.  Will get help right away if you are not doing well or get worse. Document Released: 06/17/2007 Document Revised: 08/31/2012 Document Reviewed: 06/21/2012 [facility] Patient Information 2015 [place], [place]. This information is not intended to replace advice given to you by your health care provider. Make sure you discuss any questions you have with your health care provider.

## 2015-11-13 ENCOUNTER — Other Ambulatory Visit: Payer: Self-pay | Admitting: Family Medicine

## 2016-01-15 ENCOUNTER — Other Ambulatory Visit: Payer: Self-pay | Admitting: Family Medicine

## 2016-01-27 ENCOUNTER — Encounter: Payer: Medicare Other | Admitting: Family Medicine

## 2016-02-06 ENCOUNTER — Ambulatory Visit: Payer: Medicare Other | Admitting: Family Medicine

## 2016-02-07 ENCOUNTER — Ambulatory Visit: Payer: Medicare Other | Admitting: Family Medicine

## 2016-02-07 ENCOUNTER — Telehealth: Payer: Self-pay | Admitting: Family

## 2016-02-07 ENCOUNTER — Ambulatory Visit (HOSPITAL_BASED_OUTPATIENT_CLINIC_OR_DEPARTMENT_OTHER)
Admission: RE | Admit: 2016-02-07 | Discharge: 2016-02-07 | Disposition: A | Discharge status: Home / Self Care | Payer: Medicare Other | Source: Ambulatory Visit | Attending: Family | Admitting: Family

## 2016-02-07 ENCOUNTER — Ambulatory Visit (INDEPENDENT_AMBULATORY_CARE_PROVIDER_SITE_OTHER): Payer: Medicare Other | Admitting: Family

## 2016-02-07 ENCOUNTER — Encounter: Payer: Self-pay | Admitting: Family

## 2016-02-07 VITALS — BP 120/64 | HR 63 | Temp 98.1°F | Resp 16 | Ht 70.0 in | Wt 234.4 lb

## 2016-02-07 DIAGNOSIS — J209 Acute bronchitis, unspecified: Secondary | ICD-10-CM | POA: Diagnosis not present

## 2016-02-07 MED ORDER — ALBUTEROL SULFATE HFA 108 (90 BASE) MCG/ACT IN AERS: 2.0000 | INHALATION_SPRAY | Freq: Four times a day (QID) | RESPIRATORY_TRACT | 0 refills | Status: DC | PRN

## 2016-02-07 MED ORDER — DOXYCYCLINE HYCLATE 100 MG PO TABS: 100.0000 mg | ORAL_TABLET | Freq: Two times a day (BID) | ORAL | 0 refills | Status: DC

## 2016-02-07 MED ORDER — PREDNISONE 10 MG PO TABS: ORAL_TABLET | ORAL | 0 refills | Status: DC

## 2016-02-07 NOTE — Patient Instructions (Signed)
Please complete x ray on the first floor. Begin prednisone taper. Continue symbicort. Add albuterol 2 puffs every 6 hours as needed for shortness of breath/wheezing. Call if new/worsening symptoms or if symptoms are not improved 3 days.

## 2016-02-07 NOTE — Telephone Encounter (Signed)
Notified pt and she voices understanding. 

## 2016-02-07 NOTE — Progress Notes (Signed)
Subjective:    Patient ID: Terri Mckee, female    DOB: 1940/05/19, 76 y.o.   MRN: [id number]  HPI  Terri Mckee is a 76 y rold female who presents today with chief complaint of cough. Cough has been present x 3 weeks and is productive of yellow sputum. Reports cough is worst at night.  No improvement with OTC meds. Denies associated fever.  Energy is fine.  Has had some wheezing and reports some SOB when she walks up the stairs which is new for her since "I got this cough."     Review of Systems See HPI  Past Medical History:  Diagnosis Date  . 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 Acute upper respiratory infection 04/09/2014  . Arthritis    back  . 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 Cataract   . Cataracts, bilateral 07/25/2015  . Cervical cancer screening 07/07/2012  . Colon polyps   . Elevated serum creatinine    with ACE inhibitor (Normal MRA of renal arteries)  . GERD (gastroesophageal reflux disease)   . History of cyst of breast   . Hoarseness of voice 03/13/2012  . Hypertension   . Medicare annual wellness visit, subsequent 09/02/2014  . Muscle spasm of left lower extremity 03/21/2007   Qualifier: Diagnosis of  By: Wynona Luna   . Osteopenia 08/17/2014  . Skin lesion 07/10/2012  . Urinary incontinence 07/10/2012     Social History   Social History  . Marital status: Married    Spouse name: N/A  . Number of children: N/A  . Years of education: N/A   Occupational History  . Not on file.   Social History Main Topics  . Smoking status: Former Smoker    Quit date: 01/13/1991  . Smokeless tobacco: Never Used     Comment: quit 20 years ago 1/2 ppd x 40 years  . Alcohol use No  . Drug use: No  . Sexual activity: No     Comment: lives with husband, no dietary restrictions,    Other Topics Concern  . Not on file   Social History Narrative   Former Smoker - quit 20 years ago (1/2 ppd x 40  years)   Widowed   1 Daughter         Past Surgical History:  Procedure Laterality Date  . APPENDECTOMY    . BREAST CYST EXCISION     right breast, twice  . OVARY SURGERY     right ovary & tube replacement  . WRIST GANGLION EXCISION     rt    Family History  Problem Relation Age of Onset  . Brain cancer Mother     deceased age 30  . Alcohol abuse Mother   . Other Brother     died of sepsis  . Gout Brother   . Colon cancer Neg Hx   . Alcohol abuse Father   . Cancer Sister     throat  . Pulmonary embolism Daughter   . Mental illness Daughter     anxiety  . Diabetes Maternal Aunt   . Diabetes Maternal Aunt   . Seizures Maternal Aunt   . Birth defects Maternal Aunt     Allergies  Allergen Reactions  . Allegra [Fexofenadine] Shortness Of Breath  . Erythromycin Nausea Only  . Oxycodone-Acetaminophen Nausea And Vomiting  . Propoxyphene N-Acetaminophen Nausea And Vomiting  severe vomiting  . Zocor [Simvastatin] Other (See Comments)      Myalgia    Current Outpatient Prescriptions on File Prior to Visit  Medication Sig Dispense Refill  . acetaminophen (TYLENOL) 500 MG tablet Take 1 tablet (500 mg total) by mouth every 8 (eight) hours as needed. 90 tablet 0  . acetaminophen-codeine (TYLENOL #3) 300-30 MG tablet Take 1-2 tablets by mouth every 6 (six) hours as needed for severe pain. 15 tablet 0  . amLODipine (NORVASC) 10 MG tablet TAKE 1 TABLET (10 MG TOTAL) BY MOUTH DAILY. 90 tablet 2  . aspirin 81 MG tablet Take 81 mg by mouth daily.      . calcium carbonate (OS-CAL) 600 MG TABS Take 600 mg by mouth daily.      . cyclobenzaprine (FLEXERIL) 10 MG tablet Take 1 tablet (10 mg total) by mouth 2 (two) times daily as needed for muscle spasms. 30 tablet 1  . diclofenac sodium (VOLTAREN) 1 % GEL Apply 1 application topically 3 (three) times daily. Apply  2 grams three times a day to affected area as needed 100 g 4  . fluticasone (FLONASE) 50 MCG/ACT nasal spray Place 2  sprays into both nostrils daily. 16 g 2  . Garlic [id number] MG CAPS Take 1 capsule by mouth daily.    Javier Docker Oil 1000 MG CAPS Take 1,000 capsules by mouth daily.    Marland Kitchen levocetirizine (XYZAL) 5 MG tablet Take 1 tablet (5 mg total) by mouth every evening. 90 tablet 3  . Multiple Vitamin (MULTIVITAMIN) tablet Take 1 tablet by mouth daily.    Marland Kitchen omeprazole (PRILOSEC) 20 MG capsule TAKE 1 TABLET (20 MG TOTAL) BY MOUTH DAILY. 1 CAPSULE BY MOUTH ONCE DAILY 30 MINUTES BEFORE MEALS 30 capsule 5  . ranitidine (ZANTAC) 150 MG tablet TAKE 1 TABLET AT BEDTIME 30 tablet 0  . SYMBICORT 80-4.5 MCG/ACT inhaler INHALE 2 PUFFS INTO THE LUNGS 2 (TWO) TIMES DAILY. 10.2 g 2  . traMADol (ULTRAM) 50 MG tablet Take 50 mg by mouth every 6 (six) hours as needed.     No current facility-administered medications on file prior to visit.     BP 120/64 (BP Location: Right Arm, Cuff Size: Large)   Pulse 63   Temp 98.1 F (36.7 C) (Oral)   Resp 16   Ht 5\' 10"  (1.778 m)   Wt 234 lb 6.4 oz (106.3 kg)   SpO2 100% Comment: room air  BMI 33.63 kg/m       Objective:   Physical Exam  Constitutional: She is oriented to person, place, and time. She appears well-developed and well-nourished.  HENT:  Head: Normocephalic and atraumatic.  Right Ear: Tympanic membrane and ear canal normal.  Left Ear: Tympanic membrane and ear canal normal.  Mouth/Throat: No oropharyngeal exudate, posterior oropharyngeal edema or posterior oropharyngeal erythema.  Cardiovascular: Normal rate, regular rhythm and normal heart sounds.   No murmur heard. Pulmonary/Chest: Effort normal. No respiratory distress. She has wheezes. She has no rhonchi. She has no rales.  Musculoskeletal: She exhibits no edema.  Neurological: She is alert and oriented to person, place, and time.  Psychiatric: She has a normal mood and affect. Her behavior is normal. Judgment and thought content normal.          Assessment & Plan:  Bronchitis with bronchospasm- will rx  with pred taper, add albuterol. Continue symbicort. Obtain CXR. Will select abx pending review of CXR findings.

## 2016-02-07 NOTE — Telephone Encounter (Signed)
cxr neg for PNA. I have sent rx for doxycycline.

## 2016-02-07 NOTE — Progress Notes (Signed)
Pre visit review using our clinic review tool, if applicable. No additional management support is needed unless otherwise documented below in the visit note. 

## 2016-02-11 ENCOUNTER — Encounter: Payer: Medicare Other | Admitting: Family Medicine

## 2016-02-25 ENCOUNTER — Other Ambulatory Visit: Payer: Self-pay | Admitting: Family Medicine

## 2016-04-03 ENCOUNTER — Ambulatory Visit (INDEPENDENT_AMBULATORY_CARE_PROVIDER_SITE_OTHER): Payer: Medicare Other | Admitting: Family Medicine

## 2016-04-03 ENCOUNTER — Encounter: Payer: Self-pay | Admitting: Family Medicine

## 2016-04-03 DIAGNOSIS — M25551 Pain in right hip: Secondary | ICD-10-CM | POA: Diagnosis not present

## 2016-04-03 DIAGNOSIS — I10 Essential (primary) hypertension: Secondary | ICD-10-CM

## 2016-04-03 DIAGNOSIS — Z0000 Encounter for general adult medical examination without abnormal findings: Secondary | ICD-10-CM

## 2016-04-03 DIAGNOSIS — E782 Mixed hyperlipidemia: Secondary | ICD-10-CM

## 2016-04-03 DIAGNOSIS — E559 Vitamin D deficiency, unspecified: Secondary | ICD-10-CM

## 2016-04-03 HISTORY — DX: Vitamin D deficiency, unspecified: E55.9

## 2016-04-03 LAB — COMPREHENSIVE METABOLIC PANEL
ALBUMIN: 3.8 g/dL (ref 3.5–5.2)
ALT: 12 U/L (ref 0–35)
AST: 11 U/L (ref 0–37)
Alkaline Phosphatase: 58 U/L (ref 39–117)
BUN: 13 mg/dL (ref 6–23)
CO2: 28 mEq/L (ref 19–32)
CREATININE: 1.2 mg/dL (ref 0.40–1.20)
Calcium: 9 mg/dL (ref 8.4–10.5)
Chloride: 107 mEq/L (ref 96–112)
GFR: 56.15 mL/min — ABNORMAL LOW (ref >60.00)
GLUCOSE: 84 mg/dL (ref 70–99)
POTASSIUM: 3.9 meq/L (ref 3.5–5.1)
SODIUM: 142 meq/L (ref 135–145)
Total Bilirubin: 0.4 mg/dL (ref 0.2–1.2)
Total Protein: 7.1 g/dL (ref 6.0–8.3)

## 2016-04-03 LAB — LIPID PANEL
Cholesterol: 181 mg/dL (ref 0–200)
HDL: 36.8 mg/dL — ABNORMAL LOW
LDL Cholesterol: 127 mg/dL — ABNORMAL HIGH (ref 0–99)
NonHDL: 144.36
Total CHOL/HDL Ratio: 5
Triglycerides: 86 mg/dL (ref 0.0–149.0)
VLDL: 17.2 mg/dL (ref 0.0–40.0)

## 2016-04-03 LAB — CBC
HCT: 39.5 % (ref 36.0–46.0)
Hemoglobin: 13.1 g/dL (ref 12.0–15.0)
MCHC: 33.1 g/dL (ref 30.0–36.0)
MCV: 86.4 fl (ref 78.0–100.0)
Platelets: 238 K/uL (ref 150.0–400.0)
RBC: 4.58 Mil/uL (ref 3.87–5.11)
RDW: 15.5 % (ref 11.5–15.5)
WBC: 5.6 K/uL (ref 4.0–10.5)

## 2016-04-03 LAB — TSH: TSH: 1.87 u[IU]/mL (ref 0.35–4.50)

## 2016-04-03 MED ORDER — ACETAMINOPHEN-CODEINE #3 300-30 MG PO TABS: 1.0000 | ORAL_TABLET | Freq: Two times a day (BID) | ORAL | 0 refills | Status: DC | PRN

## 2016-04-03 NOTE — Patient Instructions (Addendum)
ICY Hot, Salon Pas, Aspercreme, Lidocaine Patches for the hip pain  You can take a Tylenol #3, with a regular Tylenol    Preventive Care 76 Years and Older, Female Preventive care refers to lifestyle choices and visits with your health care provider that can promote health and wellness. What does preventive care include?  A yearly physical exam. This is also called an annual well check.  Dental exams once or twice a year.  Routine eye exams. Ask your health care provider how often you should have your eyes checked.  Personal lifestyle choices, including:  Daily care of your teeth and gums.  Regular physical activity.  Eating a healthy diet.  Avoiding tobacco and drug use.  Limiting alcohol use.  Practicing safe sex.  Taking low-dose aspirin every day.  Taking vitamin and mineral supplements as recommended by your health care provider. What happens during an annual well check? The services and screenings done by your health care provider during your annual well check will depend on your age, overall health, lifestyle risk factors, and family history of disease. Counseling  Your health care provider may ask you questions about your:  Alcohol use.  Tobacco use.  Drug use.  Emotional well-being.  Home and relationship well-being.  Sexual activity.  Eating habits.  History of falls.  Memory and ability to understand (cognition).  Work and work Statistician.  Reproductive health. Screening  You may have the following tests or measurements:  Height, weight, and BMI.  Blood pressure.  Lipid and cholesterol levels. These may be checked every 5 years, or more frequently if you are over 100 years old.  Skin check.  Lung cancer screening. You may have this screening every year starting at age 38 if you have a 30-pack-year history of smoking and currently smoke or have quit within the past 15 years.  Fecal occult blood test (FOBT) of the stool. You may have  this test every year starting at age 34.  Flexible sigmoidoscopy or colonoscopy. You may have a sigmoidoscopy every 5 years or a colonoscopy every 10 years starting at age 26.  Hepatitis C blood test.  Hepatitis B blood test.  Sexually transmitted disease (STD) testing.  Diabetes screening. This is done by checking your blood sugar (glucose) after you have not eaten for a while (fasting). You may have this done every 1-3 years.  Bone density scan. This is done to screen for osteoporosis. You may have this done starting at age 53.  Mammogram. This may be done every 1-2 years. Talk to your health care provider about how often you should have regular mammograms. Talk with your health care provider about your test results, treatment options, and if necessary, the need for more tests. Vaccines  Your health care provider may recommend certain vaccines, such as:  Influenza vaccine. This is recommended every year.  Tetanus, diphtheria, and acellular pertussis (Tdap, Td) vaccine. You may need a Td booster every 10 years.  Varicella vaccine. You may need this if you have not been vaccinated.  Zoster vaccine. You may need this after age 85.  Measles, mumps, and rubella (MMR) vaccine. You may need at least one dose of MMR if you were born in 1957 or later. You may also need a second dose.  Pneumococcal 13-valent conjugate (PCV13) vaccine. One dose is recommended after age 48.  Pneumococcal polysaccharide (PPSV23) vaccine. One dose is recommended after age 30.  Meningococcal vaccine. You may need this if you have certain conditions.  Hepatitis  A vaccine. You may need this if you have certain conditions or if you travel or work in places where you may be exposed to hepatitis A.  Hepatitis B vaccine. You may need this if you have certain conditions or if you travel or work in places where you may be exposed to hepatitis B.  Haemophilus influenzae type b (Hib) vaccine. You may need this if you  have certain conditions. Talk to your health care provider about which screenings and vaccines you need and how often you need them. This information is not intended to replace advice given to you by your health care provider. Make sure you discuss any questions you have with your health care provider. Document Released: 01/25/2015 Document Revised: 09/18/2015 Document Reviewed: 10/30/2014 Elsevier Interactive Patient Education  2017 [employer].

## 2016-04-03 NOTE — Progress Notes (Signed)
Pre visit review using our clinic review tool, if applicable. No additional management support is needed unless otherwise documented below in the visit note. 

## 2016-04-03 NOTE — Assessment & Plan Note (Signed)
Encouraged to get adequate exercise, calcium and vitamin d intake 

## 2016-04-03 NOTE — Progress Notes (Signed)
Subjective:  I acted as a Education administrator for Dr. Charlett Blake. [place], [place]   Patient ID: Terri Mckee, female    DOB: [birth year], 76 y.o.   MRN: [id number]  Chief Complaint  Patient presents with  . Annual Exam  . Hypertension  . Hyperlipidemia    HPI  Patient is in today for an annual exam following up on hypertension, hyperlipidemia an other medical concerns. Patient has no concerns today. She does endorse persistnet right knee and right hip pain. No recent falls or trauma. She is still able to perform her ADLs at home. No recent febrile illness or hospitalizations. Denies CP/palp/SOB/HA/congestion/fevers/GI or GU c/o. Taking meds as prescribed  Patient Care Team: [name], MD as PCP - General (Family Medicine) Berle Mull, MD as Consulting Physician (Family Medicine) Irene Shipper, MD as Consulting Physician (Gastroenterology) Bjorn Loser, MD as Consulting Physician (Urology) Druscilla Brownie, MD as Consulting Physician (Dermatology)   Past Medical History:  Diagnosis Date  . 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 Acute upper respiratory infection 04/09/2014  . Arthritis    back  . 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 Cataract   . Cataracts, bilateral 07/25/2015  . Cervical cancer screening 07/07/2012  . Colon polyps   . Elevated serum creatinine    with ACE inhibitor (Normal MRA of renal arteries)  . GERD (gastroesophageal reflux disease)   . History of cyst of breast   . Hoarseness of voice 03/13/2012  . Hypertension   . Medicare annual wellness visit, subsequent 09/02/2014  . Muscle spasm of left lower extremity 03/21/2007   Qualifier: Diagnosis of  By: Wynona Luna   . Osteopenia 08/17/2014  . Preventative health care 04/05/2016  . Skin lesion 07/10/2012  . Urinary incontinence 07/10/2012  . Vitamin D deficiency 04/03/2016    Past Surgical History:  Procedure Laterality Date  . APPENDECTOMY    .  BREAST CYST EXCISION     right breast, twice  . OVARY SURGERY     right ovary & tube replacement  . WRIST GANGLION EXCISION     rt    Family History  Problem Relation Age of Onset  . Brain cancer Mother     deceased age 50  . Alcohol abuse Mother   . Other Brother     died of sepsis  . Gout Brother   . Colon cancer Neg Hx   . Alcohol abuse Father   . Cancer Sister     throat  . Pulmonary embolism Daughter   . Mental illness Daughter     anxiety  . Diabetes Maternal Aunt   . Diabetes Maternal Aunt   . Seizures Maternal Aunt   . Birth defects Maternal Aunt     Social History   Social History  . Marital status: Married    Spouse name: N/A  . Number of children: N/A  . Years of education: N/A   Occupational History  . Not on file.   Social History Main Topics  . Smoking status: Former Smoker    Quit date: 01/13/1991  . Smokeless tobacco: Never Used     Comment: quit 20 years ago 1/2 ppd x 40 years  . Alcohol use No  . Drug use: No  . Sexual activity: No     Comment: lives with husband, no dietary restrictions,    Other Topics Concern  . Not  on file   Social History Narrative   Former Smoker - quit 20 years ago (1/2 ppd x 40 years)   Widowed   1 Daughter         Outpatient Medications Prior to Visit  Medication Sig Dispense Refill  . acetaminophen (TYLENOL) 500 MG tablet Take 1 tablet (500 mg total) by mouth every 8 (eight) hours as needed. 90 tablet 0  . albuterol (PROVENTIL HFA;VENTOLIN HFA) 108 (90 Base) MCG/ACT inhaler Inhale 2 puffs into the lungs every 6 (six) hours as needed for wheezing or shortness of breath. 1 Inhaler 0  . amLODipine (NORVASC) 10 MG tablet TAKE 1 TABLET (10 MG TOTAL) BY MOUTH DAILY. 90 tablet 2  . aspirin 81 MG tablet Take 81 mg by mouth daily.      . calcium carbonate (OS-CAL) 600 MG TABS Take 600 mg by mouth daily.      . cyclobenzaprine (FLEXERIL) 10 MG tablet Take 1 tablet (10 mg total) by mouth 2 (two) times daily as needed  for muscle spasms. 30 tablet 1  . diclofenac sodium (VOLTAREN) 1 % GEL Apply 1 application topically 3 (three) times daily. Apply  2 grams three times a day to affected area as needed 100 g 4  . fluticasone (FLONASE) 50 MCG/ACT nasal spray Place 2 sprays into both nostrils daily. 16 g 2  . Garlic 440 MG CAPS Take 1 capsule by mouth daily.    Javier Docker Oil 1000 MG CAPS Take 1,000 capsules by mouth daily.    Marland Kitchen levocetirizine (XYZAL) 5 MG tablet Take 1 tablet (5 mg total) by mouth every evening. 90 tablet 3  . Multiple Vitamin (MULTIVITAMIN) tablet Take 1 tablet by mouth daily.    Marland Kitchen omeprazole (PRILOSEC) 20 MG capsule TAKE 1 TABLET (20 MG TOTAL) BY MOUTH DAILY. 1 CAPSULE BY MOUTH ONCE DAILY 30 MINUTES BEFORE MEALS 30 capsule 5  . ranitidine (ZANTAC) 150 MG tablet TAKE 1 TABLET AT BEDTIME 30 tablet 2  . SYMBICORT 80-4.5 MCG/ACT inhaler INHALE 2 PUFFS INTO THE LUNGS 2 (TWO) TIMES DAILY. 10.2 g 2  . acetaminophen-codeine (TYLENOL #3) 300-30 MG tablet Take 1-2 tablets by mouth every 6 (six) hours as needed for severe pain. 15 tablet 0  . doxycycline (VIBRA-TABS) 100 MG tablet Take 1 tablet (100 mg total) by mouth 2 (two) times daily. 14 tablet 0  . predniSONE (DELTASONE) 10 MG tablet 4 tabs by mouth once daily for 2 days, then 3 tabs daily x 2 days, then 2 tabs daily x 2 days, then 1 tab daily x 2 days 20 tablet 0  . traMADol (ULTRAM) 50 MG tablet Take 50 mg by mouth every 6 (six) hours as needed.     No facility-administered medications prior to visit.     Allergies  Allergen Reactions  . Allegra [Fexofenadine] Shortness Of Breath  . Erythromycin Nausea Only  . Oxycodone-Acetaminophen Nausea And Vomiting  . Propoxyphene N-Acetaminophen Nausea And Vomiting      severe vomiting  . Zocor [Simvastatin] Other (See Comments)      Myalgia    Review of Systems  Constitutional: Positive for malaise/fatigue. Negative for chills and fever.  HENT: Negative for congestion and hearing loss.   Eyes:  Negative for discharge.  Respiratory: Negative for cough, sputum production and shortness of breath.   Cardiovascular: Negative for chest pain, palpitations and leg swelling.  Gastrointestinal: Negative for abdominal pain, blood in stool, constipation, diarrhea, heartburn, nausea and vomiting.  Genitourinary: Negative for dysuria,  frequency, hematuria and urgency.  Musculoskeletal: Positive for joint pain. Negative for back pain, falls and myalgias.  Skin: Negative for rash.  Neurological: Negative for dizziness, sensory change, loss of consciousness, weakness and headaches.  Endo/Heme/Allergies: Negative for environmental allergies. Does not bruise/bleed easily.  Psychiatric/Behavioral: Negative for depression and suicidal ideas. The patient is not nervous/anxious and does not have insomnia.        Objective:    Physical Exam  Constitutional: She is oriented to person, place, and time. She appears well-developed and well-nourished. No distress.  HENT:  Head: Normocephalic and atraumatic.  Eyes: Conjunctivae are normal.  Neck: Neck supple. No thyromegaly present.  Cardiovascular: Normal rate, regular rhythm and normal heart sounds.   No murmur heard. Pulmonary/Chest: Effort normal and breath sounds normal. No respiratory distress.  Abdominal: Soft. Bowel sounds are normal. She exhibits no distension and no mass. There is no tenderness.  Musculoskeletal: She exhibits no edema.  Lymphadenopathy:    She has no cervical adenopathy.  Neurological: She is alert and oriented to person, place, and time.  Skin: Skin is warm and dry.  Psychiatric: She has a normal mood and affect. Her behavior is normal.    There were no vitals taken for this visit. Wt Readings from Last 3 Encounters:  02/07/16 234 lb 6.4 oz (106.3 kg)  10/01/15 224 lb 2 oz (101.7 kg)  07/25/15 224 lb 4 oz (101.7 kg)   BP Readings from Last 3 Encounters:  02/07/16 120/64  10/01/15 102/68  09/11/15 (!) 168/81      Immunization History  Administered Date(s) Administered  . Pneumococcal Conjugate-13 09/21/2013  . Pneumococcal Polysaccharide-23 10/31/2010  . Td 07/25/2015    Health Maintenance  Topic Date Due  . INFLUENZA VACCINE  03/14/2018 (Originally 08/13/2015)  . COLONOSCOPY  10/05/2016  . TETANUS/TDAP  07/24/2025  . DEXA SCAN  Completed  . PNA vac Low Risk Adult  Completed    Lab Results  Component Value Date   WBC 5.6 04/03/2016   HGB 13.1 04/03/2016   HCT 39.5 04/03/2016   PLT 238.0 04/03/2016   GLUCOSE 84 04/03/2016   CHOL 181 04/03/2016   TRIG 86.0 04/03/2016   HDL 36.80 (L) 04/03/2016   LDLCALC 127 (H) 04/03/2016   ALT 12 04/03/2016   AST 11 04/03/2016   NA 142 04/03/2016   K 3.9 04/03/2016   CL 107 04/03/2016   CREATININE 1.20 04/03/2016   BUN 13 04/03/2016   CO2 28 04/03/2016   TSH 1.87 04/03/2016    Lab Results  Component Value Date   TSH 1.87 04/03/2016   Lab Results  Component Value Date   WBC 5.6 04/03/2016   HGB 13.1 04/03/2016   HCT 39.5 04/03/2016   MCV 86.4 04/03/2016   PLT 238.0 04/03/2016   Lab Results  Component Value Date   NA 142 04/03/2016   K 3.9 04/03/2016   CO2 28 04/03/2016   GLUCOSE 84 04/03/2016   BUN 13 04/03/2016   CREATININE 1.20 04/03/2016   BILITOT 0.4 04/03/2016   ALKPHOS 58 04/03/2016   AST 11 04/03/2016   ALT 12 04/03/2016   PROT 7.1 04/03/2016   ALBUMIN 3.8 04/03/2016   CALCIUM 9.0 04/03/2016   ANIONGAP 11 05/07/2015   GFR 56.15 (L) 04/03/2016   Lab Results  Component Value Date   CHOL 181 04/03/2016   Lab Results  Component Value Date   HDL 36.80 (L) 04/03/2016   Lab Results  Component Value Date   LDLCALC 127 (  H) 04/03/2016   Lab Results  Component Value Date   TRIG 86.0 04/03/2016   Lab Results  Component Value Date   CHOLHDL 5 04/03/2016   No results found for: HGBA1C       Assessment & Plan:   Problem List Items Addressed This Visit    Hyperlipidemia, mixed - Primary    Encouraged  heart healthy diet, increase exercise, avoid trans fats, consider a krill oil cap daily      Relevant Orders   Lipid panel (Completed)   Essential hypertension   Relevant Orders   CBC (Completed)   Comprehensive metabolic panel (Completed)   TSH (Completed)   HIP PAIN, RIGHT, CHRONIC    Continues to plague her. Encouraged moist heat and gentle stretching as tolerated. May try NSAIDs and prescription meds as directed and report if symptoms worsen or seek immediate care. Try patches she willl let us know when she is ready for referral and further evaluation.       Vitamin D deficiency    Not taking any presently but will check level and is encouraged to take a daily otc 1000 to 2000 IU daily      Relevant Orders   Vitamin D 1,25 dihydroxy   Preventative health care    Patient encouraged to maintain heart healthy diet, regular exercise, adequate sleep. Consider daily probiotics. Take medications as pres. Given and reviewed copy of ACP documents from [place] and encouraged to complete and return         I have discontinued Ms. Winkowski's traMADol, predniSONE, and doxycycline. I have also changed her acetaminophen-codeine. Additionally, I am having her maintain her aspirin, calcium carbonate, Garlic, multivitamin, SYMBICORT, diclofenac sodium, omeprazole, acetaminophen, levocetirizine, fluticasone, cyclobenzaprine, amLODipine, Krill Oil, albuterol, and ranitidine.  Meds ordered this encounter  Medications  . acetaminophen-codeine (TYLENOL #3) 300-30 MG tablet    Sig: Take 1-2 tablets by mouth 2 (two) times daily as needed for severe pain.    Dispense:  20 tablet    Refill:  0    CMA served as scribe during this visit. History, Physical and Plan performed by medical provider. Documentation and orders reviewed and attested to.  Penni Homans, MD

## 2016-04-03 NOTE — Assessment & Plan Note (Signed)
On a bad night has significant right sided low back pain with radicular pain down the right leg, may use the Tylenol with Codeine prn.

## 2016-04-03 NOTE — Assessment & Plan Note (Signed)
Encouraged heart healthy diet, increase exercise, avoid trans fats, consider a krill oil cap daily 

## 2016-04-03 NOTE — Assessment & Plan Note (Signed)
Not taking any presently but will check level and is encouraged to take a daily otc 1000 to 2000 IU daily

## 2016-04-05 ENCOUNTER — Encounter: Payer: Self-pay | Admitting: Family Medicine

## 2016-04-05 DIAGNOSIS — Z0000 Encounter for general adult medical examination without abnormal findings: Secondary | ICD-10-CM

## 2016-04-05 HISTORY — DX: Encounter for general adult medical examination without abnormal findings: Z00.00

## 2016-04-05 LAB — VITAMIN D 1,25 DIHYDROXY
VITAMIN D 1, 25 (OH) TOTAL: 48 pg/mL (ref 18–72)
VITAMIN D3 1, 25 (OH): 48 pg/mL

## 2016-04-05 NOTE — Assessment & Plan Note (Signed)
Patient encouraged to maintain heart healthy diet, regular exercise, adequate sleep. Consider daily probiotics. Take medications as pres. Given and reviewed copy of ACP documents from [employer] and encouraged to complete and return

## 2016-04-05 NOTE — Assessment & Plan Note (Signed)
Continues to plague her. Encouraged moist heat and gentle stretching as tolerated. May try NSAIDs and prescription meds as directed and report if symptoms worsen or seek immediate care. Try patches she willl let us know when she is ready for referral and further evaluation.

## 2016-05-23 ENCOUNTER — Other Ambulatory Visit: Payer: Self-pay | Admitting: Family Medicine

## 2016-06-02 ENCOUNTER — Other Ambulatory Visit: Payer: Self-pay | Admitting: Family Medicine

## 2016-06-10 ENCOUNTER — Other Ambulatory Visit: Payer: Self-pay | Admitting: Family Medicine

## 2016-07-09 ENCOUNTER — Other Ambulatory Visit: Payer: Self-pay | Admitting: Family Medicine

## 2016-08-13 ENCOUNTER — Other Ambulatory Visit: Payer: Self-pay | Admitting: *Deleted

## 2016-08-13 MED ORDER — RANITIDINE HCL 150 MG PO TABS: 150.0000 mg | ORAL_TABLET | Freq: Every day | ORAL | 0 refills | Status: DC

## 2016-09-08 ENCOUNTER — Other Ambulatory Visit: Payer: Self-pay | Admitting: Family Medicine

## 2016-09-08 DIAGNOSIS — Z1231 Encounter for screening mammogram for malignant neoplasm of breast: Secondary | ICD-10-CM

## 2016-09-23 ENCOUNTER — Ambulatory Visit
Admission: RE | Admit: 2016-09-23 | Discharge: 2016-09-23 | Disposition: A | Discharge status: Home / Self Care | Payer: Medicare Other | Source: Ambulatory Visit | Attending: Family Medicine | Admitting: Family Medicine

## 2016-09-23 DIAGNOSIS — Z1231 Encounter for screening mammogram for malignant neoplasm of breast: Secondary | ICD-10-CM

## 2016-09-24 ENCOUNTER — Other Ambulatory Visit: Payer: Self-pay | Admitting: Family Medicine

## 2016-09-24 DIAGNOSIS — R928 Other abnormal and inconclusive findings on diagnostic imaging of breast: Secondary | ICD-10-CM

## 2016-09-28 ENCOUNTER — Telehealth: Payer: Self-pay | Admitting: *Deleted

## 2016-09-28 NOTE — Telephone Encounter (Signed)
Received Physician Orders from [facility]; forwarded to provider/SLS 09/17

## 2016-09-30 ENCOUNTER — Ambulatory Visit
Admission: RE | Admit: 2016-09-30 | Discharge: 2016-09-30 | Disposition: A | Discharge status: Home / Self Care | Payer: Medicare Other | Source: Ambulatory Visit | Attending: Family Medicine | Admitting: Family Medicine

## 2016-09-30 ENCOUNTER — Other Ambulatory Visit: Payer: Self-pay | Admitting: Family Medicine

## 2016-09-30 DIAGNOSIS — R928 Other abnormal and inconclusive findings on diagnostic imaging of breast: Secondary | ICD-10-CM

## 2016-09-30 DIAGNOSIS — N6320 Unspecified lump in the left breast, unspecified quadrant: Secondary | ICD-10-CM

## 2016-10-05 ENCOUNTER — Encounter: Payer: Self-pay | Admitting: Family Medicine

## 2016-10-05 ENCOUNTER — Ambulatory Visit (INDEPENDENT_AMBULATORY_CARE_PROVIDER_SITE_OTHER): Payer: Medicare Other | Admitting: Family Medicine

## 2016-10-05 DIAGNOSIS — M8580 Other specified disorders of bone density and structure, unspecified site: Secondary | ICD-10-CM

## 2016-10-05 DIAGNOSIS — Z23 Encounter for immunization: Secondary | ICD-10-CM | POA: Diagnosis not present

## 2016-10-05 DIAGNOSIS — E559 Vitamin D deficiency, unspecified: Secondary | ICD-10-CM | POA: Diagnosis not present

## 2016-10-05 DIAGNOSIS — I10 Essential (primary) hypertension: Secondary | ICD-10-CM

## 2016-10-05 DIAGNOSIS — E782 Mixed hyperlipidemia: Secondary | ICD-10-CM

## 2016-10-05 LAB — CBC
HCT: 40.1 % (ref 36.0–46.0)
Hemoglobin: 13.2 g/dL (ref 12.0–15.0)
MCHC: 32.8 g/dL (ref 30.0–36.0)
MCV: 87.5 fl (ref 78.0–100.0)
Platelets: 204 10*3/uL (ref 150.0–400.0)
RBC: 4.59 Mil/uL (ref 3.87–5.11)
RDW: 15.7 % — ABNORMAL HIGH (ref 11.5–15.5)
WBC: 5.9 10*3/uL (ref 4.0–10.5)

## 2016-10-05 LAB — COMPREHENSIVE METABOLIC PANEL
ALBUMIN: 3.9 g/dL (ref 3.5–5.2)
ALT: 13 U/L (ref 0–35)
AST: 13 U/L (ref 0–37)
Alkaline Phosphatase: 56 U/L (ref 39–117)
BILIRUBIN TOTAL: 0.4 mg/dL (ref 0.2–1.2)
BUN: 26 mg/dL — ABNORMAL HIGH (ref 6–23)
CALCIUM: 9.3 mg/dL (ref 8.4–10.5)
CO2: 25 meq/L (ref 19–32)
CREATININE: 1.2 mg/dL (ref 0.40–1.20)
Chloride: 108 mEq/L (ref 96–112)
GFR: 56.08 mL/min — ABNORMAL LOW (ref >60.00)
Glucose, Bld: 82 mg/dL (ref 70–99)
Potassium: 4.6 mEq/L (ref 3.5–5.1)
Sodium: 142 mEq/L (ref 135–145)
Total Protein: 7.2 g/dL (ref 6.0–8.3)

## 2016-10-05 LAB — LIPID PANEL
CHOL/HDL RATIO: 5
CHOLESTEROL: 194 mg/dL (ref 0–200)
HDL: 39.2 mg/dL (ref >39.00)
LDL CALC: 138 mg/dL — AB (ref 0–99)
NonHDL: 155.16
TRIGLYCERIDES: 86 mg/dL (ref 0.0–149.0)
VLDL: 17.2 mg/dL (ref 0.0–40.0)

## 2016-10-05 LAB — TSH: TSH: 2.62 u[IU]/mL (ref 0.35–4.50)

## 2016-10-05 LAB — VITAMIN D 25 HYDROXY (VIT D DEFICIENCY, FRACTURES): VITD: 23.77 ng/mL — AB (ref 30.00–100.00)

## 2016-10-05 MED ORDER — ACETAMINOPHEN-CODEINE #3 300-30 MG PO TABS: 1.0000 | ORAL_TABLET | Freq: Two times a day (BID) | ORAL | 0 refills | Status: DC | PRN

## 2016-10-05 NOTE — Assessment & Plan Note (Signed)
Encouraged heart healthy diet, increase exercise, avoid trans fats, consider a krill oil cap daily 

## 2016-10-05 NOTE — Assessment & Plan Note (Signed)
Encouraged to get adequate exercise, calcium and vitamin d intake 

## 2016-10-05 NOTE — Patient Instructions (Addendum)
Take Tylenol/Acetaminophen ES( 500mg ) 2 tabs twice daily (max of 3000 mg of Tylenol in 24 hours or 6 tabs in 24 hours) Shingrix the new shingles shot, 2 shots over 6 months and can get at pharmacy have them send [place] a copy.   Encouraged increased hydration and fiber in diet. Daily probiotics. If bowels not moving can use MOM 2 tbls po in 4 oz of warm prune juice by mouth every 2-3 days. If no results then repeat in 4 hours with  Dulcolax suppository pr, may repeat again in 4 more hours as needed. Seek care if symptoms worsen. Consider daily Miralax and/or Dulcolax if symptoms persist.   Try Miralax mixed with Benefiber daily and can increase to twice daily to stay regular Cholesterol Cholesterol is a fat. Your body needs a small amount of cholesterol. Cholesterol (plaque) may build up in your blood vessels (arteries). That makes you more likely to have a heart attack or stroke. You cannot feel your cholesterol level. Having a blood test is the only way to find out if your level is high. Keep your test results. Work with your doctor to keep your cholesterol at a good level. What do the results mean?  Total cholesterol is how much cholesterol is in your blood.  LDL is bad cholesterol. This is the type that can build up. Try to have low LDL.  HDL is good cholesterol. It cleans your blood vessels and carries LDL away. Try to have high HDL.  Triglycerides are fat that the body can store or burn for energy. What are good levels of cholesterol?  Total cholesterol below 200.  LDL below 100 is good for people who have health risks. LDL below 70 is good for people who have very high risks.  HDL above 40 is good. It is best to have HDL of 60 or higher.  Triglycerides below 150. How can I lower my cholesterol? Diet Follow your diet program as told by your doctor.  Choose fish, white meat chicken, or [place] that is roasted or baked. Try not to eat red meat, fried foods, sausage, or lunch  meats.  Eat lots of fresh fruits and vegetables.  Choose whole grains, beans, pasta, potatoes, and cereals.  Choose olive oil, corn oil, or canola oil. Only use small amounts.  Try not to eat butter, mayonnaise, shortening, or palm kernel oils.  Try not to eat foods with trans fats.  Choose low-fat or nonfat dairy foods. ? Drink skim or nonfat milk. ? Eat low-fat or nonfat yogurt and cheeses. ? Try not to drink whole milk or cream. ? Try not to eat ice cream, egg yolks, or full-fat cheeses.  Healthy desserts include angel food cake, ginger snaps, animal crackers, hard candy, popsicles, and low-fat or nonfat frozen yogurt. Try not to eat pastries, cakes, pies, and cookies.  Exercise Follow your exercise program as told by your doctor.  Be more active. Try gardening, walking, and taking the stairs.  Ask your doctor about ways that you can be more active.  Medicine  Take over-the-counter and prescription medicines only as told by your doctor.  This information is not intended to replace advice given to you by your health care provider. Make sure you discuss any questions you have with your health care provider. Document Released: 03/27/2008 Document Revised: 07/31/2015 Document Reviewed: 07/11/2015 Elsevier Interactive Patient Education  2017 [employer].

## 2016-10-05 NOTE — Progress Notes (Signed)
Subjective:  I acted as a Education administrator for Dr. Charlett Blake. Terri Mckee, [place]  Patient ID: Terri Mckee, female    DOB: 1940/09/15, 76 y.o.   MRN: [id number]  No chief complaint on file.   HPI  Patient is in today for a 6 month follow up. She feels well today. No recent febrile illness or hospitalizations. She denies any polyuria or polydipsia. She has been trying to maintain a heart healthy visit. Denies CP/palp/SOB/HA/congestion/fevers/GI or GU c/o. Taking meds as prescribed  Patient Care Team: [name], MD as PCP - General (Family Medicine) Berle Mull, MD as Consulting Physician (Family Medicine) Irene Shipper, MD as Consulting Physician (Gastroenterology) Bjorn Loser, MD as Consulting Physician (Urology) Druscilla Brownie, MD as Consulting Physician (Dermatology)   Past Medical History:  Diagnosis Date  . 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 Acute upper respiratory infection 04/09/2014  . Arthritis    back  . 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 Cataract   . Cataracts, bilateral 07/25/2015  . Cervical cancer screening 07/07/2012  . Colon polyps   . Elevated serum creatinine    with ACE inhibitor (Normal MRA of renal arteries)  . GERD (gastroesophageal reflux disease)   . History of cyst of breast   . Hoarseness of voice 03/13/2012  . Hypertension   . Medicare annual wellness visit, subsequent 09/02/2014  . Muscle spasm of left lower extremity 03/21/2007   Qualifier: Diagnosis of  By: Wynona Luna   . Osteopenia 08/17/2014  . Preventative health care 04/05/2016  . Skin lesion 07/10/2012  . Urinary incontinence 07/10/2012  . Vitamin D deficiency 04/03/2016    Past Surgical History:  Procedure Laterality Date  . APPENDECTOMY    . BREAST CYST EXCISION Bilateral    right breast, twice  . OVARY SURGERY     right ovary & tube replacement  . WRIST GANGLION EXCISION     rt    Family History  Problem  Relation Age of Onset  . Brain cancer Mother        deceased age 86  . Alcohol abuse Mother   . Other Brother        died of sepsis  . Gout Brother   . Alcohol abuse Father   . Cancer Sister        throat  . Pulmonary embolism Daughter   . Mental illness Daughter        anxiety  . Diabetes Maternal Aunt   . Diabetes Maternal Aunt   . Seizures Maternal Aunt   . Birth defects Maternal Aunt   . Colon cancer Neg Hx   . Breast cancer Neg Hx     Social History   Social History  . Marital status: Married    Spouse name: N/A  . Number of children: N/A  . Years of education: N/A   Occupational History  . Not on file.   Social History Main Topics  . Smoking status: Former Smoker    Quit date: 01/13/1991  . Smokeless tobacco: Never Used     Comment: quit 20 years ago 1/2 ppd x 40 years  . Alcohol use No  . Drug use: No  . Sexual activity: No     Comment: lives with husband, no dietary restrictions,    Other Topics Concern  . Not on file   Social History Narrative   Former Smoker -  quit 20 years ago (1/2 ppd x 40 years)   Widowed   1 Daughter         Outpatient Medications Prior to Visit  Medication Sig Dispense Refill  . acetaminophen (TYLENOL) 500 MG tablet Take 1 tablet (500 mg total) by mouth every 8 (eight) hours as needed. 90 tablet 0  . albuterol (PROVENTIL HFA;VENTOLIN HFA) 108 (90 Base) MCG/ACT inhaler Inhale 2 puffs into the lungs every 6 (six) hours as needed for wheezing or shortness of breath. 1 Inhaler 0  . amLODipine (NORVASC) 10 MG tablet Take 1 tablet (10 mg total) by mouth daily. 90 tablet 1  . aspirin 81 MG tablet Take 81 mg by mouth daily.      . calcium carbonate (OS-CAL) 600 MG TABS Take 600 mg by mouth daily.      . cyclobenzaprine (FLEXERIL) 10 MG tablet Take 1 tablet (10 mg total) by mouth 2 (two) times daily as needed for muscle spasms. 30 tablet 1  . diclofenac sodium (VOLTAREN) 1 % GEL APPLY TOPICALLY 2 GRAMS 3 TIMES DAILY TO AFFECTED AREA AS  NEEDED 100 g 0  . fluticasone (FLONASE) 50 MCG/ACT nasal spray Place 2 sprays into both nostrils daily. 16 g 2  . Garlic 681 MG CAPS Take 1 capsule by mouth daily.    Javier Docker Oil 1000 MG CAPS Take 1,000 capsules by mouth daily.    Marland Kitchen levocetirizine (XYZAL) 5 MG tablet TAKE 1 TABLET (5 MG TOTAL) BY MOUTH EVERY EVENING. 90 tablet 3  . Multiple Vitamin (MULTIVITAMIN) tablet Take 1 tablet by mouth daily.    Marland Kitchen omeprazole (PRILOSEC) 20 MG capsule TAKE 1 TABLET (20 MG TOTAL) BY MOUTH DAILY. 1 CAPSULE BY MOUTH ONCE DAILY 30 MINUTES BEFORE MEALS 30 capsule 5  . ranitidine (ZANTAC) 150 MG tablet Take 1 tablet (150 mg total) by mouth at bedtime. 90 tablet 0  . SYMBICORT 80-4.5 MCG/ACT inhaler INHALE 2 PUFFS INTO THE LUNGS 2 (TWO) TIMES DAILY. 10.2 g 2  . acetaminophen-codeine (TYLENOL #3) 300-30 MG tablet Take 1-2 tablets by mouth 2 (two) times daily as needed for severe pain. 20 tablet 0   No facility-administered medications prior to visit.     Allergies  Allergen Reactions  . Allegra [Fexofenadine] Shortness Of Breath  . Erythromycin Nausea Only  . Oxycodone-Acetaminophen Nausea And Vomiting  . Propoxyphene N-Acetaminophen Nausea And Vomiting      severe vomiting  . Zocor [Simvastatin] Other (See Comments)      Myalgia    Review of Systems  Constitutional: Negative for fever and malaise/fatigue.  HENT: Negative for congestion.   Eyes: Negative for blurred vision.  Respiratory: Negative for shortness of breath.   Cardiovascular: Negative for chest pain, palpitations and leg swelling.  Gastrointestinal: Positive for constipation. Negative for abdominal pain, blood in stool and nausea.  Genitourinary: Negative for dysuria and frequency.  Musculoskeletal: Negative for falls.  Skin: Negative for rash.  Neurological: Negative for dizziness, loss of consciousness and headaches.  Endo/Heme/Allergies: Negative for environmental allergies.  Psychiatric/Behavioral: Negative for depression. The  patient is not nervous/anxious.        Objective:    Physical Exam  Constitutional: She is oriented to person, place, and time. She appears well-developed and well-nourished. No distress.  HENT:  Head: Normocephalic and atraumatic.  Nose: Nose normal.  Eyes: Right eye exhibits no discharge. Left eye exhibits no discharge.  Neck: Normal range of motion. Neck supple.  Cardiovascular: Normal rate and regular rhythm.  No murmur heard. Pulmonary/Chest: Effort normal and breath sounds normal.  Abdominal: Soft. Bowel sounds are normal. There is no tenderness.  Musculoskeletal: She exhibits no edema.  Neurological: She is alert and oriented to person, place, and time.  Skin: Skin is warm and dry.  Psychiatric: She has a normal mood and affect.  Nursing note and vitals reviewed.   BP 120/70 (BP Location: Left Arm, Patient Position: Sitting, Cuff Size: Normal)   Pulse 64   Temp 98.6 F (37 C) (Oral)   Resp 18   Wt 236 lb 12.8 oz (107.4 kg)   SpO2 98%   BMI 33.98 kg/m  Wt Readings from Last 3 Encounters:  10/05/16 236 lb 12.8 oz (107.4 kg)  02/07/16 234 lb 6.4 oz (106.3 kg)  10/01/15 224 lb 2 oz (101.7 kg)   BP Readings from Last 3 Encounters:  10/05/16 120/70  02/07/16 120/64  10/01/15 102/68     Immunization History  Administered Date(s) Administered  . Pneumococcal Conjugate-13 09/21/2013  . Pneumococcal Polysaccharide-23 10/31/2010, 10/05/2016  . Td 07/25/2015    Health Maintenance  Topic Date Due  . COLONOSCOPY  10/05/2016  . INFLUENZA VACCINE  03/14/2018 (Originally 08/12/2016)  . TETANUS/TDAP  07/24/2025  . DEXA SCAN  Completed  . PNA vac Low Risk Adult  Completed    Lab Results  Component Value Date   WBC 5.9 10/05/2016   HGB 13.2 10/05/2016   HCT 40.1 10/05/2016   PLT 204.0 10/05/2016   GLUCOSE 82 10/05/2016   CHOL 194 10/05/2016   TRIG 86.0 10/05/2016   HDL 39.20 10/05/2016   LDLCALC 138 (H) 10/05/2016   ALT 13 10/05/2016   AST 13 10/05/2016   NA  142 10/05/2016   K 4.6 10/05/2016   CL 108 10/05/2016   CREATININE 1.20 10/05/2016   BUN 26 (H) 10/05/2016   CO2 25 10/05/2016   TSH 2.62 10/05/2016    Lab Results  Component Value Date   TSH 2.62 10/05/2016   Lab Results  Component Value Date   WBC 5.9 10/05/2016   HGB 13.2 10/05/2016   HCT 40.1 10/05/2016   MCV 87.5 10/05/2016   PLT 204.0 10/05/2016   Lab Results  Component Value Date   NA 142 10/05/2016   K 4.6 10/05/2016   CO2 25 10/05/2016   GLUCOSE 82 10/05/2016   BUN 26 (H) 10/05/2016   CREATININE 1.20 10/05/2016   BILITOT 0.4 10/05/2016   ALKPHOS 56 10/05/2016   AST 13 10/05/2016   ALT 13 10/05/2016   PROT 7.2 10/05/2016   ALBUMIN 3.9 10/05/2016   CALCIUM 9.3 10/05/2016   ANIONGAP 11 05/07/2015   GFR 56.08 (L) 10/05/2016   Lab Results  Component Value Date   CHOL 194 10/05/2016   Lab Results  Component Value Date   HDL 39.20 10/05/2016   Lab Results  Component Value Date   LDLCALC 138 (H) 10/05/2016   Lab Results  Component Value Date   TRIG 86.0 10/05/2016   Lab Results  Component Value Date   CHOLHDL 5 10/05/2016   No results found for: HGBA1C       Assessment & Plan:   Problem List Items Addressed This Visit    Hyperlipidemia, mixed    Encouraged heart healthy diet, increase exercise, avoid trans fats, consider a krill oil cap daily.       Relevant Orders   Lipid panel (Completed)   Essential hypertension    Well controlled, no changes to meds. Encouraged heart healthy  diet such as the DASH diet and exercise as tolerated.       Relevant Orders   CBC (Completed)   Comprehensive metabolic panel (Completed)   TSH (Completed)   Osteopenia    Encouraged to get adequate exercise, calcium and vitamin d intake      Vitamin D deficiency    Encouraged to get adequate exercise, calcium and vitamin d intake      Relevant Orders   VITAMIN D 25 Hydroxy (Vit-D Deficiency, Fractures) (Completed)      I am having Ms. Delon  start on Vitamin D (Ergocalciferol). I am also having her maintain her aspirin, calcium carbonate, Garlic, multivitamin, SYMBICORT, omeprazole, acetaminophen, fluticasone, cyclobenzaprine, Krill Oil, albuterol, levocetirizine, diclofenac sodium, amLODipine, ranitidine, and acetaminophen-codeine.  Meds ordered this encounter  Medications  . acetaminophen-codeine (TYLENOL #3) 300-30 MG tablet    Sig: Take 1-2 tablets by mouth 2 (two) times daily as needed for severe pain.    Dispense:  30 tablet    Refill:  0  . Vitamin D, Ergocalciferol, (DRISDOL) 50000 units CAPS capsule    Sig: Take 1 capsule (50,000 Units total) by mouth every 7 (seven) days.    Dispense:  4 capsule    Refill:  4    CMA served as scribe during this visit. History, Physical and Plan performed by medical provider. Documentation and orders reviewed and attested to.  Penni Homans, MD

## 2016-10-05 NOTE — Assessment & Plan Note (Signed)
Well controlled, no changes to meds. Encouraged heart healthy diet such as the DASH diet and exercise as tolerated.  °

## 2016-10-06 MED ORDER — VITAMIN D (ERGOCALCIFEROL) 1.25 MG (50000 UNIT) PO CAPS: 50000.0000 [IU] | ORAL_CAPSULE | ORAL | 4 refills | Status: DC

## 2016-10-19 ENCOUNTER — Encounter: Payer: Self-pay | Admitting: Internal Medicine

## 2016-11-09 ENCOUNTER — Ambulatory Visit (INDEPENDENT_AMBULATORY_CARE_PROVIDER_SITE_OTHER): Payer: Medicare Other | Admitting: Medical

## 2016-11-09 ENCOUNTER — Encounter: Payer: Self-pay | Admitting: Medical

## 2016-11-09 VITALS — BP 136/77 | HR 74 | Temp 97.8°F | Resp 16 | Ht 70.0 in | Wt 237.0 lb

## 2016-11-09 DIAGNOSIS — J40 Bronchitis, not specified as acute or chronic: Secondary | ICD-10-CM

## 2016-11-09 MED ORDER — DOXYCYCLINE HYCLATE 100 MG PO TABS: 100.0000 mg | ORAL_TABLET | Freq: Two times a day (BID) | ORAL | 0 refills | Status: DC

## 2016-11-09 MED ORDER — GUAIFENESIN-CODEINE 100-10 MG/5ML PO SOLN: 5.0000 mL | Freq: Four times a day (QID) | ORAL | 0 refills | Status: DC | PRN

## 2016-11-09 NOTE — Patient Instructions (Signed)
You appear to have bronchitis. Rest hydrate and tylenol for fever. I am prescribing cough medicine robitussin with codeine, and doxycycline antibiotic.  For wheezing use your symbicort and albuterol  You should gradually get better. If not then notify us and would recommend a chest xray.  Follow up in 7-10 days or as needed

## 2016-11-09 NOTE — Progress Notes (Signed)
Subjective:    Patient ID: Terri Mckee, female    DOB: [birth year], 76 y.o.   MRN: [id number]  HPI  Pt in with one week of nasal and chest congestion. Pt states cough keeping her up at night and yellow mucous when she coughs. No fever, no chills or sweats. Pt is wheezing occasional at night.  Pt last year got rx symbicort and albuterol. She states she uses neither daily basis. Usually only when she gets sick about 1-2 times a year.     Review of Systems  Constitutional: Negative for chills, fatigue and fever.  HENT: Positive for congestion. Negative for postnasal drip, sinus pain, sinus pressure, sneezing, sore throat and trouble swallowing.   Respiratory: Positive for cough and wheezing. Negative for chest tightness and shortness of breath.   Cardiovascular: Negative for chest pain and palpitations.  Gastrointestinal: Negative for abdominal pain, diarrhea and nausea.  Musculoskeletal: Negative for back pain, joint swelling and neck pain.  Skin: Negative for rash.  Neurological: Negative for dizziness, weakness, numbness and headaches.  Hematological: Negative for adenopathy. Does not bruise/bleed easily.  Psychiatric/Behavioral: Negative for behavioral problems and confusion. The patient is not nervous/anxious.    Past Medical History:  Diagnosis Date  . 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 Acute upper respiratory infection 04/09/2014  . Arthritis    back  . 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 Cataract   . Cataracts, bilateral 07/25/2015  . Cervical cancer screening 07/07/2012  . Colon polyps   . Elevated serum creatinine    with ACE inhibitor (Normal MRA of renal arteries)  . GERD (gastroesophageal reflux disease)   . History of cyst of breast   . Hoarseness of voice 03/13/2012  . Hypertension   . Medicare annual wellness visit, subsequent 09/02/2014  . Muscle spasm of left lower extremity 03/21/2007   Qualifier: Diagnosis of  By: Wynona Luna   . Osteopenia 08/17/2014  . Preventative health care 04/05/2016  . Skin lesion 07/10/2012  . Urinary incontinence 07/10/2012  . Vitamin D deficiency 04/03/2016     Social History   Social History  . Marital status: Married    Spouse name: N/A  . Number of children: N/A  . Years of education: N/A   Occupational History  . Not on file.   Social History Main Topics  . Smoking status: Former Smoker    Quit date: 01/13/1991  . Smokeless tobacco: Never Used     Comment: quit 20 years ago 1/2 ppd x 40 years  . Alcohol use No  . Drug use: No  . Sexual activity: No     Comment: lives with husband, no dietary restrictions,    Other Topics Concern  . Not on file   Social History Narrative   Former Smoker - quit 20 years ago (1/2 ppd x 40 years)   Widowed   1 Daughter         Past Surgical History:  Procedure Laterality Date  . APPENDECTOMY    . BREAST CYST EXCISION Bilateral    right breast, twice  . OVARY SURGERY     right ovary & tube replacement  . WRIST GANGLION EXCISION     rt    Family History  Problem Relation Age of Onset  . Brain cancer Mother        deceased age 52  . Alcohol abuse Mother   .  Other Brother        died of sepsis  . Gout Brother   . Alcohol abuse Father   . Cancer Sister        throat  . Pulmonary embolism Daughter   . Mental illness Daughter        anxiety  . Diabetes Maternal Aunt   . Diabetes Maternal Aunt   . Seizures Maternal Aunt   . Birth defects Maternal Aunt   . Colon cancer Neg Hx   . Breast cancer Neg Hx     Allergies  Allergen Reactions  . Allegra [Fexofenadine] Shortness Of Breath  . Erythromycin Nausea Only  . Oxycodone-Acetaminophen Nausea And Vomiting  . Propoxyphene N-Acetaminophen Nausea And Vomiting      severe vomiting  . Zocor [Simvastatin] Other (See Comments)      Myalgia    Current Outpatient Prescriptions on File Prior to Visit  Medication Sig Dispense  Refill  . acetaminophen (TYLENOL) 500 MG tablet Take 1 tablet (500 mg total) by mouth every 8 (eight) hours as needed. 90 tablet 0  . acetaminophen-codeine (TYLENOL #3) 300-30 MG tablet Take 1-2 tablets by mouth 2 (two) times daily as needed for severe pain. 30 tablet 0  . albuterol (PROVENTIL HFA;VENTOLIN HFA) 108 (90 Base) MCG/ACT inhaler Inhale 2 puffs into the lungs every 6 (six) hours as needed for wheezing or shortness of breath. 1 Inhaler 0  . amLODipine (NORVASC) 10 MG tablet Take 1 tablet (10 mg total) by mouth daily. 90 tablet 1  . aspirin 81 MG tablet Take 81 mg by mouth daily.      . calcium carbonate (OS-CAL) 600 MG TABS Take 600 mg by mouth daily.      . cyclobenzaprine (FLEXERIL) 10 MG tablet Take 1 tablet (10 mg total) by mouth 2 (two) times daily as needed for muscle spasms. 30 tablet 1  . diclofenac sodium (VOLTAREN) 1 % GEL APPLY TOPICALLY 2 GRAMS 3 TIMES DAILY TO AFFECTED AREA AS NEEDED 100 g 0  . fluticasone (FLONASE) 50 MCG/ACT nasal spray Place 2 sprays into both nostrils daily. 16 g 2  . Garlic 834 MG CAPS Take 1 capsule by mouth daily.    Javier Docker Oil 1000 MG CAPS Take 1,000 capsules by mouth daily.    Marland Kitchen levocetirizine (XYZAL) 5 MG tablet TAKE 1 TABLET (5 MG TOTAL) BY MOUTH EVERY EVENING. 90 tablet 3  . Multiple Vitamin (MULTIVITAMIN) tablet Take 1 tablet by mouth daily.    Marland Kitchen omeprazole (PRILOSEC) 20 MG capsule TAKE 1 TABLET (20 MG TOTAL) BY MOUTH DAILY. 1 CAPSULE BY MOUTH ONCE DAILY 30 MINUTES BEFORE MEALS 30 capsule 5  . ranitidine (ZANTAC) 150 MG tablet Take 1 tablet (150 mg total) by mouth at bedtime. 90 tablet 0  . SYMBICORT 80-4.5 MCG/ACT inhaler INHALE 2 PUFFS INTO THE LUNGS 2 (TWO) TIMES DAILY. 10.2 g 2  . Vitamin D, Ergocalciferol, (DRISDOL) 50000 units CAPS capsule Take 1 capsule (50,000 Units total) by mouth every 7 (seven) days. 4 capsule 4   No current facility-administered medications on file prior to visit.     BP (!) 136/48   Pulse 74   Temp 97.8 F  (36.6 C) (Oral)   Resp 16   Ht 5\' 10"  (1.778 m)   Wt 237 lb (107.5 kg)   SpO2 100%   BMI 34.01 kg/m       Objective:   Physical Exam  General  Mental Status - Alert. General Appearance - Well  groomed. Not in acute distress.  Skin Rashes- No Rashes.  HEENT Head- Normal. Ear Auditory Canal - Left- Normal. Right - Normal.Tympanic Membrane- Left- Normal. Right- Normal. Eye Sclera/Conjunctiva- Left- Normal. Right- Normal. Nose & Sinuses Nasal Mucosa- Left-  Boggy and Congested. Right-  Boggy and  Congested.Bilateral no  maxillary and no  frontal sinus pressure. Mouth & Throat Lips: Upper Lip- Normal: no dryness, cracking, pallor, cyanosis, or vesicular eruption. Lower Lip-Normal: no dryness, cracking, pallor, cyanosis or vesicular eruption. Buccal Mucosa- Bilateral- No Aphthous ulcers. Oropharynx- No Discharge or Erythema. Tonsils: Characteristics- Bilateral- No Erythema or Congestion. Size/Enlargement- Bilateral- No enlargement. Discharge- bilateral-None.  Neck Neck- Supple. No Masses.   Chest and Lung Exam Auscultation: Breath Sounds:-Clear even and unlabored.  Cardiovascular Auscultation:Rythm- Regular, rate and rhythm. Murmurs & Other Heart Sounds:Ausculatation of the heart reveal- No Murmurs.  Lymphatic Head & Neck General Head & Neck Lymphatics: Bilateral: Description- No Localized lymphadenopathy.    Neurologic Cranial Nerve exam:- CN III-XII intact(No nystagmus), symmetric smile. Strength:- 5/5 equal and symmetric strength both upper and lower extremities.      Assessment & Plan:  You appear to have bronchitis. Rest hydrate and tylenol for fever. I am prescribing cough medicine robitussin with codeine, and doxycycline antibiotic.  For wheezing use your symbicort and albuterol  You should gradually get better. If not then notify us and would recommend a chest xray.  Follow up in 7-10 days or as needed  Natashia Roseman, Percell Miller, [employer]

## 2016-11-11 ENCOUNTER — Telehealth: Payer: Self-pay | Admitting: Family Medicine

## 2016-11-11 NOTE — Telephone Encounter (Signed)
Pt dropped off document to be filled out provider (Disability Parking Placard-1 page). Pt would like to be called at [phone] when ready to pick up. Document put at front office tray.

## 2016-11-13 NOTE — Telephone Encounter (Signed)
Received Application and Renewal of Disability Parking Placard form [place] [employer]; forward to provider/SLS 11/02

## 2016-11-20 ENCOUNTER — Other Ambulatory Visit: Payer: Self-pay | Admitting: Family Medicine

## 2017-01-02 ENCOUNTER — Other Ambulatory Visit: Payer: Self-pay | Admitting: Family Medicine

## 2017-01-13 ENCOUNTER — Ambulatory Visit: Payer: Medicare Other | Admitting: Family Medicine

## 2017-01-13 ENCOUNTER — Encounter: Payer: Self-pay | Admitting: Family Medicine

## 2017-01-13 VITALS — BP 122/78 | HR 72 | Temp 98.0°F | Ht 70.0 in | Wt 230.5 lb

## 2017-01-13 DIAGNOSIS — J441 Chronic obstructive pulmonary disease with (acute) exacerbation: Secondary | ICD-10-CM

## 2017-01-13 MED ORDER — ALBUTEROL SULFATE HFA 108 (90 BASE) MCG/ACT IN AERS: 2.0000 | INHALATION_SPRAY | Freq: Four times a day (QID) | RESPIRATORY_TRACT | 0 refills | Status: DC | PRN

## 2017-01-13 MED ORDER — BUDESONIDE-FORMOTEROL FUMARATE 80-4.5 MCG/ACT IN AERO: INHALATION_SPRAY | RESPIRATORY_TRACT | 2 refills | Status: DC

## 2017-01-13 MED ORDER — PREDNISONE 20 MG PO TABS: 40.0000 mg | ORAL_TABLET | Freq: Every day | ORAL | 0 refills | Status: AC

## 2017-01-13 NOTE — Progress Notes (Signed)
Pre visit review using our clinic review tool, if applicable. No additional management support is needed unless otherwise documented below in the visit note. 

## 2017-01-13 NOTE — Progress Notes (Addendum)
Chief Complaint  Patient presents with  . Cough  . Nasal Congestion    Terri Mckee here for URI complaints.  Duration: 4 days  Associated symptoms: sinus congestion, rhinorrhea, wheezing, shortness of breath and cough Denies: sinus pain, itchy watery eyes, ear pain, ear drainage, sore throat, myalgia and fevers/rigors Treatment to date: [place] Sick contacts: No  ROS:  Const: Denies fevers HEENT: As noted in HPI Lungs: +cough  Past Medical History:  Diagnosis Date  . 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 Acute upper respiratory infection 04/09/2014  . Arthritis    back  . 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 Cataract   . Cataracts, bilateral 07/25/2015  . Cervical cancer screening 07/07/2012  . Colon polyps   . Elevated serum creatinine    with ACE inhibitor (Normal MRA of renal arteries)  . GERD (gastroesophageal reflux disease)   . History of cyst of breast   . Hoarseness of voice 03/13/2012  . Hypertension   . Medicare annual wellness visit, subsequent 09/02/2014  . Muscle spasm of left lower extremity 03/21/2007   Qualifier: Diagnosis of  By: Wynona Luna   . Osteopenia 08/17/2014  . Preventative health care 04/05/2016  . Skin lesion 07/10/2012  . Urinary incontinence 07/10/2012  . Vitamin D deficiency 04/03/2016   Family History  Problem Relation Age of Onset  . Brain cancer Mother        deceased age 61  . Alcohol abuse Mother   . Other Brother        died of sepsis  . Gout Brother   . Alcohol abuse Father   . Cancer Sister        throat  . Pulmonary embolism Daughter   . Mental illness Daughter        anxiety  . Diabetes Maternal Aunt   . Diabetes Maternal Aunt   . Seizures Maternal Aunt   . Birth defects Maternal Aunt   . Colon cancer Neg Hx   . Breast cancer Neg Hx     BP 122/78 (BP Location: Left Arm, Patient Position: Sitting, Cuff Size: Large)   Pulse 72   Temp 98 F (36.7  C) (Oral)   Ht 5\' 10"  (1.778 m)   Wt 230 lb 8 oz (104.6 kg)   SpO2 97%   BMI 33.07 kg/m  General: Awake, alert, appears stated age HEENT: AT, , ears patent b/l and TM's neg, nares patent w/o discharge, pharynx pink and without exudates, MMM Neck: No masses or asymmetry Heart: RRR, no murmurs, no bruits Lungs: +diffuse wheezes, no accessory muscle use Psych: Age appropriate judgment and insight, normal mood and affect  COPD exacerbation (HCC) - Plan: budesonide-formoterol (SYMBICORT) 80-4.5 MCG/ACT inhaler, albuterol (PROVENTIL HFA;VENTOLIN HFA) 108 (90 Base) MCG/ACT inhaler, predniSONE (DELTASONE) 20 MG tablet  Orders as above. Take Symbicort daily. Albuterol for rescue.  Continue to push fluids, practice good hand hygiene, cover mouth when coughing. F/u prn. If starting to experience fevers, shaking, or shortness of breath, seek immediate care. Pt voiced understanding and agreement to the plan.  Haivana Nakya, DO 01/13/17 1:15 PM

## 2017-01-13 NOTE — Patient Instructions (Addendum)
Continue to push fluids, practice good hand hygiene, and cover your mouth if you cough.  If you start having fevers, shaking or shortness of breath, seek immediate care.  Start Flonase.  Let us know if you need anything.

## 2017-02-18 ENCOUNTER — Other Ambulatory Visit: Payer: Self-pay | Admitting: Family Medicine

## 2017-02-25 ENCOUNTER — Other Ambulatory Visit: Payer: Self-pay

## 2017-02-25 MED ORDER — RANITIDINE HCL 150 MG PO TABS: 150.0000 mg | ORAL_TABLET | Freq: Every day | ORAL | 0 refills | Status: DC

## 2017-03-31 ENCOUNTER — Other Ambulatory Visit: Payer: Medicare Other

## 2017-04-12 ENCOUNTER — Encounter: Payer: Medicare Other | Admitting: Family Medicine

## 2017-04-13 ENCOUNTER — Ambulatory Visit
Admission: RE | Admit: 2017-04-13 | Discharge: 2017-04-13 | Disposition: A | Discharge status: Home / Self Care | Payer: Medicare Other | Source: Ambulatory Visit | Attending: Family Medicine | Admitting: Family Medicine

## 2017-04-13 DIAGNOSIS — N6320 Unspecified lump in the left breast, unspecified quadrant: Secondary | ICD-10-CM

## 2017-04-19 ENCOUNTER — Encounter: Payer: Self-pay | Admitting: Family Medicine

## 2017-05-02 ENCOUNTER — Other Ambulatory Visit: Payer: Self-pay | Admitting: Family Medicine

## 2017-06-14 ENCOUNTER — Ambulatory Visit: Payer: Medicare Other | Admitting: Family Medicine

## 2017-06-14 ENCOUNTER — Ambulatory Visit (HOSPITAL_BASED_OUTPATIENT_CLINIC_OR_DEPARTMENT_OTHER)
Admission: RE | Admit: 2017-06-14 | Discharge: 2017-06-14 | Disposition: A | Discharge status: Home / Self Care | Payer: Medicare Other | Source: Ambulatory Visit | Attending: Family Medicine | Admitting: Family Medicine

## 2017-06-14 ENCOUNTER — Encounter: Payer: Self-pay | Admitting: Family Medicine

## 2017-06-14 ENCOUNTER — Other Ambulatory Visit: Payer: Self-pay | Admitting: Family Medicine

## 2017-06-14 VITALS — BP 132/72 | HR 65 | Temp 98.1°F | Resp 18 | Wt 229.8 lb

## 2017-06-14 DIAGNOSIS — M25551 Pain in right hip: Secondary | ICD-10-CM

## 2017-06-14 DIAGNOSIS — M25461 Effusion, right knee: Secondary | ICD-10-CM | POA: Insufficient documentation

## 2017-06-14 DIAGNOSIS — M1611 Unilateral primary osteoarthritis, right hip: Secondary | ICD-10-CM | POA: Insufficient documentation

## 2017-06-14 DIAGNOSIS — D259 Leiomyoma of uterus, unspecified: Secondary | ICD-10-CM | POA: Insufficient documentation

## 2017-06-14 DIAGNOSIS — R252 Cramp and spasm: Secondary | ICD-10-CM | POA: Diagnosis not present

## 2017-06-14 DIAGNOSIS — I10 Essential (primary) hypertension: Secondary | ICD-10-CM | POA: Diagnosis not present

## 2017-06-14 DIAGNOSIS — E782 Mixed hyperlipidemia: Secondary | ICD-10-CM | POA: Diagnosis not present

## 2017-06-14 DIAGNOSIS — M79651 Pain in right thigh: Secondary | ICD-10-CM

## 2017-06-14 DIAGNOSIS — E559 Vitamin D deficiency, unspecified: Secondary | ICD-10-CM

## 2017-06-14 DIAGNOSIS — M8580 Other specified disorders of bone density and structure, unspecified site: Secondary | ICD-10-CM

## 2017-06-14 LAB — COMPREHENSIVE METABOLIC PANEL
ALT: 10 U/L (ref 0–35)
AST: 10 U/L (ref 0–37)
Albumin: 3.8 g/dL (ref 3.5–5.2)
Alkaline Phosphatase: 58 U/L (ref 39–117)
BUN: 19 mg/dL (ref 6–23)
CALCIUM: 9.1 mg/dL (ref 8.4–10.5)
CHLORIDE: 106 meq/L (ref 96–112)
CO2: 28 mEq/L (ref 19–32)
Creatinine, Ser: 1.19 mg/dL (ref 0.40–1.20)
GFR: 56.52 mL/min — ABNORMAL LOW (ref >60.00)
Glucose, Bld: 82 mg/dL (ref 70–99)
POTASSIUM: 3.8 meq/L (ref 3.5–5.1)
Sodium: 142 mEq/L (ref 135–145)
Total Bilirubin: 0.4 mg/dL (ref 0.2–1.2)
Total Protein: 6.6 g/dL (ref 6.0–8.3)

## 2017-06-14 LAB — LIPID PANEL
CHOLESTEROL: 191 mg/dL (ref 0–200)
HDL: 37.9 mg/dL — AB (ref >39.00)
LDL CALC: 134 mg/dL — AB (ref 0–99)
NonHDL: 152.7
Total CHOL/HDL Ratio: 5
Triglycerides: 95 mg/dL (ref 0.0–149.0)
VLDL: 19 mg/dL (ref 0.0–40.0)

## 2017-06-14 LAB — CBC
HEMATOCRIT: 39.5 % (ref 36.0–46.0)
HEMOGLOBIN: 13.1 g/dL (ref 12.0–15.0)
MCHC: 33.2 g/dL (ref 30.0–36.0)
MCV: 86.8 fl (ref 78.0–100.0)
Platelets: 225 10*3/uL (ref 150.0–400.0)
RBC: 4.55 Mil/uL (ref 3.87–5.11)
RDW: 15 % (ref 11.5–15.5)
WBC: 5.9 10*3/uL (ref 4.0–10.5)

## 2017-06-14 LAB — CK: Total CK: 151 U/L (ref 7–177)

## 2017-06-14 LAB — VITAMIN D 25 HYDROXY (VIT D DEFICIENCY, FRACTURES): VITD: 44.23 ng/mL (ref 30.00–100.00)

## 2017-06-14 LAB — TSH: TSH: 3.43 u[IU]/mL (ref 0.35–4.50)

## 2017-06-14 LAB — MAGNESIUM: Magnesium: 2.1 mg/dL (ref 1.5–2.5)

## 2017-06-14 MED ORDER — OMEPRAZOLE 20 MG PO CPDR: DELAYED_RELEASE_CAPSULE | ORAL | 5 refills | Status: DC

## 2017-06-14 MED ORDER — OMEPRAZOLE 20 MG PO CPDR: 20.0000 mg | DELAYED_RELEASE_CAPSULE | Freq: Every day | ORAL | 5 refills | Status: DC | PRN

## 2017-06-14 MED ORDER — AMLODIPINE BESYLATE 10 MG PO TABS: 10.0000 mg | ORAL_TABLET | Freq: Every day | ORAL | 1 refills | Status: DC

## 2017-06-14 MED ORDER — LEVOCETIRIZINE DIHYDROCHLORIDE 5 MG PO TABS: 5.0000 mg | ORAL_TABLET | Freq: Every evening | ORAL | 3 refills | Status: DC

## 2017-06-14 NOTE — Assessment & Plan Note (Signed)
Encouraged heart healthy diet, increase exercise, avoid trans fats, consider a krill oil cap daily 

## 2017-06-14 NOTE — Assessment & Plan Note (Signed)
Check a magnesium, try Hyland's leg cramp med

## 2017-06-14 NOTE — Patient Instructions (Addendum)
Hyland's leg cramp medicine Hypertension Hypertension, commonly called high blood pressure, is when the force of blood pumping through the arteries is too strong. The arteries are the blood vessels that carry blood from the heart throughout the body. Hypertension forces the heart to work harder to pump blood and may cause arteries to become narrow or stiff. Having untreated or uncontrolled hypertension can cause heart attacks, strokes, kidney disease, and other problems. A blood pressure reading consists of a higher number over a lower number. Ideally, your blood pressure should be below 120/80. The first ("top") number is called the systolic pressure. It is a measure of the pressure in your arteries as your heart beats. The second ("bottom") number is called the diastolic pressure. It is a measure of the pressure in your arteries as the heart relaxes. What are the causes? The cause of this condition is not known. What increases the risk? Some risk factors for high blood pressure are under your control. Others are not. Factors you can change  Smoking.  Having type 2 diabetes mellitus, high cholesterol, or both.  Not getting enough exercise or physical activity.  Being overweight.  Having too much fat, sugar, calories, or salt (sodium) in your diet.  Drinking too much alcohol. Factors that are difficult or impossible to change  Having chronic kidney disease.  Having a family history of high blood pressure.  Age. Risk increases with age.  Race. You may be at higher risk if you are African-American.  Gender. Men are at higher risk than women before age 80. After age 18, women are at higher risk than men.  Having obstructive sleep apnea.  Stress. What are the signs or symptoms? Extremely high blood pressure (hypertensive crisis) may cause:  Headache.  Anxiety.  Shortness of breath.  Nosebleed.  Nausea and vomiting.  Severe chest pain.  Jerky movements you cannot  control (seizures).  How is this diagnosed? This condition is diagnosed by measuring your blood pressure while you are seated, with your arm resting on a surface. The cuff of the blood pressure monitor will be placed directly against the skin of your upper arm at the level of your heart. It should be measured at least twice using the same arm. Certain conditions can cause a difference in blood pressure between your right and left arms. Certain factors can cause blood pressure readings to be lower or higher than normal (elevated) for a short period of time:  When your blood pressure is higher when you are in a health care provider's office than when you are at home, this is called white coat hypertension. Most people with this condition do not need medicines.  When your blood pressure is higher at home than when you are in a health care provider's office, this is called masked hypertension. Most people with this condition may need medicines to control blood pressure.  If you have a high blood pressure reading during one visit or you have normal blood pressure with other risk factors:  You may be asked to return on a different day to have your blood pressure checked again.  You may be asked to monitor your blood pressure at home for 1 week or longer.  If you are diagnosed with hypertension, you may have other blood or imaging tests to help your health care provider understand your overall risk for other conditions. How is this treated? This condition is treated by making healthy lifestyle changes, such as eating healthy foods, exercising more, and reducing  your alcohol intake. Your health care provider may prescribe medicine if lifestyle changes are not enough to get your blood pressure under control, and if:  Your systolic blood pressure is above 130.  Your diastolic blood pressure is above 80.  Your personal target blood pressure may vary depending on your medical conditions, your age, and  other factors. Follow these instructions at home: Eating and drinking  Eat a diet that is high in fiber and potassium, and low in sodium, added sugar, and fat. An example eating plan is called the DASH (Dietary Approaches to Stop Hypertension) diet. To eat this way: ? Eat plenty of fresh fruits and vegetables. Try to fill half of your plate at each meal with fruits and vegetables. ? Eat whole grains, such as whole wheat pasta, brown rice, or whole grain bread. Fill about one quarter of your plate with whole grains. ? Eat or drink low-fat dairy products, such as skim milk or low-fat yogurt. ? Avoid fatty cuts of meat, processed or cured meats, and poultry with skin. Fill about one quarter of your plate with lean proteins, such as fish, chicken without skin, beans, eggs, and tofu. ? Avoid premade and processed foods. These tend to be higher in sodium, added sugar, and fat.  Reduce your daily sodium intake. Most people with hypertension should eat less than 1,500 mg of sodium a day.  Limit alcohol intake to no more than 1 drink a day for nonpregnant women and 2 drinks a day for men. One drink equals 12 oz of beer, 5 oz of wine, or 1 oz of hard liquor. Lifestyle  Work with your health care provider to maintain a healthy body weight or to lose weight. Ask what an ideal weight is for you.  Get at least 30 minutes of exercise that causes your heart to beat faster (aerobic exercise) most days of the week. Activities may include walking, swimming, or biking.  Include exercise to strengthen your muscles (resistance exercise), such as pilates or lifting weights, as part of your weekly exercise routine. Try to do these types of exercises for 30 minutes at least 3 days a week.  Do not use any products that contain nicotine or tobacco, such as cigarettes and e-cigarettes. If you need help quitting, ask your health care provider.  Monitor your blood pressure at home as told by your health care  provider.  Keep all follow-up visits as told by your health care provider. This is important. Medicines  Take over-the-counter and prescription medicines only as told by your health care provider. Follow directions carefully. Blood pressure medicines must be taken as prescribed.  Do not skip doses of blood pressure medicine. Doing this puts you at risk for problems and can make the medicine less effective.  Ask your health care provider about side effects or reactions to medicines that you should watch for. Contact a health care provider if:  You think you are having a reaction to a medicine you are taking.  You have headaches that keep coming back (recurring).  You feel dizzy.  You have swelling in your ankles.  You have trouble with your vision. Get help right away if:  You develop a severe headache or confusion.  You have unusual weakness or numbness.  You feel faint.  You have severe pain in your chest or abdomen.  You vomit repeatedly.  You have trouble breathing. Summary  Hypertension is when the force of blood pumping through your arteries is too strong. If  this condition is not controlled, it may put you at risk for serious complications.  Your personal target blood pressure may vary depending on your medical conditions, your age, and other factors. For most people, a normal blood pressure is less than 120/80.  Hypertension is treated with lifestyle changes, medicines, or a combination of both. Lifestyle changes include weight loss, eating a healthy, low-sodium diet, exercising more, and limiting alcohol. This information is not intended to replace advice given to you by your health care provider. Make sure you discuss any questions you have with your health care provider. Document Released: 12/29/2004 Document Revised: 11/27/2015 Document Reviewed: 11/27/2015 Elsevier Interactive Patient Education  [employer].

## 2017-06-14 NOTE — Assessment & Plan Note (Signed)
Well controlled, no changes to meds. Encouraged heart healthy diet such as the DASH diet and exercise as tolerated.  °

## 2017-06-14 NOTE — Progress Notes (Signed)
Subjective:  I acted as a Education administrator for Dr. Charlett Blake. [place], [place]  Patient ID: Terri Mckee, female    DOB: Oct 15, 1940, 77 y.o.   MRN: [id number]  No chief complaint on file.   HPI  Patient is in today for a follow up visit and her biggest concern is her pain and weakness in her right hip and leg. Symptoms radiate to knee if night knee. Denies CP/palp/SOB/HA/congestion/fevers/GI or GU c/o. Taking meds as prescribed. No polyuria or polydipsia. No recent fall or accidents.   Patient Care Team: [name], MD as PCP - General (Family Medicine) Berle Mull, MD as Consulting Physician (Family Medicine) Irene Shipper, MD as Consulting Physician (Gastroenterology) Bjorn Loser, MD as Consulting Physician (Urology) Druscilla Brownie, MD as Consulting Physician (Dermatology)   Past Medical History:  Diagnosis Date  . 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 Acute upper respiratory infection 04/09/2014  . Arthritis    back  . 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 Cataract   . Cataracts, bilateral 07/25/2015  . Cervical cancer screening 07/07/2012  . Colon polyps   . Elevated serum creatinine    with ACE inhibitor (Normal MRA of renal arteries)  . GERD (gastroesophageal reflux disease)   . History of cyst of breast   . Hoarseness of voice 03/13/2012  . Hypertension   . Medicare annual wellness visit, subsequent 09/02/2014  . Muscle spasm of left lower extremity 03/21/2007   Qualifier: Diagnosis of  By: Wynona Luna   . Osteopenia 08/17/2014  . Preventative health care 04/05/2016  . Skin lesion 07/10/2012  . Urinary incontinence 07/10/2012  . Vitamin D deficiency 04/03/2016    Past Surgical History:  Procedure Laterality Date  . APPENDECTOMY    . BREAST CYST EXCISION Bilateral    right breast, twice  . OVARY SURGERY     right ovary & tube replacement  . WRIST GANGLION EXCISION     rt    Family History    Problem Relation Age of Onset  . Brain cancer Mother        deceased age 13  . Alcohol abuse Mother   . Other Brother        died of sepsis  . Gout Brother   . Alcohol abuse Father   . Cancer Sister        throat  . Pulmonary embolism Daughter   . Mental illness Daughter        anxiety  . Diabetes Maternal Aunt   . Diabetes Maternal Aunt   . Seizures Maternal Aunt   . Birth defects Maternal Aunt   . Colon cancer Neg Hx   . Breast cancer Neg Hx     Social History   Socioeconomic History  . Marital status: Married    Spouse name: Not on file  . Number of children: Not on file  . Years of education: Not on file  . Highest education level: Not on file  Occupational History  . Not on file  Social Needs  . Financial resource strain: Not on file  . Food insecurity:    Worry: Not on file    Inability: Not on file  . Transportation needs:    Medical: Not on file    Non-medical: Not on file  Tobacco Use  . Smoking status: Former Smoker    Last attempt to quit: 01/13/1991  Years since quitting: 26.4  . Smokeless tobacco: Never Used  . Tobacco comment: quit 20 years ago 1/2 ppd x 40 years  Substance and Sexual Activity  . Alcohol use: No  . Drug use: No  . Sexual activity: Never    Comment: lives with husband, no dietary restrictions,   Lifestyle  . Physical activity:    Days per week: Not on file    Minutes per session: Not on file  . Stress: Not on file  Relationships  . Social connections:    Talks on phone: Not on file    Gets together: Not on file    Attends religious service: Not on file    Active member of club or organization: Not on file    Attends meetings of clubs or organizations: Not on file    Relationship status: Not on file  . Intimate partner violence:    Fear of current or ex partner: Not on file    Emotionally abused: Not on file    Physically abused: Not on file    Forced sexual activity: Not on file  Other Topics Concern  . Not on file   Social History Narrative   Former Smoker - quit 20 years ago (1/2 ppd x 40 years)   Widowed   1 Daughter         Outpatient Medications Prior to Visit  Medication Sig Dispense Refill  . albuterol (PROVENTIL HFA;VENTOLIN HFA) 108 (90 Base) MCG/ACT inhaler Inhale 2 puffs into the lungs every 6 (six) hours as needed for wheezing or shortness of breath. 1 Inhaler 0  . aspirin 81 MG tablet Take 81 mg by mouth daily.      . budesonide-formoterol (SYMBICORT) 80-4.5 MCG/ACT inhaler INHALE 2 PUFFS INTO THE LUNGS 2 (TWO) TIMES DAILY. 10.2 g 2  . calcium carbonate (OS-CAL) 600 MG TABS Take 600 mg by mouth daily.      . Garlic 474 MG CAPS Take 1 capsule by mouth daily.    Javier Docker Oil 1000 MG CAPS Take 1,000 capsules by mouth daily.    . Multiple Vitamin (MULTIVITAMIN) tablet Take 1 tablet by mouth daily.    . ranitidine (ZANTAC) 150 MG tablet Take 1 tablet (150 mg total) by mouth at bedtime. 90 tablet 0  . Vitamin D, Ergocalciferol, (DRISDOL) 50000 units CAPS capsule TAKE 1 CAPSULE (50,000 UNITS TOTAL) BY MOUTH EVERY 7 (SEVEN) DAYS. 4 capsule 3  . amLODipine (NORVASC) 10 MG tablet TAKE 1 TABLET (10 MG TOTAL) BY MOUTH DAILY. 90 tablet 1  . levocetirizine (XYZAL) 5 MG tablet TAKE 1 TABLET (5 MG TOTAL) BY MOUTH EVERY EVENING. 90 tablet 3  . omeprazole (PRILOSEC) 20 MG capsule TAKE 1 TABLET (20 MG TOTAL) BY MOUTH DAILY. 1 CAPSULE BY MOUTH ONCE DAILY 30 MINUTES BEFORE MEALS 30 capsule 5   No facility-administered medications prior to visit.     Allergies  Allergen Reactions  . Allegra [Fexofenadine] Shortness Of Breath  . Erythromycin Nausea Only  . Oxycodone-Acetaminophen Nausea And Vomiting  . Propoxyphene N-Acetaminophen Nausea And Vomiting      severe vomiting  . Zocor [Simvastatin] Other (See Comments)      Myalgia    Review of Systems  Constitutional: Negative for fever and malaise/fatigue.  HENT: Negative for congestion.   Eyes: Negative for blurred vision.  Respiratory: Negative for  shortness of breath.   Cardiovascular: Negative for chest pain, palpitations and leg swelling.  Gastrointestinal: Negative for abdominal pain, blood in stool and  nausea.  Genitourinary: Negative for dysuria and frequency.  Musculoskeletal: Positive for joint pain. Negative for falls.  Skin: Negative for rash.  Neurological: Negative for dizziness, loss of consciousness and headaches.  Endo/Heme/Allergies: Negative for environmental allergies.  Psychiatric/Behavioral: Negative for depression. The patient is not nervous/anxious.        Objective:    Physical Exam  Constitutional: She is oriented to person, place, and time. She appears well-developed and well-nourished. No distress.  HENT:  Head: Normocephalic and atraumatic.  Eyes: Conjunctivae are normal.  Neck: Neck supple. No thyromegaly present.  Cardiovascular: Normal rate, regular rhythm and normal heart sounds.  No murmur heard. Pulmonary/Chest: Effort normal and breath sounds normal. No respiratory distress.  Abdominal: Soft. Bowel sounds are normal. She exhibits no distension and no mass. There is no tenderness.  Musculoskeletal: She exhibits no edema.  Lymphadenopathy:    She has no cervical adenopathy.  Neurological: She is alert and oriented to person, place, and time.  Skin: Skin is warm and dry.  Psychiatric: She has a normal mood and affect. Her behavior is normal.    BP 132/72   Pulse 65   Temp 98.1 F (36.7 C) (Oral)   Resp 18   Wt 229 lb 12.8 oz (104.2 kg)   SpO2 100%   BMI 32.97 kg/m  Wt Readings from Last 3 Encounters:  06/14/17 229 lb 12.8 oz (104.2 kg)  01/13/17 230 lb 8 oz (104.6 kg)  11/09/16 237 lb (107.5 kg)   BP Readings from Last 3 Encounters:  06/14/17 132/72  01/13/17 122/78  11/09/16 136/77     Immunization History  Administered Date(s) Administered  . Pneumococcal Conjugate-13 09/21/2013  . Pneumococcal Polysaccharide-23 10/31/2010, 10/05/2016  . Td 07/25/2015    Health  Maintenance  Topic Date Due  . COLONOSCOPY  10/05/2016  . INFLUENZA VACCINE  03/14/2018 (Originally 08/12/2017)  . TETANUS/TDAP  07/24/2025  . DEXA SCAN  Completed  . PNA vac Low Risk Adult  Completed    Lab Results  Component Value Date   WBC 5.9 06/14/2017   HGB 13.1 06/14/2017   HCT 39.5 06/14/2017   PLT 225.0 06/14/2017   GLUCOSE 82 06/14/2017   CHOL 191 06/14/2017   TRIG 95.0 06/14/2017   HDL 37.90 (L) 06/14/2017   LDLCALC 134 (H) 06/14/2017   ALT 10 06/14/2017   AST 10 06/14/2017   NA 142 06/14/2017   K 3.8 06/14/2017   CL 106 06/14/2017   CREATININE 1.19 06/14/2017   BUN 19 06/14/2017   CO2 28 06/14/2017   TSH 3.43 06/14/2017    Lab Results  Component Value Date   TSH 3.43 06/14/2017   Lab Results  Component Value Date   WBC 5.9 06/14/2017   HGB 13.1 06/14/2017   HCT 39.5 06/14/2017   MCV 86.8 06/14/2017   PLT 225.0 06/14/2017   Lab Results  Component Value Date   NA 142 06/14/2017   K 3.8 06/14/2017   CO2 28 06/14/2017   GLUCOSE 82 06/14/2017   BUN 19 06/14/2017   CREATININE 1.19 06/14/2017   BILITOT 0.4 06/14/2017   ALKPHOS 58 06/14/2017   AST 10 06/14/2017   ALT 10 06/14/2017   PROT 6.6 06/14/2017   ALBUMIN 3.8 06/14/2017   CALCIUM 9.1 06/14/2017   ANIONGAP 11 05/07/2015   GFR 56.52 (L) 06/14/2017   Lab Results  Component Value Date   CHOL 191 06/14/2017   Lab Results  Component Value Date   HDL 37.90 (L) 06/14/2017  Lab Results  Component Value Date   LDLCALC 134 (H) 06/14/2017   Lab Results  Component Value Date   TRIG 95.0 06/14/2017   Lab Results  Component Value Date   CHOLHDL 5 06/14/2017   No results found for: HGBA1C       Assessment & Plan:   Problem List Items Addressed This Visit    Hyperlipidemia, mixed    Encouraged heart healthy diet, increase exercise, avoid trans fats, consider a krill oil cap daily      Relevant Medications   amLODipine (NORVASC) 10 MG tablet   Other Relevant Orders   Lipid panel  (Completed)   Essential hypertension    Well controlled, no changes to meds. Encouraged heart healthy diet such as the DASH diet and exercise as tolerated.       Relevant Medications   amLODipine (NORVASC) 10 MG tablet   Other Relevant Orders   CBC (Completed)   TSH (Completed)   Osteopenia    Encouraged to get adequate exercise, calcium and vitamin d intake      Right hip pain - Primary    And increased weakness. Her xray confims possible avascular necrosis so an MRI is ordered.       Relevant Orders   DG HIPS BILAT W OR W/O PELVIS 2V (Completed)   Vitamin D deficiency    Check level today,takes weekly dose      Relevant Orders   VITAMIN D 25 Hydroxy (Vit-D Deficiency, Fractures) (Completed)   Muscle cramp, nocturnal    Check a magnesium, try Hyland's leg cramp med       Relevant Orders   Magnesium (Completed)   Ambulatory referral to Orthopedic Surgery   Comprehensive metabolic panel (Completed)   CK (Creatine Kinase) (Completed)    Other Visit Diagnoses    Right thigh pain       Relevant Orders   Ambulatory referral to Orthopedic Surgery      I have discontinued Aalia M. Finnigan's omeprazole. I have also changed her omeprazole. Additionally, I am having her maintain her aspirin, calcium carbonate, Garlic, multivitamin, Krill Oil, budesonide-formoterol, albuterol, ranitidine, Vitamin D (Ergocalciferol), amLODipine, and levocetirizine.  Meds ordered this encounter  Medications  . amLODipine (NORVASC) 10 MG tablet    Sig: Take 1 tablet (10 mg total) by mouth daily.    Dispense:  90 tablet    Refill:  1  . DISCONTD: omeprazole (PRILOSEC) 20 MG capsule    Sig: TAKE 1 TABLET (20 MG TOTAL) BY MOUTH DAILY. 1 CAPSULE BY MOUTH ONCE DAILY 30 MINUTES BEFORE MEALS    Dispense:  30 capsule    Refill:  5  . levocetirizine (XYZAL) 5 MG tablet    Sig: Take 1 tablet (5 mg total) by mouth every evening.    Dispense:  90 tablet    Refill:  3  . omeprazole (PRILOSEC) 20  MG capsule    Sig: Take 1 capsule (20 mg total) by mouth daily as needed.    Dispense:  30 capsule    Refill:  5    CMA served as scribe during this visit. History, Physical and Plan performed by medical provider. Documentation and orders reviewed and attested to.  Penni Homans, MD

## 2017-06-14 NOTE — Assessment & Plan Note (Signed)
Check level today,takes weekly dose

## 2017-06-16 ENCOUNTER — Telehealth: Payer: Self-pay | Admitting: Family Medicine

## 2017-06-16 ENCOUNTER — Other Ambulatory Visit: Payer: Self-pay | Admitting: Family Medicine

## 2017-06-16 DIAGNOSIS — M25551 Pain in right hip: Secondary | ICD-10-CM

## 2017-06-16 NOTE — Assessment & Plan Note (Addendum)
And increased weakness. Her xray confims possible avascular necrosis so an MRI is ordered.

## 2017-06-16 NOTE — Telephone Encounter (Signed)
Reviewed lab results and physician's note with patient. No questions asked. Could not chart in results note as was not forwarded to [facility].

## 2017-06-16 NOTE — Assessment & Plan Note (Signed)
Encouraged to get adequate exercise, calcium and vitamin d intake 

## 2017-06-17 ENCOUNTER — Other Ambulatory Visit: Payer: Self-pay | Admitting: Family Medicine

## 2017-06-17 DIAGNOSIS — M25551 Pain in right hip: Secondary | ICD-10-CM

## 2017-06-23 ENCOUNTER — Other Ambulatory Visit: Payer: Self-pay | Admitting: Family Medicine

## 2017-07-03 ENCOUNTER — Ambulatory Visit (HOSPITAL_BASED_OUTPATIENT_CLINIC_OR_DEPARTMENT_OTHER)
Admission: RE | Admit: 2017-07-03 | Discharge: 2017-07-03 | Disposition: A | Discharge status: Home / Self Care | Payer: Medicare Other | Source: Ambulatory Visit | Attending: Family Medicine | Admitting: Family Medicine

## 2017-07-03 ENCOUNTER — Other Ambulatory Visit: Payer: Self-pay | Admitting: Family Medicine

## 2017-07-03 DIAGNOSIS — M25551 Pain in right hip: Secondary | ICD-10-CM | POA: Diagnosis present

## 2017-07-03 DIAGNOSIS — D259 Leiomyoma of uterus, unspecified: Secondary | ICD-10-CM | POA: Diagnosis not present

## 2017-09-16 ENCOUNTER — Encounter: Payer: Medicare Other | Admitting: Family Medicine

## 2017-10-18 ENCOUNTER — Ambulatory Visit (INDEPENDENT_AMBULATORY_CARE_PROVIDER_SITE_OTHER): Payer: Medicare Other | Admitting: Family Medicine

## 2017-10-18 ENCOUNTER — Encounter: Payer: Self-pay | Admitting: Family Medicine

## 2017-10-18 VITALS — BP 128/82 | HR 78 | Temp 97.8°F | Resp 18 | Wt 238.2 lb

## 2017-10-18 DIAGNOSIS — I10 Essential (primary) hypertension: Secondary | ICD-10-CM | POA: Diagnosis not present

## 2017-10-18 DIAGNOSIS — J441 Chronic obstructive pulmonary disease with (acute) exacerbation: Secondary | ICD-10-CM

## 2017-10-18 DIAGNOSIS — Z1239 Encounter for other screening for malignant neoplasm of breast: Secondary | ICD-10-CM

## 2017-10-18 DIAGNOSIS — E559 Vitamin D deficiency, unspecified: Secondary | ICD-10-CM

## 2017-10-18 DIAGNOSIS — Z0000 Encounter for general adult medical examination without abnormal findings: Secondary | ICD-10-CM | POA: Diagnosis not present

## 2017-10-18 DIAGNOSIS — E782 Mixed hyperlipidemia: Secondary | ICD-10-CM

## 2017-10-18 DIAGNOSIS — M8580 Other specified disorders of bone density and structure, unspecified site: Secondary | ICD-10-CM

## 2017-10-18 DIAGNOSIS — E2839 Other primary ovarian failure: Secondary | ICD-10-CM

## 2017-10-18 MED ORDER — FAMOTIDINE 20 MG PO TABS: 20.0000 mg | ORAL_TABLET | Freq: Two times a day (BID) | ORAL | 5 refills | Status: DC

## 2017-10-18 MED ORDER — ALBUTEROL SULFATE HFA 108 (90 BASE) MCG/ACT IN AERS: 2.0000 | INHALATION_SPRAY | Freq: Four times a day (QID) | RESPIRATORY_TRACT | 0 refills | Status: DC | PRN

## 2017-10-18 MED ORDER — OMEPRAZOLE 20 MG PO CPDR: 20.0000 mg | DELAYED_RELEASE_CAPSULE | Freq: Every day | ORAL | 5 refills | Status: DC | PRN

## 2017-10-18 MED ORDER — AMLODIPINE BESYLATE 10 MG PO TABS: 10.0000 mg | ORAL_TABLET | Freq: Every day | ORAL | 1 refills | Status: DC

## 2017-10-18 MED ORDER — ACETAMINOPHEN-CODEINE #3 300-30 MG PO TABS: 1.0000 | ORAL_TABLET | ORAL | 0 refills | Status: DC | PRN

## 2017-10-18 NOTE — Assessment & Plan Note (Signed)
Patient encouraged to maintain heart healthy diet, regular exercise, adequate sleep. Consider daily probiotics. Take medications as prescribed 

## 2017-10-18 NOTE — Assessment & Plan Note (Addendum)
Encouraged to get adequate exercise, calcium and vitamin d intake. Repeat Dexa scaan.

## 2017-10-18 NOTE — Assessment & Plan Note (Signed)
Supplement and monitor 

## 2017-10-18 NOTE — Assessment & Plan Note (Signed)
Encouraged heart healthy diet, increase exercise, avoid trans fats, consider a krill oil cap daily 

## 2017-10-18 NOTE — Assessment & Plan Note (Signed)
Well controlled, no changes to meds. Encouraged heart healthy diet such as the DASH diet and exercise as tolerated.  °

## 2017-10-18 NOTE — Patient Instructions (Signed)
Preventive Care 65 Years and Older, Female Preventive care refers to lifestyle choices and visits with your health care provider that can promote health and wellness. What does preventive care include?  A yearly physical exam. This is also called an annual well check.  Dental exams once or twice a year.  Routine eye exams. Ask your health care provider how often you should have your eyes checked.  Personal lifestyle choices, including: ? Daily care of your teeth and gums. ? Regular physical activity. ? Eating a healthy diet. ? Avoiding tobacco and drug use. ? Limiting alcohol use. ? Practicing safe sex. ? Taking low-dose aspirin every day. ? Taking vitamin and mineral supplements as recommended by your health care provider. What happens during an annual well check? The services and screenings done by your health care provider during your annual well check will depend on your age, overall health, lifestyle risk factors, and family history of disease. Counseling Your health care provider may ask you questions about your:  Alcohol use.  Tobacco use.  Drug use.  Emotional well-being.  Home and relationship well-being.  Sexual activity.  Eating habits.  History of falls.  Memory and ability to understand (cognition).  Work and work environment.  Reproductive health.  Screening You may have the following tests or measurements:  Height, weight, and BMI.  Blood pressure.  Lipid and cholesterol levels. These may be checked every 5 years, or more frequently if you are over 50 years old.  Skin check.  Lung cancer screening. You may have this screening every year starting at age 55 if you have a 30-pack-year history of smoking and currently smoke or have quit within the past 15 years.  Fecal occult blood test (FOBT) of the stool. You may have this test every year starting at age 50.  Flexible sigmoidoscopy or colonoscopy. You may have a sigmoidoscopy every 5 years or  a colonoscopy every 10 years starting at age 50.  Hepatitis C blood test.  Hepatitis B blood test.  Sexually transmitted disease (STD) testing.  Diabetes screening. This is done by checking your blood sugar (glucose) after you have not eaten for a while (fasting). You may have this done every 1-3 years.  Bone density scan. This is done to screen for osteoporosis. You may have this done starting at age 77.  Mammogram. This may be done every 1-2 years. Talk to your health care provider about how often you should have regular mammograms.  Talk with your health care provider about your test results, treatment options, and if necessary, the need for more tests. Vaccines Your health care provider may recommend certain vaccines, such as:  Influenza vaccine. This is recommended every year.  Tetanus, diphtheria, and acellular pertussis (Tdap, Td) vaccine. You may need a Td booster every 10 years.  Varicella vaccine. You may need this if you have not been vaccinated.  Zoster vaccine. You may need this after age 60.  Measles, mumps, and rubella (MMR) vaccine. You may need at least one dose of MMR if you were born in 1957 or later. You may also need a second dose.  Pneumococcal 13-valent conjugate (PCV13) vaccine. One dose is recommended after age 77.  Pneumococcal polysaccharide (PPSV23) vaccine. One dose is recommended after age 77.  Meningococcal vaccine. You may need this if you have certain conditions.  Hepatitis A vaccine. You may need this if you have certain conditions or if you travel or work in places where you may be exposed to hepatitis   A.  Hepatitis B vaccine. You may need this if you have certain conditions or if you travel or work in places where you may be exposed to hepatitis B.  Haemophilus influenzae type b (Hib) vaccine. You may need this if you have certain conditions.  Talk to your health care provider about which screenings and vaccines you need and how often you  need them. This information is not intended to replace advice given to you by your health care provider. Make sure you discuss any questions you have with your health care provider. Document Released: 01/25/2015 Document Revised: 09/18/2015 Document Reviewed: 10/30/2014 Elsevier Interactive Patient Education  2018 [employer] Inc[employer]  

## 2017-10-19 LAB — CBC WITH DIFFERENTIAL/PLATELET
Basophils Absolute: 0.1 10*3/uL (ref 0.0–0.1)
Basophils Relative: 0.8 % (ref 0.0–3.0)
EOS PCT: 4.9 % (ref 0.0–5.0)
Eosinophils Absolute: 0.3 10*3/uL (ref 0.0–0.7)
HEMATOCRIT: 39.1 % (ref 36.0–46.0)
Hemoglobin: 12.8 g/dL (ref 12.0–15.0)
LYMPHS ABS: 2.1 10*3/uL (ref 0.7–4.0)
Lymphocytes Relative: 31.7 % (ref 12.0–46.0)
MCHC: 32.8 g/dL (ref 30.0–36.0)
MCV: 88.2 fl (ref 78.0–100.0)
Monocytes Absolute: 0.8 10*3/uL (ref 0.1–1.0)
Monocytes Relative: 11.8 % (ref 3.0–12.0)
NEUTROS PCT: 50.8 % (ref 43.0–77.0)
Neutro Abs: 3.3 10*3/uL (ref 1.4–7.7)
Platelets: 212 10*3/uL (ref 150.0–400.0)
RBC: 4.44 Mil/uL (ref 3.87–5.11)
RDW: 14.7 % (ref 11.5–15.5)
WBC: 6.5 10*3/uL (ref 4.0–10.5)

## 2017-10-19 LAB — COMPREHENSIVE METABOLIC PANEL
ALK PHOS: 56 U/L (ref 39–117)
ALT: 19 U/L (ref 0–35)
AST: 14 U/L (ref 0–37)
Albumin: 4.1 g/dL (ref 3.5–5.2)
BILIRUBIN TOTAL: 0.3 mg/dL (ref 0.2–1.2)
BUN: 20 mg/dL (ref 6–23)
CO2: 28 mEq/L (ref 19–32)
CREATININE: 1.17 mg/dL (ref 0.40–1.20)
Calcium: 9.4 mg/dL (ref 8.4–10.5)
Chloride: 106 mEq/L (ref 96–112)
GFR: 57.58 mL/min — ABNORMAL LOW (ref >60.00)
GLUCOSE: 82 mg/dL (ref 70–99)
POTASSIUM: 4.6 meq/L (ref 3.5–5.1)
SODIUM: 141 meq/L (ref 135–145)
Total Protein: 7 g/dL (ref 6.0–8.3)

## 2017-10-19 LAB — LIPID PANEL
Cholesterol: 178 mg/dL (ref 0–200)
HDL: 41.5 mg/dL (ref >39.00)
LDL Cholesterol: 109 mg/dL — ABNORMAL HIGH (ref 0–99)
NONHDL: 136.59
Total CHOL/HDL Ratio: 4
Triglycerides: 140 mg/dL (ref 0.0–149.0)
VLDL: 28 mg/dL (ref 0.0–40.0)

## 2017-10-19 LAB — TSH: TSH: 2.09 u[IU]/mL (ref 0.35–4.50)

## 2017-10-21 ENCOUNTER — Ambulatory Visit (HOSPITAL_BASED_OUTPATIENT_CLINIC_OR_DEPARTMENT_OTHER)
Admission: RE | Admit: 2017-10-21 | Discharge: 2017-10-21 | Disposition: A | Discharge status: Home / Self Care | Payer: Medicare Other | Source: Ambulatory Visit | Attending: Family Medicine | Admitting: Family Medicine

## 2017-10-21 DIAGNOSIS — M8580 Other specified disorders of bone density and structure, unspecified site: Secondary | ICD-10-CM | POA: Diagnosis present

## 2017-10-21 DIAGNOSIS — Z1231 Encounter for screening mammogram for malignant neoplasm of breast: Secondary | ICD-10-CM | POA: Insufficient documentation

## 2017-10-21 DIAGNOSIS — E2839 Other primary ovarian failure: Secondary | ICD-10-CM

## 2017-10-21 DIAGNOSIS — Z1239 Encounter for other screening for malignant neoplasm of breast: Secondary | ICD-10-CM

## 2017-10-24 NOTE — Progress Notes (Signed)
Subjective:    Patient ID: Terri Mckee, female    DOB: [birth year], 77 y.o.   MRN: [id number]  No chief complaint on file.   HPI Patient is in today for annual exam and follow up on chronic medical concerns including reflux, hypertension, hyperlipidemia and vitamin D deficiency. Is doing well with activities of daily living. No recent febrile illness or hospitalizations. Tries to minimize carbohydrates but is not exercising regularly. Denies CP/palp/SOB/HA/congestion/fevers/GI or GU c/o. Taking meds as prescribed  Past Medical History:  Diagnosis Date  . 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 Acute upper respiratory infection 04/09/2014  . Anemia   . Arthritis    back  . 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 Cataract   . Cataracts, bilateral 07/25/2015  . Cervical cancer screening 07/07/2012  . Colon polyps   . Elevated serum creatinine    with ACE inhibitor (Normal MRA of renal arteries)  . GERD (gastroesophageal reflux disease)   . History of cyst of breast   . Hoarseness of voice 03/13/2012  . Hypertension   . Medicare annual wellness visit, subsequent 09/02/2014  . Muscle spasm of left lower extremity 03/21/2007   Qualifier: Diagnosis of  By: Wynona Luna   . Osteopenia 08/17/2014  . Preventative health care 04/05/2016  . Skin lesion 07/10/2012  . Urinary incontinence 07/10/2012  . Vitamin D deficiency 04/03/2016    Past Surgical History:  Procedure Laterality Date  . APPENDECTOMY    . BREAST CYST EXCISION Bilateral    right breast, twice  . OVARY SURGERY     right ovary & tube replacement  . WRIST GANGLION EXCISION     rt    Family History  Problem Relation Age of Onset  . Brain cancer Mother        deceased age 52  . Alcohol abuse Mother   . Other Brother        died of sepsis  . Gout Brother   . Alcohol abuse Father   . Cancer Sister        throat  . Pulmonary embolism Daughter   . Mental  illness Daughter        anxiety  . Diabetes Maternal Aunt   . Diabetes Maternal Aunt   . Seizures Maternal Aunt   . Birth defects Maternal Aunt   . Lupus Grandchild   . Sickle cell anemia Grandchild   . Colon cancer Neg Hx   . Breast cancer Neg Hx     Social History   Socioeconomic History  . Marital status: Married    Spouse name: Not on file  . Number of children: Not on file  . Years of education: Not on file  . Highest education level: Not on file  Occupational History  . Not on file  Social Needs  . Financial resource strain: Not on file  . Food insecurity:    Worry: Not on file    Inability: Not on file  . Transportation needs:    Medical: Not on file    Non-medical: Not on file  Tobacco Use  . Smoking status: Former Smoker    Last attempt to quit: 01/13/1991    Years since quitting: 26.7  . Smokeless tobacco: Never Used  . Tobacco comment: quit 20 years ago 1/2 ppd x 40 years  Substance and Sexual Activity  . Alcohol use: No  .  Drug use: No  . Sexual activity: Never    Comment: lives with husband, no dietary restrictions,   Lifestyle  . Physical activity:    Days per week: Not on file    Minutes per session: Not on file  . Stress: Not on file  Relationships  . Social connections:    Talks on phone: Not on file    Gets together: Not on file    Attends religious service: Not on file    Active member of club or organization: Not on file    Attends meetings of clubs or organizations: Not on file    Relationship status: Not on file  . Intimate partner violence:    Fear of current or ex partner: Not on file    Emotionally abused: Not on file    Physically abused: Not on file    Forced sexual activity: Not on file  Other Topics Concern  . Not on file  Social History Narrative   Former Smoker - quit 20 years ago (1/2 ppd x 40 years)   Widowed   1 Daughter         Outpatient Medications Prior to Visit  Medication Sig Dispense Refill  . aspirin 81 MG  tablet Take 81 mg by mouth daily.      . budesonide-formoterol (SYMBICORT) 80-4.5 MCG/ACT inhaler INHALE 2 PUFFS INTO THE LUNGS 2 (TWO) TIMES DAILY. 10.2 g 2  . calcium carbonate (OS-CAL) 600 MG TABS Take 600 mg by mouth daily.      . Garlic 409 MG CAPS Take 1 capsule by mouth daily.    Javier Docker Oil 1000 MG CAPS Take 1,000 capsules by mouth daily.    Marland Kitchen levocetirizine (XYZAL) 5 MG tablet Take 1 tablet (5 mg total) by mouth every evening. 90 tablet 3  . Multiple Vitamin (MULTIVITAMIN) tablet Take 1 tablet by mouth daily.    . Vitamin D, Ergocalciferol, (DRISDOL) 50000 units CAPS capsule TAKE 1 CAPSULE (50,000 UNITS TOTAL) BY MOUTH EVERY 7 (SEVEN) DAYS. 4 capsule 1  . albuterol (PROVENTIL HFA;VENTOLIN HFA) 108 (90 Base) MCG/ACT inhaler Inhale 2 puffs into the lungs every 6 (six) hours as needed for wheezing or shortness of breath. 1 Inhaler 0  . amLODipine (NORVASC) 10 MG tablet Take 1 tablet (10 mg total) by mouth daily. 90 tablet 1  . omeprazole (PRILOSEC) 20 MG capsule Take 1 capsule (20 mg total) by mouth daily as needed. 30 capsule 5  . ranitidine (ZANTAC) 150 MG tablet Take 1 tablet (150 mg total) by mouth at bedtime. 90 tablet 0   No facility-administered medications prior to visit.     Allergies  Allergen Reactions  . Allegra [Fexofenadine] Shortness Of Breath  . Erythromycin Nausea Only  . Oxycodone-Acetaminophen Nausea And Vomiting  . Propoxyphene N-Acetaminophen Nausea And Vomiting      severe vomiting  . Zocor [Simvastatin] Other (See Comments)      Myalgia    Review of Systems  Constitutional: Negative for chills, fever and malaise/fatigue.  HENT: Negative for congestion and hearing loss.   Eyes: Negative for discharge.  Respiratory: Negative for cough, sputum production and shortness of breath.   Cardiovascular: Negative for chest pain, palpitations and leg swelling.  Gastrointestinal: Negative for abdominal pain, blood in stool, constipation, diarrhea, heartburn, nausea  and vomiting.  Genitourinary: Negative for dysuria, frequency, hematuria and urgency.  Musculoskeletal: Negative for back pain, falls and myalgias.  Skin: Negative for rash.  Neurological: Negative for dizziness, sensory change, loss  of consciousness, weakness and headaches.  Endo/Heme/Allergies: Negative for environmental allergies. Does not bruise/bleed easily.  Psychiatric/Behavioral: Negative for depression and suicidal ideas. The patient is not nervous/anxious and does not have insomnia.        Objective:    Physical Exam  Constitutional: She is oriented to person, place, and time. No distress.  HENT:  Head: Normocephalic and atraumatic.  Right Ear: External ear normal.  Left Ear: External ear normal.  Nose: Nose normal.  Mouth/Throat: Oropharynx is clear and moist. No oropharyngeal exudate.  Eyes: Pupils are equal, round, and reactive to light. Conjunctivae are normal. Right eye exhibits no discharge. Left eye exhibits no discharge. No scleral icterus.  Neck: Normal range of motion. Neck supple. No thyromegaly present.  Cardiovascular: Normal rate, regular rhythm, normal heart sounds and intact distal pulses.  No murmur heard. Pulmonary/Chest: Effort normal and breath sounds normal. No respiratory distress. She has no wheezes. She has no rales.  Abdominal: Soft. Bowel sounds are normal. She exhibits no distension and no mass. There is no tenderness.  Musculoskeletal: Normal range of motion. She exhibits no edema or tenderness.  Lymphadenopathy:    She has no cervical adenopathy.  Neurological: She is alert and oriented to person, place, and time. She has normal reflexes. She displays normal reflexes. No cranial nerve deficit. Coordination normal.  Skin: Skin is warm and dry. No rash noted. She is not diaphoretic.    BP 128/82 (BP Location: Left Arm, Patient Position: Sitting, Cuff Size: Normal)   Pulse 78   Temp 97.8 F (36.6 C) (Oral)   Resp 18   Wt 238 lb 3.2 oz (108 kg)    SpO2 93%   BMI 34.18 kg/m  Wt Readings from Last 3 Encounters:  10/18/17 238 lb 3.2 oz (108 kg)  06/14/17 229 lb 12.8 oz (104.2 kg)  01/13/17 230 lb 8 oz (104.6 kg)     Lab Results  Component Value Date   WBC 6.5 10/18/2017   HGB 12.8 10/18/2017   HCT 39.1 10/18/2017   PLT 212.0 10/18/2017   GLUCOSE 82 10/18/2017   CHOL 178 10/18/2017   TRIG 140.0 10/18/2017   HDL 41.50 10/18/2017   LDLCALC 109 (H) 10/18/2017   ALT 19 10/18/2017   AST 14 10/18/2017   NA 141 10/18/2017   K 4.6 10/18/2017   CL 106 10/18/2017   CREATININE 1.17 10/18/2017   BUN 20 10/18/2017   CO2 28 10/18/2017   TSH 2.09 10/18/2017    Lab Results  Component Value Date   TSH 2.09 10/18/2017   Lab Results  Component Value Date   WBC 6.5 10/18/2017   HGB 12.8 10/18/2017   HCT 39.1 10/18/2017   MCV 88.2 10/18/2017   PLT 212.0 10/18/2017   Lab Results  Component Value Date   NA 141 10/18/2017   K 4.6 10/18/2017   CO2 28 10/18/2017   GLUCOSE 82 10/18/2017   BUN 20 10/18/2017   CREATININE 1.17 10/18/2017   BILITOT 0.3 10/18/2017   ALKPHOS 56 10/18/2017   AST 14 10/18/2017   ALT 19 10/18/2017   PROT 7.0 10/18/2017   ALBUMIN 4.1 10/18/2017   CALCIUM 9.4 10/18/2017   ANIONGAP 11 05/07/2015   GFR 57.58 (L) 10/18/2017   Lab Results  Component Value Date   CHOL 178 10/18/2017   Lab Results  Component Value Date   HDL 41.50 10/18/2017   Lab Results  Component Value Date   LDLCALC 109 (H) 10/18/2017   Lab Results  Component Value Date   TRIG 140.0 10/18/2017   Lab Results  Component Value Date   CHOLHDL 4 10/18/2017   No results found for: HGBA1C     Assessment & Plan:   Problem List Items Addressed This Visit    Hyperlipidemia, mixed    Encouraged heart healthy diet, increase exercise, avoid trans fats, consider a krill oil cap daily      Relevant Medications   amLODipine (NORVASC) 10 MG tablet   Other Relevant Orders   Lipid panel (Completed)   Essential hypertension      Well controlled, no changes to meds. Encouraged heart healthy diet such as the DASH diet and exercise as tolerated.       Relevant Medications   amLODipine (NORVASC) 10 MG tablet   Other Relevant Orders   CBC with Differential/Platelet (Completed)   Comprehensive metabolic panel (Completed)   TSH (Completed)   Osteopenia    Encouraged to get adequate exercise, calcium and vitamin d intake. Repeat Dexa scaan.       Relevant Orders   DG Bone Density (Completed)   Breast cancer screening - Primary   Relevant Orders   MM 3D SCREEN BREAST BILATERAL (Completed)   Vitamin D deficiency    Supplement and monitor      Preventative health care    Patient encouraged to maintain heart healthy diet, regular exercise, adequate sleep. Consider daily probiotics. Take medications as prescribed       Other Visit Diagnoses    COPD exacerbation ([place])       Relevant Medications   albuterol (PROVENTIL HFA;VENTOLIN HFA) 108 (90 Base) MCG/ACT inhaler   Estrogen deficiency       Relevant Orders   DG Bone Density (Completed)      I have discontinued Rogue Jury. Shepler's ranitidine. I am also having her start on acetaminophen-codeine and famotidine. Additionally, I am having her maintain her aspirin, calcium carbonate, Garlic, multivitamin, Krill Oil, budesonide-formoterol, levocetirizine, Vitamin D (Ergocalciferol), albuterol, omeprazole, and amLODipine.  Meds ordered this encounter  Medications  . albuterol (PROVENTIL HFA;VENTOLIN HFA) 108 (90 Base) MCG/ACT inhaler    Sig: Inhale 2 puffs into the lungs every 6 (six) hours as needed for wheezing or shortness of breath.    Dispense:  1 Inhaler    Refill:  0  . omeprazole (PRILOSEC) 20 MG capsule    Sig: Take 1 capsule (20 mg total) by mouth daily as needed.    Dispense:  30 capsule    Refill:  5  . acetaminophen-codeine (TYLENOL #3) 300-30 MG tablet    Sig: Take 1-2 tablets by mouth every 4 (four) hours as needed for moderate pain or  severe pain.    Dispense:  40 tablet    Refill:  0  . famotidine (PEPCID) 20 MG tablet    Sig: Take 1 tablet (20 mg total) by mouth 2 (two) times daily.    Dispense:  60 tablet    Refill:  5  . amLODipine (NORVASC) 10 MG tablet    Sig: Take 1 tablet (10 mg total) by mouth daily.    Dispense:  90 tablet    Refill:  1     Penni Homans, MD

## 2017-11-26 ENCOUNTER — Ambulatory Visit: Payer: Medicare Other | Admitting: *Deleted

## 2017-12-03 ENCOUNTER — Ambulatory Visit: Payer: Medicare Other | Admitting: *Deleted

## 2017-12-06 NOTE — Progress Notes (Deleted)
Subjective:   Terri Mckee is a 77 y.o. female who presents for an Initial Medicare Annual Wellness Visit.  The Patient was informed that the wellness visit is to identify future health risk and educate and initiate measures that can reduce risk for increased disease through the lifespan.   Describes health as fair, good or great?    Review of Systems   No ROS.  Medicare Wellness Visit. Additional risk factors are reflected in the social history.   Sleep patterns: Home Safety/Smoke Alarms: Feels safe in home. Smoke alarms in place.    Female:        Mammo- utd       Dexa scan- utd       CCS-past due as of 09/2016    Objective:    There were no vitals filed for this visit. There is no height or weight on file to calculate BMI.  Advanced Directives 05/24/2015 08/17/2014 02/13/2014  Does Patient Have a Medical Advance Directive? No No No  Would patient like information on creating a medical advance directive? No - patient declined information No - patient declined information No - patient declined information    Current Medications (verified) Outpatient Encounter Medications as of 12/07/2017  Medication Sig  . acetaminophen-codeine (TYLENOL #3) 300-30 MG tablet Take 1-2 tablets by mouth every 4 (four) hours as needed for moderate pain or severe pain.  Marland Kitchen albuterol (PROVENTIL HFA;VENTOLIN HFA) 108 (90 Base) MCG/ACT inhaler Inhale 2 puffs into the lungs every 6 (six) hours as needed for wheezing or shortness of breath.  Marland Kitchen amLODipine (NORVASC) 10 MG tablet Take 1 tablet (10 mg total) by mouth daily.  Marland Kitchen aspirin 81 MG tablet Take 81 mg by mouth daily.    . budesonide-formoterol (SYMBICORT) 80-4.5 MCG/ACT inhaler INHALE 2 PUFFS INTO THE LUNGS 2 (TWO) TIMES DAILY.  . calcium carbonate (OS-CAL) 600 MG TABS Take 600 mg by mouth daily.    . famotidine (PEPCID) 20 MG tablet Take 1 tablet (20 mg total) by mouth 2 (two) times daily.  . Garlic 416 MG CAPS Take 1 capsule by mouth daily.  Javier Docker Oil 1000 MG CAPS Take 1,000 capsules by mouth daily.  Marland Kitchen levocetirizine (XYZAL) 5 MG tablet Take 1 tablet (5 mg total) by mouth every evening.  . Multiple Vitamin (MULTIVITAMIN) tablet Take 1 tablet by mouth daily.  Marland Kitchen omeprazole (PRILOSEC) 20 MG capsule Take 1 capsule (20 mg total) by mouth daily as needed.  . Vitamin D, Ergocalciferol, (DRISDOL) 50000 units CAPS capsule TAKE 1 CAPSULE (50,000 UNITS TOTAL) BY MOUTH EVERY 7 (SEVEN) DAYS.   No facility-administered encounter medications on file as of 12/07/2017.     Allergies (verified) Allegra [fexofenadine]; Erythromycin; Oxycodone-acetaminophen; Propoxyphene n-acetaminophen; and Zocor [simvastatin]   History: Past Medical History:  Diagnosis Date  . 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 Acute upper respiratory infection 04/09/2014  . Anemia   . Arthritis    back  . 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 Cataract   . Cataracts, bilateral 07/25/2015  . Cervical cancer screening 07/07/2012  . Colon polyps   . Elevated serum creatinine    with ACE inhibitor (Normal MRA of renal arteries)  . GERD (gastroesophageal reflux disease)   . History of cyst of breast   . Hoarseness of voice 03/13/2012  . Hypertension   . Medicare annual wellness visit, subsequent 09/02/2014  . Muscle  spasm of left lower extremity 03/21/2007   Qualifier: Diagnosis of  By: Wynona Luna   . Osteopenia 08/17/2014  . Preventative health care 04/05/2016  . Skin lesion 07/10/2012  . Urinary incontinence 07/10/2012  . Vitamin D deficiency 04/03/2016   Past Surgical History:  Procedure Laterality Date  . APPENDECTOMY    . BREAST CYST EXCISION Bilateral    right breast, twice  . OVARY SURGERY     right ovary & tube replacement  . WRIST GANGLION EXCISION     rt   Family History  Problem Relation Age of Onset  . Brain cancer Mother        deceased age 63  . Alcohol abuse Mother   . Other Brother         died of sepsis  . Gout Brother   . Alcohol abuse Father   . Cancer Sister        throat  . Pulmonary embolism Daughter   . Mental illness Daughter        anxiety  . Diabetes Maternal Aunt   . Diabetes Maternal Aunt   . Seizures Maternal Aunt   . Birth defects Maternal Aunt   . Lupus Grandchild   . Sickle cell anemia Grandchild   . Colon cancer Neg Hx   . Breast cancer Neg Hx    Social History   Socioeconomic History  . Marital status: Married    Spouse name: Not on file  . Number of children: Not on file  . Years of education: Not on file  . Highest education level: Not on file  Occupational History  . Not on file  Social Needs  . Financial resource strain: Not on file  . Food insecurity:    Worry: Not on file    Inability: Not on file  . Transportation needs:    Medical: Not on file    Non-medical: Not on file  Tobacco Use  . Smoking status: Former Smoker    Last attempt to quit: 01/13/1991    Years since quitting: 26.9  . Smokeless tobacco: Never Used  . Tobacco comment: quit 20 years ago 1/2 ppd x 40 years  Substance and Sexual Activity  . Alcohol use: No  . Drug use: No  . Sexual activity: Never    Comment: lives with husband, no dietary restrictions,   Lifestyle  . Physical activity:    Days per week: Not on file    Minutes per session: Not on file  . Stress: Not on file  Relationships  . Social connections:    Talks on phone: Not on file    Gets together: Not on file    Attends religious service: Not on file    Active member of club or organization: Not on file    Attends meetings of clubs or organizations: Not on file    Relationship status: Not on file  Other Topics Concern  . Not on file  Social History Narrative   Former Smoker - quit 20 years ago (1/2 ppd x 40 years)   Widowed   1 Daughter         Tobacco Counseling Counseling given: Not Answered Comment: quit 20 years ago 1/2 ppd x 40 years   Clinical Intake:                         Activities of Daily Living No flowsheet data found.   Immunizations and Health Maintenance  Immunization History  Administered Date(s) Administered  . Pneumococcal Conjugate-13 09/21/2013  . Pneumococcal Polysaccharide-23 10/31/2010, 10/05/2016  . Td 07/25/2015   Health Maintenance Due  Topic Date Due  . COLONOSCOPY  10/05/2016    Patient Care Team: [name], MD as PCP - General (Family Medicine) Berle Mull, MD as Consulting Physician (Family Medicine) Irene Shipper, MD as Consulting Physician (Gastroenterology) Bjorn Loser, MD as Consulting Physician (Urology) Druscilla Brownie, MD as Consulting Physician (Dermatology)  Indicate any recent Medical Services you may have received from other than [facility] providers in the past year (date may be approximate).     Assessment:   This is a routine wellness examination for Angeliyah. Physical assessment deferred to PCP.  Hearing/Vision screen No exam data present  Dietary issues and exercise activities discussed:   Diet (meal preparation, eat out, water intake, caffeinated beverages, dairy products, fruits and vegetables): {Desc; diets:16563} Breakfast: Lunch:  Dinner:      Goals   None    Depression Screen PHQ 2/9 Scores 10/18/2017 04/03/2016 07/25/2015 03/29/2014  PHQ - 2 Score 0 0 0 0    Fall Risk Fall Risk  10/18/2017 04/03/2016 07/25/2015 03/29/2014  Falls in the past year? No No No No    Cognitive Function:        Screening Tests Health Maintenance  Topic Date Due  . COLONOSCOPY  10/05/2016  . INFLUENZA VACCINE  03/14/2018 (Originally 08/12/2017)  . TETANUS/TDAP  07/24/2025  . DEXA SCAN  Completed  . PNA vac Low Risk Adult  Completed       Plan:   ***  I have personally reviewed and noted the following in the patient's chart:   . Medical and social history . Use of alcohol, tobacco or illicit drugs  . Current medications and supplements . Functional ability and  status . Nutritional status . Physical activity . Advanced directives . List of other physicians . Hospitalizations, surgeries, and ER visits in previous 12 months . Vitals . Screenings to include cognitive, depression, and falls . Referrals and appointments  In addition, I have reviewed and discussed with patient certain preventive protocols, quality metrics, and best practice recommendations. A written personalized care plan for preventive services as well as general preventive health recommendations were provided to patient.     Shela Nevin, [place]   12/06/2017

## 2017-12-07 ENCOUNTER — Ambulatory Visit: Payer: Medicare Other | Admitting: *Deleted

## 2017-12-10 ENCOUNTER — Telehealth: Payer: Self-pay | Admitting: Family Medicine

## 2017-12-10 NOTE — Telephone Encounter (Signed)
Copied from [place] [id number]. Topic: Quick Communication - See Telephone Encounter >> Dec 10, 2017  9:33 AM Blase Mess A wrote: CRM for notification. See Telephone encounter for: 12/10/17. Patient is calling to reschedule AWV with Naaman Plummer. No appts are available. Please advise with Patient [phone]

## 2017-12-15 NOTE — Progress Notes (Deleted)
Subjective:   Terri Mckee is a 77 y.o. female who presents for an Initial Medicare Annual Wellness Visit.  The Patient was informed that the wellness visit is to identify future health risk and educate and initiate measures that can reduce risk for increased disease through the lifespan.   Describes health as fair, good or great?   Review of Systems   No ROS.  Medicare Wellness Visit. Additional risk factors are reflected in the social history.    Sleep patterns:  Home Safety/Smoke Alarms: Feels safe in home. Smoke alarms in place.     Female:      Mammo- utd       Dexa scan- utd       CCS- 10/06/11-recall 5 yrs    Objective:    There were no vitals filed for this visit. There is no height or weight on file to calculate BMI.  Advanced Directives 05/24/2015 08/17/2014 02/13/2014  Does Patient Have a Medical Advance Directive? No No No  Would patient like information on creating a medical advance directive? No - patient declined information No - patient declined information No - patient declined information    Current Medications (verified) Outpatient Encounter Medications as of 12/16/2017  Medication Sig  . acetaminophen-codeine (TYLENOL #3) 300-30 MG tablet Take 1-2 tablets by mouth every 4 (four) hours as needed for moderate pain or severe pain.  Marland Kitchen albuterol (PROVENTIL HFA;VENTOLIN HFA) 108 (90 Base) MCG/ACT inhaler Inhale 2 puffs into the lungs every 6 (six) hours as needed for wheezing or shortness of breath.  Marland Kitchen amLODipine (NORVASC) 10 MG tablet Take 1 tablet (10 mg total) by mouth daily.  Marland Kitchen aspirin 81 MG tablet Take 81 mg by mouth daily.    . budesonide-formoterol (SYMBICORT) 80-4.5 MCG/ACT inhaler INHALE 2 PUFFS INTO THE LUNGS 2 (TWO) TIMES DAILY.  . calcium carbonate (OS-CAL) 600 MG TABS Take 600 mg by mouth daily.    . famotidine (PEPCID) 20 MG tablet Take 1 tablet (20 mg total) by mouth 2 (two) times daily.  . Garlic 488 MG CAPS Take 1 capsule by mouth daily.  Javier Docker Oil 1000 MG CAPS Take 1,000 capsules by mouth daily.  Marland Kitchen levocetirizine (XYZAL) 5 MG tablet Take 1 tablet (5 mg total) by mouth every evening.  . Multiple Vitamin (MULTIVITAMIN) tablet Take 1 tablet by mouth daily.  Marland Kitchen omeprazole (PRILOSEC) 20 MG capsule Take 1 capsule (20 mg total) by mouth daily as needed.  . Vitamin D, Ergocalciferol, (DRISDOL) 50000 units CAPS capsule TAKE 1 CAPSULE (50,000 UNITS TOTAL) BY MOUTH EVERY 7 (SEVEN) DAYS.   No facility-administered encounter medications on file as of 12/16/2017.     Allergies (verified) Allegra [fexofenadine]; Erythromycin; Oxycodone-acetaminophen; Propoxyphene n-acetaminophen; and Zocor [simvastatin]   History: Past Medical History:  Diagnosis Date  . 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 Acute upper respiratory infection 04/09/2014  . Anemia   . Arthritis    back  . 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 Cataract   . Cataracts, bilateral 07/25/2015  . Cervical cancer screening 07/07/2012  . Colon polyps   . Elevated serum creatinine    with ACE inhibitor (Normal MRA of renal arteries)  . GERD (gastroesophageal reflux disease)   . History of cyst of breast   . Hoarseness of voice 03/13/2012  . Hypertension   . Medicare annual wellness visit, subsequent 09/02/2014  . Muscle spasm  of left lower extremity 03/21/2007   Qualifier: Diagnosis of  By: Wynona Luna   . Osteopenia 08/17/2014  . Preventative health care 04/05/2016  . Skin lesion 07/10/2012  . Urinary incontinence 07/10/2012  . Vitamin D deficiency 04/03/2016   Past Surgical History:  Procedure Laterality Date  . APPENDECTOMY    . BREAST CYST EXCISION Bilateral    right breast, twice  . OVARY SURGERY     right ovary & tube replacement  . WRIST GANGLION EXCISION     rt   Family History  Problem Relation Age of Onset  . Brain cancer Mother        deceased age 68  . Alcohol abuse Mother   . Other Brother         died of sepsis  . Gout Brother   . Alcohol abuse Father   . Cancer Sister        throat  . Pulmonary embolism Daughter   . Mental illness Daughter        anxiety  . Diabetes Maternal Aunt   . Diabetes Maternal Aunt   . Seizures Maternal Aunt   . Birth defects Maternal Aunt   . Lupus Grandchild   . Sickle cell anemia Grandchild   . Colon cancer Neg Hx   . Breast cancer Neg Hx    Social History   Socioeconomic History  . Marital status: Married    Spouse name: Not on file  . Number of children: Not on file  . Years of education: Not on file  . Highest education level: Not on file  Occupational History  . Not on file  Social Needs  . Financial resource strain: Not on file  . Food insecurity:    Worry: Not on file    Inability: Not on file  . Transportation needs:    Medical: Not on file    Non-medical: Not on file  Tobacco Use  . Smoking status: Former Smoker    Last attempt to quit: 01/13/1991    Years since quitting: 26.9  . Smokeless tobacco: Never Used  . Tobacco comment: quit 20 years ago 1/2 ppd x 40 years  Substance and Sexual Activity  . Alcohol use: No  . Drug use: No  . Sexual activity: Never    Comment: lives with husband, no dietary restrictions,   Lifestyle  . Physical activity:    Days per week: Not on file    Minutes per session: Not on file  . Stress: Not on file  Relationships  . Social connections:    Talks on phone: Not on file    Gets together: Not on file    Attends religious service: Not on file    Active member of club or organization: Not on file    Attends meetings of clubs or organizations: Not on file    Relationship status: Not on file  Other Topics Concern  . Not on file  Social History Narrative   Former Smoker - quit 20 years ago (1/2 ppd x 40 years)   Widowed   1 Daughter         Tobacco Counseling Counseling given: Not Answered Comment: quit 20 years ago 1/2 ppd x 40 years   Clinical Intake:                          Activities of Daily Living No flowsheet data found.   Immunizations and Health Maintenance  Immunization History  Administered Date(s) Administered  . Pneumococcal Conjugate-13 09/21/2013  . Pneumococcal Polysaccharide-23 10/31/2010, 10/05/2016  . Td 07/25/2015   Health Maintenance Due  Topic Date Due  . COLONOSCOPY  10/05/2016    Patient Care Team: [name], MD as PCP - General (Family Medicine) Berle Mull, MD as Consulting Physician (Family Medicine) Irene Shipper, MD as Consulting Physician (Gastroenterology) Bjorn Loser, MD as Consulting Physician (Urology) Druscilla Brownie, MD as Consulting Physician (Dermatology)  Indicate any recent Medical Services you may have received from other than [facility] providers in the past year (date may be approximate).     Assessment:   This is a routine wellness examination for Taji. Physical assessment deferred to PCP.  Hearing/Vision screen No exam data present  Dietary issues and exercise activities discussed:   Diet (meal preparation, eat out, water intake, caffeinated beverages, dairy products, fruits and vegetables): {Desc; diets:16563} Breakfast: Lunch:  Dinner:      Goals   None    Depression Screen PHQ 2/9 Scores 10/18/2017 04/03/2016 07/25/2015 03/29/2014  PHQ - 2 Score 0 0 0 0    Fall Risk Fall Risk  10/18/2017 04/03/2016 07/25/2015 03/29/2014  Falls in the past year? No No No No    Cognitive Function:        Screening Tests Health Maintenance  Topic Date Due  . COLONOSCOPY  10/05/2016  . INFLUENZA VACCINE  03/14/2018 (Originally 08/12/2017)  . TETANUS/TDAP  07/24/2025  . DEXA SCAN  Completed  . PNA vac Low Risk Adult  Completed      Plan:   ***  I have personally reviewed and noted the following in the patient's chart:   . Medical and social history . Use of alcohol, tobacco or illicit drugs  . Current medications and supplements . Functional ability and  status . Nutritional status . Physical activity . Advanced directives . List of other physicians . Hospitalizations, surgeries, and ER visits in previous 12 months . Vitals . Screenings to include cognitive, depression, and falls . Referrals and appointments  In addition, I have reviewed and discussed with patient certain preventive protocols, quality metrics, and best practice recommendations. A written personalized care plan for preventive services as well as general preventive health recommendations were provided to patient.     Shela Nevin, [place]   12/15/2017

## 2017-12-16 ENCOUNTER — Ambulatory Visit: Payer: Medicare Other | Admitting: *Deleted

## 2017-12-31 ENCOUNTER — Other Ambulatory Visit: Payer: Self-pay | Admitting: Family Medicine

## 2018-01-10 ENCOUNTER — Encounter: Payer: Self-pay | Admitting: Internal Medicine

## 2018-01-10 ENCOUNTER — Ambulatory Visit (INDEPENDENT_AMBULATORY_CARE_PROVIDER_SITE_OTHER): Payer: Medicare Other | Admitting: Internal Medicine

## 2018-01-10 VITALS — BP 126/68 | HR 57 | Temp 98.2°F | Resp 16 | Ht 70.0 in | Wt 228.5 lb

## 2018-01-10 DIAGNOSIS — J40 Bronchitis, not specified as acute or chronic: Secondary | ICD-10-CM | POA: Diagnosis not present

## 2018-01-10 DIAGNOSIS — J9801 Acute bronchospasm: Secondary | ICD-10-CM

## 2018-01-10 MED ORDER — PREDNISONE 10 MG PO TABS: ORAL_TABLET | ORAL | 0 refills | Status: DC

## 2018-01-10 MED ORDER — DOXYCYCLINE HYCLATE 100 MG PO TABS: 100.0000 mg | ORAL_TABLET | Freq: Two times a day (BID) | ORAL | 0 refills | Status: DC

## 2018-01-10 NOTE — Progress Notes (Signed)
Subjective:    Patient ID: Terri Mckee, female    DOB: [birth year], 77 y.o.   MRN: [id number]  DOS:  01/10/2018 Type of visit - description: acute Sx started 1 weeks ago: Cough, thick yellow sputum, wheezing and chest congestion. Has taken Delsym OTC, has inhalers but has used them sporadically.  Review of Systems  Denies fever chills or sick contacts No shortness of breath, chest pain or lower extremity edema  Past Medical History:  Diagnosis Date  . 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 Acute upper respiratory infection 04/09/2014  . Anemia   . Arthritis    back  . 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 Cataract   . Cataracts, bilateral 07/25/2015  . Cervical cancer screening 07/07/2012  . Colon polyps   . Elevated serum creatinine    with ACE inhibitor (Normal MRA of renal arteries)  . GERD (gastroesophageal reflux disease)   . History of cyst of breast   . Hoarseness of voice 03/13/2012  . Hypertension   . Medicare annual wellness visit, subsequent 09/02/2014  . Muscle spasm of left lower extremity 03/21/2007   Qualifier: Diagnosis of  By: Wynona Luna   . Osteopenia 08/17/2014  . Preventative health care 04/05/2016  . Skin lesion 07/10/2012  . Urinary incontinence 07/10/2012  . Vitamin D deficiency 04/03/2016    Past Surgical History:  Procedure Laterality Date  . APPENDECTOMY    . BREAST CYST EXCISION Bilateral    right breast, twice  . OVARY SURGERY     right ovary & tube replacement  . WRIST GANGLION EXCISION     rt    Social History   Socioeconomic History  . Marital status: Married    Spouse name: Not on file  . Number of children: Not on file  . Years of education: Not on file  . Highest education level: Not on file  Occupational History  . Not on file  Social Needs  . Financial resource strain: Not on file  . Food insecurity:    Worry: Not on file    Inability: Not on file  .  Transportation needs:    Medical: Not on file    Non-medical: Not on file  Tobacco Use  . Smoking status: Former Smoker    Last attempt to quit: 01/13/1991    Years since quitting: 27.0  . Smokeless tobacco: Never Used  . Tobacco comment: quit 20 years ago 1/2 ppd x 40 years  Substance and Sexual Activity  . Alcohol use: No  . Drug use: No  . Sexual activity: Never    Comment: lives with husband, no dietary restrictions,   Lifestyle  . Physical activity:    Days per week: Not on file    Minutes per session: Not on file  . Stress: Not on file  Relationships  . Social connections:    Talks on phone: Not on file    Gets together: Not on file    Attends religious service: Not on file    Active member of club or organization: Not on file    Attends meetings of clubs or organizations: Not on file    Relationship status: Not on file  . Intimate partner violence:    Fear of current or ex partner: Not on file    Emotionally abused: Not on file    Physically abused: Not on file  Forced sexual activity: Not on file  Other Topics Concern  . Not on file  Social History Narrative   Former Smoker - quit 20 years ago (1/2 ppd x 40 years)   Widowed   1 Daughter           Allergies as of 01/10/2018      Reactions   Allegra [fexofenadine] Shortness Of Breath   Erythromycin Nausea Only   Oxycodone-acetaminophen Nausea And Vomiting   Propoxyphene N-acetaminophen Nausea And Vomiting     severe vomiting   Zocor [simvastatin] Other (See Comments)     Myalgia      Medication List       Accurate as of January 10, 2018  5:43 PM. Always use your most recent med list.        acetaminophen-codeine 300-30 MG tablet Commonly known as:  TYLENOL #3 Take 1-2 tablets by mouth every 4 (four) hours as needed for moderate pain or severe pain.   albuterol 108 (90 Base) MCG/ACT inhaler Commonly known as:  PROVENTIL HFA;VENTOLIN HFA Inhale 2 puffs into the lungs every 6 (six) hours as needed  for wheezing or shortness of breath.   amLODipine 10 MG tablet Commonly known as:  NORVASC Take 1 tablet (10 mg total) by mouth daily.   aspirin 81 MG tablet Take 81 mg by mouth daily.   budesonide-formoterol 80-4.5 MCG/ACT inhaler Commonly known as:  SYMBICORT INHALE 2 PUFFS INTO THE LUNGS 2 (TWO) TIMES DAILY.   calcium carbonate 600 MG Tabs tablet Commonly known as:  OS-CAL Take 600 mg by mouth daily.   doxycycline 100 MG tablet Commonly known as:  VIBRA-TABS Take 1 tablet (100 mg total) by mouth 2 (two) times daily.   famotidine 20 MG tablet Commonly known as:  PEPCID TAKE 1 TABLET(20 MG) BY MOUTH TWICE DAILY   Garlic 086 MG Caps Take 1 capsule by mouth daily.   Krill Oil 1000 MG Caps Take 1,000 capsules by mouth daily.   levocetirizine 5 MG tablet Commonly known as:  XYZAL Take 1 tablet (5 mg total) by mouth every evening.   multivitamin tablet Take 1 tablet by mouth daily.   omeprazole 20 MG capsule Commonly known as:  PRILOSEC Take 1 capsule (20 mg total) by mouth daily as needed.   predniSONE 10 MG tablet Commonly known as:  DELTASONE 2 tabs a day x 5 days   Vitamin D (Ergocalciferol) 1.25 MG (50000 UT) Caps capsule Commonly known as:  DRISDOL TAKE 1 CAPSULE (50,000 UNITS TOTAL) BY MOUTH EVERY 7 (SEVEN) DAYS.           Objective:   Physical Exam BP 126/68 (BP Location: Left Arm, Patient Position: Sitting, Cuff Size: Normal)   Pulse (!) 57   Temp 98.2 F (36.8 C) (Oral)   Resp 16   Ht 5\' 10"  (1.778 m)   Wt 228 lb 8 oz (103.6 kg)   SpO2 96%   BMI 32.79 kg/m  General:   Well developed, NAD, BMI noted. HEENT:  Normocephalic . Face symmetric, atraumatic.  TMs normal.  Throat symmetric and not red.  Nose with minimal congestion Lungs:  Few rhonchi and large airway congestion with cough.  Mild end expiratory wheezing. Normal respiratory effort, no intercostal retractions, no accessory muscle use. Heart: RRR,  no murmur.  No pretibial edema  bilaterally  Skin: Not pale. Not jaundice Neurologic:  alert & oriented X3.  Speech normal, gait appropriate for age and unassisted Psych--  Cognition and judgment appear  intact.  Cooperative with normal attention span and concentration.  Behavior appropriate. No anxious or depressed appearing.      Assessment     77 year old female, PMH includes HTN, GERD, osteopenia, bronchospasm presents with  Bronchitis, bronchospasm: Recommend consistent use of Symbicort, albuterol as needed Doxycycline & prednisone for few days. Call if not better, see AVS.

## 2018-01-10 NOTE — Progress Notes (Signed)
Pre visit review using our clinic review tool, if applicable. No additional management support is needed unless otherwise documented below in the visit note. 

## 2018-01-10 NOTE — Patient Instructions (Addendum)
Rest, fluids , tylenol  For cough:  Take Mucinex DM twice a day as needed until better  Inhalers: Use Symbicort 2 puffs twice a day every day until you are better Keep albuterol to use on as-needed basis if you are coughing or wheezing  Take the antibiotic as prescribed, doxycycline  Take prednisone for 5 days  Call if not gradually better over the next  10 days  Call anytime if the symptoms are severe

## 2018-01-26 ENCOUNTER — Telehealth: Payer: Self-pay | Admitting: *Deleted

## 2018-01-26 NOTE — Telephone Encounter (Signed)
Received Complete Eye Exam Report from [facility]; forwarded to provider/SLS 01/15

## 2018-02-26 ENCOUNTER — Encounter (HOSPITAL_BASED_OUTPATIENT_CLINIC_OR_DEPARTMENT_OTHER): Payer: Self-pay | Admitting: Emergency Medicine

## 2018-02-26 ENCOUNTER — Other Ambulatory Visit: Payer: Self-pay

## 2018-02-26 ENCOUNTER — Emergency Department (HOSPITAL_BASED_OUTPATIENT_CLINIC_OR_DEPARTMENT_OTHER)
Admission: EM | Admit: 2018-02-26 | Discharge: 2018-02-26 | Disposition: A | Discharge status: Home / Self Care | Payer: Medicare Other | Attending: Emergency Medicine | Admitting: Emergency Medicine

## 2018-02-26 ENCOUNTER — Emergency Department (HOSPITAL_BASED_OUTPATIENT_CLINIC_OR_DEPARTMENT_OTHER): Payer: Medicare Other

## 2018-02-26 DIAGNOSIS — R0602 Shortness of breath: Secondary | ICD-10-CM | POA: Diagnosis not present

## 2018-02-26 DIAGNOSIS — Z87891 Personal history of nicotine dependence: Secondary | ICD-10-CM | POA: Diagnosis not present

## 2018-02-26 DIAGNOSIS — I10 Essential (primary) hypertension: Secondary | ICD-10-CM | POA: Insufficient documentation

## 2018-02-26 DIAGNOSIS — R252 Cramp and spasm: Secondary | ICD-10-CM | POA: Insufficient documentation

## 2018-02-26 DIAGNOSIS — M7918 Myalgia, other site: Secondary | ICD-10-CM | POA: Diagnosis present

## 2018-02-26 LAB — CBC WITH DIFFERENTIAL/PLATELET
Abs Immature Granulocytes: 0.03 10*3/uL (ref 0.00–0.07)
Basophils Absolute: 0 10*3/uL (ref 0.0–0.1)
Basophils Relative: 1 %
EOS PCT: 3 %
Eosinophils Absolute: 0.2 10*3/uL (ref 0.0–0.5)
HCT: 39.2 % (ref 36.0–46.0)
Hemoglobin: 12.4 g/dL (ref 12.0–15.0)
Immature Granulocytes: 0 %
Lymphocytes Relative: 27 %
Lymphs Abs: 2.1 10*3/uL (ref 0.7–4.0)
MCH: 28.4 pg (ref 26.0–34.0)
MCHC: 31.6 g/dL (ref 30.0–36.0)
MCV: 89.9 fL (ref 80.0–100.0)
MONO ABS: 0.9 10*3/uL (ref 0.1–1.0)
Monocytes Relative: 12 %
Neutro Abs: 4.5 10*3/uL (ref 1.7–7.7)
Neutrophils Relative %: 57 %
PLATELETS: 181 10*3/uL (ref 150–400)
RBC: 4.36 MIL/uL (ref 3.87–5.11)
RDW: 14.6 % (ref 11.5–15.5)
WBC: 7.7 10*3/uL (ref 4.0–10.5)
nRBC: 0 % (ref 0.0–0.2)

## 2018-02-26 LAB — URINALYSIS, ROUTINE W REFLEX MICROSCOPIC
Bilirubin Urine: NEGATIVE
Glucose, UA: NEGATIVE mg/dL
Ketones, ur: NEGATIVE mg/dL
Leukocytes,Ua: NEGATIVE
Nitrite: NEGATIVE
Protein, ur: NEGATIVE mg/dL
Specific Gravity, Urine: 1.015 (ref 1.005–1.030)
pH: 6 (ref 5.0–8.0)

## 2018-02-26 LAB — URINALYSIS, MICROSCOPIC (REFLEX)

## 2018-02-26 LAB — COMPREHENSIVE METABOLIC PANEL
ALT: 12 U/L (ref 0–44)
AST: 13 U/L — ABNORMAL LOW (ref 15–41)
Albumin: 3.5 g/dL (ref 3.5–5.0)
Alkaline Phosphatase: 52 U/L (ref 38–126)
Anion gap: 7 (ref 5–15)
BUN: 17 mg/dL (ref 8–23)
CO2: 24 mmol/L (ref 22–32)
Calcium: 8.5 mg/dL — ABNORMAL LOW (ref 8.9–10.3)
Chloride: 107 mmol/L (ref 98–111)
Creatinine, Ser: 0.92 mg/dL (ref 0.44–1.00)
GFR calc non Af Amer: 60 mL/min — ABNORMAL LOW (ref >60)
Glucose, Bld: 88 mg/dL (ref 70–99)
Potassium: 3.8 mmol/L (ref 3.5–5.1)
Sodium: 138 mmol/L (ref 135–145)
TOTAL PROTEIN: 6.9 g/dL (ref 6.5–8.1)
Total Bilirubin: 0.7 mg/dL (ref 0.3–1.2)

## 2018-02-26 LAB — MAGNESIUM: MAGNESIUM: 2.1 mg/dL (ref 1.7–2.4)

## 2018-02-26 LAB — CK: Total CK: 102 U/L (ref 38–234)

## 2018-02-26 LAB — INFLUENZA PANEL BY PCR (TYPE A & B)
Influenza A By PCR: NEGATIVE
Influenza B By PCR: NEGATIVE

## 2018-02-26 LAB — TROPONIN I: Troponin I: <0.03 ng/mL (ref <0.03)

## 2018-02-26 MED ORDER — SODIUM CHLORIDE 0.9 % IV BOLUS
500.0000 mL | Freq: Once | INTRAVENOUS | Status: AC
  Administered 2018-02-26: 500 mL via INTRAVENOUS

## 2018-02-26 MED ORDER — DICYCLOMINE HCL 20 MG PO TABS: 20.0000 mg | ORAL_TABLET | Freq: Three times a day (TID) | ORAL | 0 refills | Status: DC | PRN

## 2018-02-26 MED ORDER — OSELTAMIVIR PHOSPHATE 75 MG PO CAPS: 75.0000 mg | ORAL_CAPSULE | Freq: Two times a day (BID) | ORAL | 0 refills | Status: AC

## 2018-02-26 NOTE — ED Triage Notes (Signed)
Pt here with generalized body aches and cramping since yesterday morning. Denies any further symptoms.

## 2018-02-26 NOTE — ED Notes (Signed)
ED Provider at bedside. 

## 2018-02-26 NOTE — Discharge Instructions (Signed)
Drink plenty of fluids. Your flu test is pending. If positive, you can fill the Tamiflu prescription provided and start this medication. Your Calcium is slightly low. You can take a Tums daily for the next 1-2 weeks and call your PCP for repeat blood testing. Return to the ED with any fever, chills, chest pain, or new/worsening symptoms.

## 2018-02-26 NOTE — ED Provider Notes (Signed)
Emergency Department Provider Note   I have reviewed the triage vital signs and the nursing notes.   HISTORY  Chief Complaint Generalized Body Aches   HPI Terri Mckee is a 78 y.o. female patient presents to the emergency department for evaluation of generalized body aches and dyspnea.  No specific chest pain.  No runny nose, congestion, cough, headache, sore throat.  Patient states that she is feeling otherwise well.  She has been taking Robaxin at home with no relief in symptoms.  She continues to urinate normally.  Patient has had intolerance to statins in the past and in review of the patient's medication list she is no longer taking one.  No radiation of symptoms or other modifying factors.   Past Medical History:  Diagnosis Date  . 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 Acute upper respiratory infection 04/09/2014  . Anemia   . Arthritis    back  . 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 Cataract   . Cataracts, bilateral 07/25/2015  . Cervical cancer screening 07/07/2012  . Colon polyps   . Elevated serum creatinine    with ACE inhibitor (Normal MRA of renal arteries)  . GERD (gastroesophageal reflux disease)   . History of cyst of breast   . Hoarseness of voice 03/13/2012  . Hypertension   . Medicare annual wellness visit, subsequent 09/02/2014  . Muscle spasm of left lower extremity 03/21/2007   Qualifier: Diagnosis of  By: Wynona Luna   . Osteopenia 08/17/2014  . Preventative health care 04/05/2016  . Skin lesion 07/10/2012  . Urinary incontinence 07/10/2012  . Vitamin D deficiency 04/03/2016    Patient Active Problem List   Diagnosis Date Noted  . Muscle cramp, nocturnal 06/14/2017  . Preventative health care 04/05/2016  . Vitamin D deficiency 04/03/2016  . Cataracts, bilateral 07/25/2015  . Left shoulder pain 01/29/2015  . Breast cancer screening 09/02/2014  . Decreased visual acuity  09/02/2014  . Hearing loss 09/02/2014  . Right hip pain 09/02/2014  . Medicare annual wellness visit, subsequent 09/02/2014  . Osteopenia 08/17/2014  . Right knee pain 09/24/2013  . Low back pain 12/03/2012  . Skin lesion 07/10/2012  . Urinary incontinence 07/10/2012  . Cervical cancer screening 07/07/2012  . Cough 04/01/2012  . Hoarseness of voice 03/13/2012  . Cervical radiculopathy 12/03/2011  . GERD 09/13/2009  . BREAST MASS, LEFT 02/22/2009  . HIP PAIN, RIGHT, CHRONIC 12/21/2008  . 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 Hyperlipidemia, mixed 10/28/2007  . INSOMNIA 10/28/2007  . Allergic rhinitis 07/01/2007  . WRIST PAIN, RIGHT 03/21/2007  . Muscle spasm of left lower extremity 03/21/2007  . Essential hypertension 08/06/2006  . 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 APPENDECTOMY    . BREAST CYST EXCISION Bilateral    right breast, twice  . OVARY SURGERY     right ovary & tube replacement  . WRIST GANGLION EXCISION     rt   Allergies Allegra [fexofenadine]; Erythromycin; Oxycodone-acetaminophen; Propoxyphene n-acetaminophen; and Zocor [simvastatin]  Family History  Problem Relation Age of Onset  . Brain cancer Mother        deceased age 31  . Alcohol abuse Mother   . Other Brother        died of sepsis  . Gout Brother   . Alcohol abuse Father   . Cancer Sister  throat  . Pulmonary embolism Daughter   . Mental illness Daughter        anxiety  . Diabetes Maternal Aunt   . Diabetes Maternal Aunt   . Seizures Maternal Aunt   . Birth defects Maternal Aunt   . Lupus Grandchild   . Sickle cell anemia Grandchild   . Colon cancer Neg Hx   . Breast cancer Neg Hx     Social History Social History   Tobacco Use  . Smoking status: Former Smoker    Last attempt to quit: 01/13/1991    Years since quitting: 27.1  . Smokeless tobacco: Never Used  . Tobacco comment: quit 20 years ago 1/2 ppd x 40 years  Substance Use  Topics  . Alcohol use: No  . Drug use: No    Review of Systems  Constitutional: No fever/chills. Positive body aches.  Eyes: No visual changes. ENT: No sore throat. Cardiovascular: Denies chest pain. Respiratory: Positive shortness of breath. Gastrointestinal: No abdominal pain.  No nausea, no vomiting.  No diarrhea.  No constipation. Genitourinary: Negative for dysuria. Musculoskeletal: Negative for back pain. Skin: Negative for rash. Neurological: Negative for headaches, focal weakness or numbness.  10-point ROS otherwise negative.  ____________________________________________   PHYSICAL EXAM:  VITAL SIGNS: ED Triage Vitals  Enc Vitals Group     BP 02/26/18 1143 (!) 141/82     Pulse Rate 02/26/18 1143 75     Resp 02/26/18 1143 18     Temp 02/26/18 1143 98 F (36.7 C)     Temp Source 02/26/18 1143 Oral     SpO2 02/26/18 1143 99 %     Weight 02/26/18 1141 235 lb (106.6 kg)     Height 02/26/18 1141 5\' 10"  (5.631 m)   Constitutional: Alert and oriented. Well appearing and in no acute distress. Eyes: Conjunctivae are normal. Head: Atraumatic. Nose: No congestion/rhinnorhea. Mouth/Throat: Mucous membranes are moist.  Oropharynx non-erythematous. Neck: No stridor.   Cardiovascular: Normal rate, regular rhythm. Good peripheral circulation. Grossly normal heart sounds.   Respiratory: Normal respiratory effort.  No retractions. Lungs CTAB. Gastrointestinal: Soft and nontender. No distention.  Musculoskeletal: No lower extremity tenderness nor edema. No gross deformities of extremities. Neurologic:  Normal speech and language. No gross focal neurologic deficits are appreciated.  Skin:  Skin is warm, dry and intact. No rash noted.  ____________________________________________   LABS (all labs ordered are listed, but only abnormal results are displayed)  Labs Reviewed  URINALYSIS, ROUTINE W REFLEX MICROSCOPIC - Abnormal; Notable for the following components:      Result  Value   Hgb urine dipstick SMALL (*)    All other components within normal limits  COMPREHENSIVE METABOLIC PANEL - Abnormal; Notable for the following components:   Calcium 8.5 (*)    AST 13 (*)    GFR calc non Af Amer 60 (*)    All other components within normal limits  URINALYSIS, MICROSCOPIC (REFLEX) - Abnormal; Notable for the following components:   Bacteria, UA RARE (*)    All other components within normal limits  TROPONIN I  CBC WITH DIFFERENTIAL/PLATELET  INFLUENZA PANEL BY PCR (TYPE A & B)  CK  MAGNESIUM   ____________________________________________  EKG   EKG Interpretation  Date/Time:  Saturday February 26 2018 12:10:23 EST Ventricular Rate:  74 PR Interval:    QRS Duration: 101 QT Interval:  418 QTC Calculation: 464 R Axis:   4 Text Interpretation:  Sinus rhythm Ventricular trigeminy No STEMI.  Confirmed  by Nanda Quinton [phone]) on 02/26/2018 12:29:21 PM       ____________________________________________  RADIOLOGY  Dg Chest 2 View  Result Date: 02/26/2018 CLINICAL DATA:  Generalized myalgias and cough that acutely began this morning. EXAM: CHEST - 2 VIEW COMPARISON:  02/07/2016 and earlier. FINDINGS: Cardiac silhouette normal in size, unchanged. Thoracic aorta mildly atherosclerotic, unchanged. Hilar and mediastinal contours otherwise unremarkable. Linear scarring in the RIGHT LOWER LOBE, unchanged since the 2018 examination. Lungs otherwise clear. Pulmonary vascularity normal. Bronchovascular markings normal. No visible pleural effusions. Mild degenerative changes involving the thoracic spine. IMPRESSION: No acute cardiopulmonary disease. Stable scarring involving the RIGHT LOWER LOBE. Electronically Signed   By: Evangeline Dakin M.D.   On: 02/26/2018 13:35    ____________________________________________   PROCEDURES  Procedure(s) performed:   Procedures  None ____________________________________________   INITIAL IMPRESSION / ASSESSMENT AND PLAN  / ED COURSE  Pertinent labs & imaging results that were available during my care of the patient were reviewed by me and considered in my medical decision making (see chart for details).  Patient presents to the emergency department with myalgias and mild shortness of breath.  Chest x-ray with no acute findings.  Flu testing sent and followed up which is negative.  Patient with no acute kidney injury, electrolyte abnormality other than mild hypocalcemia.  Doubt that this would be causing her symptoms but advised that she take Tums nightly for the next 1 to 2 weeks and follow-up with her primary care physician for recheck.  Plan to change her medications to Bentyl and encourage p.o. hydration at home.  CK is normal.  Discussed ED return precautions and PCP follow up plan. Provided a watch and wait Rx for Tamiflu but will only start meds if flu is positive.    ____________________________________________  FINAL CLINICAL IMPRESSION(S) / ED DIAGNOSES  Final diagnoses:  Muscle cramps  Shortness of breath     MEDICATIONS GIVEN DURING THIS VISIT:  Medications  sodium chloride 0.9 % bolus 500 mL (0 mLs Intravenous Stopped 02/26/18 1406)     NEW OUTPATIENT MEDICATIONS STARTED DURING THIS VISIT:  Discharge Medication List as of 02/26/2018  3:00 PM    START taking these medications   Details  dicyclomine (BENTYL) 20 MG tablet Take 1 tablet (20 mg total) by mouth 3 (three) times daily as needed for spasms., Starting Sat 02/26/2018, Normal    oseltamivir (TAMIFLU) 75 MG capsule Take 1 capsule (75 mg total) by mouth every 12 (twelve) hours for 5 days., Starting Sat 02/26/2018, Until Thu 03/03/2018, Print        Note:  This document was prepared using Dragon voice recognition software and may include unintentional dictation errors.  Nanda Quinton, MD Emergency Medicine    , W[facility], MD 02/26/18 Joen Laura

## 2018-03-08 ENCOUNTER — Encounter (INDEPENDENT_AMBULATORY_CARE_PROVIDER_SITE_OTHER): Payer: Self-pay

## 2018-03-15 ENCOUNTER — Encounter (INDEPENDENT_AMBULATORY_CARE_PROVIDER_SITE_OTHER): Payer: Self-pay | Admitting: Bariatrics

## 2018-03-15 ENCOUNTER — Ambulatory Visit (INDEPENDENT_AMBULATORY_CARE_PROVIDER_SITE_OTHER): Payer: Medicare Other | Admitting: Bariatrics

## 2018-03-15 VITALS — BP 117/73 | HR 67 | Temp 97.6°F | Ht 69.0 in | Wt 219.0 lb

## 2018-03-15 DIAGNOSIS — E559 Vitamin D deficiency, unspecified: Secondary | ICD-10-CM

## 2018-03-15 DIAGNOSIS — R5383 Other fatigue: Secondary | ICD-10-CM

## 2018-03-15 DIAGNOSIS — E669 Obesity, unspecified: Secondary | ICD-10-CM

## 2018-03-15 DIAGNOSIS — K219 Gastro-esophageal reflux disease without esophagitis: Secondary | ICD-10-CM

## 2018-03-15 DIAGNOSIS — I10 Essential (primary) hypertension: Secondary | ICD-10-CM

## 2018-03-15 DIAGNOSIS — Z6832 Body mass index (BMI) 32.0-32.9, adult: Secondary | ICD-10-CM

## 2018-03-15 DIAGNOSIS — E7849 Other hyperlipidemia: Secondary | ICD-10-CM

## 2018-03-15 DIAGNOSIS — R0602 Shortness of breath: Secondary | ICD-10-CM | POA: Diagnosis not present

## 2018-03-15 DIAGNOSIS — Z0289 Encounter for other administrative examinations: Secondary | ICD-10-CM

## 2018-03-15 DIAGNOSIS — R7309 Other abnormal glucose: Secondary | ICD-10-CM

## 2018-03-15 DIAGNOSIS — Z1331 Encounter for screening for depression: Secondary | ICD-10-CM

## 2018-03-15 NOTE — Progress Notes (Signed)
.  Office: [phone]  /  Fax: [phone]   HPI:   Chief Complaint: Terri Mckee (MR# [id number]) is a 78 y.o. female who presents on 03/15/2018 for obesity evaluation and treatment. Current BMI is Body mass index is 32.34 kg/m.Terri Mckee Vondra has struggled with obesity for years and has been unsuccessful in either losing weight or maintaining long term weight loss. [name] attended our information session and states she is currently in the action stage of change and ready to dedicate time achieving and maintaining a healthier weight.  Terri Mckee states her family eats meals together   Fatigue [name] feels her energy is lower than it should be. This has worsened with weight gain and has worsened recently. Terri Mckee denies daytime somnolence and  denies waking up still tired. Patient is at risk for obstructive sleep apnea. Patent has a history of symptoms of daytime fatigue. Patient generally gets 6 or 7 hours of sleep per night, and states they generally have difficulty falling asleep. Snoring is present. Apneic episodes is not present. Epworth Sleepiness Score is 5.   Dyspnea on exertion [name] notes increasing shortness of breath with exercising and seems to be worsening over time with weight gain. She notes getting out of breath sooner with activity than she used to. This has not gotten worse recently. [name] denies orthopnea.  Depression Screen Terri Mckee's Food and Mood (modified PHQ-9) score was  Depression screen PHQ 2/9 03/15/2018  Decreased Interest 1  Down, Depressed, Hopeless 0  PHQ - 2 Score 1  Altered sleeping 0  Tired, decreased energy 3  Change in appetite 0  Feeling bad or failure about yourself  0  Trouble concentrating 0  Moving slowly or fidgety/restless 0  Suicidal thoughts 0  PHQ-9 Score 4  Difficult doing work/chores Not difficult at all    ASSESSMENT AND PLAN:  Other fatigue  Shortness of breath on exertion  Essential  hypertension  Gastroesophageal reflux disease, esophagitis presence not specified  Other hyperlipidemia  Vitamin D deficiency - Plan: VITAMIN D 25 Hydroxy (Vit-D Deficiency, Fractures)  Elevated glucose - Plan: Hemoglobin A1c, Insulin, random  Depression screening  Class 1 obesity with serious comorbidity and body mass index (BMI) of 32.0 to 32.9 in adult, unspecified obesity type  PLAN:  Fatigue [name] was informed that her fatigue may be related to obesity, depression or many other causes. Labs will be ordered, and in the meanwhile [name] has agreed to work on diet, exercise and weight loss to help with fatigue. Proper sleep hygiene was discussed including the need for 7-8 hours of quality sleep each night. A sleep study was not ordered based on symptoms and Epworth score.  Dyspnea on exertion Terri Mckee's shortness of breath appears to be obesity related and exercise induced. She has agreed to work on weight loss and gradually increase exercise to treat her exercise induced shortness of breath. If [name] follows our instructions and loses weight without improvement of her shortness of breath, we will plan to refer to pulmonology. We will monitor this condition regularly. Terri Mckee agrees to this plan.  Depression Screen [name] had a moderately positive depression screening. Depression is commonly associated with obesity and often results in emotional eating behaviors. We will monitor this closely and work on CBT to help improve the non-hunger eating patterns. Referral to Psychology may be required if no improvement is seen as she continues in our clinic.  Risk counselling Discussed CBT techniques to counter depression. Asked her to contact someone if her depression  worsens.   Obesity Terri Mckee is currently in the action stage of change and her goal is to continue with weight loss efforts She has agreed to follow the Category 3 plan Terri Mckee has been instructed to work up to a goal of  150 minutes of combined cardio and strengthening exercise per week for weight loss and overall health benefits. We discussed the following Behavioral Modification Stratagies today: increasing lean protein intake  Terri Mckee has agreed to follow up with our clinic in 2 weeks. She was informed of the importance of frequent follow up visits to maximize her success with intensive lifestyle modifications for her multiple health conditions. She was informed we would discuss her lab results at her next visit unless there is a critical issue that needs to be addressed sooner. Terri Mckee agreed to keep her next visit at the agreed upon time to discuss these results.  ALLERGIES: Allergies  Allergen Reactions  . Allegra [Fexofenadine] Shortness Of Breath  . Erythromycin Nausea Only  . Oxycodone-Acetaminophen Nausea And Vomiting  . Propoxyphene N-Acetaminophen Nausea And Vomiting      severe vomiting  . Zocor [Simvastatin] Other (See Comments)      Myalgia    MEDICATIONS: Current Outpatient Medications on File Prior to Visit  Medication Sig Dispense Refill  . amLODipine (NORVASC) 10 MG tablet Take 1 tablet (10 mg total) by mouth daily. 90 tablet 1  . aspirin 81 MG tablet Take 81 mg by mouth daily.      . budesonide-formoterol (SYMBICORT) 80-4.5 MCG/ACT inhaler INHALE 2 PUFFS INTO THE LUNGS 2 (TWO) TIMES DAILY. 10.2 g 2  . calcium carbonate (OS-CAL) 600 MG TABS Take 600 mg by mouth daily.      Terri Mckee dicyclomine (BENTYL) 20 MG tablet Take 1 tablet (20 mg total) by mouth 3 (three) times daily as needed for spasms. 20 tablet 0  . famotidine (PEPCID) 20 MG tablet TAKE 1 TABLET(20 MG) BY MOUTH TWICE DAILY 180 tablet 1  . Garlic 812 MG CAPS Take 1 capsule by mouth daily.    Javier Docker Oil 1000 MG CAPS Take 1,000 capsules by mouth daily.    Terri Mckee levocetirizine (XYZAL) 5 MG tablet Take 1 tablet (5 mg total) by mouth every evening. 90 tablet 3  . Multiple Vitamin (MULTIVITAMIN) tablet Take 1 tablet by mouth daily.    Terri Mckee  omeprazole (PRILOSEC) 20 MG capsule Take 1 capsule (20 mg total) by mouth daily as needed. 30 capsule 5  . predniSONE (DELTASONE) 10 MG tablet 2 tabs a day x 5 days 10 tablet 0  . acetaminophen-codeine (TYLENOL #3) 300-30 MG tablet Take 1-2 tablets by mouth every 4 (four) hours as needed for moderate pain or severe pain. (Patient not taking: Reported on 03/15/2018) 40 tablet 0  . albuterol (PROVENTIL HFA;VENTOLIN HFA) 108 (90 Base) MCG/ACT inhaler Inhale 2 puffs into the lungs every 6 (six) hours as needed for wheezing or shortness of breath. (Patient not taking: Reported on 03/15/2018) 1 Inhaler 0  . doxycycline (VIBRA-TABS) 100 MG tablet Take 1 tablet (100 mg total) by mouth 2 (two) times daily. (Patient not taking: Reported on 03/15/2018) 15 tablet 0  . Vitamin D, Ergocalciferol, (DRISDOL) 50000 units CAPS capsule TAKE 1 CAPSULE (50,000 UNITS TOTAL) BY MOUTH EVERY 7 (SEVEN) DAYS. (Patient not taking: Reported on 03/15/2018) 4 capsule 1   No current facility-administered medications on file prior to visit.     PAST MEDICAL HISTORY: Past Medical History:  Diagnosis Date  . 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 Acute upper respiratory infection 04/09/2014  . Anemia   . Arthritis    back  . Back pain   . 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 Cataract   . Cataracts, bilateral 07/25/2015  . Cervical cancer screening 07/07/2012  . Colon polyps   . Elevated serum creatinine    with ACE inhibitor (Normal MRA of renal arteries)  . GERD (gastroesophageal reflux disease)   . History of cyst of breast   . Hoarseness of voice 03/13/2012  . Hypertension   . Lactose intolerance   . Medicare annual wellness visit, subsequent 09/02/2014  . Muscle spasm of left lower extremity 03/21/2007   Qualifier: Diagnosis of  By: Wynona Luna   . Osteoarthritis   . Osteopenia 08/17/2014  . Preventative health care 04/05/2016  . Shortness of breath   . Skin  lesion 07/10/2012  . Urinary incontinence 07/10/2012  . Vitamin D deficiency 04/03/2016    PAST SURGICAL HISTORY: Past Surgical History:  Procedure Laterality Date  . APPENDECTOMY    . BREAST CYST EXCISION Bilateral    right breast, twice  . OVARY SURGERY     right ovary & tube replacement  . WRIST GANGLION EXCISION     rt    SOCIAL HISTORY: Social History   Tobacco Use  . Smoking status: Former Smoker    Last attempt to quit: 01/13/1991    Years since quitting: 27.1  . Smokeless tobacco: Never Used  . Tobacco comment: quit 20 years ago 1/2 ppd x 40 years  Substance Use Topics  . Alcohol use: No  . Drug use: No    FAMILY HISTORY: Family History  Problem Relation Age of Onset  . Brain cancer Mother        deceased age 67  . Alcohol abuse Mother   . Obesity Mother   . Other Brother        died of sepsis  . Gout Brother   . Alcohol abuse Father   . Cancer Sister        throat  . Pulmonary embolism Daughter   . Mental illness Daughter        anxiety  . Diabetes Maternal Aunt   . Diabetes Maternal Aunt   . Seizures Maternal Aunt   . Birth defects Maternal Aunt   . Lupus Grandchild   . Sickle cell anemia Grandchild   . Colon cancer Neg Hx   . Breast cancer Neg Hx     ROS: ROS  PHYSICAL EXAM: Blood pressure 117/73, pulse 67, temperature 97.6 F (36.4 C), temperature source Oral, height 5\' 9"  (1.753 m), weight 219 lb (99.3 kg), SpO2 98 %. Body mass index is 32.34 kg/m. Physical Exam  RECENT LABS AND TESTS: BMET    Component Value Date/Time   NA 138 02/26/2018 1332   K 3.8 02/26/2018 1332   CL 107 02/26/2018 1332   CO2 24 02/26/2018 1332   GLUCOSE 88 02/26/2018 1332   BUN 17 02/26/2018 1332   CREATININE 0.92 02/26/2018 1332   CREATININE 1.14 (H) 07/18/2012 1234   CALCIUM 8.5 (L) 02/26/2018 1332   GFRNONAA 60 (L) 02/26/2018 1332   GFRAA >60 02/26/2018 1332   No results found for: HGBA1C No results found for: INSULIN CBC    Component Value  Date/Time   WBC 7.7 02/26/2018 1229   RBC 4.36 02/26/2018 1229   HGB 12.4 02/26/2018 1229  HCT 39.2 02/26/2018 1229   PLT 181 02/26/2018 1229   MCV 89.9 02/26/2018 1229   MCH 28.4 02/26/2018 1229   MCHC 31.6 02/26/2018 1229   RDW 14.6 02/26/2018 1229   LYMPHSABS 2.1 02/26/2018 1229   MONOABS 0.9 02/26/2018 1229   EOSABS 0.2 02/26/2018 1229   BASOSABS 0.0 02/26/2018 1229   Iron/TIBC/Ferritin/ %Sat No results found for: IRON, TIBC, FERRITIN, IRONPCTSAT Lipid Panel     Component Value Date/Time   CHOL 178 10/18/2017 1618   TRIG 140.0 10/18/2017 1618   HDL 41.50 10/18/2017 1618   CHOLHDL 4 10/18/2017 1618   VLDL 28.0 10/18/2017 1618   LDLCALC 109 (H) 10/18/2017 1618   Hepatic Function Panel     Component Value Date/Time   PROT 6.9 02/26/2018 1332   ALBUMIN 3.5 02/26/2018 1332   AST 13 (L) 02/26/2018 1332   ALT 12 02/26/2018 1332   ALKPHOS 52 02/26/2018 1332   BILITOT 0.7 02/26/2018 1332   BILIDIR 0.1 09/21/2013 1624   IBILI 0.2 03/08/2012 1337      Component Value Date/Time   TSH 2.09 10/18/2017 1618   Vitamin D Results for ANISTEN, TOMASSI (MRN [id number]) as of 03/15/2018 16:24  Ref. Range 06/14/2017 11:09  VITD Latest Ref Range: 30.00 - 100.00 ng/mL 44.23    ECG  shows NSR with a rate of 74.  INDIRECT CALORIMETER done today shows a VO2 of 271 and a REE of 1884.   Her calculated basal metabolic rate is 0932 thus her basal metabolic rate is better than expected.       OBESITY BEHAVIORAL INTERVENTION VISIT  Today's visit was # 1   Starting weight: 231.2 Starting date: 03/15/2018.  Today's weight : @FLOWAMB (14)@  Today's date: 03/15/2018 Total lbs lost to date: 0 At least 15 minutes were spent on discussing the following behavioral intervention visit.    03/15/2018  Height 5\' 9"  (1.753 m)  Weight 219 lb (99.3 kg)  BMI (Calculated) 32.33  BLOOD PRESSURE - SYSTOLIC 671  BLOOD PRESSURE - DIASTOLIC 73  Waist Measurement  45 inches   Body Fat % 46.7 %   Total Body Water (lbs) 75 lbs  RMR 1272    ASK: We discussed the diagnosis of obesity with Levi Aland today and Neha agreed to give [place] permission to discuss obesity behavioral modification therapy today.  ASSESS: Zamarah has the diagnosis of obesity and her BMI today is @TBMI @ Meleane is in the action stage of change   ADVISE: Eller was educated on the multiple health risks of obesity as well as the benefit of weight loss to improve her health. She was advised of the need for long term treatment and the importance of lifestyle modifications to improve her current health and to decrease her risk of future health problems.  AGREE: Multiple dietary modification options and treatment options were discussed and  Eron agreed to follow the recommendations documented in the above note.  ARRANGE: Railee was educated on the importance of frequent visits to treat obesity as outlined per CMS and USPSTF guidelines and agreed to schedule her next follow up appointment today.   IMichaelene Song, am acting as Location manager for [employer], DO.   I have reviewed the above documentation for accuracy and completeness, and I agree with the above. -Jearld Lesch, DO

## 2018-03-15 NOTE — Progress Notes (Signed)
.  Office: [phone]  /  Fax: [phone]   HPI:   Chief Complaint: Terri Mckee (MR# [id number]) is a 78 y.o. female who presents on 03/15/2018 for obesity evaluation and treatment. Current BMI is Body mass index is 32.34 kg/m.Marland Kitchen Marshay has struggled with obesity for years and has been unsuccessful in either losing weight or maintaining long term weight loss. Edee attended our information session and states she is currently in the action stage of change and ready to dedicate time achieving and maintaining a healthier weight.  Samari states her family eats meals together her desired weight loss is 19 lbs. she started gaining weight after she quit smoking her heaviest weight ever was 232 lbs. she has significant food cravings issues  she skips meals frequently she is frequently drinking liquids with calories she has problems with excessive hunger  she has binge eating behaviors   Fatigue Penelopi feels her energy is lower than it should be. This has worsened with weight gain and has not worsened recently. Kaylanie admits to daytime somnolence and she admits to waking up still tired. Patient is at risk for obstructive sleep apnea. Patent has a history of symptoms of daytime fatigue, morning fatigue and hypertension. Patient generally gets 3 hours of sleep per night, and states they generally have restless sleep. Snoring is present. Apneic episodes are not present. Epworth Sleepiness Score is 6  Dyspnea on exertion Melvine notes increasing shortness of breath with certain activities (climbing stairs, walking fast) and seems to be worsening over time with weight gain. She notes getting out of breath sooner with activity than she used to. This has not gotten worse recently. Keina denies orthopnea.  Hypertension LYNDEE HERBST is a 78 y.o. female with hypertension. She is currently taking Norvasc. Levi Aland denies chest pain. She is working weight loss to  help control her blood pressure with the goal of decreasing her risk of heart attack and stroke. Dorothys blood pressure is well controlled.  GERD (gastroesophageal reflux disease) Danaye has a diagnosis of gastroesophageal reflux disease and she is taking Pepcid and Prilosec. Her signs and symptoms are well controlled.  Hyperlipidemia Maisyn has hyperlipidemia and she is not on medications. Her last LDL was at 109 10/18/17.She is attempting to improve her cholesterol levels with intensive lifestyle modification including a low saturated fat diet, exercise and weight loss. She denies myalgias.  Vitamin D deficiency Lula has a diagnosis of vitamin D deficiency. She is currently taking OTC vit D. Shakeela was previously taking high dose vitamin D. She denies nausea, vomiting or muscle weakness.  Elevated Glucose Dahlila has a history of some elevated blood glucose readings without a diagnosis of diabetes. She denies polyphagia, polyuria or polydipsia.  Depression Screen Lamiah's Food and Mood (modified PHQ-9) score was  Depression screen PHQ 2/9 03/15/2018  Decreased Interest 1  Down, Depressed, Hopeless 0  PHQ - 2 Score 1  Altered sleeping 0  Tired, decreased energy 3  Change in appetite 0  Feeling bad or failure about yourself  0  Trouble concentrating 0  Moving slowly or fidgety/restless 0  Suicidal thoughts 0  PHQ-9 Score 4  Difficult doing work/chores Not difficult at all    ASSESSMENT AND PLAN:  Other fatigue  Shortness of breath on exertion  Essential hypertension  Gastroesophageal reflux disease, esophagitis presence not specified  Other hyperlipidemia  Vitamin D deficiency - Plan: VITAMIN D 25 Hydroxy (Vit-D Deficiency, Fractures)  Elevated glucose - Plan: Hemoglobin A1c,  Insulin, random  Depression screening  Class 1 obesity with serious comorbidity and body mass index (BMI) of 32.0 to 32.9 in adult, unspecified obesity type  PLAN:  Fatigue Clessie was  informed that her fatigue may be related to obesity, depression or many other causes. Labs will be ordered, and in the meanwhile Cena has agreed to work on diet, exercise and weight loss to help with fatigue. Proper sleep hygiene was discussed including the need for 7-8 hours of quality sleep each night. A sleep study was not ordered based on symptoms and Epworth score.  Dyspnea on exertion Vaidehi's shortness of breath appears to be obesity related and exercise induced. She has agreed to work on weight loss and gradually increase exercise to treat her exercise induced shortness of breath. If Parminder follows our instructions and loses weight without improvement of her shortness of breath, we will plan to refer to pulmonology. We will monitor this condition regularly. Sherlynn agrees to this plan.  Hypertension We discussed sodium restriction, working on healthy weight loss, and a regular exercise program as the means to achieve improved blood pressure control. Bianca agreed with this plan and agreed to follow up as directed. We will continue to monitor her blood pressure as well as her progress with the above lifestyle modifications. She will continue her medications as prescribed and will watch for signs of hypotension as she continues her lifestyle modifications.  GERD (gastroesophageal reflux disease) Danaye will continue her medications. We discussed weight loss and how this can improve GERD signs and symptoms.  Hyperlipidemia Yvanna was informed of the American Heart Association Guidelines emphasizing intensive lifestyle modifications as the first line treatment for hyperlipidemia. We discussed many lifestyle modifications today in depth. Tonita will continue to control her cholesterol with diet. She will start to work on decreasing saturated fats such as fatty red meat, butter and many fried foods. She will also increase vegetables and lean protein in her diet and start to work on exercise  and weight loss efforts.  Vitamin D Deficiency Iyanni was informed that low vitamin D levels contributes to fatigue and are associated with obesity, breast, and colon cancer. She agrees to continue to take OTC Vit D and will follow up for routine testing of vitamin D, at least 2-3 times per year. She was informed of the risk of over-replacement of vitamin D and agrees to not increase her dose unless she discusses this with [place] first. We will check vitamin D level today and Henslee will follow up as directed.  Elevated Glucose Fasting labs will be obtained (Hgb A1c, insulin level) and results with be discussed with Earlie Server in 2 weeks at her follow up visit. In the meanwhile Shatara was started on a lower simple carbohydrate diet and will work on weight loss efforts.  Depression Screen Marylynne had a negative depression screening. Depression is commonly associated with obesity and often results in emotional eating behaviors. We will monitor this closely and work on CBT to help improve the non-hunger eating patterns.  Obesity Katrese is currently in the action stage of change and her goal is to continue with weight loss efforts She has agreed to follow the Category 1 plan Juvia has been instructed to work up to a goal of 150 minutes of combined cardio and strengthening exercise per week for weight loss and overall health benefits. We discussed the following Behavioral Modification Strategies today: increase H2O intake, no skipping meals, keeping healthy foods in the home, healthy snacks, increasing lean protein  intake, decreasing simple carbohydrates, increasing vegetables, decrease eating out, work on meal planning and easy cooking plans and decrease liquid calories  Jazalyn has agreed to follow up with our clinic in 2 weeks. She was informed of the importance of frequent follow up visits to maximize her success with intensive lifestyle modifications for her multiple health conditions. She was  informed we would discuss her lab results at her next visit unless there is a critical issue that needs to be addressed sooner. Milania agreed to keep her next visit at the agreed upon time to discuss these results.  ALLERGIES: Allergies  Allergen Reactions   Allegra [Fexofenadine] Shortness Of Breath   Erythromycin Nausea Only   Oxycodone-Acetaminophen Nausea And Vomiting   Propoxyphene N-Acetaminophen Nausea And Vomiting      severe vomiting   Zocor [Simvastatin] Other (See Comments)      Myalgia    MEDICATIONS: Current Outpatient Medications on File Prior to Visit  Medication Sig Dispense Refill   amLODipine (NORVASC) 10 MG tablet Take 1 tablet (10 mg total) by mouth daily. 90 tablet 1   aspirin 81 MG tablet Take 81 mg by mouth daily.       budesonide-formoterol (SYMBICORT) 80-4.5 MCG/ACT inhaler INHALE 2 PUFFS INTO THE LUNGS 2 (TWO) TIMES DAILY. 10.2 g 2   calcium carbonate (OS-CAL) 600 MG TABS Take 600 mg by mouth daily.       dicyclomine (BENTYL) 20 MG tablet Take 1 tablet (20 mg total) by mouth 3 (three) times daily as needed for spasms. 20 tablet 0   famotidine (PEPCID) 20 MG tablet TAKE 1 TABLET(20 MG) BY MOUTH TWICE DAILY 935 tablet 1   Garlic 701 MG CAPS Take 1 capsule by mouth daily.     Krill Oil 1000 MG CAPS Take 1,000 capsules by mouth daily.     levocetirizine (XYZAL) 5 MG tablet Take 1 tablet (5 mg total) by mouth every evening. 90 tablet 3   Multiple Vitamin (MULTIVITAMIN) tablet Take 1 tablet by mouth daily.     omeprazole (PRILOSEC) 20 MG capsule Take 1 capsule (20 mg total) by mouth daily as needed. 30 capsule 5   predniSONE (DELTASONE) 10 MG tablet 2 tabs a day x 5 days 10 tablet 0   acetaminophen-codeine (TYLENOL #3) 300-30 MG tablet Take 1-2 tablets by mouth every 4 (four) hours as needed for moderate pain or severe pain. (Patient not taking: Reported on 03/15/2018) 40 tablet 0   albuterol (PROVENTIL HFA;VENTOLIN HFA) 108 (90 Base) MCG/ACT  inhaler Inhale 2 puffs into the lungs every 6 (six) hours as needed for wheezing or shortness of breath. (Patient not taking: Reported on 03/15/2018) 1 Inhaler 0   doxycycline (VIBRA-TABS) 100 MG tablet Take 1 tablet (100 mg total) by mouth 2 (two) times daily. (Patient not taking: Reported on 03/15/2018) 15 tablet 0   Vitamin D, Ergocalciferol, (DRISDOL) 50000 units CAPS capsule TAKE 1 CAPSULE (50,000 UNITS TOTAL) BY MOUTH EVERY 7 (SEVEN) DAYS. (Patient not taking: Reported on 03/15/2018) 4 capsule 1   No current facility-administered medications on file prior to visit.     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   Cataract  Cataracts, bilateral 07/25/2015   Cervical cancer screening 07/07/2012   Colon polyps    Elevated serum creatinine    with ACE inhibitor (Normal MRA of renal arteries)   GERD (gastroesophageal reflux disease)    History of cyst of breast    Hoarseness of voice 03/13/2012   Hypertension    Lactose intolerance    Medicare annual wellness visit, subsequent 09/02/2014   Muscle spasm of left lower extremity 03/21/2007   Qualifier: Diagnosis of  By: Wynona Luna    Osteoarthritis    Osteopenia 08/17/2014   Preventative health care 04/05/2016   Shortness of breath    Skin lesion 07/10/2012   Urinary incontinence 07/10/2012   Vitamin D deficiency 04/03/2016    PAST SURGICAL HISTORY: Past Surgical History:  Procedure Laterality Date   APPENDECTOMY     BREAST CYST EXCISION Bilateral    right breast, twice   OVARY SURGERY     right ovary & tube replacement   WRIST GANGLION EXCISION     rt    SOCIAL HISTORY: Social History   Tobacco Use   Smoking status: Former Smoker    Last attempt to  quit: 01/13/1991    Years since quitting: 27.1   Smokeless tobacco: Never Used   Tobacco comment: quit 20 years ago 1/2 ppd x 40 years  Substance Use Topics   Alcohol use: No   Drug use: No    FAMILY HISTORY: Family History  Problem Relation Age of Onset   Brain cancer Mother        deceased age 67   Alcohol abuse Mother    Obesity Mother    Other Brother        died of sepsis   Gout Brother    Alcohol abuse Father    Cancer Sister        throat   Pulmonary embolism Daughter    Mental illness Daughter        anxiety   Diabetes Maternal Aunt    Diabetes Maternal Aunt    Seizures Maternal Aunt    Birth defects Maternal Aunt    Lupus Grandchild    Sickle cell anemia Grandchild    Colon cancer Neg Hx    Breast cancer Neg Hx     ROS: Review of Systems  Constitutional: Positive for malaise/fatigue.  HENT: Positive for hearing loss.        + Hay Fever + Swollen Glands (neck)  Eyes:       + Wear Glasses or Contacts  Respiratory: Positive for shortness of breath.   Cardiovascular: Negative for chest pain and orthopnea.       + Shortness of Breath with Activity + Leg Cramping  Gastrointestinal: Negative for nausea and vomiting.  Genitourinary: Positive for frequency.  Musculoskeletal: Positive for back pain. Negative for myalgias.       + Muscle or Joint Pain  Skin:       + Dryness  Endo/Heme/Allergies: Negative for polydipsia. Bruises/bleeds easily (bruising).       Negative for polyphagia  Psychiatric/Behavioral: The patient has insomnia.     PHYSICAL EXAM: Blood pressure 117/73, pulse 67, temperature 97.6 F (36.4 C), temperature source Oral, height 5\' 9"  (1.753 m), weight 219 lb (99.3 kg), SpO2 98 %. Body mass index is 32.34 kg/m. Physical Exam Vitals signs reviewed.  Constitutional:      Appearance: Normal appearance. She is well-developed. She is obese.  HENT:     Head: Normocephalic and atraumatic.  Nose: Nose normal.      Mouth/Throat:     Comments: Mallampati = 1 Eyes:     General: No scleral icterus.    Extraocular Movements: Extraocular movements intact.  Neck:     Musculoskeletal: Normal range of motion and neck supple.     Thyroid: No thyromegaly.  Cardiovascular:     Rate and Rhythm: Normal rate. Rhythm irregular.     Heart sounds: No murmur.     Comments: Mild irregular, but overall appears normal Pulmonary:     Effort: Pulmonary effort is normal. No respiratory distress.  Abdominal:     Palpations: Abdomen is soft.     Tenderness: There is no abdominal tenderness.  Musculoskeletal: Normal range of motion.  Skin:    General: Skin is warm and dry.  Neurological:     Mental Status: She is alert and oriented to person, place, and time.     Coordination: Coordination normal.  Psychiatric:        Mood and Affect: Mood normal.        Behavior: Behavior normal.     RECENT LABS AND TESTS: BMET    Component Value Date/Time   NA 138 02/26/2018 1332   K 3.8 02/26/2018 1332   CL 107 02/26/2018 1332   CO2 24 02/26/2018 1332   GLUCOSE 88 02/26/2018 1332   BUN 17 02/26/2018 1332   CREATININE 0.92 02/26/2018 1332   CREATININE 1.14 (H) 07/18/2012 1234   CALCIUM 8.5 (L) 02/26/2018 1332   GFRNONAA 60 (L) 02/26/2018 1332   GFRAA >60 02/26/2018 1332   No results found for: HGBA1C No results found for: INSULIN CBC    Component Value Date/Time   WBC 7.7 02/26/2018 1229   RBC 4.36 02/26/2018 1229   HGB 12.4 02/26/2018 1229   HCT 39.2 02/26/2018 1229   PLT 181 02/26/2018 1229   MCV 89.9 02/26/2018 1229   MCH 28.4 02/26/2018 1229   MCHC 31.6 02/26/2018 1229   RDW 14.6 02/26/2018 1229   LYMPHSABS 2.1 02/26/2018 1229   MONOABS 0.9 02/26/2018 1229   EOSABS 0.2 02/26/2018 1229   BASOSABS 0.0 02/26/2018 1229   Iron/TIBC/Ferritin/ %Sat No results found for: IRON, TIBC, FERRITIN, IRONPCTSAT Lipid Panel     Component Value Date/Time   CHOL 178 10/18/2017 1618   TRIG 140.0 10/18/2017 1618    HDL 41.50 10/18/2017 1618   CHOLHDL 4 10/18/2017 1618   VLDL 28.0 10/18/2017 1618   LDLCALC 109 (H) 10/18/2017 1618   Hepatic Function Panel     Component Value Date/Time   PROT 6.9 02/26/2018 1332   ALBUMIN 3.5 02/26/2018 1332   AST 13 (L) 02/26/2018 1332   ALT 12 02/26/2018 1332   ALKPHOS 52 02/26/2018 1332   BILITOT 0.7 02/26/2018 1332   BILIDIR 0.1 09/21/2013 1624   IBILI 0.2 03/08/2012 1337      Component Value Date/Time   TSH 2.09 10/18/2017 1618   Vitamin D Results for ALEXZANDRA, BILTON (MRN [id number]) as of 03/15/2018 21:05  Ref. Range 06/14/2017 11:09  VITD Latest Ref Range: 30.00 - 100.00 ng/mL 44.23    INDIRECT CALORIMETER done today shows a VO2 of 182 and a REE of 1272. Her calculated basal metabolic rate is 5852 thus her basal metabolic rate is worse than expected.       OBESITY BEHAVIORAL INTERVENTION VISIT  Today's visit was # 1   Starting weight: 219 lbs Starting date: 03/15/2018 Today's weight : 219 lbs Today's date: 03/15/2018 Total lbs lost  to date: 0 At least 15 minutes were spent on discussing the following behavioral intervention visit.    03/15/2018  Height 5\' 9"  (1.753 m)  Weight 219 lb (99.3 kg)  BMI (Calculated) 32.33  BLOOD PRESSURE - SYSTOLIC 884  BLOOD PRESSURE - DIASTOLIC 73  Waist Measurement  45 inches   Body Fat % 46.7 %  Total Body Water (lbs) 75 lbs  RMR 1272    ASK: We discussed the diagnosis of obesity with Levi Aland today and Yaris agreed to give [place] permission to discuss obesity behavioral modification therapy today.  ASSESS: Mairi has the diagnosis of obesity and her BMI today is 32.33 Romonia is in the action stage of change   ADVISE: Chantale was educated on the multiple health risks of obesity as well as the benefit of weight loss to improve her health. She was advised of the need for long term treatment and the importance of lifestyle modifications to improve her current health and to decrease her risk  of future health problems.  AGREE: Multiple dietary modification options and treatment options were discussed and  Renley agreed to follow the recommendations documented in the above note.  ARRANGE: Sosha was educated on the importance of frequent visits to treat obesity as outlined per CMS and USPSTF guidelines and agreed to schedule her next follow up appointment today.   Corey Skains, am acting as Location manager for [employer]. Owens Shark, DO

## 2018-03-16 DIAGNOSIS — E669 Obesity, unspecified: Secondary | ICD-10-CM | POA: Insufficient documentation

## 2018-03-16 DIAGNOSIS — Z6831 Body mass index (BMI) 31.0-31.9, adult: Secondary | ICD-10-CM

## 2018-03-16 LAB — INSULIN, RANDOM: INSULIN: 22.7 u[IU]/mL (ref 2.6–24.9)

## 2018-03-16 LAB — HEMOGLOBIN A1C
Est. average glucose Bld gHb Est-mCnc: 108 mg/dL
Hgb A1c MFr Bld: 5.4 % (ref 4.8–5.6)

## 2018-03-16 LAB — VITAMIN D 25 HYDROXY (VIT D DEFICIENCY, FRACTURES): Vit D, 25-Hydroxy: 44.3 ng/mL (ref 30.0–100.0)

## 2018-03-29 ENCOUNTER — Ambulatory Visit (INDEPENDENT_AMBULATORY_CARE_PROVIDER_SITE_OTHER): Payer: Medicare Other | Admitting: Bariatrics

## 2018-03-29 ENCOUNTER — Other Ambulatory Visit: Payer: Self-pay

## 2018-03-29 VITALS — BP 117/70 | HR 57 | Temp 97.7°F | Ht 69.0 in | Wt 215.0 lb

## 2018-03-29 DIAGNOSIS — E559 Vitamin D deficiency, unspecified: Secondary | ICD-10-CM | POA: Diagnosis not present

## 2018-03-29 DIAGNOSIS — E8881 Metabolic syndrome: Secondary | ICD-10-CM | POA: Diagnosis not present

## 2018-03-29 DIAGNOSIS — E669 Obesity, unspecified: Secondary | ICD-10-CM | POA: Diagnosis not present

## 2018-03-29 DIAGNOSIS — Z6831 Body mass index (BMI) 31.0-31.9, adult: Secondary | ICD-10-CM | POA: Diagnosis not present

## 2018-03-29 NOTE — Progress Notes (Signed)
Office: [phone]  /  Fax: [phone]   HPI:   Chief Complaint: OBESITY Terri Mckee is here to discuss her progress with her obesity treatment plan. She is on the Category 1 plan and is following her eating plan approximately 80 % of the time. She states she is exercising 0 minutes 0 times per week. Terri Mckee is doing well. She is not hungry on the plan. She did not have any unusual cravings and she did not struggle with the plan. Her weight is 215 lb (97.5 kg) today and has had a weight loss of 4 pounds over a period of 2 weeks since her last visit. She has lost 4 lbs since starting treatment with [place].  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 Although Terri Mckee's blood glucose readings are still under good control, insulin resistance puts her at greater risk of metabolic syndrome and diabetes. Her last A1c was at 5.4 and last insulin level was at 22.7 She is not taking medications currently and continues to work on diet and exercise to decrease risk of diabetes. Terri Mckee denies polyphagia.  Vitamin D deficiency Terri Mckee has a diagnosis of vitamin D deficiency. Terri Mckee was taking vit D, but she stopped taking it months ago. Her last vitamin D level was at 44.3. She denies nausea, vomiting or muscle weakness.  ASSESSMENT AND PLAN:  Insulin resistance  Vitamin D deficiency  Class 1 obesity with serious comorbidity and body mass index (BMI) of 31.0 to 31.9 in adult, unspecified obesity type  PLAN:  Insulin Resistance Terri Mckee will continue to work on weight loss, exercise, and decreasing simple carbohydrates in her diet to help decrease the risk of diabetes. We dicussed insulin resistance with myself and our registered dietician today. She was informed that eating too many simple carbohydrates or too many calories at one sitting increases the likelihood of GI side effects. Marigold agreed to follow up with [place] as directed to  monitor her progress.  Vitamin D Deficiency Terri Mckee was informed that low vitamin D levels contributes to fatigue and are associated with obesity, breast, and colon cancer. She agrees to begin OTC Vit D @2 ,000 IU daily and will follow up for routine testing of vitamin D, at least 2-3 times per year. She was informed of the risk of over-replacement of vitamin D and agrees to not increase her dose unless she discusses this with [place] first. Terri Mckee agrees to follow up as directed.  Obesity Terri Mckee is currently in the action stage of change. As such, her goal is to continue with weight loss efforts She has agreed to follow the Category 1 plan Terri Mckee has been instructed to work up to a goal of 150 minutes of combined cardio and strengthening exercise per week for weight loss and overall health benefits. We discussed the following Behavioral Modification Strategies today: increase H2O intake, no skipping meals, increasing lean protein intake, decreasing simple carbohydrates, increasing vegetables, decrease eating out and work on meal planning and easy cooking plans  Terri Mckee has agreed to follow up with our clinic in 2 weeks. She was informed of the importance of frequent follow up visits to maximize her success with intensive lifestyle modifications for her multiple health conditions.  ALLERGIES: Allergies  Allergen Reactions  . Allegra [Fexofenadine] Shortness Of Breath  . Erythromycin Nausea Only  . Oxycodone-Acetaminophen Nausea And Vomiting  . Propoxyphene N-Acetaminophen Nausea And Vomiting      severe vomiting  . Zocor [Simvastatin] Other (See  Comments)      Myalgia    MEDICATIONS: Current Outpatient Medications on File Prior to Visit  Medication Sig Dispense Refill  . acetaminophen-codeine (TYLENOL #3) 300-30 MG tablet Take 1-2 tablets by mouth every 4 (four) hours as needed for moderate pain or severe pain. 40 tablet 0  . albuterol (PROVENTIL HFA;VENTOLIN HFA) 108 (90 Base) MCG/ACT  inhaler Inhale 2 puffs into the lungs every 6 (six) hours as needed for wheezing or shortness of breath. 1 Inhaler 0  . amLODipine (NORVASC) 10 MG tablet Take 1 tablet (10 mg total) by mouth daily. 90 tablet 1  . aspirin 81 MG tablet Take 81 mg by mouth daily.      . budesonide-formoterol (SYMBICORT) 80-4.5 MCG/ACT inhaler INHALE 2 PUFFS INTO THE LUNGS 2 (TWO) TIMES DAILY. 10.2 g 2  . calcium carbonate (OS-CAL) 600 MG TABS Take 600 mg by mouth daily.      Marland Kitchen dicyclomine (BENTYL) 20 MG tablet Take 1 tablet (20 mg total) by mouth 3 (three) times daily as needed for spasms. 20 tablet 0  . doxycycline (VIBRA-TABS) 100 MG tablet Take 1 tablet (100 mg total) by mouth 2 (two) times daily. 15 tablet 0  . famotidine (PEPCID) 20 MG tablet TAKE 1 TABLET(20 MG) BY MOUTH TWICE DAILY 180 tablet 1  . Garlic 818 MG CAPS Take 1 capsule by mouth daily.    Terri Mckee Docker Oil 1000 MG CAPS Take 1,000 capsules by mouth daily.    Marland Kitchen levocetirizine (XYZAL) 5 MG tablet Take 1 tablet (5 mg total) by mouth every evening. 90 tablet 3  . Multiple Vitamin (MULTIVITAMIN) tablet Take 1 tablet by mouth daily.    Marland Kitchen omeprazole (PRILOSEC) 20 MG capsule Take 1 capsule (20 mg total) by mouth daily as needed. 30 capsule 5  . predniSONE (DELTASONE) 10 MG tablet 2 tabs a day x 5 days 10 tablet 0   No current facility-administered medications on file prior to visit.     PAST MEDICAL HISTORY: Past Medical History:  Diagnosis Date  . 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 Acute upper respiratory infection 04/09/2014  . Anemia   . Arthritis    back  . Back pain   . 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 Cataract   . Cataracts, bilateral 07/25/2015  . Cervical cancer screening 07/07/2012  . Colon polyps   . Elevated serum creatinine    with ACE inhibitor (Normal MRA of renal arteries)  . GERD (gastroesophageal reflux disease)   . History of cyst of breast   . Hoarseness of  voice 03/13/2012  . Hypertension   . Lactose intolerance   . Medicare annual wellness visit, subsequent 09/02/2014  . Muscle spasm of left lower extremity 03/21/2007   Qualifier: Diagnosis of  By: Wynona Luna   . Osteoarthritis   . Osteopenia 08/17/2014  . Preventative health care 04/05/2016  . Shortness of breath   . Skin lesion 07/10/2012  . Urinary incontinence 07/10/2012  . Vitamin D deficiency 04/03/2016    PAST SURGICAL HISTORY: Past Surgical History:  Procedure Laterality Date  . APPENDECTOMY    . BREAST CYST EXCISION Bilateral    right breast, twice  . OVARY SURGERY     right ovary & tube replacement  . WRIST GANGLION EXCISION     rt    SOCIAL HISTORY: Social History   Tobacco Use  . Smoking  status: Former Smoker    Last attempt to quit: 01/13/1991    Years since quitting: 27.2  . Smokeless tobacco: Never Used  . Tobacco comment: quit 20 years ago 1/2 ppd x 40 years  Substance Use Topics  . Alcohol use: No  . Drug use: No    FAMILY HISTORY: Family History  Problem Relation Age of Onset  . Brain cancer Mother        deceased age 58  . Alcohol abuse Mother   . Obesity Mother   . Other Brother        died of sepsis  . Gout Brother   . Alcohol abuse Father   . Cancer Sister        throat  . Pulmonary embolism Daughter   . Mental illness Daughter        anxiety  . Diabetes Maternal Aunt   . Diabetes Maternal Aunt   . Seizures Maternal Aunt   . Birth defects Maternal Aunt   . Lupus Grandchild   . Sickle cell anemia Grandchild   . Colon cancer Neg Hx   . Breast cancer Neg Hx     ROS: Review of Systems  Constitutional: Positive for weight loss.  Gastrointestinal: Negative for nausea and vomiting.  Musculoskeletal:       Negative for muscle weakness  Endo/Heme/Allergies:       Negative for polyphagia    PHYSICAL EXAM: Blood pressure 117/70, pulse (!) 57, temperature 97.7 F (36.5 C), temperature source Oral, height 5\' 9"  (1.753 m), weight 215 lb  (97.5 kg), SpO2 97 %. Body mass index is 31.75 kg/m. Physical Exam Vitals signs reviewed.  Constitutional:      Appearance: Normal appearance. She is well-developed. She is obese.  Cardiovascular:     Rate and Rhythm: Normal rate.  Pulmonary:     Effort: Pulmonary effort is normal.  Musculoskeletal: Normal range of motion.  Skin:    General: Skin is warm and dry.  Neurological:     Mental Status: She is alert and oriented to person, place, and time.  Psychiatric:        Mood and Affect: Mood normal.        Behavior: Behavior normal.     RECENT LABS AND TESTS: BMET    Component Value Date/Time   NA 138 02/26/2018 1332   K 3.8 02/26/2018 1332   CL 107 02/26/2018 1332   CO2 24 02/26/2018 1332   GLUCOSE 88 02/26/2018 1332   BUN 17 02/26/2018 1332   CREATININE 0.92 02/26/2018 1332   CREATININE 1.14 (H) 07/18/2012 1234   CALCIUM 8.5 (L) 02/26/2018 1332   GFRNONAA 60 (L) 02/26/2018 1332   GFRAA >60 02/26/2018 1332   Lab Results  Component Value Date   HGBA1C 5.4 03/15/2018   Lab Results  Component Value Date   INSULIN 22.7 03/15/2018   CBC    Component Value Date/Time   WBC 7.7 02/26/2018 1229   RBC 4.36 02/26/2018 1229   HGB 12.4 02/26/2018 1229   HCT 39.2 02/26/2018 1229   PLT 181 02/26/2018 1229   MCV 89.9 02/26/2018 1229   MCH 28.4 02/26/2018 1229   MCHC 31.6 02/26/2018 1229   RDW 14.6 02/26/2018 1229   LYMPHSABS 2.1 02/26/2018 1229   MONOABS 0.9 02/26/2018 1229   EOSABS 0.2 02/26/2018 1229   BASOSABS 0.0 02/26/2018 1229   Iron/TIBC/Ferritin/ %Sat No results found for: IRON, TIBC, FERRITIN, IRONPCTSAT Lipid Panel     Component Value Date/Time  CHOL 178 10/18/2017 1618   TRIG 140.0 10/18/2017 1618   HDL 41.50 10/18/2017 1618   CHOLHDL 4 10/18/2017 1618   VLDL 28.0 10/18/2017 1618   LDLCALC 109 (H) 10/18/2017 1618   Hepatic Function Panel     Component Value Date/Time   PROT 6.9 02/26/2018 1332   ALBUMIN 3.5 02/26/2018 1332   AST 13 (L)  02/26/2018 1332   ALT 12 02/26/2018 1332   ALKPHOS 52 02/26/2018 1332   BILITOT 0.7 02/26/2018 1332   BILIDIR 0.1 09/21/2013 1624   IBILI 0.2 03/08/2012 1337      Component Value Date/Time   TSH 2.09 10/18/2017 1618   TSH 3.43 06/14/2017 1109   TSH 2.62 10/05/2016 1029   Results for DONYE, DAUENHAUER (MRN [id number]) as of 03/29/2018 17:12  Ref. Range 03/15/2018 12:56  Vitamin D, 25-Hydroxy Latest Ref Range: 30.0 - 100.0 ng/mL 44.3     OBESITY BEHAVIORAL INTERVENTION VISIT  Today's visit was # 2   Starting weight: 219 lbs Starting date: 03/15/2018 Today's weight : 215 lbs  Today's date: 03/29/2018 Total lbs lost to date: 4 At least 15 minutes were spent on discussing the following behavioral intervention visit.    03/29/2018  Height 5\' 9"  (1.753 m)  Weight 215 lb (97.5 kg)  BMI (Calculated) 31.74  BLOOD PRESSURE - SYSTOLIC 384  BLOOD PRESSURE - DIASTOLIC 70   Body Fat % 46 %  Total Body Water (lbs) 75.6 lbs    ASK: We discussed the diagnosis of obesity with Levi Aland today and Adeline agreed to give [place] permission to discuss obesity behavioral modification therapy today.  ASSESS: Terri Mckee has the diagnosis of obesity and her BMI today is 31.74 Deavion is in the action stage of change   ADVISE: Teigan was educated on the multiple health risks of obesity as well as the benefit of weight loss to improve her health. She was advised of the need for long term treatment and the importance of lifestyle modifications to improve her current health and to decrease her risk of future health problems.  AGREE: Multiple dietary modification options and treatment options were discussed and  Ahni agreed to follow the recommendations documented in the above note.  ARRANGE: Tamatha was educated on the importance of frequent visits to treat obesity as outlined per CMS and USPSTF guidelines and agreed to schedule her next follow up appointment today.  Corey Skains, am  acting as Location manager for [employer]. Owens Shark, DO  I have reviewed the above documentation for accuracy and completeness, and I agree with the above. -Jearld Lesch, DO

## 2018-03-30 DIAGNOSIS — E8881 Metabolic syndrome: Secondary | ICD-10-CM | POA: Insufficient documentation

## 2018-04-14 ENCOUNTER — Ambulatory Visit (INDEPENDENT_AMBULATORY_CARE_PROVIDER_SITE_OTHER): Payer: Medicare Other | Admitting: Bariatrics

## 2018-04-19 ENCOUNTER — Ambulatory Visit: Payer: Medicare Other | Admitting: Family Medicine

## 2018-05-06 ENCOUNTER — Other Ambulatory Visit: Payer: Self-pay | Admitting: Family Medicine

## 2018-05-06 DIAGNOSIS — J441 Chronic obstructive pulmonary disease with (acute) exacerbation: Secondary | ICD-10-CM

## 2018-05-06 MED ORDER — SYMBICORT 80-4.5 MCG/ACT IN AERO: INHALATION_SPRAY | RESPIRATORY_TRACT | 2 refills | Status: DC

## 2018-05-06 NOTE — Telephone Encounter (Addendum)
Patient is requesting this medication in name brand

## 2018-06-14 ENCOUNTER — Other Ambulatory Visit: Payer: Self-pay | Admitting: Family Medicine

## 2018-06-16 ENCOUNTER — Telehealth: Payer: Self-pay | Admitting: Family Medicine

## 2018-06-20 MED ORDER — NORVASC 10 MG PO TABS: 10.0000 mg | ORAL_TABLET | Freq: Every day | ORAL | 1 refills | Status: DC

## 2018-06-20 MED ORDER — XYZAL 5 MG PO TABS: 5.0000 mg | ORAL_TABLET | Freq: Every evening | ORAL | 3 refills | Status: AC

## 2018-06-20 NOTE — Telephone Encounter (Signed)
Sent medications in.

## 2018-06-21 ENCOUNTER — Ambulatory Visit: Payer: Medicare Other | Admitting: Family Medicine

## 2018-06-23 ENCOUNTER — Telehealth: Payer: Self-pay

## 2018-06-23 NOTE — Telephone Encounter (Signed)
PA approved. Effective from 06/23/2018 through 06/23/2019

## 2018-06-23 NOTE — Telephone Encounter (Signed)
PA initiated via Covermymeds; KEY: [id number]. Awaiting determination.

## 2018-07-05 ENCOUNTER — Other Ambulatory Visit: Payer: Self-pay | Admitting: Internal Medicine

## 2018-07-05 DIAGNOSIS — Z20822 Contact with and (suspected) exposure to covid-19: Secondary | ICD-10-CM

## 2018-07-08 LAB — NOVEL CORONAVIRUS, NAA: SARS-CoV-2, NAA: NOT DETECTED

## 2018-08-09 ENCOUNTER — Other Ambulatory Visit (HOSPITAL_COMMUNITY)
Admission: RE | Admit: 2018-08-09 | Discharge: 2018-08-09 | Disposition: A | Discharge status: Home / Self Care | Payer: Medicare Other | Source: Ambulatory Visit | Attending: Family Medicine | Admitting: Family Medicine

## 2018-08-09 ENCOUNTER — Ambulatory Visit (INDEPENDENT_AMBULATORY_CARE_PROVIDER_SITE_OTHER): Payer: Medicare Other | Admitting: Family Medicine

## 2018-08-09 ENCOUNTER — Other Ambulatory Visit: Payer: Self-pay

## 2018-08-09 ENCOUNTER — Encounter: Payer: Self-pay | Admitting: Family Medicine

## 2018-08-09 VITALS — BP 124/82 | HR 67 | Temp 98.1°F | Resp 18 | Wt 216.4 lb

## 2018-08-09 DIAGNOSIS — I10 Essential (primary) hypertension: Secondary | ICD-10-CM | POA: Diagnosis not present

## 2018-08-09 DIAGNOSIS — Z124 Encounter for screening for malignant neoplasm of cervix: Secondary | ICD-10-CM | POA: Diagnosis present

## 2018-08-09 DIAGNOSIS — Z1151 Encounter for screening for human papillomavirus (HPV): Secondary | ICD-10-CM | POA: Diagnosis not present

## 2018-08-09 DIAGNOSIS — E782 Mixed hyperlipidemia: Secondary | ICD-10-CM | POA: Diagnosis not present

## 2018-08-09 DIAGNOSIS — R739 Hyperglycemia, unspecified: Secondary | ICD-10-CM

## 2018-08-09 DIAGNOSIS — E559 Vitamin D deficiency, unspecified: Secondary | ICD-10-CM

## 2018-08-09 DIAGNOSIS — Z780 Asymptomatic menopausal state: Secondary | ICD-10-CM | POA: Diagnosis not present

## 2018-08-09 DIAGNOSIS — M25552 Pain in left hip: Secondary | ICD-10-CM

## 2018-08-09 DIAGNOSIS — E8881 Metabolic syndrome: Secondary | ICD-10-CM

## 2018-08-09 LAB — CBC
HCT: 42 % (ref 36.0–46.0)
Hemoglobin: 13.9 g/dL (ref 12.0–15.0)
MCHC: 33.1 g/dL (ref 30.0–36.0)
MCV: 87.6 fl (ref 78.0–100.0)
Platelets: 212 10*3/uL (ref 150.0–400.0)
RBC: 4.79 Mil/uL (ref 3.87–5.11)
RDW: 14.6 % (ref 11.5–15.5)
WBC: 5.9 10*3/uL (ref 4.0–10.5)

## 2018-08-09 LAB — VITAMIN D 25 HYDROXY (VIT D DEFICIENCY, FRACTURES): VITD: 37.45 ng/mL (ref 30.00–100.00)

## 2018-08-09 LAB — LIPID PANEL
Cholesterol: 214 mg/dL — ABNORMAL HIGH (ref 0–200)
HDL: 43.7 mg/dL (ref >39.00)
LDL Cholesterol: 153 mg/dL — ABNORMAL HIGH (ref 0–99)
NonHDL: 169.99
Total CHOL/HDL Ratio: 5
Triglycerides: 86 mg/dL (ref 0.0–149.0)
VLDL: 17.2 mg/dL (ref 0.0–40.0)

## 2018-08-09 LAB — COMPREHENSIVE METABOLIC PANEL
ALT: 9 U/L (ref 0–35)
AST: 12 U/L (ref 0–37)
Albumin: 4.2 g/dL (ref 3.5–5.2)
Alkaline Phosphatase: 59 U/L (ref 39–117)
BUN: 18 mg/dL (ref 6–23)
CO2: 29 mEq/L (ref 19–32)
Calcium: 9.5 mg/dL (ref 8.4–10.5)
Chloride: 104 mEq/L (ref 96–112)
Creatinine, Ser: 1.24 mg/dL — ABNORMAL HIGH (ref 0.40–1.20)
GFR: 50.56 mL/min — ABNORMAL LOW (ref >60.00)
Glucose, Bld: 86 mg/dL (ref 70–99)
Potassium: 4.2 mEq/L (ref 3.5–5.1)
Sodium: 141 mEq/L (ref 135–145)
Total Bilirubin: 0.6 mg/dL (ref 0.2–1.2)
Total Protein: 7.3 g/dL (ref 6.0–8.3)

## 2018-08-09 LAB — HEMOGLOBIN A1C: Hgb A1c MFr Bld: 5.4 % (ref 4.6–6.5)

## 2018-08-09 LAB — TSH: TSH: 2.67 u[IU]/mL (ref 0.35–4.50)

## 2018-08-09 MED ORDER — FAMOTIDINE 20 MG PO TABS: ORAL_TABLET | ORAL | 1 refills | Status: DC

## 2018-08-09 MED ORDER — NORVASC 10 MG PO TABS: 10.0000 mg | ORAL_TABLET | Freq: Every day | ORAL | 3 refills | Status: DC

## 2018-08-09 NOTE — Patient Instructions (Signed)
Carbohydrate Counting for Adult  Carbohydrate counting is a method of keeping track of how many carbohydrates you eat. Eating carbohydrates naturally increases the amount of sugar (glucose) in the blood. Counting how many carbohydrates you eat helps keep your blood glucose within normal limits, which helps you manage your diabetes (diabetes mellitus). It is important to know how many carbohydrates you can safely have in each meal. This is different for every person. A diet and nutrition specialist (registered dietitian) can help you make a meal plan and calculate how many carbohydrates you should have at each meal and snack. Carbohydrates are found in the following foods:  Grains, such as breads and cereals.  Dried beans and soy products.  Starchy vegetables, such as potatoes, peas, and corn.  Fruit and fruit juices.  Milk and yogurt.  Sweets and snack foods, such as cake, cookies, candy, chips, and soft drinks. How do I count carbohydrates? There are two ways to count carbohydrates in food. You can use either of the methods or a combination of both. Reading "Nutrition Facts" on packaged food The "Nutrition Facts" list is included on the labels of almost all packaged foods and beverages in the [place] It includes:  The serving size.  Information about nutrients in each serving, including the grams (g) of carbohydrate per serving. To use the "Nutrition Facts":  Decide how many servings you will have.  Multiply the number of servings by the number of carbohydrates per serving.  The resulting number is the total amount of carbohydrates that you will be having. Learning standard serving sizes of other foods When you eat carbohydrate foods that are not packaged or do not include "Nutrition Facts" on the label, you need to measure the servings in order to count the amount of carbohydrates:  Measure the foods that you will eat with a food scale or measuring cup, if needed.  Decide how many  standard-size servings you will eat.  Multiply the number of servings by 15. Most carbohydrate-rich foods have about 15 g of carbohydrates per serving. ? For example, if you eat 8 oz (170 g) of strawberries, you will have eaten 2 servings and 30 g of carbohydrates (2 servings x 15 g = 30 g).  For foods that have more than one food mixed, such as soups and casseroles, you must count the carbohydrates in each food that is included. The following list contains standard serving sizes of common carbohydrate-rich foods. Each of these servings has about 15 g of carbohydrates:   hamburger bun or  English muffin.   oz (15 mL) syrup.   oz (14 g) jelly.  1 slice of bread.  1 six-inch tortilla.  3 oz (85 g) cooked rice or pasta.  4 oz (113 g) cooked dried beans.  4 oz (113 g) starchy vegetable, such as peas, corn, or potatoes.  4 oz (113 g) hot cereal.  4 oz (113 g) mashed potatoes or  of a large baked potato.  4 oz (113 g) canned or frozen fruit.  4 oz (120 mL) fruit juice.  4-6 crackers.  6 chicken nuggets.  6 oz (170 g) unsweetened dry cereal.  6 oz (170 g) plain fat-free yogurt or yogurt sweetened with artificial sweeteners.  8 oz (240 mL) milk.  8 oz (170 g) fresh fruit or one small piece of fruit.  24 oz (680 g) popped popcorn. Example of carbohydrate counting Sample meal  3 oz (85 g) chicken breast.  6 oz (170 g) brown rice.  4 oz (113 g) corn.  8 oz (240 mL) milk.  8 oz (170 g) strawberries with sugar-free whipped topping. Carbohydrate calculation 1. Identify the foods that contain carbohydrates: ? Rice. ? Corn. ? Milk. ? Strawberries. 2. Calculate how many servings you have of each food: ? 2 servings rice. ? 1 serving corn. ? 1 serving milk. ? 1 serving strawberries. 3. Multiply each number of servings by 15 g: ? 2 servings rice x 15 g = 30 g. ? 1 serving corn x 15 g = 15 g. ? 1 serving milk x 15 g = 15 g. ? 1 serving strawberries x 15 g = 15  g. 4. Add together all of the amounts to find the total grams of carbohydrates eaten: ? 30 g + 15 g + 15 g + 15 g = 75 g of carbohydrates total. Summary  Carbohydrate counting is a method of keeping track of how many carbohydrates you eat.  Eating carbohydrates naturally increases the amount of sugar (glucose) in the blood.  Counting how many carbohydrates you eat helps keep your blood glucose within normal limits, which helps you manage your diabetes.  A diet and nutrition specialist (registered dietitian) can help you make a meal plan and calculate how many carbohydrates you should have at each meal and snack. This information is not intended to replace advice given to you by your health care provider. Make sure you discuss any questions you have with your health care provider. Document Released: 12/29/2004 Document Revised: 07/23/2016 Document Reviewed: 06/12/2015 Elsevier Patient Education  2020 [employer].

## 2018-08-09 NOTE — Assessment & Plan Note (Signed)
Pap today, no concerns on exam.  

## 2018-08-09 NOTE — Assessment & Plan Note (Signed)
Encouraged heart healthy diet, increase exercise, avoid trans fats, consider a krill oil cap daily 

## 2018-08-09 NOTE — Assessment & Plan Note (Signed)
Well controlled, no changes to meds. Encouraged heart healthy diet such as the DASH diet and exercise as tolerated.  °

## 2018-08-09 NOTE — Assessment & Plan Note (Signed)
Monitor and supplement 

## 2018-08-10 NOTE — Assessment & Plan Note (Signed)
hgba1c acceptable, minimize simple carbs. Increase exercise as tolerated.  

## 2018-08-10 NOTE — Assessment & Plan Note (Signed)
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 Suspect some nerve damage. She will notify us if it worsens.

## 2018-08-10 NOTE — Progress Notes (Signed)
Subjective:    Patient ID: RAYNIE STEINHAUS, female    DOB: Sep 18, 1940, 78 y.o.   MRN: [id number]  No chief complaint on file.   HPI Patient is in today for follow up on chronic medical concerns including hypertension, hyperlipidemia and more. She is in need of a pap smear today. No gyn complaints noted. She is maintaining quarantine well. No recent febrile illness or hospitalizations. No polyuria or polydipsia. Denies CP/palp/SOB/HA/congestion/fevers/GI or GU c/o. Taking meds as prescribed  Past Medical History:  Diagnosis Date  . 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 Acute upper respiratory infection 04/09/2014  . Anemia   . Arthritis    back  . Back pain   . 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 Cataract   . Cataracts, bilateral 07/25/2015  . Cervical cancer screening 07/07/2012  . Colon polyps   . Elevated serum creatinine    with ACE inhibitor (Normal MRA of renal arteries)  . GERD (gastroesophageal reflux disease)   . History of cyst of breast   . Hoarseness of voice 03/13/2012  . Hypertension   . Lactose intolerance   . Medicare annual wellness visit, subsequent 09/02/2014  . Muscle spasm of left lower extremity 03/21/2007   Qualifier: Diagnosis of  By: Wynona Luna   . Osteoarthritis   . Osteopenia 08/17/2014  . Preventative health care 04/05/2016  . Shortness of breath   . Skin lesion 07/10/2012  . Urinary incontinence 07/10/2012  . Vitamin D deficiency 04/03/2016    Past Surgical History:  Procedure Laterality Date  . APPENDECTOMY    . BREAST CYST EXCISION Bilateral    right breast, twice  . OVARY SURGERY     right ovary & tube replacement  . WRIST GANGLION EXCISION     rt    Family History  Problem Relation Age of Onset  . Brain cancer Mother        deceased age 42  . Alcohol abuse Mother   . Obesity Mother   . Other Brother        died of sepsis  . Gout Brother   . Alcohol abuse Father    . Cancer Sister        throat  . Pulmonary embolism Daughter   . Mental illness Daughter        anxiety  . Diabetes Maternal Aunt   . Diabetes Maternal Aunt   . Seizures Maternal Aunt   . Birth defects Maternal Aunt   . Lupus Grandchild   . Sickle cell anemia Grandchild   . Colon cancer Neg Hx   . Breast cancer Neg Hx     Social History   Socioeconomic History  . Marital status: Married    Spouse name: Shanon Brow  . Number of children: Not on file  . Years of education: Not on file  . Highest education level: Not on file  Occupational History  . Occupation: retired  Scientific laboratory technician  . Financial resource strain: Not on file  . Food insecurity    Worry: Not on file    Inability: Not on file  . Transportation needs    Medical: Not on file    Non-medical: Not on file  Tobacco Use  . Smoking status: Former Smoker    Quit date: 01/13/1991    Years since quitting: 27.5  . Smokeless tobacco: Never Used  . Tobacco comment: quit  20 years ago 1/2 ppd x 40 years  Substance and Sexual Activity  . Alcohol use: No  . Drug use: No  . Sexual activity: Never    Comment: lives with husband, no dietary restrictions,   Lifestyle  . Physical activity    Days per week: Not on file    Minutes per session: Not on file  . Stress: Not on file  Relationships  . Social Herbalist on phone: Not on file    Gets together: Not on file    Attends religious service: Not on file    Active member of club or organization: Not on file    Attends meetings of clubs or organizations: Not on file    Relationship status: Not on file  . Intimate partner violence    Fear of current or ex partner: Not on file    Emotionally abused: Not on file    Physically abused: Not on file    Forced sexual activity: Not on file  Other Topics Concern  . Not on file  Social History Narrative   Former Smoker - quit 20 years ago (1/2 ppd x 40 years)   Widowed   1 Daughter         Outpatient Medications  Prior to Visit  Medication Sig Dispense Refill  . acetaminophen-codeine (TYLENOL #3) 300-30 MG tablet Take 1-2 tablets by mouth every 4 (four) hours as needed for moderate pain or severe pain. 40 tablet 0  . albuterol (PROVENTIL HFA;VENTOLIN HFA) 108 (90 Base) MCG/ACT inhaler Inhale 2 puffs into the lungs every 6 (six) hours as needed for wheezing or shortness of breath. 1 Inhaler 0  . aspirin 81 MG tablet Take 81 mg by mouth daily.      . calcium carbonate (OS-CAL) 600 MG TABS Take 600 mg by mouth daily.      Marland Kitchen dicyclomine (BENTYL) 20 MG tablet Take 1 tablet (20 mg total) by mouth 3 (three) times daily as needed for spasms. 20 tablet 0  . Garlic 154 MG CAPS Take 1 capsule by mouth daily.    Javier Docker Oil 1000 MG CAPS Take 1,000 capsules by mouth daily.    . Multiple Vitamin (MULTIVITAMIN) tablet Take 1 tablet by mouth daily.    Marland Kitchen omeprazole (PRILOSEC) 20 MG capsule Take 1 capsule (20 mg total) by mouth daily as needed. 30 capsule 5  . SYMBICORT 80-4.5 MCG/ACT inhaler INHALE 2 PUFFS INTO THE LUNGS 2 (TWO) TIMES DAILY. 10.2 g 2  . XYZAL 5 MG tablet Take 1 tablet (5 mg total) by mouth every evening. 90 tablet 3  . famotidine (PEPCID) 20 MG tablet TAKE 1 TABLET(20 MG) BY MOUTH TWICE DAILY 180 tablet 1  . NORVASC 10 MG tablet Take 1 tablet (10 mg total) by mouth daily. 90 tablet 1   No facility-administered medications prior to visit.     Allergies  Allergen Reactions  . Allegra [Fexofenadine] Shortness Of Breath  . Erythromycin Nausea Only  . Oxycodone-Acetaminophen Nausea And Vomiting  . Propoxyphene N-Acetaminophen Nausea And Vomiting      severe vomiting  . Zocor [Simvastatin] Other (See Comments)      Myalgia    Review of Systems  Constitutional: Negative for fever and malaise/fatigue.  HENT: Negative for congestion.   Eyes: Negative for blurred vision.  Respiratory: Negative for shortness of breath.   Cardiovascular: Negative for chest pain, palpitations and leg swelling.   Gastrointestinal: Negative for abdominal pain, blood in stool  and nausea.  Genitourinary: Negative for dysuria and frequency.  Musculoskeletal: Positive for joint pain. Negative for falls.  Skin: Negative for rash.  Neurological: Negative for dizziness, loss of consciousness and headaches.  Endo/Heme/Allergies: Negative for environmental allergies.  Psychiatric/Behavioral: Negative for depression. The patient is not nervous/anxious.        Objective:    Physical Exam Vitals signs and nursing note reviewed.  Constitutional:      General: She is not in acute distress.    Appearance: She is well-developed.  HENT:     Head: Normocephalic and atraumatic.     Nose: Nose normal.  Eyes:     General:        Right eye: No discharge.        Left eye: No discharge.  Neck:     Musculoskeletal: Normal range of motion and neck supple.  Cardiovascular:     Rate and Rhythm: Normal rate and regular rhythm.     Heart sounds: No murmur.  Pulmonary:     Effort: Pulmonary effort is normal.     Breath sounds: Normal breath sounds.  Abdominal:     General: Bowel sounds are normal.     Palpations: Abdomen is soft.     Tenderness: There is no abdominal tenderness.  Genitourinary:    General: Normal vulva.     Vagina: No vaginal discharge.     Rectum: Normal.  Musculoskeletal:        General: No tenderness.  Skin:    General: Skin is warm and dry.  Neurological:     Mental Status: She is alert and oriented to person, place, and time.     BP 124/82 (BP Location: Left Arm, Patient Position: Sitting, Cuff Size: Normal)   Pulse 67   Temp 98.1 F (36.7 C) (Oral)   Resp 18   Wt 216 lb 6.4 oz (98.2 kg)   LMP  (LMP Unknown)   SpO2 98%   BMI 31.96 kg/m  Wt Readings from Last 3 Encounters:  08/09/18 216 lb 6.4 oz (98.2 kg)  03/29/18 215 lb (97.5 kg)  03/15/18 219 lb (99.3 kg)    Diabetic Foot Exam - Simple   No data filed     Lab Results  Component Value Date   WBC 5.9 08/09/2018    HGB 13.9 08/09/2018   HCT 42.0 08/09/2018   PLT 212.0 08/09/2018   GLUCOSE 86 08/09/2018   CHOL 214 (H) 08/09/2018   TRIG 86.0 08/09/2018   HDL 43.70 08/09/2018   LDLCALC 153 (H) 08/09/2018   ALT 9 08/09/2018   AST 12 08/09/2018   NA 141 08/09/2018   K 4.2 08/09/2018   CL 104 08/09/2018   CREATININE 1.24 (H) 08/09/2018   BUN 18 08/09/2018   CO2 29 08/09/2018   TSH 2.67 08/09/2018   HGBA1C 5.4 08/09/2018    Lab Results  Component Value Date   TSH 2.67 08/09/2018   Lab Results  Component Value Date   WBC 5.9 08/09/2018   HGB 13.9 08/09/2018   HCT 42.0 08/09/2018   MCV 87.6 08/09/2018   PLT 212.0 08/09/2018   Lab Results  Component Value Date   NA 141 08/09/2018   K 4.2 08/09/2018   CO2 29 08/09/2018   GLUCOSE 86 08/09/2018   BUN 18 08/09/2018   CREATININE 1.24 (H) 08/09/2018   BILITOT 0.6 08/09/2018   ALKPHOS 59 08/09/2018   AST 12 08/09/2018   ALT 9 08/09/2018   PROT 7.3 08/09/2018  ALBUMIN 4.2 08/09/2018   CALCIUM 9.5 08/09/2018   ANIONGAP 7 02/26/2018   GFR 50.56 (L) 08/09/2018   Lab Results  Component Value Date   CHOL 214 (H) 08/09/2018   Lab Results  Component Value Date   HDL 43.70 08/09/2018   Lab Results  Component Value Date   LDLCALC 153 (H) 08/09/2018   Lab Results  Component Value Date   TRIG 86.0 08/09/2018   Lab Results  Component Value Date   CHOLHDL 5 08/09/2018   Lab Results  Component Value Date   HGBA1C 5.4 08/09/2018       Assessment & Plan:   Problem List Items Addressed This Visit    Hyperlipidemia, mixed    Encouraged heart healthy diet, increase exercise, avoid trans fats, consider a krill oil cap daily      Relevant Medications   NORVASC 10 MG tablet   Other Relevant Orders   VITAMIN D 25 Hydroxy (Vit-D Deficiency, Fractures) (Completed)   Lipid panel (Completed)   Essential hypertension    Well controlled, no changes to meds. Encouraged heart healthy diet such as the DASH diet and exercise as  tolerated.       Relevant Medications   NORVASC 10 MG tablet   Other Relevant Orders   CBC (Completed)   Comprehensive metabolic panel (Completed)   TSH (Completed)   Cervical cancer screening - Primary    Pap today, no concerns on exam.       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 Suspect some nerve damage. She will notify us if it worsens.       Vitamin D deficiency    Monitor and supplement      Insulin resistance    hgba1c acceptable, minimize simple carbs. Increase exercise as tolerated.        Other Visit Diagnoses    Hyperglycemia       Relevant Orders   Hemoglobin A1c (Completed)      I am having Selda M. Mattice maintain her aspirin, calcium carbonate, Garlic, multivitamin, Krill Oil, albuterol, omeprazole, acetaminophen-codeine, dicyclomine, Symbicort, Xyzal, Norvasc, and famotidine.  Meds ordered this encounter  Medications  . NORVASC 10 MG tablet    Sig: Take 1 tablet (10 mg total) by mouth daily.    Dispense:  90 tablet    Refill:  3    Insurance approved Brand name drug  . famotidine (PEPCID) 20 MG tablet    Sig: TAKE 1 TABLET(20 MG) BY MOUTH TWICE DAILY    Dispense:  180 tablet    Refill:  1    **Patient requests 90 days supply**     Penni Homans, MD

## 2018-08-11 LAB — CYTOLOGY - PAP
Diagnosis: NEGATIVE
HPV: NOT DETECTED

## 2018-10-05 ENCOUNTER — Telehealth: Payer: Self-pay

## 2018-10-05 NOTE — Telephone Encounter (Signed)
I need to know how she is taking things. Is she taking the famotidine 20 mg once or twice daily? Is she taking Omperazole 20 mg in am? We could increase her Famotidine to 40 mg at bed and Omeprazole 40 mg in am depending on what she is already taking. Also Avoid offending foods, start probiotics. Do not eat large meals in late evening and consider raising head of bed.

## 2018-10-05 NOTE — Telephone Encounter (Signed)
Copied from [place] [id number]. Topic: General - Other >> Oct 05, 2018  8:43 AM Terri Mckee wrote: Reason for CRM: pt is taking Famotidine and stated it isn't doing her any Mckee and she has been wheezing in her chest at night. Pt want to know if something else can be called in or does she need an appt, Pt use [facility]

## 2018-10-06 ENCOUNTER — Ambulatory Visit (INDEPENDENT_AMBULATORY_CARE_PROVIDER_SITE_OTHER): Payer: Medicare Other | Admitting: Family Medicine

## 2018-10-06 ENCOUNTER — Other Ambulatory Visit: Payer: Self-pay

## 2018-10-06 ENCOUNTER — Encounter: Payer: Self-pay | Admitting: Family Medicine

## 2018-10-06 DIAGNOSIS — J40 Bronchitis, not specified as acute or chronic: Secondary | ICD-10-CM | POA: Diagnosis not present

## 2018-10-06 MED ORDER — AZITHROMYCIN 250 MG PO TABS: ORAL_TABLET | ORAL | 0 refills | Status: DC

## 2018-10-06 MED ORDER — PROMETHAZINE-DM 6.25-15 MG/5ML PO SYRP: 5.0000 mL | ORAL_SOLUTION | Freq: Four times a day (QID) | ORAL | 0 refills | Status: DC | PRN

## 2018-10-06 NOTE — Progress Notes (Signed)
Virtual Visit via Telephone Note  I connected with Terri Mckee on 10/06/18 at  4:00 PM EDT by telephone and verified that I am speaking with the correct person using two identifiers.  Location: Patient: home  Provider: office    I discussed the limitations, risks, security and privacy concerns of performing an evaluation and management service by telephone and the availability of in person appointments. I also discussed with the patient that there may be a patient responsible charge related to this service. The patient expressed understanding and agreed to proceed.   History of Present Illness: Pt is home c/o cough and wheezing x few months. Mostly a problem at night   + prod cough.  No fever.  She is only using her symbicort at night and 1 puff No other symptoms-- not sob ,   Trouble only at night    Observations/Objective: Pt was unable to check vitals -- was only able to tell me she did not have a fever.  Pt was not sob on phone  Assessment and Plan: 1. Bronchitis Cough med and abx per orders Use symbicort--- 2 puffs bid and call office if no better mon If breathing gets worse over the weekend-- go to ER - azithromycin (ZITHROMAX Z-PAK) 250 MG tablet; As directed  Dispense: 6 each; Refill: 0 - promethazine-dextromethorphan (PROMETHAZINE-DM) 6.25-15 MG/5ML syrup; Take 5 mLs by mouth 4 (four) times daily as needed.  Dispense: 118 mL; Refill: 0 Pt states she has not been around a lot of other people and has not been exposed to covid--- she did not want to get tested-- if she is no better Monday-- she should get tested for covid  Follow Up Instructions:    I discussed the assessment and treatment plan with the patient. The patient was provided an opportunity to ask questions and all were answered. The patient agreed with the plan and demonstrated an understanding of the instructions.   The patient was advised to call back or seek an in-person evaluation if the symptoms worsen or  if the condition fails to improve as anticipated.  I provided 15 minutes of non-face-to-face time during this encounter.   Ann Held, DO

## 2018-10-07 NOTE — Telephone Encounter (Signed)
Called patient left message for patient to call the office back  NT can handle

## 2018-12-05 NOTE — Progress Notes (Signed)
Virtual Visit via Video Note  I connected with patient on 12/06/18 at  3:15 PM EST by audio enabled telemedicine application and verified that I am speaking with the correct person using two identifiers.   THIS ENCOUNTER IS A VIRTUAL VISIT DUE TO COVID-19 - PATIENT WAS NOT SEEN IN THE OFFICE. PATIENT HAS CONSENTED TO VIRTUAL VISIT / TELEMEDICINE VISIT   Location of patient: home  Location of provider: office  I discussed the limitations of evaluation and management by telemedicine and the availability of in person appointments. The patient expressed understanding and agreed to proceed.    Subjective:   Terri Mckee is a 78 y.o. female who presents for an Initial Medicare Annual Wellness Visit.  The Patient was informed that the wellness visit is to identify future health risk and educate and initiate measures that can reduce risk for increased disease through the lifespan.   Describes health as fair, good or great? 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  Review of Systems   Cardiac Risk Factors include: advanced age (>82men, >32 women);dyslipidemia;hypertension;obesity (BMI >30kg/m2) Home Safety/Smoke Alarms: Feels safe in home. Smoke alarms in place.  Lives w/ husband in 1 story home.   Female:   Mammo- pt states she will schedule      Dexa scan- 10/21/17       CCS- last reported 10/06/11.    Objective:     Advanced Directives 12/06/2018 02/26/2018 05/24/2015 08/17/2014 02/13/2014  Does Patient Have a Medical Advance Directive? No No No No No  Would patient like information on creating a medical advance directive? No - Patient declined - No - patient declined information No - patient declined information No - patient declined information    Current Medications (verified) Outpatient Encounter Medications as of 12/06/2018  Medication Sig  . acetaminophen-codeine (TYLENOL #3) 300-30 MG tablet Take 1-2 tablets by mouth every 4 (four) hours as needed for moderate pain  or severe pain.  Marland Kitchen albuterol (PROVENTIL HFA;VENTOLIN HFA) 108 (90 Base) MCG/ACT inhaler Inhale 2 puffs into the lungs every 6 (six) hours as needed for wheezing or shortness of breath.  Marland Kitchen aspirin 81 MG tablet Take 81 mg by mouth daily.    . calcium carbonate (OS-CAL) 600 MG TABS Take 600 mg by mouth daily.    . famotidine (PEPCID) 20 MG tablet TAKE 1 TABLET(20 MG) BY MOUTH TWICE DAILY  . Garlic [id number] MG CAPS Take 1 capsule by mouth daily.  Javier Docker Oil 1000 MG CAPS Take 1,000 capsules by mouth daily.  . Multiple Vitamin (MULTIVITAMIN) tablet Take 1 tablet by mouth daily.  . NORVASC 10 MG tablet Take 1 tablet (10 mg total) by mouth daily.  . SYMBICORT 80-4.5 MCG/ACT inhaler INHALE 2 PUFFS INTO THE LUNGS 2 (TWO) TIMES DAILY.  Marland Kitchen XYZAL 5 MG tablet Take 1 tablet (5 mg total) by mouth every evening.  . dicyclomine (BENTYL) 20 MG tablet Take 1 tablet (20 mg total) by mouth 3 (three) times daily as needed for spasms. (Patient not taking: Reported on 12/06/2018)  . omeprazole (PRILOSEC) 20 MG capsule Take 1 capsule (20 mg total) by mouth daily as needed. (Patient not taking: Reported on 12/06/2018)  . promethazine-dextromethorphan (PROMETHAZINE-DM) 6.25-15 MG/5ML syrup Take 5 mLs by mouth 4 (four) times daily as needed. (Patient not taking: Reported on 12/06/2018)  . [DISCONTINUED] azithromycin (ZITHROMAX Z-PAK) 250 MG tablet As directed   No facility-administered encounter medications on file as of 12/06/2018.     Allergies (verified)  Allegra [fexofenadine], Erythromycin, Oxycodone-acetaminophen, Propoxyphene n-acetaminophen, and Zocor [simvastatin]   History: Past Medical History:  Diagnosis Date  . 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 Acute upper respiratory infection 04/09/2014  . Anemia   . Arthritis    back  . Back pain   . 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 Cataract   . Cataracts, bilateral 07/25/2015  . Cervical cancer  screening 07/07/2012  . Colon polyps   . Elevated serum creatinine    with ACE inhibitor (Normal MRA of renal arteries)  . GERD (gastroesophageal reflux disease)   . History of cyst of breast   . Hoarseness of voice 03/13/2012  . Hypertension   . Lactose intolerance   . Medicare annual wellness visit, subsequent 09/02/2014  . Muscle spasm of left lower extremity 03/21/2007   Qualifier: Diagnosis of  By: Wynona Luna   . Osteoarthritis   . Osteopenia 08/17/2014  . Preventative health care 04/05/2016  . Shortness of breath   . Skin lesion 07/10/2012  . Urinary incontinence 07/10/2012  . Vitamin D deficiency 04/03/2016   Past Surgical History:  Procedure Laterality Date  . APPENDECTOMY    . BREAST CYST EXCISION Bilateral    right breast, twice  . OVARY SURGERY     right ovary & tube replacement  . WRIST GANGLION EXCISION     rt   Family History  Problem Relation Age of Onset  . Brain cancer Mother        deceased age 58  . Alcohol abuse Mother   . Obesity Mother   . Other Brother        died of sepsis  . Gout Brother   . Alcohol abuse Father   . Cancer Sister        throat  . Pulmonary embolism Daughter   . Mental illness Daughter        anxiety  . Diabetes Maternal Aunt   . Diabetes Maternal Aunt   . Seizures Maternal Aunt   . Birth defects Maternal Aunt   . Lupus Grandchild   . Sickle cell anemia Grandchild   . Colon cancer Neg Hx   . Breast cancer Neg Hx    Social History   Socioeconomic History  . Marital status: Married    Spouse name: Shanon Brow  . Number of children: Not on file  . Years of education: Not on file  . Highest education level: Not on file  Occupational History  . Occupation: retired  Scientific laboratory technician  . Financial resource strain: Not on file  . Food insecurity    Worry: Not on file    Inability: Not on file  . Transportation needs    Medical: Not on file    Non-medical: Not on file  Tobacco Use  . Smoking status: Former Smoker    Quit date:  01/13/1991    Years since quitting: 27.9  . Smokeless tobacco: Never Used  . Tobacco comment: quit 20 years ago 1/2 ppd x 40 years  Substance and Sexual Activity  . Alcohol use: No  . Drug use: No  . Sexual activity: Never    Comment: lives with husband, no dietary restrictions,   Lifestyle  . Physical activity    Days per week: Not on file    Minutes per session: Not on file  . Stress: Not on file  Relationships  . Social connections  Talks on phone: Not on file    Gets together: Not on file    Attends religious service: Not on file    Active member of club or organization: Not on file    Attends meetings of clubs or organizations: Not on file    Relationship status: Not on file  Other Topics Concern  . Not on file  Social History Narrative   Former Smoker - quit 20 years ago (1/2 ppd x 40 years)   Widowed   1 Daughter         Tobacco Counseling Counseling given: Not Answered Comment: quit 20 years ago 1/2 ppd x 40 years   Clinical Intake: Pain : No/denies pain    Activities of Daily Living In your present state of health, do you have any difficulty performing the following activities: 12/06/2018  Hearing? N  Vision? N  Difficulty concentrating or making decisions? N  Walking or climbing stairs? N  Dressing or bathing? N  Doing errands, shopping? N  Preparing Food and eating ? N  Using the Toilet? N  In the past six months, have you accidently leaked urine? N  Do you have problems with loss of bowel control? N  Managing your Medications? N  Managing your Finances? N  Housekeeping or managing your Housekeeping? N  Some recent data might be hidden     Immunizations and Health Maintenance Immunization History  Administered Date(s) Administered  . Pneumococcal Conjugate-13 09/21/2013  . Pneumococcal Polysaccharide-23 10/31/2010, 10/05/2016  . Td 07/25/2015   Health Maintenance Due  Topic Date Due  . COLONOSCOPY  10/05/2016  . INFLUENZA VACCINE   08/13/2018    Patient Care Team: [name], MD as PCP - General (Family Medicine) Berle Mull, MD as Consulting Physician (Family Medicine) Irene Shipper, MD as Consulting Physician (Gastroenterology) Bjorn Loser, MD as Consulting Physician (Urology) Druscilla Brownie, MD as Consulting Physician (Dermatology)  Indicate any recent Medical Services you may have received from other than [facility] providers in the past year (date may be approximate).     Assessment:   This is a routine wellness examination for Lynnanne. Physical assessment deferred to PCP.  Hearing/Vision screen Unable to assess. This visit is enabled though telemedicine due to Covid 19.   Dietary issues and exercise activities discussed: Current Exercise Habits: The patient does not participate in regular exercise at present, Exercise limited by: None identified Diet (meal preparation, eat out, water intake, caffeinated beverages, dairy products, fruits and vegetables): 24 hr recall Breakfast: Sausage, egg,grits Lunch: cheeseburger Dinner:    Meat, collards, corn  Goals    . Increase physical activity      Depression Screen PHQ 2/9 Scores 12/06/2018 03/15/2018 10/18/2017 04/03/2016 07/25/2015 03/29/2014  PHQ - 2 Score 0 1 0 0 0 0  PHQ- 9 Score - 4 - - - -  Exception Documentation - Medical reason - - - -    Fall Risk Fall Risk  12/06/2018 10/18/2017 04/03/2016 07/25/2015 03/29/2014  Falls in the past year? 0 No No No No  Number falls in past yr: 0 - - - -  Injury with Fall? 0 - - - -  Follow up Education provided;Falls prevention discussed - - - -    Cognitive Function: Ad8 score reviewed for issues:  Issues making decisions:no  Less interest in hobbies / activities:no  Repeats questions, stories (family complaining):no  Trouble using ordinary gadgets (microwave, computer, phone):no  Forgets the month or year: no  Mismanaging finances: no  Remembering appts:no  Daily problems with thinking  and/or memory:no Ad8 score is=0          Screening Tests Health Maintenance  Topic Date Due  . COLONOSCOPY  10/05/2016  . INFLUENZA VACCINE  08/13/2018  . TETANUS/TDAP  07/24/2025  . DEXA SCAN  Completed  . PNA vac Low Risk Adult  Completed       Plan:   See you next year!  Continue to eat heart healthy diet (full of fruits, vegetables, whole grains, lean protein, water--limit salt, fat, and sugar intake) and increase physical activity as tolerated.  Continue doing brain stimulating activities (puzzles, reading, adult coloring books, staying active) to keep memory sharp.     I have personally reviewed and noted the following in the patient's chart:   . Medical and social history . Use of alcohol, tobacco or illicit drugs  . Current medications and supplements . Functional ability and status . Nutritional status . Physical activity . Advanced directives . List of other physicians . Hospitalizations, surgeries, and ER visits in previous 12 months . Vitals . Screenings to include cognitive, depression, and falls . Referrals and appointments  In addition, I have reviewed and discussed with patient certain preventive protocols, quality metrics, and best practice recommendations. A written personalized care plan for preventive services as well as general preventive health recommendations were provided to patient.     Shela Nevin, [place]   12/06/2018

## 2018-12-06 ENCOUNTER — Encounter: Payer: Self-pay | Admitting: *Deleted

## 2018-12-06 ENCOUNTER — Ambulatory Visit (INDEPENDENT_AMBULATORY_CARE_PROVIDER_SITE_OTHER): Payer: Medicare Other | Admitting: *Deleted

## 2018-12-06 ENCOUNTER — Other Ambulatory Visit: Payer: Self-pay | Admitting: *Deleted

## 2018-12-06 ENCOUNTER — Other Ambulatory Visit: Payer: Self-pay

## 2018-12-06 DIAGNOSIS — Z0000 Encounter for general adult medical examination without abnormal findings: Secondary | ICD-10-CM

## 2018-12-06 DIAGNOSIS — J441 Chronic obstructive pulmonary disease with (acute) exacerbation: Secondary | ICD-10-CM

## 2018-12-06 MED ORDER — ALBUTEROL SULFATE HFA 108 (90 BASE) MCG/ACT IN AERS: 2.0000 | INHALATION_SPRAY | Freq: Four times a day (QID) | RESPIRATORY_TRACT | 1 refills | Status: DC | PRN

## 2018-12-06 MED ORDER — ACETAMINOPHEN-CODEINE #3 300-30 MG PO TABS: 1.0000 | ORAL_TABLET | ORAL | 0 refills | Status: DC | PRN

## 2018-12-06 NOTE — Telephone Encounter (Signed)
Virtual AWV done today. Pt requesting refills.

## 2018-12-06 NOTE — Patient Instructions (Signed)
See you next year!  Continue to eat heart healthy diet (full of fruits, vegetables, whole grains, lean protein, water--limit salt, fat, and sugar intake) and increase physical activity as tolerated.  Continue doing brain stimulating activities (puzzles, reading, adult coloring books, staying active) to keep memory sharp.    Terri Mckee , Thank you for taking time to come for your Medicare Wellness Visit. I appreciate your ongoing commitment to your health goals. Please review the following plan we discussed and let me know if I can assist you in the future.   These are the goals we discussed: Goals    . Increase physical activity       This is a list of the screening recommended for you and due dates:  Health Maintenance  Topic Date Due  . Colon Cancer Screening  10/05/2016  . Flu Shot  08/13/2018  . Tetanus Vaccine  07/24/2025  . DEXA scan (bone density measurement)  Completed  . Pneumonia vaccines  Completed    Preventive Care 78 Years and Older, Female Preventive care refers to lifestyle choices and visits with your health care provider that can promote health and wellness. This includes:  A yearly physical exam. This is also called an annual well check.  Regular dental and eye exams.  Immunizations.  Screening for certain conditions.  Healthy lifestyle choices, such as diet and exercise. What can I expect for my preventive care visit? Physical exam Your health care provider will check:  Height and weight. These may be used to calculate body mass index (BMI), which is a measurement that tells if you are at a healthy weight.  Heart rate and blood pressure.  Your skin for abnormal spots. Counseling Your health care provider may ask you questions about:  Alcohol, tobacco, and drug use.  Emotional well-being.  Home and relationship well-being.  Sexual activity.  Eating habits.  History of falls.  Memory and ability to understand (cognition).  Work and  work Statistician.  Pregnancy and menstrual history. What immunizations do I need?  Influenza (flu) vaccine  This is recommended every year. Tetanus, diphtheria, and pertussis (Tdap) vaccine  You may need a Td booster every 10 years. Varicella (chickenpox) vaccine  You may need this vaccine if you have not already been vaccinated. Zoster (shingles) vaccine  You may need this after age 45. Pneumococcal conjugate (PCV13) vaccine  One dose is recommended after age 25. Pneumococcal polysaccharide (PPSV23) vaccine  One dose is recommended after age 7. Measles, mumps, and rubella (MMR) vaccine  You may need at least one dose of MMR if you were born in 1957 or later. You may also need a second dose. Meningococcal conjugate (MenACWY) vaccine  You may need this if you have certain conditions. Hepatitis A vaccine  You may need this if you have certain conditions or if you travel or work in places where you may be exposed to hepatitis A. Hepatitis B vaccine  You may need this if you have certain conditions or if you travel or work in places where you may be exposed to hepatitis B. Haemophilus influenzae type b (Hib) vaccine  You may need this if you have certain conditions. You may receive vaccines as individual doses or as more than one vaccine together in one shot (combination vaccines). Talk with your health care provider about the risks and benefits of combination vaccines. What tests do I need? Blood tests  Lipid and cholesterol levels. These may be checked every 5 years, or more frequently  depending on your overall health.  Hepatitis C test.  Hepatitis B test. Screening  Lung cancer screening. You may have this screening every year starting at age 22 if you have a 30-pack-year history of smoking and currently smoke or have quit within the past 15 years.  Colorectal cancer screening. All adults should have this screening starting at age 28 and continuing until age 36.  Your health care provider may recommend screening at age 21 if you are at increased risk. You will have tests every 1-10 years, depending on your results and the type of screening test.  Diabetes screening. This is done by checking your blood sugar (glucose) after you have not eaten for a while (fasting). You may have this done every 1-3 years.  Mammogram. This may be done every 1-2 years. Talk with your health care provider about how often you should have regular mammograms.  BRCA-related cancer screening. This may be done if you have a family history of breast, ovarian, tubal, or peritoneal cancers. Other tests  Sexually transmitted disease (STD) testing.  Bone density scan. This is done to screen for osteoporosis. You may have this done starting at age 92. Follow these instructions at home: Eating and drinking  Eat a diet that includes fresh fruits and vegetables, whole grains, lean protein, and low-fat dairy products. Limit your intake of foods with high amounts of sugar, saturated fats, and salt.  Take vitamin and mineral supplements as recommended by your health care provider.  Do not drink alcohol if your health care provider tells you not to drink.  If you drink alcohol: ? Limit how much you have to 0-1 drink a day. ? Be aware of how much alcohol is in your drink. In the [place] one drink equals one 12 oz bottle of beer (355 mL), one 5 oz glass of wine (148 mL), or one 1 oz glass of hard liquor (44 mL). Lifestyle  Take daily care of your teeth and gums.  Stay active. Exercise for at least 30 minutes on 5 or more days each week.  Do not use any products that contain nicotine or tobacco, such as cigarettes, e-cigarettes, and chewing tobacco. If you need help quitting, ask your health care provider.  If you are sexually active, practice safe sex. Use a condom or other form of protection in order to prevent STIs (sexually transmitted infections).  Talk with your health care  provider about taking a low-dose aspirin or statin. What's next?  Go to your health care provider once a year for a well check visit.  Ask your health care provider how often you should have your eyes and teeth checked.  Stay up to date on all vaccines. This information is not intended to replace advice given to you by your health care provider. Make sure you discuss any questions you have with your health care provider. Document Released: 01/25/2015 Document Revised: 12/23/2017 Document Reviewed: 12/23/2017 Elsevier Patient Education  2020 [employer].

## 2018-12-11 IMAGING — MR MR HIP*R* W/O CM
4 of 6 series · 11 of 40 positions shown · non-contrast
Comparison: Radiograph 06/14/2017

CLINICAL DATA: Right hip pain for 6 months. Painful range of
motion.

EXAM:
MR OF THE RIGHT HIP WITHOUT CONTRAST
TECHNIQUE: Multiplanar, multisequence MR imaging was performed. No intravenous
contrast was administered.

[Series 2: T1 · coronal · 4.0mm · 0.47mm/px · 3 of 25 slices shown (1 of 2)]
[im 5/25]
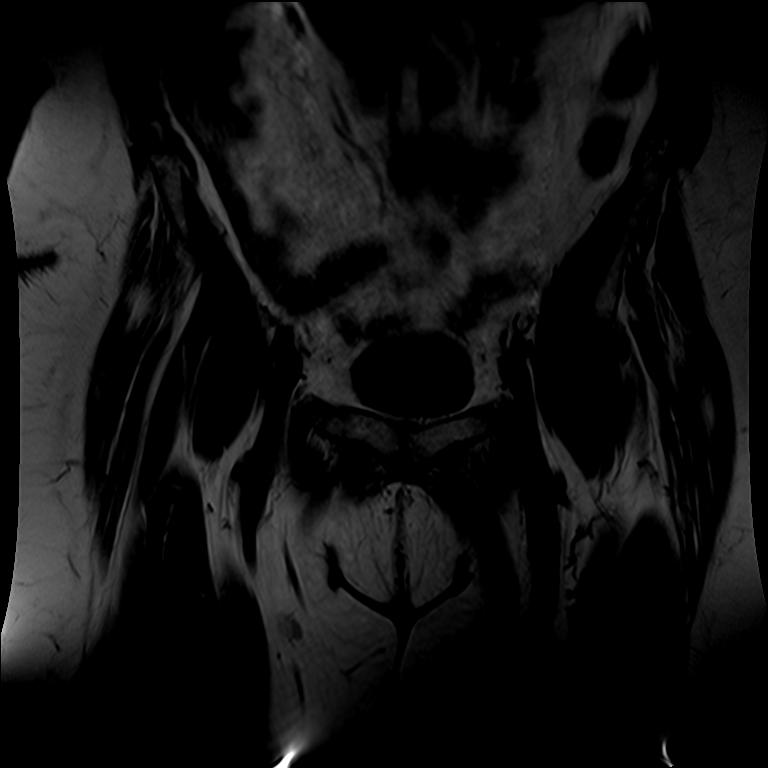
[im 15/25]
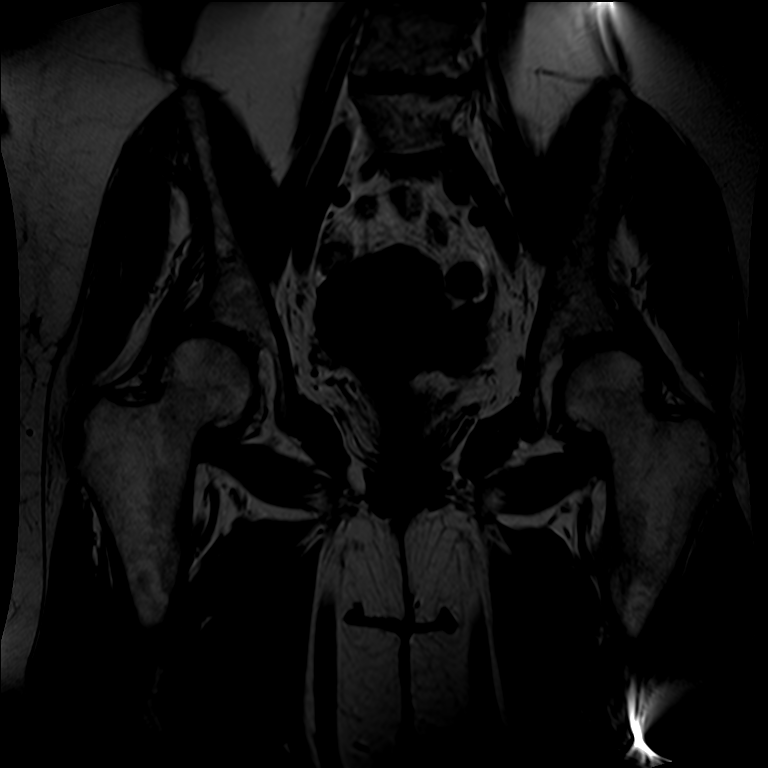
[im 25/25]
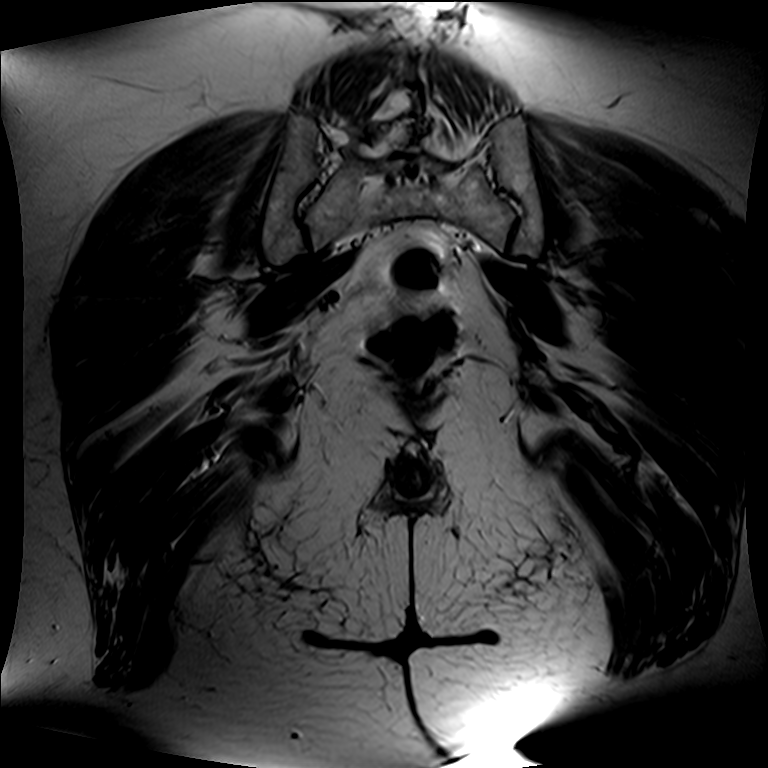

[Series 4: T1 · axial · 4.0mm · 0.47mm/px · z∈[-84,-24]mm · 2 of 30 slices shown (2 of 2)]
[im 5/30]
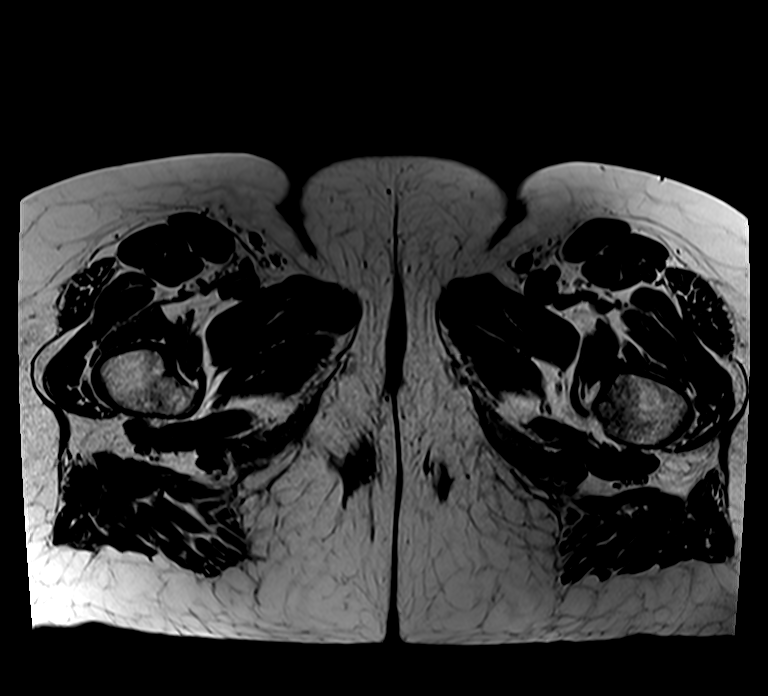
[im 17/30]
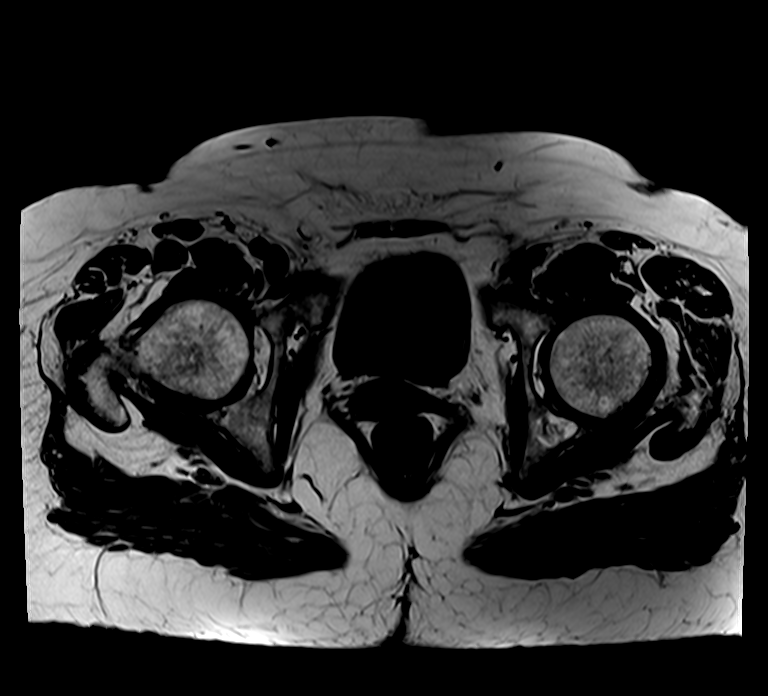

[Series 5: T2 fat-sat · axial · 4.0mm · 0.47mm/px · z∈[-84,+16]mm · 3 of 30 slices shown]
[im 5/30]
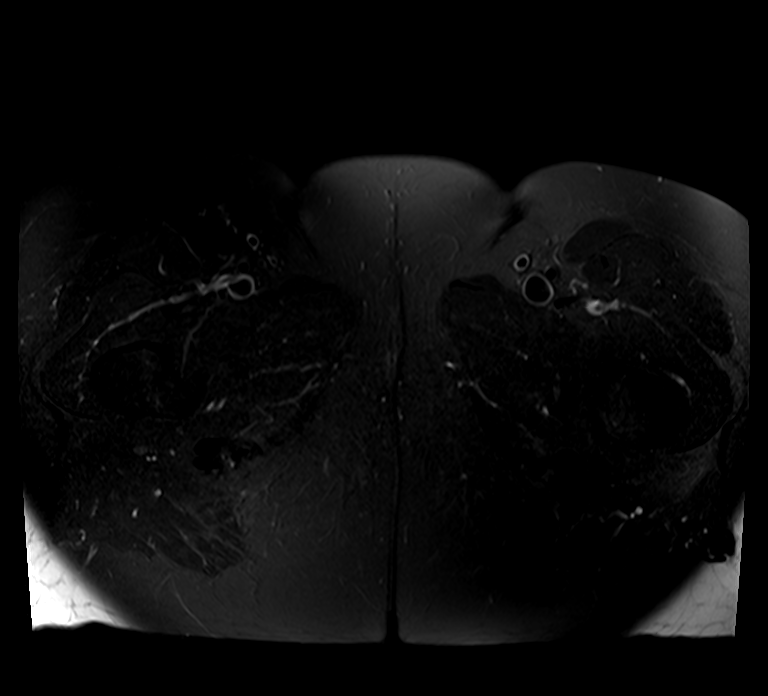
[im 17/30]
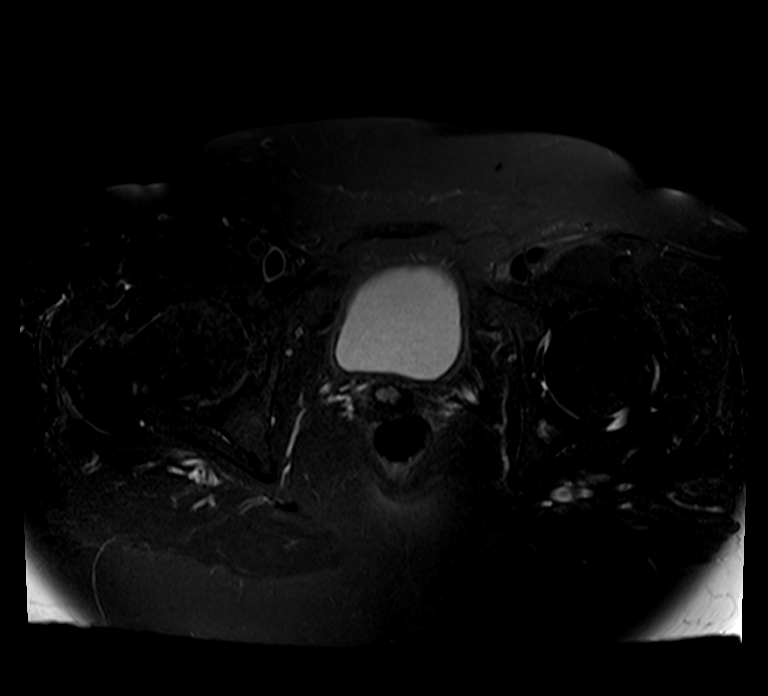
[im 25/30]
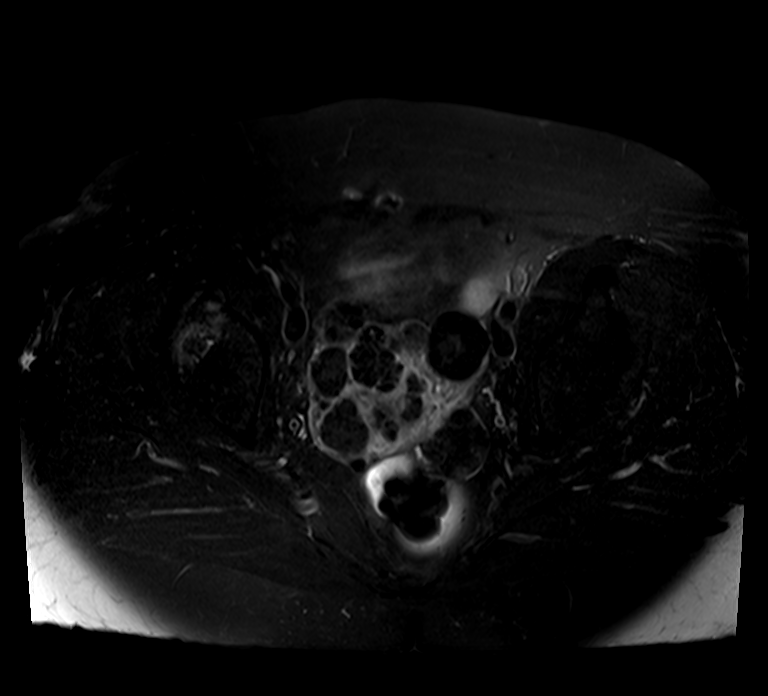

[Series 6: PD fat-sat · sagittal · 4.0mm · 0.35mm/px · 3 of 24 slices shown]
[im 4/24]
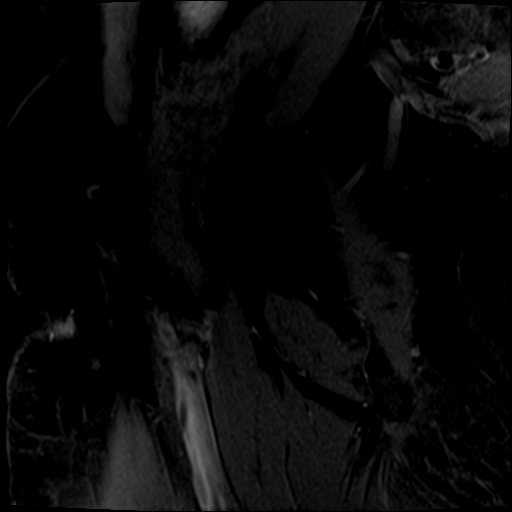
[im 12/24]
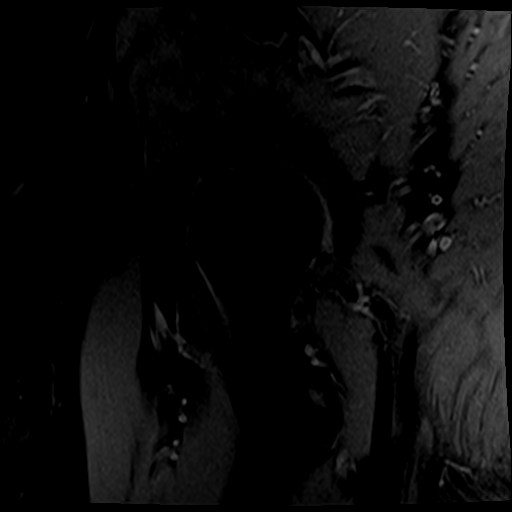
[im 20/24]
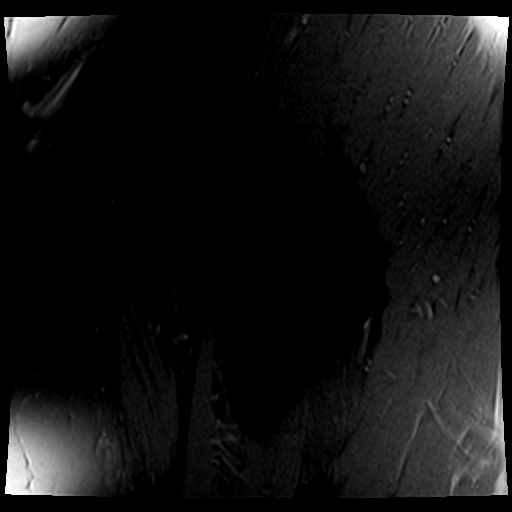

[11 of 40 positions shown; findings below may reference images not displayed]

FINDINGS: Both hips are normally located. Moderate to advanced right hip joint
degenerative changes and moderate degenerative changes on the left.
No stress fracture or AVN. Moderate subchondral cystic changes
involving the acetabulum and and to a lesser extent the humeral
head. No hip joint effusion. No periarticular fluid collections to
suggest a paralabral cyst.

The pubic symphysis and SI joints are intact. Mild degenerative
changes. No pelvic fracture or bone lesion.

The surrounding hip and pelvic musculature are intact. No muscle
tear, myositis or mass. There is mild bilateral Peri tendinosis but
no trochanteric bursitis.

No significant intrapelvic abnormalities. Numerous uterine fibroids
are noted. No pelvic mass or adenopathy. No inguinal mass or hernia.
The hamstring tendons are intact.
IMPRESSION: 1. Bilateral hip joint degenerative changes, right greater than left
but no findings for stress fracture or AVN.
2. Intact bony pelvis.
3. Intact surrounding hip and pelvic musculature. Mild bilateral
peritendinosis.
4. Numerous uterine fibroids are noted.

## 2019-02-09 ENCOUNTER — Ambulatory Visit (HOSPITAL_BASED_OUTPATIENT_CLINIC_OR_DEPARTMENT_OTHER)
Admission: RE | Admit: 2019-02-09 | Discharge: 2019-02-09 | Disposition: A | Discharge status: Home / Self Care | Payer: Medicare Other | Source: Ambulatory Visit | Attending: Family Medicine | Admitting: Family Medicine

## 2019-02-09 ENCOUNTER — Ambulatory Visit: Payer: Medicare Other | Admitting: Family Medicine

## 2019-02-09 ENCOUNTER — Other Ambulatory Visit: Payer: Self-pay

## 2019-02-09 VITALS — BP 126/80 | HR 73 | Temp 97.6°F

## 2019-02-09 DIAGNOSIS — E782 Mixed hyperlipidemia: Secondary | ICD-10-CM

## 2019-02-09 DIAGNOSIS — M25562 Pain in left knee: Secondary | ICD-10-CM | POA: Diagnosis present

## 2019-02-09 DIAGNOSIS — E559 Vitamin D deficiency, unspecified: Secondary | ICD-10-CM

## 2019-02-09 DIAGNOSIS — M25561 Pain in right knee: Secondary | ICD-10-CM | POA: Insufficient documentation

## 2019-02-09 DIAGNOSIS — K219 Gastro-esophageal reflux disease without esophagitis: Secondary | ICD-10-CM

## 2019-02-09 DIAGNOSIS — I10 Essential (primary) hypertension: Secondary | ICD-10-CM

## 2019-02-09 DIAGNOSIS — M8580 Other specified disorders of bone density and structure, unspecified site: Secondary | ICD-10-CM

## 2019-02-09 DIAGNOSIS — E8881 Metabolic syndrome: Secondary | ICD-10-CM

## 2019-02-09 LAB — LIPID PANEL
Cholesterol: 185 mg/dL (ref 0–200)
HDL: 43 mg/dL (ref >39.00)
LDL Cholesterol: 129 mg/dL — ABNORMAL HIGH (ref 0–99)
NonHDL: 142.28
Total CHOL/HDL Ratio: 4
Triglycerides: 66 mg/dL (ref 0.0–149.0)
VLDL: 13.2 mg/dL (ref 0.0–40.0)

## 2019-02-09 LAB — COMPREHENSIVE METABOLIC PANEL
ALT: 11 U/L (ref 0–35)
AST: 12 U/L (ref 0–37)
Albumin: 3.9 g/dL (ref 3.5–5.2)
Alkaline Phosphatase: 51 U/L (ref 39–117)
BUN: 21 mg/dL (ref 6–23)
CO2: 28 mEq/L (ref 19–32)
Calcium: 9.1 mg/dL (ref 8.4–10.5)
Chloride: 105 mEq/L (ref 96–112)
Creatinine, Ser: 1.18 mg/dL (ref 0.40–1.20)
GFR: 53.46 mL/min — ABNORMAL LOW (ref >60.00)
Glucose, Bld: 76 mg/dL (ref 70–99)
Potassium: 4.3 mEq/L (ref 3.5–5.1)
Sodium: 141 mEq/L (ref 135–145)
Total Bilirubin: 0.4 mg/dL (ref 0.2–1.2)
Total Protein: 6.7 g/dL (ref 6.0–8.3)

## 2019-02-09 LAB — CBC
HCT: 41.3 % (ref 36.0–46.0)
Hemoglobin: 13.5 g/dL (ref 12.0–15.0)
MCHC: 32.6 g/dL (ref 30.0–36.0)
MCV: 87.9 fl (ref 78.0–100.0)
Platelets: 203 10*3/uL (ref 150.0–400.0)
RBC: 4.7 Mil/uL (ref 3.87–5.11)
RDW: 14.5 % (ref 11.5–15.5)
WBC: 5.2 10*3/uL (ref 4.0–10.5)

## 2019-02-09 LAB — TSH: TSH: 2.96 u[IU]/mL (ref 0.35–4.50)

## 2019-02-09 LAB — HEMOGLOBIN A1C: Hgb A1c MFr Bld: 5.4 % (ref 4.6–6.5)

## 2019-02-09 LAB — VITAMIN D 25 HYDROXY (VIT D DEFICIENCY, FRACTURES): VITD: 36.28 ng/mL (ref 30.00–100.00)

## 2019-02-09 MED ORDER — FAMOTIDINE 20 MG PO TABS: ORAL_TABLET | ORAL | 1 refills | Status: DC

## 2019-02-09 NOTE — Assessment & Plan Note (Signed)
She notes the majority of the pain and some weakness is noted over there lateral aspects of both knees. Has been worsening over several months and she is worried about staying activity. XRays confirm mild to moderate arthritis will refer for physical therapy and consider ortho if no improvement

## 2019-02-09 NOTE — Assessment & Plan Note (Signed)
Supplement and monitor 

## 2019-02-09 NOTE — Progress Notes (Signed)
Subjective:    Patient ID: Terri Mckee, female    DOB: January 31, 1940, 79 y.o.   MRN: [id number]  Chief Complaint  Patient presents with  . Follow-up    HPI Patient is in today for follow up on chronic medical concerns. She has had her first COVID vaccine from [employer] and tolerated it well she has her 2nd shot scheduled for February 5. She denies any recent febrile illness or hospitalizations. She is maintaining quarantine with her husband well. No polyuria or polydipsia. Denies CP/palp/SOB/HA/congestion/fevers/GI or GU c/o. Taking meds as prescribed  Past Medical History:  Diagnosis Date  . 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 Acute upper respiratory infection 04/09/2014  . Anemia   . Arthritis    back  . Back pain   . 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 Cataract   . Cataracts, bilateral 07/25/2015  . Cervical cancer screening 07/07/2012  . Colon polyps   . Elevated serum creatinine    with ACE inhibitor (Normal MRA of renal arteries)  . GERD (gastroesophageal reflux disease)   . History of cyst of breast   . Hoarseness of voice 03/13/2012  . Hypertension   . Lactose intolerance   . Medicare annual wellness visit, subsequent 09/02/2014  . Muscle spasm of left lower extremity 03/21/2007   Qualifier: Diagnosis of  By: Wynona Luna   . Osteoarthritis   . Osteopenia 08/17/2014  . Preventative health care 04/05/2016  . Shortness of breath   . Skin lesion 07/10/2012  . Urinary incontinence 07/10/2012  . Vitamin D deficiency 04/03/2016    Past Surgical History:  Procedure Laterality Date  . APPENDECTOMY    . BREAST CYST EXCISION Bilateral    right breast, twice  . OVARY SURGERY     right ovary & tube replacement  . WRIST GANGLION EXCISION     rt    Family History  Problem Relation Age of Onset  . Brain cancer Mother        deceased age 71  . Alcohol abuse Mother   . Obesity Mother   . Other Brother          died of sepsis  . Gout Brother   . Alcohol abuse Father   . Cancer Sister        throat  . Pulmonary embolism Daughter   . Mental illness Daughter        anxiety  . Diabetes Maternal Aunt   . Diabetes Maternal Aunt   . Seizures Maternal Aunt   . Birth defects Maternal Aunt   . Lupus Grandchild   . Sickle cell anemia Grandchild   . Colon cancer Neg Hx   . Breast cancer Neg Hx     Social History   Socioeconomic History  . Marital status: Married    Spouse name: Shanon Brow  . Number of children: Not on file  . Years of education: Not on file  . Highest education level: Not on file  Occupational History  . Occupation: retired  Tobacco Use  . Smoking status: Former Smoker    Quit date: 01/13/1991    Years since quitting: 28.0  . Smokeless tobacco: Never Used  . Tobacco comment: quit 20 years ago 1/2 ppd x 40 years  Substance and Sexual Activity  . Alcohol use: No  . Drug use: No  . Sexual activity: Never    Comment:  lives with husband, no dietary restrictions,   Other Topics Concern  . Not on file  Social History Narrative   Former Smoker - quit 20 years ago (1/2 ppd x 40 years)   Widowed   1 Daughter        Social Determinants of Radio broadcast assistant Strain:   . Difficulty of Paying Living Expenses: Not on file  Food Insecurity:   . Worried About Charity fundraiser in the Last Year: Not on file  . Ran Out of Food in the Last Year: Not on file  Transportation Needs:   . Lack of Transportation (Medical): Not on file  . Lack of Transportation (Non-Medical): Not on file  Physical Activity:   . Days of Exercise per Week: Not on file  . Minutes of Exercise per Session: Not on file  Stress:   . Feeling of Stress : Not on file  Social Connections:   . Frequency of Communication with Friends and Family: Not on file  . Frequency of Social Gatherings with Friends and Family: Not on file  . Attends Religious Services: Not on file  . Active Member of Clubs or  Organizations: Not on file  . Attends Archivist Meetings: Not on file  . Marital Status: Not on file  Intimate Partner Violence:   . Fear of Current or Ex-Partner: Not on file  . Emotionally Abused: Not on file  . Physically Abused: Not on file  . Sexually Abused: Not on file    Outpatient Medications Prior to Visit  Medication Sig Dispense Refill  . acetaminophen-codeine (TYLENOL #3) 300-30 MG tablet Take 1-2 tablets by mouth every 4 (four) hours as needed for moderate pain or severe pain. 40 tablet 0  . albuterol (VENTOLIN HFA) 108 (90 Base) MCG/ACT inhaler Inhale 2 puffs into the lungs every 6 (six) hours as needed for wheezing or shortness of breath. 18 g 1  . aspirin 81 MG tablet Take 81 mg by mouth daily.      . calcium carbonate (OS-CAL) 600 MG TABS Take 600 mg by mouth daily.      Marland Kitchen dicyclomine (BENTYL) 20 MG tablet Take 1 tablet (20 mg total) by mouth 3 (three) times daily as needed for spasms. 20 tablet 0  . Garlic [id number] MG CAPS Take 1 capsule by mouth daily.    Javier Docker Oil 1000 MG CAPS Take 1,000 capsules by mouth daily.    . Multiple Vitamin (MULTIVITAMIN) tablet Take 1 tablet by mouth daily.    . NORVASC 10 MG tablet Take 1 tablet (10 mg total) by mouth daily. 90 tablet 3  . promethazine-dextromethorphan (PROMETHAZINE-DM) 6.25-15 MG/5ML syrup Take 5 mLs by mouth 4 (four) times daily as needed. 118 mL 0  . SYMBICORT 80-4.5 MCG/ACT inhaler INHALE 2 PUFFS INTO THE LUNGS 2 (TWO) TIMES DAILY. 10.2 g 2  . XYZAL 5 MG tablet Take 1 tablet (5 mg total) by mouth every evening. 90 tablet 3  . famotidine (PEPCID) 20 MG tablet TAKE 1 TABLET(20 MG) BY MOUTH TWICE DAILY 180 tablet 1  . omeprazole (PRILOSEC) 20 MG capsule Take 1 capsule (20 mg total) by mouth daily as needed. 30 capsule 5   No facility-administered medications prior to visit.    Allergies  Allergen Reactions  . Allegra [Fexofenadine] Shortness Of Breath  . Erythromycin Nausea Only  . Oxycodone-Acetaminophen  Nausea And Vomiting  . Propoxyphene N-Acetaminophen Nausea And Vomiting      severe vomiting  .  Zocor [Simvastatin] Other (See Comments)      Myalgia    Review of Systems  Constitutional: Positive for malaise/fatigue. Negative for fever.  HENT: Negative for congestion.   Eyes: Negative for blurred vision.  Respiratory: Negative for shortness of breath.   Cardiovascular: Negative for chest pain, palpitations and leg swelling.  Gastrointestinal: Negative for abdominal pain, blood in stool and nausea.  Genitourinary: Negative for dysuria and frequency.  Musculoskeletal: Positive for joint pain. Negative for falls.  Skin: Negative for rash.  Neurological: Negative for dizziness, loss of consciousness and headaches.  Endo/Heme/Allergies: Negative for environmental allergies.  Psychiatric/Behavioral: Negative for depression. The patient is not nervous/anxious.        Objective:    Physical Exam Vitals and nursing note reviewed.  Constitutional:      General: She is not in acute distress.    Appearance: She is well-developed.  HENT:     Head: Normocephalic and atraumatic.     Nose: Nose normal.  Eyes:     General:        Right eye: No discharge.        Left eye: No discharge.  Cardiovascular:     Rate and Rhythm: Normal rate and regular rhythm.     Heart sounds: No murmur.  Pulmonary:     Effort: Pulmonary effort is normal.     Breath sounds: Normal breath sounds.  Abdominal:     General: Bowel sounds are normal.     Palpations: Abdomen is soft.     Tenderness: There is no abdominal tenderness.  Musculoskeletal:     Cervical back: Normal range of motion and neck supple.  Skin:    General: Skin is warm and dry.  Neurological:     Mental Status: She is alert and oriented to person, place, and time.     BP 126/80 (BP Location: Left Arm, Patient Position: Sitting, Cuff Size: Normal)   Pulse 73   Temp 97.6 F (36.4 C) (Temporal)   LMP  (LMP Unknown)  Wt Readings from  Last 3 Encounters:  08/09/18 216 lb 6.4 oz (98.2 kg)  03/29/18 215 lb (97.5 kg)  03/15/18 219 lb (99.3 kg)    Diabetic Foot Exam - Simple   No data filed     Lab Results  Component Value Date   WBC 5.2 02/09/2019   HGB 13.5 02/09/2019   HCT 41.3 02/09/2019   PLT 203.0 02/09/2019   GLUCOSE 76 02/09/2019   CHOL 185 02/09/2019   TRIG 66.0 02/09/2019   HDL 43.00 02/09/2019   LDLCALC 129 (H) 02/09/2019   ALT 11 02/09/2019   AST 12 02/09/2019   NA 141 02/09/2019   K 4.3 02/09/2019   CL 105 02/09/2019   CREATININE 1.18 02/09/2019   BUN 21 02/09/2019   CO2 28 02/09/2019   TSH 2.96 02/09/2019   HGBA1C 5.4 02/09/2019    Lab Results  Component Value Date   TSH 2.96 02/09/2019   Lab Results  Component Value Date   WBC 5.2 02/09/2019   HGB 13.5 02/09/2019   HCT 41.3 02/09/2019   MCV 87.9 02/09/2019   PLT 203.0 02/09/2019   Lab Results  Component Value Date   NA 141 02/09/2019   K 4.3 02/09/2019   CO2 28 02/09/2019   GLUCOSE 76 02/09/2019   BUN 21 02/09/2019   CREATININE 1.18 02/09/2019   BILITOT 0.4 02/09/2019   ALKPHOS 51 02/09/2019   AST 12 02/09/2019   ALT 11 02/09/2019  PROT 6.7 02/09/2019   ALBUMIN 3.9 02/09/2019   CALCIUM 9.1 02/09/2019   ANIONGAP 7 02/26/2018   GFR 53.46 (L) 02/09/2019   Lab Results  Component Value Date   CHOL 185 02/09/2019   Lab Results  Component Value Date   HDL 43.00 02/09/2019   Lab Results  Component Value Date   LDLCALC 129 (H) 02/09/2019   Lab Results  Component Value Date   TRIG 66.0 02/09/2019   Lab Results  Component Value Date   CHOLHDL 4 02/09/2019   Lab Results  Component Value Date   HGBA1C 5.4 02/09/2019       Assessment & Plan:   Problem List Items Addressed This Visit    Hyperlipidemia, mixed - Primary    Tolerating statin, encouraged heart healthy diet, avoid trans fats, minimize simple carbs and saturated fats. Increase exercise as tolerated      Relevant Orders   Lipid panel (Completed)    Essential hypertension    Well controlled, no changes to meds. Encouraged heart healthy diet such as the DASH diet and exercise as tolerated.       Relevant Orders   CBC (Completed)   Comprehensive metabolic panel (Completed)   TSH (Completed)   GERD    Avoid offending foods, start probiotics. Do not eat large meals in late evening and consider raising head of bed. Famotidine      Relevant Medications   famotidine (PEPCID) 20 MG tablet   Pain in both knees    She notes the majority of the pain and some weakness is noted over there lateral aspects of both knees. Has been worsening over several months and she is worried about staying activity. XRays confirm mild to moderate arthritis will refer for physical therapy and consider ortho if no improvement      Relevant Orders   DG Knee Complete 4 Views Left (Completed)   DG Knee Complete 4 Views Right (Completed)   Ambulatory referral to Physical Therapy   Osteopenia   Vitamin D deficiency    Supplement and monitor      Relevant Orders   VITAMIN D 25 Hydroxy (Vit-D Deficiency, Fractures) (Completed)   Insulin resistance    hgba1c acceptable, minimize simple carbs. Increase exercise as tolerated.       Relevant Orders   Hemoglobin A1c (Completed)      I have discontinued Rogue Jury. [place] "Dot"'s omeprazole. I am also having her maintain her aspirin, calcium carbonate, Garlic, multivitamin, Krill Oil, dicyclomine, Symbicort, Xyzal, Norvasc, promethazine-dextromethorphan, albuterol, acetaminophen-codeine, and famotidine.  Meds ordered this encounter  Medications  . famotidine (PEPCID) 20 MG tablet    Sig: TAKE 1 TABLET(20 MG) BY MOUTH TWICE DAILY    Dispense:  180 tablet    Refill:  1    **Patient requests 90 days supply**     Penni Homans, MD

## 2019-02-09 NOTE — Assessment & Plan Note (Signed)
Tolerating statin, encouraged heart healthy diet, avoid trans fats, minimize simple carbs and saturated fats. Increase exercise as tolerated 

## 2019-02-09 NOTE — Patient Instructions (Addendum)
Multivtamin with minerals selenium Probiotic daily Keep taking Vitamin D and Aspirin 81 mg daily Melatonin 2-5 mg at daily at bedtime Hypertension, Adult High blood pressure (hypertension) is when the force of blood pumping through the arteries is too strong. The arteries are the blood vessels that carry blood from the heart throughout the body. Hypertension forces the heart to work harder to pump blood and may cause arteries to become narrow or stiff. Untreated or uncontrolled hypertension can cause a heart attack, heart failure, a stroke, kidney disease, and other problems. A blood pressure reading consists of a higher number over a lower number. Ideally, your blood pressure should be below 120/80. The first ("top") number is called the systolic pressure. It is a measure of the pressure in your arteries as your heart beats. The second ("bottom") number is called the diastolic pressure. It is a measure of the pressure in your arteries as the heart relaxes. What are the causes? The exact cause of this condition is not known. There are some conditions that result in or are related to high blood pressure. What increases the risk? Some risk factors for high blood pressure are under your control. The following factors may make you more likely to develop this condition:  Smoking.  Having type 2 diabetes mellitus, high cholesterol, or both.  Not getting enough exercise or physical activity.  Being overweight.  Having too much fat, sugar, calories, or salt (sodium) in your diet.  Drinking too much alcohol. Some risk factors for high blood pressure may be difficult or impossible to change. Some of these factors include:  Having chronic kidney disease.  Having a family history of high blood pressure.  Age. Risk increases with age.  Race. You may be at higher risk if you are African American.  Gender. Men are at higher risk than women before age 41. After age 15, women are at higher risk than  men.  Having obstructive sleep apnea.  Stress. What are the signs or symptoms? High blood pressure may not cause symptoms. Very high blood pressure (hypertensive crisis) may cause:  Headache.  Anxiety.  Shortness of breath.  Nosebleed.  Nausea and vomiting.  Vision changes.  Severe chest pain.  Seizures. How is this diagnosed? This condition is diagnosed by measuring your blood pressure while you are seated, with your arm resting on a flat surface, your legs uncrossed, and your feet flat on the floor. The cuff of the blood pressure monitor will be placed directly against the skin of your upper arm at the level of your heart. It should be measured at least twice using the same arm. Certain conditions can cause a difference in blood pressure between your right and left arms. Certain factors can cause blood pressure readings to be lower or higher than normal for a short period of time:  When your blood pressure is higher when you are in a health care provider's office than when you are at home, this is called white coat hypertension. Most people with this condition do not need medicines.  When your blood pressure is higher at home than when you are in a health care provider's office, this is called masked hypertension. Most people with this condition may need medicines to control blood pressure. If you have a high blood pressure reading during one visit or you have normal blood pressure with other risk factors, you may be asked to:  Return on a different day to have your blood pressure checked again.  Monitor  your blood pressure at home for 1 week or longer. If you are diagnosed with hypertension, you may have other blood or imaging tests to help your health care provider understand your overall risk for other conditions. How is this treated? This condition is treated by making healthy lifestyle changes, such as eating healthy foods, exercising more, and reducing your alcohol  intake. Your health care provider may prescribe medicine if lifestyle changes are not enough to get your blood pressure under control, and if:  Your systolic blood pressure is above 130.  Your diastolic blood pressure is above 80. Your personal target blood pressure may vary depending on your medical conditions, your age, and other factors. Follow these instructions at home: Eating and drinking   Eat a diet that is high in fiber and potassium, and low in sodium, added sugar, and fat. An example eating plan is called the DASH (Dietary Approaches to Stop Hypertension) diet. To eat this way: ? Eat plenty of fresh fruits and vegetables. Try to fill one half of your plate at each meal with fruits and vegetables. ? Eat whole grains, such as whole-wheat pasta, brown rice, or whole-grain bread. Fill about one fourth of your plate with whole grains. ? Eat or drink low-fat dairy products, such as skim milk or low-fat yogurt. ? Avoid fatty cuts of meat, processed or cured meats, and poultry with skin. Fill about one fourth of your plate with lean proteins, such as fish, chicken without skin, beans, eggs, or tofu. ? Avoid pre-made and processed foods. These tend to be higher in sodium, added sugar, and fat.  Reduce your daily sodium intake. Most people with hypertension should eat less than 1,500 mg of sodium a day.  Do not drink alcohol if: ? Your health care provider tells you not to drink. ? You are pregnant, may be pregnant, or are planning to become pregnant.  If you drink alcohol: ? Limit how much you use to:  0-1 drink a day for women.  0-2 drinks a day for men. ? Be aware of how much alcohol is in your drink. In the [place] one drink equals one 12 oz bottle of beer (355 mL), one 5 oz glass of wine (148 mL), or one 1 oz glass of hard liquor (44 mL). Lifestyle   Work with your health care provider to maintain a healthy body weight or to lose weight. Ask what an ideal weight is for  you.  Get at least 30 minutes of exercise most days of the week. Activities may include walking, swimming, or biking.  Include exercise to strengthen your muscles (resistance exercise), such as Pilates or lifting weights, as part of your weekly exercise routine. Try to do these types of exercises for 30 minutes at least 3 days a week.  Do not use any products that contain nicotine or tobacco, such as cigarettes, e-cigarettes, and chewing tobacco. If you need help quitting, ask your health care provider.  Monitor your blood pressure at home as told by your health care provider.  Keep all follow-up visits as told by your health care provider. This is important. Medicines  Take over-the-counter and prescription medicines only as told by your health care provider. Follow directions carefully. Blood pressure medicines must be taken as prescribed.  Do not skip doses of blood pressure medicine. Doing this puts you at risk for problems and can make the medicine less effective.  Ask your health care provider about side effects or reactions to medicines that  you should watch for. Contact a health care provider if you:  Think you are having a reaction to a medicine you are taking.  Have headaches that keep coming back (recurring).  Feel dizzy.  Have swelling in your ankles.  Have trouble with your vision. Get help right away if you:  Develop a severe headache or confusion.  Have unusual weakness or numbness.  Feel faint.  Have severe pain in your chest or abdomen.  Vomit repeatedly.  Have trouble breathing. Summary  Hypertension is when the force of blood pumping through your arteries is too strong. If this condition is not controlled, it may put you at risk for serious complications.  Your personal target blood pressure may vary depending on your medical conditions, your age, and other factors. For most people, a normal blood pressure is less than 120/80.  Hypertension is  treated with lifestyle changes, medicines, or a combination of both. Lifestyle changes include losing weight, eating a healthy, low-sodium diet, exercising more, and limiting alcohol. This information is not intended to replace advice given to you by your health care provider. Make sure you discuss any questions you have with your health care provider. Document Revised: 09/08/2017 Document Reviewed: 09/08/2017 Elsevier Patient Education  2020 [employer].

## 2019-02-09 NOTE — Assessment & Plan Note (Signed)
Avoid offending foods, start probiotics. Do not eat large meals in late evening and consider raising head of bed. Famotidine

## 2019-02-09 NOTE — Assessment & Plan Note (Signed)
hgba1c acceptable, minimize simple carbs. Increase exercise as tolerated.  

## 2019-02-09 NOTE — Assessment & Plan Note (Signed)
Well controlled, no changes to meds. Encouraged heart healthy diet such as the DASH diet and exercise as tolerated.  °

## 2019-02-14 ENCOUNTER — Encounter: Payer: Self-pay | Admitting: Physical Therapy

## 2019-02-14 ENCOUNTER — Ambulatory Visit: Payer: Medicare Other | Attending: Family Medicine | Admitting: Physical Therapy

## 2019-02-14 ENCOUNTER — Other Ambulatory Visit: Payer: Self-pay

## 2019-02-14 DIAGNOSIS — G8929 Other chronic pain: Secondary | ICD-10-CM | POA: Diagnosis present

## 2019-02-14 DIAGNOSIS — M25561 Pain in right knee: Secondary | ICD-10-CM | POA: Insufficient documentation

## 2019-02-14 DIAGNOSIS — R262 Difficulty in walking, not elsewhere classified: Secondary | ICD-10-CM

## 2019-02-14 DIAGNOSIS — M25562 Pain in left knee: Secondary | ICD-10-CM | POA: Insufficient documentation

## 2019-02-14 NOTE — Therapy (Signed)
[facility] [address]  [place], [place], [address] Phone: [phone]   Fax:  [phone]  Physical Therapy Evaluation  Patient Details  Name: Terri Mckee MRN: [id number] Date of Birth: 25-Jan-1940 Referring Provider (PT): Penni Homans, MD   Encounter Date: 02/14/2019  PT End of Session - 02/14/19 1710    Visit Number  1    Number of Visits  7    Date for PT Re-Evaluation  03/28/19    Authorization Type  [employer]    PT Start Time  1309    PT Stop Time  [id number]    PT Time Calculation (min)  46 min    Activity Tolerance  Patient tolerated treatment well    Behavior During Therapy  [facility] for tasks assessed/performed       Past Medical History:  Diagnosis Date  . 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 Acute upper respiratory infection 04/09/2014  . Anemia   . Arthritis    back  . Back pain   . 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 Cataract   . Cataracts, bilateral 07/25/2015  . Cervical cancer screening 07/07/2012  . Colon polyps   . Elevated serum creatinine    with ACE inhibitor (Normal MRA of renal arteries)  . GERD (gastroesophageal reflux disease)   . History of cyst of breast   . Hoarseness of voice 03/13/2012  . Hypertension   . Lactose intolerance   . Medicare annual wellness visit, subsequent 09/02/2014  . Muscle spasm of left lower extremity 03/21/2007   Qualifier: Diagnosis of  By: Wynona Luna   . Osteoarthritis   . Osteopenia 08/17/2014  . Preventative health care 04/05/2016  . Shortness of breath   . Skin lesion 07/10/2012  . Urinary incontinence 07/10/2012  . Vitamin D deficiency 04/03/2016    Past Surgical History:  Procedure Laterality Date  . APPENDECTOMY    . BREAST CYST EXCISION Bilateral    right breast, twice  . OVARY SURGERY     right ovary & tube replacement  . WRIST GANGLION EXCISION     rt    There  were no vitals filed for this visit.   Subjective Assessment - 02/14/19 1311    Subjective  Patient reports B knee pain, R worse than L for the past year. Notes that the R knee gave out this morning, but caught herself on the wall. Has not had any other falls. Pain occurs over medial and lateral aspects of B knee and feels like her muscles have gotten weak. Denies N/T. Worse with prolonged standing, getting up from sitting, stairs, and in PM. Better with Voltaren cream.    Pertinent History  SOB, osteopenia, OA, HTN, GERD, R CTS, back pain, anemia    Limitations  Standing;Walking;House hold activities    How long can you sit comfortably?  unlimited    How long can you stand comfortably?  10-15 minutes, but limited by LBP    How long can you walk comfortably?  30 minutes    Diagnostic tests  02/09/19 L & R knee xray: No fracture or dislocation of the bilateral knees. Symmetric, mild bilateral medial and lateral compartment and moderate bilateral patellofemoral compartment osteoarthritis. Small, nonspecific bilateral knee joint effusions.    Patient Stated Goals  work on leg strength    Currently in Pain?  Yes    Pain Score  0-No pain    Pain Location  Knee    Pain Orientation  Right;Left    Pain Descriptors / Indicators  Aching    Pain Type  Chronic pain         [facility] PT Assessment - 02/14/19 1318      Assessment   Medical Diagnosis  Pain in B knees, unspecified chronicity     Referring Provider (PT)  Penni Homans, MD    Onset Date/Surgical Date  02/13/18    Next MD Visit  08/10/19    Prior Therapy  yes- several years ago      Precautions   Precautions  None      Balance Screen   Has the patient fallen in the past 6 months  No    Has the patient had a decrease in activity level because of a fear of falling?   No    Is the patient reluctant to leave their home because of a fear of falling?   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    Leisure  walking on the [employer]    Overall Cognitive Status  Within Functional Limits for tasks assessed      Observation/Other Assessments   Observations  edema over B superior, medial, and lateral aspects of B knees    Focus on Therapeutic Outcomes (FOTO)   Knee: 51 (49% limited, 41% predicted)      Sensation   Light Touch  Appears Intact      Coordination   Gross Motor Movements are Fluid and Coordinated  Yes      Posture/Postural Control   Posture/Postural Control  Postural limitations    Postural Limitations  Rounded Shoulders;Forward head;Increased thoracic kyphosis      ROM / Strength   AROM / PROM / Strength  AROM;Strength;PROM      AROM   AROM Assessment Site  Knee    Right/Left Knee  Right;Left    Right Knee Extension  0    Right Knee Flexion  120    Left Knee Extension  0    Left Knee Flexion  127      PROM   PROM Assessment Site  Knee    Right/Left Knee  Right;Left    Right Knee Extension  -1    Right Knee Flexion  125    Left Knee Extension  0    Left Knee Flexion  130      Strength   Strength Assessment Site  Hip;Knee;Ankle    Right/Left Hip  Right;Left    Right Hip Flexion  4+/5    Right Hip ABduction  4/5    Right Hip ADduction  4/5    Left Hip Flexion  4+/5    Left Hip ABduction  4/5    Left Hip ADduction  4/5    Right/Left Knee  Right;Left    Right Knee Flexion  4+/5    Right Knee Extension  5/5   pain   Left Knee Flexion  4+/5    Left Knee Extension  5/5    Right/Left Ankle  Right;Left    Right  Ankle Dorsiflexion  4+/5    Right Ankle Plantar Flexion  4+/5    Left Ankle Dorsiflexion  4+/5    Left Ankle Plantar Flexion  4+/5      Flexibility   Soft Tissue Assessment /Muscle Length  yes    Hamstrings  B severely tight hip flexor/quads R>L    Quadriceps  B severely tight hip flexor/quads R>L      Palpation   Patella mobility  B limited mobility sup/inf    Palpation comment  TTP over R medial and lateral jt lines and L distal medial HS      Ambulation/Gait   Assistive device  None    Gait Pattern  Step-through pattern;Decreased step length - right;Decreased step length - left;Decreased weight shift to right;Lateral trunk lean to left;Lateral trunk lean to right;Trunk flexed    Ambulation Surface  Level;Indoor    Gait velocity  decreased                Objective measurements completed on examination: See above findings.              PT Education - 02/14/19 1710    Education Details  prognosis, POC, HEP    Person(s) Educated  Patient    Methods  Explanation;Demonstration;Tactile cues;Verbal cues;Handout    Comprehension  Verbalized understanding;Returned demonstration       PT Short Term Goals - 02/14/19 1717      PT SHORT TERM GOAL #1   Title  Patient to be independent with initial HEP.    Time  3    Period  Weeks    Status  New    Target Date  03/07/19        PT Long Term Goals - 02/14/19 1717      PT LONG TERM GOAL #1   Title  Patient to be independent with advanced HEP.    Time  6    Period  Weeks    Status  New    Target Date  03/28/19      PT LONG TERM GOAL #2   Title  Patient to demonstrate B hip strength >/=4+/5.    Time  6    Period  Weeks    Status  New    Target Date  03/28/19      PT LONG TERM GOAL #3   Title  Patient to demonstrate stair climbing up/down 20 steps with 1 handrail as needed with <3/10 pain and good stability.    Time  6    Period  Weeks    Status  New    Target Date  03/28/19      PT LONG TERM GOAL  #4   Title  Patient to report 75% improvement in pain with STS transfers.    Time  6    Period  Weeks    Status  New    Target Date  03/28/19             Plan - 02/14/19 1711    Clinical Impression Statement  Patient is a 79y/o F presenting to [facility] with c/o B knee pain,  R>L, for the past year. Has had episode of R knee buckling but denies falls. Pain occurs over medial and lateral aspects of B knee and complaints of weakness. Aggravating factors include prolonged standing, STS transfers, stairs, and in PM. 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 Patient today presenting with edema over B knees, rounded posture, good B knee ROM, limited B hip strength, considerable tightness in B HS and quads, decreased B patellar mobility, and gait deviations. Educated patient on gentle stretching and strengthening HEP- patient reported understanding. Would benefit from skilled PT services 1x/week for 6 weeks to address aforementioned impairments.    Personal Factors and Comorbidities  Age;Comorbidity 3+;Fitness;Past/Current Experience;Profession;Time since onset of injury/illness/exacerbation    Comorbidities  SOB, osteopenia, OA, HTN, GERD, R CTS, back pain, anemia    Examination-Activity Limitations  Sleep;Bend;Squat;Stairs;Caring for Others;Stand;Transfers;Lift;Locomotion Level    Examination-Participation Restrictions  Church;Cleaning;Shop;Community Activity;Yard Work;Laundry;Interpersonal Relationship;Meal Prep    Stability/Clinical Decision Making  Stable/Uncomplicated    Clinical Decision Making  Low    Rehab Potential  Good    PT Frequency  1x / week    PT Duration  6 weeks    PT Treatment/Interventions  ADLs/Self Care Home Management;Cryotherapy;Electrical Stimulation;Moist Heat;Balance training;Therapeutic exercise;Therapeutic activities;Functional mobility training;Stair training;Gait  training;Ultrasound;Neuromuscular re-education;Patient/family education;Manual techniques;Vasopneumatic Device;Taping;Energy conservation;Dry needling;Passive range of motion    PT Next Visit Plan  reassess HEP; progress LE strengthening and stretching    Consulted and Agree with Plan of Care  Patient       Patient will benefit from skilled therapeutic intervention in order to improve the following deficits and impairments:  Hypomobility, Increased edema, Decreased activity tolerance, Decreased strength, Increased fascial restricitons, Pain, Increased muscle spasms, Difficulty walking, Decreased balance, Decreased range of motion, Improper body mechanics, Postural dysfunction, Impaired flexibility  Visit Diagnosis: Chronic pain of right knee  Chronic pain of left knee  Difficulty in walking, not elsewhere classified     Problem List Patient Active Problem List   Diagnosis Date Noted  . 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 Class 1 obesity with serious comorbidity and body mass index (BMI) of 31.0 to 31.9 in adult 03/16/2018  . Muscle cramp, nocturnal 06/14/2017  . Preventative health care 04/05/2016  . Vitamin D deficiency 04/03/2016  . Cataracts, bilateral 07/25/2015  . Left shoulder pain 01/29/2015  . Breast cancer screening 09/02/2014  . Decreased visual acuity 09/02/2014  . Hearing loss 09/02/2014  . Left hip pain 09/02/2014  . Medicare annual wellness visit, subsequent 09/02/2014  . Osteopenia 08/17/2014  . Right knee pain 09/24/2013  . Low back pain 12/03/2012  . Skin lesion 07/10/2012  . Urinary incontinence 07/10/2012  . Cervical cancer screening 07/07/2012  . Cough 04/01/2012  . Hoarseness of voice 03/13/2012  . Cervical radiculopathy 12/03/2011  . GERD 09/13/2009  . BREAST MASS, LEFT 02/22/2009  . Pain in both knees 12/21/2008  . 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 Hyperlipidemia, mixed 10/28/2007  . INSOMNIA 10/28/2007  . Allergic rhinitis 07/01/2007  . WRIST PAIN,  RIGHT 03/21/2007  . Muscle spasm of left lower extremity 03/21/2007  . Essential hypertension 08/06/2006  . 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

## 2019-02-21 ENCOUNTER — Encounter: Payer: Self-pay | Admitting: Physical Therapy

## 2019-02-21 ENCOUNTER — Ambulatory Visit: Payer: Medicare Other | Admitting: Physical Therapy

## 2019-02-21 ENCOUNTER — Other Ambulatory Visit: Payer: Self-pay

## 2019-02-21 DIAGNOSIS — M25561 Pain in right knee: Secondary | ICD-10-CM | POA: Diagnosis not present

## 2019-02-21 DIAGNOSIS — R262 Difficulty in walking, not elsewhere classified: Secondary | ICD-10-CM

## 2019-02-21 DIAGNOSIS — G8929 Other chronic pain: Secondary | ICD-10-CM

## 2019-02-21 DIAGNOSIS — M25562 Pain in left knee: Secondary | ICD-10-CM

## 2019-02-21 NOTE — Therapy (Signed)
[facility] [address]  [place], [place], [address] Phone: [phone]   Fax:  [phone]  Physical Therapy Treatment  Patient Details  Name: Terri Mckee MRN: [id number] Date of Birth: [birth year] Referring Provider (PT): Penni Homans, MD   Encounter Date: 02/21/2019  PT End of Session - 02/21/19 1423    Visit Number  2    Number of Visits  7    Date for PT Re-Evaluation  03/28/19    Authorization Type  [employer]    PT Start Time  1315    PT Stop Time  1357    PT Time Calculation (min)  42 min    Activity Tolerance  Patient tolerated treatment well    Behavior During Therapy  [facility] for tasks assessed/performed       Past Medical History:  Diagnosis Date  . 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 Acute upper respiratory infection 04/09/2014  . Anemia   . Arthritis    back  . Back pain   . 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 Cataract   . Cataracts, bilateral 07/25/2015  . Cervical cancer screening 07/07/2012  . Colon polyps   . Elevated serum creatinine    with ACE inhibitor (Normal MRA of renal arteries)  . GERD (gastroesophageal reflux disease)   . History of cyst of breast   . Hoarseness of voice 03/13/2012  . Hypertension   . Lactose intolerance   . Medicare annual wellness visit, subsequent 09/02/2014  . Muscle spasm of left lower extremity 03/21/2007   Qualifier: Diagnosis of  By: Wynona Luna   . Osteoarthritis   . Osteopenia 08/17/2014  . Preventative health care 04/05/2016  . Shortness of breath   . Skin lesion 07/10/2012  . Urinary incontinence 07/10/2012  . Vitamin D deficiency 04/03/2016    Past Surgical History:  Procedure Laterality Date  . APPENDECTOMY    . BREAST CYST EXCISION Bilateral    right breast, twice  . OVARY SURGERY     right ovary & tube replacement  . WRIST GANGLION EXCISION     rt    There were  no vitals filed for this visit.  Subjective Assessment - 02/21/19 1316    Subjective  Doing alright. Hasn't see a whole lot of change in her knees so far.    Pertinent History  SOB, osteopenia, OA, HTN, GERD, R CTS, back pain, anemia    Diagnostic tests  02/09/19 L & R knee xray: No fracture or dislocation of the bilateral knees. Symmetric, mild bilateral medial and lateral compartment and moderate bilateral patellofemoral compartment osteoarthritis. Small, nonspecific bilateral knee joint effusions.    Patient Stated Goals  work on leg strength    Currently in Pain?  Yes    Pain Score  3     Pain Location  Knee    Pain Orientation  Right    Pain Descriptors / Indicators  Aching    Pain Type  Chronic pain                       [facility] Adult PT Treatment/Exercise - 02/21/19 0001      Exercises   Exercises  Knee/Hip      Knee/Hip Exercises: Stretches   Passive Hamstring Stretch  Right;Left;30 seconds;2 reps    Passive Hamstring Stretch Limitations  supine with strap   manual cues for proper positioning and avoiding valsalva    Hip Flexor Stretch  Right;Left;1 rep;30 seconds    Hip Flexor Stretch Limitations  mod thomas with strap   cues to avoid pushing into pain     Knee/Hip Exercises: Aerobic   Nustep  L2 x 95min (UEs/LEs)      Knee/Hip Exercises: Seated   Sit to Sand  1 set;5 reps;without UE support      Knee/Hip Exercises: Sidelying   Clams  x10 each LE   heavy correction of rotation hips     Manual Therapy   Manual Therapy  Joint mobilization;Taping    Manual therapy comments  supine    Joint Mobilization  R/L patellar mobilizations in superior, inferior, medial, lateralk directions grade II to tolerance   slightly hypomobile on L with sup/inf direction   Kinesiotex  Create Space      Kinesiotix   Create Space  B chondromalacia patellae pattern with 50% stretch on long strips and 80% stretch on short strip             PT Education - 02/21/19  1422    Education Details  edu on KT tape use, wear time, precautions, removal    Person(s) Educated  Patient    Methods  Explanation;Demonstration;Tactile cues;Verbal cues;Handout    Comprehension  Verbalized understanding       PT Short Term Goals - 02/21/19 1427      PT SHORT TERM GOAL #1   Title  Patient to be independent with initial HEP.    Time  3    Period  Weeks    Status  On-going    Target Date  03/07/19        PT Long Term Goals - 02/21/19 1427      PT LONG TERM GOAL #1   Title  Patient to be independent with advanced HEP.    Time  6    Period  Weeks    Status  On-going      PT LONG TERM GOAL #2   Title  Patient to demonstrate B hip strength >/=4+/5.    Time  6    Period  Weeks    Status  On-going      PT LONG TERM GOAL #3   Title  Patient to demonstrate stair climbing up/down 20 steps with 1 handrail as needed with <3/10 pain and good stability.    Time  6    Period  Weeks    Status  On-going      PT LONG TERM GOAL #4   Title  Patient to report 75% improvement in pain with STS transfers.    Time  6    Period  Weeks    Status  On-going            Plan - 02/21/19 1426    Clinical Impression Statement  Patient without new complaints today. Patient tolerated patellar mobilizations to B knees with slight L patellar hypomobility in supero-inferior direction. Worked on LE stretching with intermittent cues for proper positioning. Patient also required cues to avoid Valsalva throughout exercises. Heavy correct of form required with clamshells, as patient with tendency to rotate hips posteriorly and demonstrate lack of eccentric muscle control. Initiated STS without UE support with cues for anterior trunk lean and proper set up. Patient with most difficulty performing controlled sit-down motion. Applied KT tape to B knee for pain relief. Patient reported understanding of all  edu provided on taping. No complaints at end of session.    Comorbidities  SOB,  osteopenia, OA, HTN, GERD, R CTS, back pain, anemia    PT Treatment/Interventions  ADLs/Self Care Home Management;Cryotherapy;Electrical Stimulation;Moist Heat;Balance training;Therapeutic exercise;Therapeutic activities;Functional mobility training;Stair training;Gait training;Ultrasound;Neuromuscular re-education;Patient/family education;Manual techniques;Vasopneumatic Device;Taping;Energy conservation;Dry needling;Passive range of motion    PT Next Visit Plan  progress LE strengthening and stretching    Consulted and Agree with Plan of Care  Patient       Patient will benefit from skilled therapeutic intervention in order to improve the following deficits and impairments:  Hypomobility, Increased edema, Decreased activity tolerance, Decreased strength, Increased fascial restricitons, Pain, Increased muscle spasms, Difficulty walking, Decreased balance, Decreased range of motion, Improper body mechanics, Postural dysfunction, Impaired flexibility  Visit Diagnosis: Chronic pain of right knee  Chronic pain of left knee  Difficulty in walking, not elsewhere classified     Problem List Patient Active Problem List   Diagnosis Date Noted  . 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 Class 1 obesity with serious comorbidity and body mass index (BMI) of 31.0 to 31.9 in adult 03/16/2018  . Muscle cramp, nocturnal 06/14/2017  . Preventative health care 04/05/2016  . Vitamin D deficiency 04/03/2016  . Cataracts, bilateral 07/25/2015  . Left shoulder pain 01/29/2015  . Breast cancer screening 09/02/2014  . Decreased visual acuity 09/02/2014  . Hearing loss 09/02/2014  . Left hip pain 09/02/2014  . Medicare annual wellness visit, subsequent 09/02/2014  . Osteopenia 08/17/2014  . Right knee pain 09/24/2013  . Low back pain 12/03/2012  . Skin lesion 07/10/2012  . Urinary incontinence 07/10/2012  . Cervical cancer screening 07/07/2012  . Cough 04/01/2012  . Hoarseness of voice 03/13/2012  .  Cervical radiculopathy 12/03/2011  . GERD 09/13/2009  . BREAST MASS, LEFT 02/22/2009  . Pain in both knees 12/21/2008  . 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 Hyperlipidemia, mixed 10/28/2007  . INSOMNIA 10/28/2007  . Allergic rhinitis 07/01/2007  . WRIST PAIN, RIGHT 03/21/2007  . Muscle spasm of left lower extremity 03/21/2007  . Essential hypertension 08/06/2006  . 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

## 2019-02-21 NOTE — Patient Instructions (Signed)
   Kinesiology tape  What is kinesiology tape?  There are many brands of kinesiology tape. KTape, Rock Tape, Body Sport, Dynamic tape, to name a few.  It is an elasticized tape designed to support the body's natural healing process. This tape provides stability and support to muscles and joints without restricting motion.  It can also help decrease swelling in the area of application.  How does it work?  The tape microscopically lifts and decompresses the skin to allow for drainage of lymph (swelling) to flow away from area, reducing inflammation. The tape has the ability to help re-educate the neuromuscular system by targeting specific receptors in the skin. The presence of the tape increases the body's awareness of posture and body mechanics.  Do not use with:  . Open wounds . Skin lesions . Adhesive allergies  In some rare cases, mild/moderate skin irritation can occur. This can include redness, itchiness, or hives. If this occurs, immediately remove tape and consult your primary care physician if symptoms are severe or do not resolve within 2 days.  Safe removal of the tape:  To remove tape safely, hold nearby skin with one hand and gentle roll tape down with other hand. You can apply oil or conditioner to tape while in shower prior to removal to loosen adhesive. DO NOT swiftly rip tape off like a band-aid, as this could cause skin tears and additional skin irritation.     For questions, please contact your therapist at:  [facility] [address]  [address] [place], [place], [address] Phone: [phone]   Fax:  [phone]     

## 2019-02-28 ENCOUNTER — Ambulatory Visit: Payer: Medicare Other | Admitting: Physical Therapy

## 2019-02-28 ENCOUNTER — Encounter: Payer: Self-pay | Admitting: Physical Therapy

## 2019-02-28 ENCOUNTER — Other Ambulatory Visit: Payer: Self-pay

## 2019-02-28 DIAGNOSIS — M25561 Pain in right knee: Secondary | ICD-10-CM | POA: Diagnosis not present

## 2019-02-28 DIAGNOSIS — G8929 Other chronic pain: Secondary | ICD-10-CM

## 2019-02-28 DIAGNOSIS — R262 Difficulty in walking, not elsewhere classified: Secondary | ICD-10-CM

## 2019-02-28 NOTE — Therapy (Signed)
[facility] [address]  [place], [place], [address] Phone: [phone]   Fax:  [phone]  Physical Therapy Treatment  Patient Details  Name: Terri Mckee MRN: [id number] Date of Birth: 21-Feb-1940 Referring Provider (PT): Penni Homans, MD   Encounter Date: 02/28/2019  PT End of Session - 02/28/19 1352    Visit Number  3    Number of Visits  7    Date for PT Re-Evaluation  03/28/19    Authorization Type  [employer]    PT Start Time  1310    PT Stop Time  1352    PT Time Calculation (min)  42 min    Activity Tolerance  Patient tolerated treatment well    Behavior During Therapy  [facility] for tasks assessed/performed       Past Medical History:  Diagnosis Date  . 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 Acute upper respiratory infection 04/09/2014  . Anemia   . Arthritis    back  . Back pain   . 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 Cataract   . Cataracts, bilateral 07/25/2015  . Cervical cancer screening 07/07/2012  . Colon polyps   . Elevated serum creatinine    with ACE inhibitor (Normal MRA of renal arteries)  . GERD (gastroesophageal reflux disease)   . History of cyst of breast   . Hoarseness of voice 03/13/2012  . Hypertension   . Lactose intolerance   . Medicare annual wellness visit, subsequent 09/02/2014  . Muscle spasm of left lower extremity 03/21/2007   Qualifier: Diagnosis of  By: Wynona Luna   . Osteoarthritis   . Osteopenia 08/17/2014  . Preventative health care 04/05/2016  . Shortness of breath   . Skin lesion 07/10/2012  . Urinary incontinence 07/10/2012  . Vitamin D deficiency 04/03/2016    Past Surgical History:  Procedure Laterality Date  . APPENDECTOMY    . BREAST CYST EXCISION Bilateral    right breast, twice  . OVARY SURGERY     right ovary & tube replacement  . WRIST GANGLION EXCISION     rt    There  were no vitals filed for this visit.  Subjective Assessment - 02/28/19 1311    Subjective  Doing okay. Feels like the tape helped her get up the steps.    Pertinent History  SOB, osteopenia, OA, HTN, GERD, R CTS, back pain, anemia    Diagnostic tests  02/09/19 L & R knee xray: No fracture or dislocation of the bilateral knees. Symmetric, mild bilateral medial and lateral compartment and moderate bilateral patellofemoral compartment osteoarthritis. Small, nonspecific bilateral knee joint effusions.    Patient Stated Goals  work on leg strength    Currently in Pain?  Yes    Pain Score  4     Pain Location  Knee    Pain Orientation  Right    Pain Descriptors / Indicators  Aching    Pain Type  Chronic pain                       [facility] Adult PT Treatment/Exercise - 02/28/19 0001      Knee/Hip Exercises: Stretches   Passive Hamstring Stretch  Right;Left;30 seconds;2 reps    Passive Hamstring Stretch Limitations  sitting with heel resting on step    Sports administrator  Right;Left;2 reps;30 seconds    Quad Stretch Limitations  prone with strap      Knee/Hip Exercises: Aerobic   Nustep  L2 x 38min (LEs only)      Knee/Hip Exercises: Standing   Knee Flexion  Strengthening;Right;Left;1 set;10 reps    Knee Flexion Limitations  HS curl with red loop around ankles   difficulty on B LEs     Knee/Hip Exercises: Supine   Bridges  --    Bridges with [employer]  Strengthening;Both;1 set;10 reps    Bridges with Cl[employer]  Strengthening;Both;1 set;10 reps   red TB above knees     Knee/Hip Exercises: Sidelying   Hip ABduction  Strengthening;Right;Left;1 set;10 reps    Hip ABduction Limitations  cues for alignment    Hip ADduction  Strengthening;Right;Left;1 set;10 reps    Hip ADduction Limitations  opposite LE resting on bolster   cues to avoid plopping     [employer]   Create Space  B chondromalacia patellae pattern with 50% stretch on long strips and 80% stretch on short strip              PT Education - 02/28/19 1352    Education Details  update to HEP    Person(s) Educated  Patient    Methods  Explanation;Demonstration;Tactile cues;Verbal cues;Handout    Comprehension  Verbalized understanding;Returned demonstration       PT Short Term Goals - 02/21/19 1427      PT SHORT TERM GOAL #1   Title  Patient to be independent with initial HEP.    Time  3    Period  Weeks    Status  On-going    Target Date  03/07/19        PT Long Term Goals - 02/21/19 1427      PT LONG TERM GOAL #1   Title  Patient to be independent with advanced HEP.    Time  6    Period  Weeks    Status  On-going      PT LONG TERM GOAL #2   Title  Patient to demonstrate B hip strength >/=4+/5.    Time  6    Period  Weeks    Status  On-going      PT LONG TERM GOAL #3   Title  Patient to demonstrate stair climbing up/down 20 steps with 1 handrail as needed with <3/10 pain and good stability.    Time  6    Period  Weeks    Status  On-going      PT LONG TERM GOAL #4   Title  Patient to report 75% improvement in pain with STS transfers.    Time  6    Period  Weeks    Status  On-going            Plan - 02/28/19 1352    Clinical Impression Statement  Patient reporting improvement in ability to navigate stairs with help of KT tape. Reporting continued difficulty with modified Thomas stretch, thus attempted prone quad stretch which was better tolerated. Updated HEP with this exercise. Good effort demonstrated with sidelying hip strengthening with cues to increase eccentric control and promote proper alignment. Patient continued to show good effort with glute strengthening ther-ex. Patient demonstrated considerable difficulty with standing HS curl today. Thus, updated HEP with this exercise but with decreased resistance. Patient reported understanding. Ended session with KT tape to B knees. Patient without complaints at end of session.  Comorbidities  SOB, osteopenia, OA,  HTN, GERD, R CTS, back pain, anemia    PT Treatment/Interventions  ADLs/Self Care Home Management;Cryotherapy;Electrical Stimulation;Moist Heat;Balance training;Therapeutic exercise;Therapeutic activities;Functional mobility training;Stair training;Gait training;Ultrasound;Neuromuscular re-education;Patient/family education;Manual techniques;Vasopneumatic Device;Taping;Energy conservation;Dry needling;Passive range of motion    PT Next Visit Plan  progress LE strengthening and stretching    Consulted and Agree with Plan of Care  Patient       Patient will benefit from skilled therapeutic intervention in order to improve the following deficits and impairments:  Hypomobility, Increased edema, Decreased activity tolerance, Decreased strength, Increased fascial restricitons, Pain, Increased muscle spasms, Difficulty walking, Decreased balance, Decreased range of motion, Improper body mechanics, Postural dysfunction, Impaired flexibility  Visit Diagnosis: Chronic pain of right knee  Chronic pain of left knee  Difficulty in walking, not elsewhere classified     Problem List Patient Active Problem List   Diagnosis Date Noted  . 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 Class 1 obesity with serious comorbidity and body mass index (BMI) of 31.0 to 31.9 in adult 03/16/2018  . Muscle cramp, nocturnal 06/14/2017  . Preventative health care 04/05/2016  . Vitamin D deficiency 04/03/2016  . Cataracts, bilateral 07/25/2015  . Left shoulder pain 01/29/2015  . Breast cancer screening 09/02/2014  . Decreased visual acuity 09/02/2014  . Hearing loss 09/02/2014  . Left hip pain 09/02/2014  . Medicare annual wellness visit, subsequent 09/02/2014  . Osteopenia 08/17/2014  . Right knee pain 09/24/2013  . Low back pain 12/03/2012  . Skin lesion 07/10/2012  . Urinary incontinence 07/10/2012  . Cervical cancer screening 07/07/2012  . Cough 04/01/2012  . Hoarseness of voice 03/13/2012  . Cervical radiculopathy  12/03/2011  . GERD 09/13/2009  . BREAST MASS, LEFT 02/22/2009  . Pain in both knees 12/21/2008  . 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 Hyperlipidemia, mixed 10/28/2007  . INSOMNIA 10/28/2007  . Allergic rhinitis 07/01/2007  . WRIST PAIN, RIGHT 03/21/2007  . Muscle spasm of left lower extremity 03/21/2007  . Essential hypertension 08/06/2006  . 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

## 2019-03-02 ENCOUNTER — Other Ambulatory Visit: Payer: Self-pay | Admitting: Family Medicine

## 2019-03-02 DIAGNOSIS — Z1231 Encounter for screening mammogram for malignant neoplasm of breast: Secondary | ICD-10-CM

## 2019-03-06 ENCOUNTER — Telehealth: Payer: Self-pay | Admitting: Family Medicine

## 2019-03-06 ENCOUNTER — Other Ambulatory Visit: Payer: Self-pay | Admitting: Family Medicine

## 2019-03-06 DIAGNOSIS — N649 Disorder of breast, unspecified: Secondary | ICD-10-CM

## 2019-03-06 DIAGNOSIS — L988 Other specified disorders of the skin and subcutaneous tissue: Secondary | ICD-10-CM

## 2019-03-06 NOTE — Telephone Encounter (Signed)
Called to inform the patient left detailed message test ordered.

## 2019-03-06 NOTE — Telephone Encounter (Signed)
Caller Name: Dot, pt Phone: [phone]  Pt called and has a rash/bumps under her breast. [facility] would like order changed to Diagnostic and to order [place] for left and right breast.

## 2019-03-06 NOTE — Telephone Encounter (Signed)
Have ordered [employer] and ultrasounds.

## 2019-03-07 ENCOUNTER — Ambulatory Visit: Payer: Medicare Other

## 2019-03-07 ENCOUNTER — Other Ambulatory Visit: Payer: Self-pay

## 2019-03-07 ENCOUNTER — Telehealth: Payer: Self-pay | Admitting: Family Medicine

## 2019-03-07 DIAGNOSIS — R262 Difficulty in walking, not elsewhere classified: Secondary | ICD-10-CM

## 2019-03-07 DIAGNOSIS — M25561 Pain in right knee: Secondary | ICD-10-CM | POA: Diagnosis not present

## 2019-03-07 DIAGNOSIS — G8929 Other chronic pain: Secondary | ICD-10-CM

## 2019-03-07 DIAGNOSIS — M25562 Pain in left knee: Secondary | ICD-10-CM

## 2019-03-07 NOTE — Telephone Encounter (Signed)
Is in your folder 

## 2019-03-07 NOTE — Telephone Encounter (Signed)
completed

## 2019-03-07 NOTE — Therapy (Signed)
[facility] [address]  [place], [place], [address] Phone: [phone]   Fax:  [phone]  Physical Therapy Treatment  Patient Details  Name: Terri Mckee MRN: [id number] Date of Birth: 12-Nov-1940 Referring Provider (PT): Penni Homans, MD   Encounter Date: 03/07/2019  PT End of Session - 03/07/19 1323    Visit Number  4    Number of Visits  7    Date for PT Re-Evaluation  03/28/19    Authorization Type  [employer]    PT Start Time  1315    PT Stop Time  1359    PT Time Calculation (min)  44 min    Activity Tolerance  Patient tolerated treatment well    Behavior During Therapy  [facility] for tasks assessed/performed       Past Medical History:  Diagnosis Date  . 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 Acute upper respiratory infection 04/09/2014  . Anemia   . Arthritis    back  . Back pain   . 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 Cataract   . Cataracts, bilateral 07/25/2015  . Cervical cancer screening 07/07/2012  . Colon polyps   . Elevated serum creatinine    with ACE inhibitor (Normal MRA of renal arteries)  . GERD (gastroesophageal reflux disease)   . History of cyst of breast   . Hoarseness of voice 03/13/2012  . Hypertension   . Lactose intolerance   . Medicare annual wellness visit, subsequent 09/02/2014  . Muscle spasm of left lower extremity 03/21/2007   Qualifier: Diagnosis of  By: Wynona Luna   . Osteoarthritis   . Osteopenia 08/17/2014  . Preventative health care 04/05/2016  . Shortness of breath   . Skin lesion 07/10/2012  . Urinary incontinence 07/10/2012  . Vitamin D deficiency 04/03/2016    Past Surgical History:  Procedure Laterality Date  . APPENDECTOMY    . BREAST CYST EXCISION Bilateral    right breast, twice  . OVARY SURGERY     right ovary & tube replacement  . WRIST GANGLION EXCISION     rt    There  were no vitals filed for this visit.  Subjective Assessment - 03/07/19 1323    Subjective  Pt. reporting no issues with HEP.    Pertinent History  SOB, osteopenia, OA, HTN, GERD, R CTS, back pain, anemia    Diagnostic tests  02/09/19 L & R knee xray: No fracture or dislocation of the bilateral knees. Symmetric, mild bilateral medial and lateral compartment and moderate bilateral patellofemoral compartment osteoarthritis. Small, nonspecific bilateral knee joint effusions.    Patient Stated Goals  work on leg strength    Currently in Pain?  Yes    Pain Score  4     Pain Location  Knee    Pain Orientation  Right    Pain Descriptors / Indicators  Sore    Pain Type  Chronic pain    Pain Onset  More than a month ago    Pain Frequency  Intermittent    Aggravating Factors   prone quad stretch                       [facility] Adult PT Treatment/Exercise - 03/07/19 0001      Knee/Hip Exercises: Stretches   Passive Hamstring Stretch  Right;2  reps;30 seconds    Passive Hamstring Stretch Limitations  manual with therapist     Quad Stretch  Right;Left;2 reps;30 seconds    Quad Stretch Limitations  prone with strap    ITB Stretch  Right;2 reps;30 seconds    ITB Stretch Limitations  Manual with therapist     Piriformis Stretch  Right;2 reps;30 seconds    Piriformis Stretch Limitations  Manual with therapist       Knee/Hip Exercises: Aerobic   Nustep  L2 x 69min (LEs only)      Knee/Hip Exercises: Standing   Heel Raises  Both;10 reps    Heel Raises Limitations  coutner       Knee/Hip Exercises: Seated   Sit to Sand  1 set;5 reps;without UE support      Knee/Hip Exercises: Supine   Bridges with [employer]  Both;15 reps    Knee Flexion  Left;AAROM;10 reps   2 sets; 2nd set without strap    Knee Flexion Limitations  supine with strap and B LE resting peanut p-ball       Manual Therapy   Manual Therapy  Joint mobilization    Manual therapy comments  supine     Joint  Mobilization  B patellar mobs - min restricted all directions                PT Short Term Goals - 03/07/19 1353      PT SHORT TERM GOAL #1   Title  Patient to be independent with initial HEP.    Time  3    Period  Weeks    Status  Achieved    Target Date  03/07/19        PT Long Term Goals - 02/21/19 1427      PT LONG TERM GOAL #1   Title  Patient to be independent with advanced HEP.    Time  6    Period  Weeks    Status  On-going      PT LONG TERM GOAL #2   Title  Patient to demonstrate B hip strength >/=4+/5.    Time  6    Period  Weeks    Status  On-going      PT LONG TERM GOAL #3   Title  Patient to demonstrate stair climbing up/down 20 steps with 1 handrail as needed with <3/10 pain and good stability.    Time  6    Period  Weeks    Status  On-going      PT LONG TERM GOAL #4   Title  Patient to report 75% improvement in pain with STS transfers.    Time  6    Period  Weeks    Status  On-going            Plan - 03/07/19 1330    Clinical Impression Statement  pt. doing well today.  Denies issues with HEP.  Manual stretching for pt. ITB, HS, glute as tightness noted in these muscle groups on R and pt. with complaint of R lateral knee pain at ITB insertion.  Tolerated all proximal hip and LE strengthening activities well without increased pain.  Ended visit pain free and pt. declining modalities.    Comorbidities  SOB, osteopenia, OA, HTN, GERD, R CTS, back pain, anemia    Rehab Potential  Good    PT Treatment/Interventions  ADLs/Self Care Home Management;Cryotherapy;Electrical Stimulation;Moist Heat;Balance training;Therapeutic exercise;Therapeutic activities;Functional mobility training;Stair training;Gait training;Ultrasound;Neuromuscular re-education;Patient/family education;Manual techniques;Vasopneumatic  Device;Taping;Energy conservation;Dry needling;Passive range of motion    PT Next Visit Plan  progress LE strengthening and stretching     Consulted and Agree with Plan of Care  Patient       Patient will benefit from skilled therapeutic intervention in order to improve the following deficits and impairments:  Hypomobility, Increased edema, Decreased activity tolerance, Decreased strength, Increased fascial restricitons, Pain, Increased muscle spasms, Difficulty walking, Decreased balance, Decreased range of motion, Improper body mechanics, Postural dysfunction, Impaired flexibility  Visit Diagnosis: Chronic pain of right knee  Chronic pain of left knee  Difficulty in walking, not elsewhere classified     Problem List Patient Active Problem List   Diagnosis Date Noted  . 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 Class 1 obesity with serious comorbidity and body mass index (BMI) of 31.0 to 31.9 in adult 03/16/2018  . Muscle cramp, nocturnal 06/14/2017  . Preventative health care 04/05/2016  . Vitamin D deficiency 04/03/2016  . Cataracts, bilateral 07/25/2015  . Left shoulder pain 01/29/2015  . Breast cancer screening 09/02/2014  . Decreased visual acuity 09/02/2014  . Hearing loss 09/02/2014  . Left hip pain 09/02/2014  . Medicare annual wellness visit, subsequent 09/02/2014  . Osteopenia 08/17/2014  . Right knee pain 09/24/2013  . Low back pain 12/03/2012  . Skin lesion 07/10/2012  . Urinary incontinence 07/10/2012  . Cervical cancer screening 07/07/2012  . Cough 04/01/2012  . Hoarseness of voice 03/13/2012  . Cervical radiculopathy 12/03/2011  . GERD 09/13/2009  . BREAST MASS, LEFT 02/22/2009  . Pain in both knees 12/21/2008  . 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 Hyperlipidemia, mixed 10/28/2007  . INSOMNIA 10/28/2007  . Allergic rhinitis 07/01/2007  . WRIST PAIN, RIGHT 03/21/2007  . Muscle spasm of left lower extremity 03/21/2007  . Essential hypertension 08/06/2006  . 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

## 2019-03-07 NOTE — Telephone Encounter (Signed)
Pt dropped off document to be filled out by provider (Disability Parking Placard- 1 Page) Pt would like to be called at [phone] when document ready to pick up. Document put at front office tray under providers name.

## 2019-03-08 NOTE — Telephone Encounter (Signed)
Called the patient informed form done/at the front desk ready for pickup

## 2019-03-14 ENCOUNTER — Ambulatory Visit: Payer: Medicare Other | Attending: Family Medicine | Admitting: Physical Therapy

## 2019-03-14 ENCOUNTER — Other Ambulatory Visit: Payer: Self-pay

## 2019-03-14 ENCOUNTER — Encounter: Payer: Self-pay | Admitting: Physical Therapy

## 2019-03-14 DIAGNOSIS — M25562 Pain in left knee: Secondary | ICD-10-CM | POA: Diagnosis present

## 2019-03-14 DIAGNOSIS — R262 Difficulty in walking, not elsewhere classified: Secondary | ICD-10-CM | POA: Diagnosis present

## 2019-03-14 DIAGNOSIS — M25561 Pain in right knee: Secondary | ICD-10-CM | POA: Diagnosis present

## 2019-03-14 DIAGNOSIS — G8929 Other chronic pain: Secondary | ICD-10-CM

## 2019-03-14 NOTE — Therapy (Signed)
[facility] [address]  [place], [place], [address] Phone: [phone]   Fax:  [phone]  Physical Therapy Treatment  Patient Details  Name: Terri Mckee MRN: [id number] Date of Birth: 07-26-1940 Referring Provider (PT): Penni Homans, MD   Encounter Date: 03/14/2019  PT End of Session - 03/14/19 1352    Visit Number  5    Number of Visits  7    Date for PT Re-Evaluation  03/28/19    Authorization Type  [employer]    PT Start Time  1307    PT Stop Time  1351    PT Time Calculation (min)  44 min    Activity Tolerance  Patient tolerated treatment well;Patient limited by pain    Behavior During Therapy  [facility] for tasks assessed/performed       Past Medical History:  Diagnosis Date  . 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 Acute upper respiratory infection 04/09/2014  . Anemia   . Arthritis    back  . Back pain   . 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 Cataract   . Cataracts, bilateral 07/25/2015  . Cervical cancer screening 07/07/2012  . Colon polyps   . Elevated serum creatinine    with ACE inhibitor (Normal MRA of renal arteries)  . GERD (gastroesophageal reflux disease)   . History of cyst of breast   . Hoarseness of voice 03/13/2012  . Hypertension   . Lactose intolerance   . Medicare annual wellness visit, subsequent 09/02/2014  . Muscle spasm of left lower extremity 03/21/2007   Qualifier: Diagnosis of  By: Wynona Luna   . Osteoarthritis   . Osteopenia 08/17/2014  . Preventative health care 04/05/2016  . Shortness of breath   . Skin lesion 07/10/2012  . Urinary incontinence 07/10/2012  . Vitamin D deficiency 04/03/2016    Past Surgical History:  Procedure Laterality Date  . APPENDECTOMY    . BREAST CYST EXCISION Bilateral    right breast, twice  . OVARY SURGERY     right ovary & tube replacement  . WRIST GANGLION EXCISION      rt    There were no vitals filed for this visit.  Subjective Assessment - 03/14/19 1308    Subjective  Doing alright.    Pertinent History  SOB, osteopenia, OA, HTN, GERD, R CTS, back pain, anemia    Diagnostic tests  02/09/19 L & R knee xray: No fracture or dislocation of the bilateral knees. Symmetric, mild bilateral medial and lateral compartment and moderate bilateral patellofemoral compartment osteoarthritis. Small, nonspecific bilateral knee joint effusions.    Patient Stated Goals  work on leg strength    Pain Score  3     Pain Location  Knee    Pain Orientation  Right    Pain Descriptors / Indicators  Sore    Pain Type  Chronic pain                       [facility] Adult PT Treatment/Exercise - 03/14/19 0001      Knee/Hip Exercises: Stretches   Passive Hamstring Stretch  Right;30 seconds;Left;2 reps    Passive Hamstring Stretch Limitations  supine with strap    Piriformis Stretch  Right;Left;1 rep;30 seconds    Piriformis Stretch Limitations  figure 4; cues for set up in order  to acheive stretch      Knee/Hip Exercises: Aerobic   Nustep  L3 x 48min (LEs only)      Knee/Hip Exercises: Standing   Hip Flexion  Stengthening;Right;Left;1 set;10 reps;Knee straight    Hip Flexion Limitations  2 ski poles & yellow TB on R LE, no TB on L LE    Terminal Knee Extension  Strengthening;Right;Left;1 set;10 reps;Theraband    Theraband Level (Terminal Knee Extension)  Level 4 (Blue)    Terminal Knee Extension Limitations  10x3" each LE with chair support    Hip ADduction  Strengthening;Right;Left;1 set;10 reps    Hip ADduction Limitations  2 ski poles & yellow TB on R LE, no TB on L LE    Hip Abduction  Stengthening;Right;Left;1 set;10 reps;Knee straight    Abduction Limitations  2 ski poles & yellow TB on R LE, no TB on L LE    Hip Extension  Stengthening;Right;Left;1 set;10 reps;Knee straight    Extension Limitations  2 ski poles & yellow TB on R LE, no TB on L LE     Wall Squat  1 set;10 reps    Wall Squat Limitations  mini squats at counter   cues to bring bottom back and maintain ches tupright     Kinesiotix   Create Space  B chondromalacia patellae pattern with 50% stretch on long strips and 80% stretch on short strip               PT Short Term Goals - 03/07/19 1353      PT SHORT TERM GOAL #1   Title  Patient to be independent with initial HEP.    Time  3    Period  Weeks    Status  Achieved    Target Date  03/07/19        PT Long Term Goals - 02/21/19 1427      PT LONG TERM GOAL #1   Title  Patient to be independent with advanced HEP.    Time  6    Period  Weeks    Status  On-going      PT LONG TERM GOAL #2   Title  Patient to demonstrate B hip strength >/=4+/5.    Time  6    Period  Weeks    Status  On-going      PT LONG TERM GOAL #3   Title  Patient to demonstrate stair climbing up/down 20 steps with 1 handrail as needed with <3/10 pain and good stability.    Time  6    Period  Weeks    Status  On-going      PT LONG TERM GOAL #4   Title  Patient to report 75% improvement in pain with STS transfers.    Time  6    Period  Weeks    Status  On-going            Plan - 03/14/19 1352    Clinical Impression Statement  Patient without new complaints today. Initiated standing LE strengthening ther-ex today. Patient limited by R knee pain with mini squats and required consistent cueing to maintain proper form. Demonstrated difficulty achieving full extension with TKE on R LE. Required intermittent sitting rest breaks d/t R knee pain today, possibly d/t increased challenge with ther-ex. Demonstrated fatigue with 4 way hip- omitted resistance on L LE in order to avoid excessive challenge on painful R knee. Patient also with tendency to perform anterior trunk lean  with standing ther-ex, requiring cueing to correct. Worked on gentle LE stretching for remainder of session. Ended session with KT tape to R knee for pain relief.  Patient without further complaints at end of session.    Comorbidities  SOB, osteopenia, OA, HTN, GERD, R CTS, back pain, anemia    Rehab Potential  Good    PT Treatment/Interventions  ADLs/Self Care Home Management;Cryotherapy;Electrical Stimulation;Moist Heat;Balance training;Therapeutic exercise;Therapeutic activities;Functional mobility training;Stair training;Gait training;Ultrasound;Neuromuscular re-education;Patient/family education;Manual techniques;Vasopneumatic Device;Taping;Energy conservation;Dry needling;Passive range of motion    PT Next Visit Plan  progress LE strengthening and stretching    Consulted and Agree with Plan of Care  Patient       Patient will benefit from skilled therapeutic intervention in order to improve the following deficits and impairments:  Hypomobility, Increased edema, Decreased activity tolerance, Decreased strength, Increased fascial restricitons, Pain, Increased muscle spasms, Difficulty walking, Decreased balance, Decreased range of motion, Improper body mechanics, Postural dysfunction, Impaired flexibility  Visit Diagnosis: Chronic pain of right knee  Chronic pain of left knee  Difficulty in walking, not elsewhere classified     Problem List Patient Active Problem List   Diagnosis Date Noted  . 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 Class 1 obesity with serious comorbidity and body mass index (BMI) of 31.0 to 31.9 in adult 03/16/2018  . Muscle cramp, nocturnal 06/14/2017  . Preventative health care 04/05/2016  . Vitamin D deficiency 04/03/2016  . Cataracts, bilateral 07/25/2015  . Left shoulder pain 01/29/2015  . Breast cancer screening 09/02/2014  . Decreased visual acuity 09/02/2014  . Hearing loss 09/02/2014  . Left hip pain 09/02/2014  . Medicare annual wellness visit, subsequent 09/02/2014  . Osteopenia 08/17/2014  . Right knee pain 09/24/2013  . Low back pain 12/03/2012  . Skin lesion 07/10/2012  . Urinary incontinence 07/10/2012  .  Cervical cancer screening 07/07/2012  . Cough 04/01/2012  . Hoarseness of voice 03/13/2012  . Cervical radiculopathy 12/03/2011  . GERD 09/13/2009  . BREAST MASS, LEFT 02/22/2009  . Pain in both knees 12/21/2008  . 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 Hyperlipidemia, mixed 10/28/2007  . INSOMNIA 10/28/2007  . Allergic rhinitis 07/01/2007  . WRIST PAIN, RIGHT 03/21/2007  . Muscle spasm of left lower extremity 03/21/2007  . Essential hypertension 08/06/2006  . HORSESHOE KIDNEY 08/06/2006     Janene Harvey, PT, DPT 03/14/19 1:54 PM   [facility] [address]  [place], [place], [address] Phone: [phone]   Fax:  [phone]  Name: GIANNY CHOAT MRN: [id number] Date of Birth: April 23, 1940

## 2019-03-20 ENCOUNTER — Other Ambulatory Visit: Payer: Self-pay

## 2019-03-20 ENCOUNTER — Other Ambulatory Visit: Payer: Self-pay | Admitting: Family Medicine

## 2019-03-20 ENCOUNTER — Ambulatory Visit: Payer: Medicare Other

## 2019-03-20 ENCOUNTER — Ambulatory Visit
Admission: RE | Admit: 2019-03-20 | Discharge: 2019-03-20 | Disposition: A | Discharge status: Home / Self Care | Payer: Medicare Other | Source: Ambulatory Visit | Attending: Family Medicine | Admitting: Family Medicine

## 2019-03-20 DIAGNOSIS — L988 Other specified disorders of the skin and subcutaneous tissue: Secondary | ICD-10-CM

## 2019-03-20 DIAGNOSIS — N649 Disorder of breast, unspecified: Secondary | ICD-10-CM

## 2019-03-21 ENCOUNTER — Encounter: Payer: Medicare Other | Admitting: Physical Therapy

## 2019-03-28 ENCOUNTER — Ambulatory Visit: Payer: Medicare Other

## 2019-03-28 ENCOUNTER — Other Ambulatory Visit: Payer: Self-pay

## 2019-03-28 DIAGNOSIS — M25561 Pain in right knee: Secondary | ICD-10-CM

## 2019-03-28 DIAGNOSIS — G8929 Other chronic pain: Secondary | ICD-10-CM

## 2019-03-28 DIAGNOSIS — M25562 Pain in left knee: Secondary | ICD-10-CM

## 2019-03-28 DIAGNOSIS — R262 Difficulty in walking, not elsewhere classified: Secondary | ICD-10-CM

## 2019-03-28 NOTE — Therapy (Addendum)
[facility] [address]  [place], [place], [address] Phone: [phone]   Fax:  [phone]  Physical Therapy Treatment  Patient Details  Name: Terri Mckee MRN: [id number] Date of Birth: October 21, 1940 Referring Provider (PT): Penni Homans, MD   Progress Note Reporting Period 02/14/19 to 03/28/19  See note below for Objective Data and Assessment of Progress/Goals.     Encounter Date: 03/28/2019  PT End of Session - 03/28/19 1610    Visit Number  6    Number of Visits  7    Date for PT Re-Evaluation  03/28/19    Authorization Type  [employer]    PT Start Time  1405    PT Stop Time  1450    PT Time Calculation (min)  45 min    Activity Tolerance  Patient tolerated treatment well;Patient limited by pain    Behavior During Therapy  [facility] for tasks assessed/performed       Past Medical History:  Diagnosis Date  . 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 Acute upper respiratory infection 04/09/2014  . Anemia   . Arthritis    back  . Back pain   . 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 Cataract   . Cataracts, bilateral 07/25/2015  . Cervical cancer screening 07/07/2012  . Colon polyps   . Elevated serum creatinine    with ACE inhibitor (Normal MRA of renal arteries)  . GERD (gastroesophageal reflux disease)   . History of cyst of breast   . Hoarseness of voice 03/13/2012  . Hypertension   . Lactose intolerance   . Medicare annual wellness visit, subsequent 09/02/2014  . Muscle spasm of left lower extremity 03/21/2007   Qualifier: Diagnosis of  By: Wynona Luna   . Osteoarthritis   . Osteopenia 08/17/2014  . Preventative health care 04/05/2016  . Shortness of breath   . Skin lesion 07/10/2012  . Urinary incontinence 07/10/2012  . Vitamin D deficiency 04/03/2016    Past Surgical History:  Procedure Laterality Date  . APPENDECTOMY    .  BREAST CYST EXCISION Bilateral    right breast, twice  . OVARY SURGERY     right ovary & tube replacement  . WRIST GANGLION EXCISION     rt    There were no vitals filed for this visit.  Subjective Assessment - 03/28/19 1413    Subjective  Pt reports her knee has been feeling "alright, pretty good" since last session. Stairs continue to be most bothersome. Pt wants to discontinue therapy for awhile due to expense of copay and husband's doctor visits.    Pertinent History  SOB, osteopenia, OA, HTN, GERD, R CTS, back pain, anemia    Diagnostic tests  02/09/19 L & R knee xray: No fracture or dislocation of the bilateral knees. Symmetric, mild bilateral medial and lateral compartment and moderate bilateral patellofemoral compartment osteoarthritis. Small, nonspecific bilateral knee joint effusions.    Patient Stated Goals  work on leg strength    Currently in Pain?  Yes    Pain Score  5     Pain Location  Knee    Pain Orientation  Right    Pain Descriptors / Indicators  Aching    Pain Type  Chronic pain  [facility] Adult PT Treatment/Exercise - 03/28/19 0001      Knee/Hip Exercises: Aerobic   Nustep  L3 x 6 mins (LEs only)      Knee/Hip Exercises: Standing   Forward Step Up  Right;1 set;5 reps;Hand Hold: 2;Step Height: 6";Other (comment)   at stairs holding R rail with both hands   Forward Step Up Limitations  weakness in R posterior chain, PT lightly approximating R knee to prevent anterior translation over ankle    Other Standing Knee Exercises  Stairs training 10 steps x 2 with reciprocal pattern to ascend and mixture of non/reciprocal pattern to descending using 1 hand rail      Knee/Hip Exercises: Seated   Sit to Sand  2 sets;10 reps;without UE support;Other (comment)   6# DB reach fwd 1st set, YTB above knees 2nd set eccentric      Manual Therapy   Manual Therapy  Soft tissue mobilization;Taping    Manual therapy comments  TTP lateral patellar  tendon    Soft tissue mobilization  XFM to lateral patellar tendon in shortened and lengthened positions      Kinesiotix   Create Space  B chondromalacia patellae pattern with 50% stretch on vertical strips and 80% stretch over horizontal strap across patellar tendon             PT Education - 03/28/19 1610    Education Details  Pt educated to use both hands on rail at home for any attempt of forward step-ups with R LE for strength    Person(s) Educated  Patient    Methods  Explanation;Demonstration;Verbal cues;Tactile cues    Comprehension  Verbalized understanding;Returned demonstration       PT Short Term Goals - 03/07/19 1353      PT SHORT TERM GOAL #1   Title  Patient to be independent with initial HEP.    Time  3    Period  Weeks    Status  Achieved    Target Date  03/07/19        PT Long Term Goals - 02/21/19 1427      PT LONG TERM GOAL #1   Title  Patient to be independent with advanced HEP.    Time  6    Period  Weeks    Status  On-going      PT LONG TERM GOAL #2   Title  Patient to demonstrate B hip strength >/=4+/5.    Time  6    Period  Weeks    Status  On-going      PT LONG TERM GOAL #3   Title  Patient to demonstrate stair climbing up/down 20 steps with 1 handrail as needed with <3/10 pain and good stability.    Time  6    Period  Weeks    Status  On-going      PT LONG TERM GOAL #4   Title  Patient to report 75% improvement in pain with STS transfers.    Time  6    Period  Weeks    Status  On-going            Plan - 03/28/19 1611    Clinical Impression Statement  Pt has no new c/o pain today, but reports wanting to take a break from PT due to husband's multiple doctor's appointments and co-pays adding up. Pt feels his health issues are more important to address at this time and requests 30 day hold. She responded well to [facility]  of lateral patellar tendon with tendon lengthened and shortened. She was able to perform pain free sit <> stands  with 6# DB reaches followed by yellow theraband above knees with verbal cues to engage hip abductors/external rotators. Pt continues to be limited with stairs descending greater than ascending, but was able to perform 6" step ups pain free in stairwell with B UE holding onto R HR for assist to step up. Ended session with KT tape to R for pain relief with pt reporting instant relief in standing.    Comorbidities  SOB, osteopenia, OA, HTN, GERD, R CTS, back pain, anemia    Rehab Potential  Good    PT Treatment/Interventions  ADLs/Self Care Home Management;Cryotherapy;Electrical Stimulation;Moist Heat;Balance training;Therapeutic exercise;Therapeutic activities;Functional mobility training;Stair training;Gait training;Ultrasound;Neuromuscular re-education;Patient/family education;Manual techniques;Vasopneumatic Device;Taping;Energy conservation;Dry needling;Passive range of motion    PT Next Visit Plan  pt is going on hold for 30 days    Consulted and Agree with Plan of Care  Patient       Patient will benefit from skilled therapeutic intervention in order to improve the following deficits and impairments:  Hypomobility, Increased edema, Decreased activity tolerance, Decreased strength, Increased fascial restricitons, Pain, Increased muscle spasms, Difficulty walking, Decreased balance, Decreased range of motion, Improper body mechanics, Postural dysfunction, Impaired flexibility  Visit Diagnosis: Chronic pain of left knee  Chronic pain of right knee  Difficulty in walking, not elsewhere classified     Problem List Patient Active Problem List   Diagnosis Date Noted  . 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 Class 1 obesity with serious comorbidity and body mass index (BMI) of 31.0 to 31.9 in adult 03/16/2018  . Muscle cramp, nocturnal 06/14/2017  . Preventative health care 04/05/2016  . Vitamin D deficiency 04/03/2016  . Cataracts, bilateral 07/25/2015  . Left shoulder pain 01/29/2015  . Breast  cancer screening 09/02/2014  . Decreased visual acuity 09/02/2014  . Hearing loss 09/02/2014  . Left hip pain 09/02/2014  . Medicare annual wellness visit, subsequent 09/02/2014  . Osteopenia 08/17/2014  . Right knee pain 09/24/2013  . Low back pain 12/03/2012  . Skin lesion 07/10/2012  . Urinary incontinence 07/10/2012  . Cervical cancer screening 07/07/2012  . Cough 04/01/2012  . Hoarseness of voice 03/13/2012  . Cervical radiculopathy 12/03/2011  . GERD 09/13/2009  . BREAST MASS, LEFT 02/22/2009  . Pain in both knees 12/21/2008  . 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 Hyperlipidemia, mixed 10/28/2007  . INSOMNIA 10/28/2007  . Allergic rhinitis 07/01/2007  . WRIST PAIN, RIGHT 03/21/2007  . Muscle spasm of left lower extremity 03/21/2007  . Essential hypertension 08/06/2006  . 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  Patient goals were not met. Patient is being discharged due to the patient's request.  ?????     Janene Harvey, PT, DPT 05/25/19 8:52 AM

## 2019-05-23 ENCOUNTER — Other Ambulatory Visit: Payer: Self-pay | Admitting: Family Medicine

## 2019-05-23 DIAGNOSIS — J441 Chronic obstructive pulmonary disease with (acute) exacerbation: Secondary | ICD-10-CM

## 2019-05-23 MED ORDER — ALBUTEROL SULFATE HFA 108 (90 BASE) MCG/ACT IN AERS: 2.0000 | INHALATION_SPRAY | Freq: Four times a day (QID) | RESPIRATORY_TRACT | 5 refills | Status: DC | PRN

## 2019-06-19 ENCOUNTER — Other Ambulatory Visit: Payer: Self-pay | Admitting: Family Medicine

## 2019-06-19 DIAGNOSIS — J441 Chronic obstructive pulmonary disease with (acute) exacerbation: Secondary | ICD-10-CM

## 2019-08-10 ENCOUNTER — Ambulatory Visit: Payer: Medicare Other | Admitting: Family Medicine

## 2019-08-14 ENCOUNTER — Ambulatory Visit (HOSPITAL_BASED_OUTPATIENT_CLINIC_OR_DEPARTMENT_OTHER)
Admission: RE | Admit: 2019-08-14 | Discharge: 2019-08-14 | Disposition: A | Discharge status: Home / Self Care | Payer: Medicare Other | Source: Ambulatory Visit | Attending: Medical | Admitting: Medical

## 2019-08-14 ENCOUNTER — Ambulatory Visit: Payer: Medicare Other | Admitting: Medical

## 2019-08-14 ENCOUNTER — Other Ambulatory Visit: Payer: Self-pay

## 2019-08-14 ENCOUNTER — Ambulatory Visit: Payer: Medicare Other | Admitting: Family Medicine

## 2019-08-14 VITALS — BP 125/69 | HR 75 | Resp 18 | Ht 69.0 in | Wt 229.8 lb

## 2019-08-14 DIAGNOSIS — R05 Cough: Secondary | ICD-10-CM

## 2019-08-14 DIAGNOSIS — I10 Essential (primary) hypertension: Secondary | ICD-10-CM

## 2019-08-14 DIAGNOSIS — R059 Cough, unspecified: Secondary | ICD-10-CM

## 2019-08-14 DIAGNOSIS — J441 Chronic obstructive pulmonary disease with (acute) exacerbation: Secondary | ICD-10-CM

## 2019-08-14 DIAGNOSIS — R944 Abnormal results of kidney function studies: Secondary | ICD-10-CM | POA: Diagnosis not present

## 2019-08-14 LAB — COMPREHENSIVE METABOLIC PANEL
ALT: 11 U/L (ref 0–35)
AST: 12 U/L (ref 0–37)
Albumin: 3.9 g/dL (ref 3.5–5.2)
Alkaline Phosphatase: 56 U/L (ref 39–117)
BUN: 16 mg/dL (ref 6–23)
CO2: 28 mEq/L (ref 19–32)
Calcium: 9.2 mg/dL (ref 8.4–10.5)
Chloride: 106 mEq/L (ref 96–112)
Creatinine, Ser: 1.09 mg/dL (ref 0.40–1.20)
GFR: 58.51 mL/min — ABNORMAL LOW (ref >60.00)
Glucose, Bld: 93 mg/dL (ref 70–99)
Potassium: 4.3 mEq/L (ref 3.5–5.1)
Sodium: 140 mEq/L (ref 135–145)
Total Bilirubin: 0.4 mg/dL (ref 0.2–1.2)
Total Protein: 6.9 g/dL (ref 6.0–8.3)

## 2019-08-14 MED ORDER — ALBUTEROL SULFATE HFA 108 (90 BASE) MCG/ACT IN AERS: 2.0000 | INHALATION_SPRAY | Freq: Four times a day (QID) | RESPIRATORY_TRACT | 5 refills | Status: DC | PRN

## 2019-08-14 NOTE — Patient Instructions (Addendum)
You have hx of copd with some limited wheezing at night recently. You are not taking symbicort daily so I want you to start taking daily. Also refilling your albuterol inhaler. Counseled on how to take symbicort daily.  Will get cxr to see if any pneumonia. Will decide on antibiotic after xray review.  For htn and decrased gfr will get cmp.  Follow up 2 weeks or as needed.

## 2019-08-14 NOTE — Progress Notes (Signed)
Subjective:    Patient ID: Terri Mckee, female    DOB: 27-Aug-1940, 80 y.o.   MRN: [id number]  HPI  Pt in for refill of albuterol She states has symbicort.   Pt states has about 2 sprays left in her albuterol inahler. She uses albuterol 2 puffs every 2-4 weeks. Hx of copd. Pt used to smoke. She stopped 25 years ago.  Pt has gotten covid vaccine. Got both vaccines.  Pt o2 sat is 99%.   Pt states admits to only using symbicort on as needed basis. Last 2 weeks used it only 4-5 times. Not using daily. Some recent wheezing at night.   No fever, no chills or sweats. Some productive cough rare.   Pt bp well controlled.      Review of Systems  Constitutional: Negative for chills, fatigue and fever.  HENT: Negative for congestion, ear discharge, postnasal drip, rhinorrhea, sinus pressure and sinus pain.   Respiratory: Positive for cough and wheezing. Negative for chest tightness and shortness of breath.        See hpi.  Cardiovascular: Negative for chest pain and palpitations.  Gastrointestinal: Negative for abdominal pain.  Musculoskeletal: Negative for back pain.  Skin: Negative for rash.  Neurological: Negative for dizziness and light-headedness.  Hematological: Negative for adenopathy.  Psychiatric/Behavioral: Negative for behavioral problems and confusion.    Past Medical History:  Diagnosis Date  . 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 Acute upper respiratory infection 04/09/2014  . Anemia   . Arthritis    back  . Back pain   . 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 Cataract   . Cataracts, bilateral 07/25/2015  . Cervical cancer screening 07/07/2012  . Colon polyps   . Elevated serum creatinine    with ACE inhibitor (Normal MRA of renal arteries)  . GERD (gastroesophageal reflux disease)   . History of cyst of breast   . Hoarseness of voice 03/13/2012  . Hypertension   . Lactose intolerance   .  Medicare annual wellness visit, subsequent 09/02/2014  . Muscle spasm of left lower extremity 03/21/2007   Qualifier: Diagnosis of  By: Wynona Luna   . Osteoarthritis   . Osteopenia 08/17/2014  . Preventative health care 04/05/2016  . Shortness of breath   . Skin lesion 07/10/2012  . Urinary incontinence 07/10/2012  . Vitamin D deficiency 04/03/2016     Social History   Socioeconomic History  . Marital status: Married    Spouse name: Shanon Brow  . Number of children: Not on file  . Years of education: Not on file  . Highest education level: Not on file  Occupational History  . Occupation: retired  Tobacco Use  . Smoking status: Former Smoker    Quit date: 01/13/1991    Years since quitting: 28.6  . Smokeless tobacco: Never Used  . Tobacco comment: quit 20 years ago 1/2 ppd x 40 years  Vaping Use  . Vaping Use: Never used  Substance and Sexual Activity  . Alcohol use: No  . Drug use: No  . Sexual activity: Never    Comment: lives with husband, no dietary restrictions,   Other Topics Concern  . Not on file  Social History Narrative   Former Smoker - quit 20 years ago (1/2 ppd x 40 years)   Widowed   1 Daughter        Social Determinants  of Health   Financial Resource Strain:   . Difficulty of Paying Living Expenses:   Food Insecurity:   . Worried About Charity fundraiser in the Last Year:   . Arboriculturist in the Last Year:   Transportation Needs:   . Film/video editor (Medical):   Marland Kitchen Lack of Transportation (Non-Medical):   Physical Activity:   . Days of Exercise per Week:   . Minutes of Exercise per Session:   Stress:   . Feeling of Stress :   Social Connections:   . Frequency of Communication with Friends and Family:   . Frequency of Social Gatherings with Friends and Family:   . Attends Religious Services:   . Active Member of Clubs or Organizations:   . Attends Archivist Meetings:   Marland Kitchen Marital Status:   Intimate Partner Violence:   . Fear of  Current or Ex-Partner:   . Emotionally Abused:   Marland Kitchen Physically Abused:   . Sexually Abused:     Past Surgical History:  Procedure Laterality Date  . APPENDECTOMY    . BREAST CYST EXCISION Bilateral    right breast, twice  . OVARY SURGERY     right ovary & tube replacement  . WRIST GANGLION EXCISION     rt    Family History  Problem Relation Age of Onset  . Brain cancer Mother        deceased age 31  . Alcohol abuse Mother   . Obesity Mother   . Other Brother        died of sepsis  . Gout Brother   . Alcohol abuse Father   . Cancer Sister        throat  . Pulmonary embolism Daughter   . Mental illness Daughter        anxiety  . Diabetes Maternal Aunt   . Diabetes Maternal Aunt   . Seizures Maternal Aunt   . Birth defects Maternal Aunt   . Lupus Grandchild   . Sickle cell anemia Grandchild   . Colon cancer Neg Hx   . Breast cancer Neg Hx     Allergies  Allergen Reactions  . Allegra [Fexofenadine] Shortness Of Breath  . Erythromycin Nausea Only  . Oxycodone-Acetaminophen Nausea And Vomiting  . Propoxyphene N-Acetaminophen Nausea And Vomiting      severe vomiting  . Zocor [Simvastatin] Other (See Comments)      Myalgia    Current Outpatient Medications on File Prior to Visit  Medication Sig Dispense Refill  . acetaminophen-codeine (TYLENOL #3) 300-30 MG tablet Take 1-2 tablets by mouth every 4 (four) hours as needed for moderate pain or severe pain. (Patient not taking: Reported on 08/14/2019) 40 tablet 0  . albuterol (VENTOLIN HFA) 108 (90 Base) MCG/ACT inhaler Inhale 2 puffs into the lungs every 6 (six) hours as needed for wheezing or shortness of breath. 18 g 5  . aspirin 81 MG tablet Take 81 mg by mouth daily.      . calcium carbonate (OS-CAL) 600 MG TABS Take 600 mg by mouth daily.      Marland Kitchen dicyclomine (BENTYL) 20 MG tablet Take 1 tablet (20 mg total) by mouth 3 (three) times daily as needed for spasms. (Patient not taking: Reported on 08/14/2019) 20 tablet 0  .  famotidine (PEPCID) 20 MG tablet TAKE 1 TABLET(20 MG) BY MOUTH TWICE DAILY 180 tablet 1  . Garlic 825 MG CAPS Take 1 capsule by mouth daily.    Marland Kitchen  Krill Oil 1000 MG CAPS Take 1,000 capsules by mouth daily.    . Multiple Vitamin (MULTIVITAMIN) tablet Take 1 tablet by mouth daily.    . NORVASC 10 MG tablet Take 1 tablet (10 mg total) by mouth daily. 90 tablet 3  . promethazine-dextromethorphan (PROMETHAZINE-DM) 6.25-15 MG/5ML syrup Take 5 mLs by mouth 4 (four) times daily as needed. (Patient not taking: Reported on 08/14/2019) 118 mL 0  . SYMBICORT 80-4.5 MCG/ACT inhaler INHALE 2 PUFFS INTO THE LUNGS TWICE DAILY 10.2 g 2  . XYZAL 5 MG tablet Take 1 tablet (5 mg total) by mouth every evening. 90 tablet 3   No current facility-administered medications on file prior to visit.    BP 125/69   Pulse 75   Resp 18   Ht 5\' 9"  (1.753 m)   Wt (!) 229 lb 12.8 oz (104.2 kg)   LMP  (LMP Unknown)   SpO2 99%   BMI 33.94 kg/m       Objective:   Physical Exam  General- No acute distress. Pleasant patient. Neck- Full range of motion, no jvd Lungs- Clear, even and unlabored. Heart- regular rate and rhythm. Neurologic- CNII- XII grossly intact. Lower ext- no pedal edema. Negative homans signs.       Assessment & Plan:  You have hx of copd with some limites wheezing at night recently. You are not taking symbicort daily so I want you to start taking daily. Also refilling your albuterol inhaler. Counseled on how to take symbicort daily.  Will get cxr to see if any pneumonia. Will decide on antibiotic after xray review.  For htn and decrased gfr will get cmp.  Follow up 2 weeks or as needed.  Mackie Pai, PA-C   Time spent with patient today was 20  minutes which consisted of chart review, discussing diagnosis, work up, treatment and documentation.

## 2019-09-08 ENCOUNTER — Other Ambulatory Visit: Payer: Self-pay | Admitting: Family Medicine

## 2019-09-15 ENCOUNTER — Telehealth: Payer: Self-pay | Admitting: Family Medicine

## 2019-09-15 NOTE — Telephone Encounter (Signed)
We did offer other BP medications, but patient has declined.  Pt. Requesting for your input, for prescribing other options for her BP medications. Please advise.  Thank you kindly.  [place]

## 2019-09-15 NOTE — Telephone Encounter (Signed)
Patient states she cannot afford Norvasc, pharmacy says insurance will not pay for it and cost is $900. Please advise.

## 2019-09-15 NOTE — Telephone Encounter (Signed)
How is that possible Amlodipine has been around forever. What is her insurance offering instead. Is it that they thought she needed the brand name? The Generic is OK can we check on this again?

## 2019-09-20 NOTE — Telephone Encounter (Signed)
Am willing to try Felodipine ER 10 mg daily disp #30 with 3 rf if patient agrees then bring her in for BP and pulse check in 2-4 weeks.

## 2019-09-20 NOTE — Telephone Encounter (Signed)
[facility] [phone]) and they gave a few other options:  Felodipine ER 10mg  ($18.00 90day) Nisoldipine ER 34mg  ($79.57) Nifedipine ER 60mg  ($37.00) Nifedipine Osmotic 24hr tab 60mg  ($37.00)

## 2019-09-21 MED ORDER — FELODIPINE ER 10 MG PO TB24: 10.0000 mg | ORAL_TABLET | Freq: Every day | ORAL | 3 refills | Status: DC

## 2019-09-21 NOTE — Telephone Encounter (Signed)
Patient will change and scheduled nurse visit for 9/28 for bp check.  Medication sent in.

## 2019-10-10 ENCOUNTER — Other Ambulatory Visit: Payer: Self-pay

## 2019-10-10 ENCOUNTER — Ambulatory Visit (INDEPENDENT_AMBULATORY_CARE_PROVIDER_SITE_OTHER): Payer: Medicare Other | Admitting: Family Medicine

## 2019-10-10 VITALS — BP 122/72 | HR 54

## 2019-10-10 DIAGNOSIS — I10 Essential (primary) hypertension: Secondary | ICD-10-CM

## 2019-10-10 NOTE — Progress Notes (Addendum)
Pt here for Blood pressure check per Dr. Charlett Blake  Pt currently takes:Felodipine ER 10 mg daily    Pt reports compliance with medication. She reports no side effects.     BP Readings from Last 3 Encounters:  08/14/19 125/69  02/09/19 126/80  08/09/18 124/82    BP today @ = 122/72  HR = 54   Please advise if any changes are necessary. I will call patient with instructions.  Nursing note reviewed. Agree with documention and plan.

## 2019-10-25 ENCOUNTER — Telehealth: Payer: Self-pay | Admitting: Family Medicine

## 2019-10-25 NOTE — Telephone Encounter (Signed)
Per Pharmacy, patient insurance no longer cover Norvasc(brand name) patient would like to get the generic. Please Advise if ok.

## 2019-10-26 NOTE — Telephone Encounter (Signed)
Yes ok to switch to generic Norvasc, ie Amlodipine at same dose

## 2019-10-26 NOTE — Telephone Encounter (Signed)
[facility] is calling checking the status of order

## 2019-10-27 ENCOUNTER — Other Ambulatory Visit: Payer: Self-pay

## 2019-10-27 DIAGNOSIS — I10 Essential (primary) hypertension: Secondary | ICD-10-CM

## 2019-10-27 MED ORDER — AMLODIPINE BESYLATE 10 MG PO TABS: 10.0000 mg | ORAL_TABLET | Freq: Every day | ORAL | 3 refills | Status: DC

## 2019-10-27 NOTE — Telephone Encounter (Signed)
Rx modification sent back to [facility] for adjustment.

## 2019-10-30 ENCOUNTER — Other Ambulatory Visit: Payer: Self-pay

## 2019-10-30 DIAGNOSIS — I10 Essential (primary) hypertension: Secondary | ICD-10-CM

## 2019-10-30 MED ORDER — AMLODIPINE BESYLATE 10 MG PO TABS: 10.0000 mg | ORAL_TABLET | Freq: Every day | ORAL | 3 refills | Status: DC

## 2019-11-14 ENCOUNTER — Encounter: Payer: Self-pay | Admitting: Family Medicine

## 2019-11-14 ENCOUNTER — Other Ambulatory Visit: Payer: Self-pay

## 2019-11-14 ENCOUNTER — Ambulatory Visit: Payer: Medicare Other | Admitting: Family Medicine

## 2019-11-14 DIAGNOSIS — E559 Vitamin D deficiency, unspecified: Secondary | ICD-10-CM | POA: Diagnosis not present

## 2019-11-14 DIAGNOSIS — E8881 Metabolic syndrome: Secondary | ICD-10-CM | POA: Diagnosis not present

## 2019-11-14 DIAGNOSIS — E782 Mixed hyperlipidemia: Secondary | ICD-10-CM | POA: Diagnosis not present

## 2019-11-14 DIAGNOSIS — I10 Essential (primary) hypertension: Secondary | ICD-10-CM

## 2019-11-14 DIAGNOSIS — M545 Low back pain, unspecified: Secondary | ICD-10-CM

## 2019-11-14 MED ORDER — FELODIPINE ER 10 MG PO TB24: 10.0000 mg | ORAL_TABLET | Freq: Every day | ORAL | 1 refills | Status: DC

## 2019-11-14 MED ORDER — ACETAMINOPHEN-CODEINE #3 300-30 MG PO TABS: 1.0000 | ORAL_TABLET | ORAL | 0 refills | Status: DC | PRN

## 2019-11-14 NOTE — Assessment & Plan Note (Signed)
Encouraged moist heat and gentle stretching as tolerated. May try NSAIDs and prescription meds as directed and report if symptoms worsen or seek immediate care. Has used T and C #3 in past is given a refill and a contract and she is asked to minimize use and to use plain tylenol prn

## 2019-11-14 NOTE — Progress Notes (Signed)
Subjective:    Patient ID: Terri Mckee, female    DOB: 11/09/1940, 79 y.o.   MRN: [id number]  Chief Complaint  Patient presents with  . Follow-up    Pt states that she needs pain medication for her back  . Hypertension    HPI Patient is in today for follow up on chronic medical concerns. No recent febrile illness or hospitalizations. She reports her blood pressure is better on the Felodipine. Her back pain continues to be an issue but is not worsened. No recent fall or trauma. She is trying to stay active and maintain a heart healthy diet. Denies CP/palp/SOB/HA/congestion/fevers/GI or GU c/o. Taking meds as prescribed  Past Medical History:  Diagnosis Date  . 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 Acute upper respiratory infection 04/09/2014  . Anemia   . Arthritis    back  . Back pain   . 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 Cataract   . Cataracts, bilateral 07/25/2015  . Cervical cancer screening 07/07/2012  . Colon polyps   . Elevated serum creatinine    with ACE inhibitor (Normal MRA of renal arteries)  . GERD (gastroesophageal reflux disease)   . History of cyst of breast   . Hoarseness of voice 03/13/2012  . Hypertension   . Lactose intolerance   . Medicare annual wellness visit, subsequent 09/02/2014  . Muscle spasm of left lower extremity 03/21/2007   Qualifier: Diagnosis of  By: Wynona Luna   . Osteoarthritis   . Osteopenia 08/17/2014  . Preventative health care 04/05/2016  . Shortness of breath   . Skin lesion 07/10/2012  . Urinary incontinence 07/10/2012  . Vitamin D deficiency 04/03/2016    Past Surgical History:  Procedure Laterality Date  . APPENDECTOMY    . BREAST CYST EXCISION Bilateral    right breast, twice  . OVARY SURGERY     right ovary & tube replacement  . WRIST GANGLION EXCISION     rt    Family History  Problem Relation Age of Onset  . Brain cancer Mother         deceased age 11  . Alcohol abuse Mother   . Obesity Mother   . Other Brother        died of sepsis  . Gout Brother   . Alcohol abuse Father   . Cancer Sister        throat  . Pulmonary embolism Daughter   . Mental illness Daughter        anxiety  . Diabetes Maternal Aunt   . Diabetes Maternal Aunt   . Seizures Maternal Aunt   . Birth defects Maternal Aunt   . Lupus Grandchild   . Sickle cell anemia Grandchild   . Colon cancer Neg Hx   . Breast cancer Neg Hx     Social History   Socioeconomic History  . Marital status: Married    Spouse name: Shanon Brow  . Number of children: Not on file  . Years of education: Not on file  . Highest education level: Not on file  Occupational History  . Occupation: retired  Tobacco Use  . Smoking status: Former Smoker    Quit date: 01/13/1991    Years since quitting: 28.8  . Smokeless tobacco: Never Used  . Tobacco comment: quit 20 years ago 1/2 ppd x 40 years  Vaping Use  . Vaping  Use: Never used  Substance and Sexual Activity  . Alcohol use: No  . Drug use: No  . Sexual activity: Never    Comment: lives with husband, no dietary restrictions,   Other Topics Concern  . Not on file  Social History Narrative   Former Smoker - quit 20 years ago (1/2 ppd x 40 years)   Widowed   1 Daughter        Social Determinants of Radio broadcast assistant Strain:   . Difficulty of Paying Living Expenses: Not on file  Food Insecurity:   . Worried About Charity fundraiser in the Last Year: Not on file  . Ran Out of Food in the Last Year: Not on file  Transportation Needs:   . Lack of Transportation (Medical): Not on file  . Lack of Transportation (Non-Medical): Not on file  Physical Activity:   . Days of Exercise per Week: Not on file  . Minutes of Exercise per Session: Not on file  Stress:   . Feeling of Stress : Not on file  Social Connections:   . Frequency of Communication with Friends and Family: Not on file  . Frequency of Social  Gatherings with Friends and Family: Not on file  . Attends Religious Services: Not on file  . Active Member of Clubs or Organizations: Not on file  . Attends Archivist Meetings: Not on file  . Marital Status: Not on file  Intimate Partner Violence:   . Fear of Current or Ex-Partner: Not on file  . Emotionally Abused: Not on file  . Physically Abused: Not on file  . Sexually Abused: Not on file    Outpatient Medications Prior to Visit  Medication Sig Dispense Refill  . albuterol (VENTOLIN HFA) 108 (90 Base) MCG/ACT inhaler Inhale 2 puffs into the lungs every 6 (six) hours as needed for wheezing or shortness of breath. 18 g 5  . amLODipine (NORVASC) 10 MG tablet Take 1 tablet (10 mg total) by mouth daily. 90 tablet 3  . aspirin 81 MG tablet Take 81 mg by mouth daily.      . calcium carbonate (OS-CAL) 600 MG TABS Take 600 mg by mouth daily.      Marland Kitchen dicyclomine (BENTYL) 20 MG tablet Take 1 tablet (20 mg total) by mouth 3 (three) times daily as needed for spasms. 20 tablet 0  . famotidine (PEPCID) 20 MG tablet TAKE 1 TABLET(20 MG) BY MOUTH TWICE DAILY 180 tablet 1  . Garlic 416 MG CAPS Take 1 capsule by mouth daily.    Javier Docker Oil 1000 MG CAPS Take 1,000 capsules by mouth daily.    . Multiple Vitamin (MULTIVITAMIN) tablet Take 1 tablet by mouth daily.    . SYMBICORT 80-4.5 MCG/ACT inhaler INHALE 2 PUFFS INTO THE LUNGS TWICE DAILY 10.2 g 2  . XYZAL 5 MG tablet Take 1 tablet (5 mg total) by mouth every evening. 90 tablet 3  . promethazine-dextromethorphan (PROMETHAZINE-DM) 6.25-15 MG/5ML syrup Take 5 mLs by mouth 4 (four) times daily as needed. 118 mL 0   No facility-administered medications prior to visit.    Allergies  Allergen Reactions  . Allegra [Fexofenadine] Shortness Of Breath  . Other     Flu shot, high fever, mental status changes, vomiting x twice  . Erythromycin Nausea Only  . Oxycodone-Acetaminophen Nausea And Vomiting  . Propoxyphene N-Acetaminophen Nausea And  Vomiting      severe vomiting  . Zocor [Simvastatin] Other (See Comments)  Myalgia    Review of Systems  Constitutional: Negative for fever and malaise/fatigue.  HENT: Negative for congestion.   Eyes: Negative for blurred vision.  Respiratory: Negative for shortness of breath.   Cardiovascular: Negative for chest pain, palpitations and leg swelling.  Gastrointestinal: Negative for abdominal pain, blood in stool and nausea.  Genitourinary: Negative for dysuria and frequency.  Musculoskeletal: Positive for back pain and falls.       Rolled out of bed once  Skin: Negative for rash.  Neurological: Negative for dizziness, loss of consciousness and headaches.  Endo/Heme/Allergies: Negative for environmental allergies.  Psychiatric/Behavioral: Negative for depression. The patient is not nervous/anxious.        Objective:    Physical Exam Vitals and nursing note reviewed.  Constitutional:      General: She is not in acute distress.    Appearance: She is well-developed.  HENT:     Head: Normocephalic and atraumatic.     Nose: Nose normal.  Eyes:     General:        Right eye: No discharge.        Left eye: No discharge.  Cardiovascular:     Rate and Rhythm: Normal rate and regular rhythm.     Heart sounds: No murmur heard.   Pulmonary:     Effort: Pulmonary effort is normal.     Breath sounds: Normal breath sounds.  Abdominal:     General: Bowel sounds are normal.     Palpations: Abdomen is soft.     Tenderness: There is no abdominal tenderness.  Musculoskeletal:     Cervical back: Normal range of motion and neck supple.  Skin:    General: Skin is warm and dry.  Neurological:     Mental Status: She is alert and oriented to person, place, and time.     BP 108/82 (BP Location: Right Arm)   Pulse 60   Temp 98.1 F (36.7 C)   Resp 16   Wt 234 lb 3.2 oz (106.2 kg)   LMP  (LMP Unknown)   SpO2 96%   BMI 34.59 kg/m  Wt Readings from Last 3 Encounters:  11/14/19  234 lb 3.2 oz (106.2 kg)  08/14/19 (!) 229 lb 12.8 oz (104.2 kg)  08/09/18 216 lb 6.4 oz (98.2 kg)    Diabetic Foot Exam - Simple   No data filed     Lab Results  Component Value Date   WBC 5.2 02/09/2019   HGB 13.5 02/09/2019   HCT 41.3 02/09/2019   PLT 203.0 02/09/2019   GLUCOSE 93 08/14/2019   CHOL 185 02/09/2019   TRIG 66.0 02/09/2019   HDL 43.00 02/09/2019   LDLCALC 129 (H) 02/09/2019   ALT 11 08/14/2019   AST 12 08/14/2019   NA 140 08/14/2019   K 4.3 08/14/2019   CL 106 08/14/2019   CREATININE 1.09 08/14/2019   BUN 16 08/14/2019   CO2 28 08/14/2019   TSH 2.96 02/09/2019   HGBA1C 5.4 02/09/2019    Lab Results  Component Value Date   TSH 2.96 02/09/2019   Lab Results  Component Value Date   WBC 5.2 02/09/2019   HGB 13.5 02/09/2019   HCT 41.3 02/09/2019   MCV 87.9 02/09/2019   PLT 203.0 02/09/2019   Lab Results  Component Value Date   NA 140 08/14/2019   K 4.3 08/14/2019   CO2 28 08/14/2019   GLUCOSE 93 08/14/2019   BUN 16 08/14/2019   CREATININE 1.09 08/14/2019  BILITOT 0.4 08/14/2019   ALKPHOS 56 08/14/2019   AST 12 08/14/2019   ALT 11 08/14/2019   PROT 6.9 08/14/2019   ALBUMIN 3.9 08/14/2019   CALCIUM 9.2 08/14/2019   ANIONGAP 7 02/26/2018   GFR 58.51 (L) 08/14/2019   Lab Results  Component Value Date   CHOL 185 02/09/2019   Lab Results  Component Value Date   HDL 43.00 02/09/2019   Lab Results  Component Value Date   LDLCALC 129 (H) 02/09/2019   Lab Results  Component Value Date   TRIG 66.0 02/09/2019   Lab Results  Component Value Date   CHOLHDL 4 02/09/2019   Lab Results  Component Value Date   HGBA1C 5.4 02/09/2019       Assessment & Plan:   Problem List Items Addressed This Visit    Hyperlipidemia, mixed     encouraged heart healthy diet, avoid trans fats, minimize simple carbs and saturated fats. Increase exercise as tolerated.       Relevant Orders   Lipid panel   Essential hypertension    Well controlled,  no changes to meds. Encouraged heart healthy diet such as the DASH diet and exercise as tolerated.       Relevant Orders   CBC   Comprehensive metabolic panel   TSH   Low back pain    Encouraged moist heat and gentle stretching as tolerated. May try NSAIDs and prescription meds as directed and report if symptoms worsen or seek immediate care. Has used T and C #3 in past is given a refill and a contract and she is asked to minimize use and to use plain tylenol prn      Relevant Medications   acetaminophen-codeine (TYLENOL #3) 300-30 MG tablet   Vitamin D deficiency    Supplement and monitor      Relevant Orders   VITAMIN D 25 Hydroxy (Vit-D Deficiency, Fractures)   Insulin resistance    hgba1c acceptable, minimize simple carbs. Increase exercise as tolerated.       Relevant Orders   Hemoglobin A1c      I have discontinued Rogue Jury. Calixte "Dot"'s promethazine-dextromethorphan. I am also having her maintain her aspirin, calcium carbonate, Garlic, multivitamin, Krill Oil, dicyclomine, Xyzal, famotidine, Symbicort, albuterol, amLODipine, and acetaminophen-codeine.  Meds ordered this encounter  Medications  . acetaminophen-codeine (TYLENOL #3) 300-30 MG tablet    Sig: Take 1-2 tablets by mouth every 4 (four) hours as needed for moderate pain or severe pain.    Dispense:  40 tablet    Refill:  0     Penni Homans, MD

## 2019-11-14 NOTE — Assessment & Plan Note (Signed)
Supplement and monitor 

## 2019-11-14 NOTE — Assessment & Plan Note (Signed)
Well controlled, no changes to meds. Encouraged heart healthy diet such as the DASH diet and exercise as tolerated.  °

## 2019-11-14 NOTE — Assessment & Plan Note (Addendum)
encouraged heart healthy diet, avoid trans fats, minimize simple carbs and saturated fats. Increase exercise as tolerated 

## 2019-11-14 NOTE — Assessment & Plan Note (Signed)
hgba1c acceptable, minimize simple carbs. Increase exercise as tolerated.  

## 2019-11-14 NOTE — Patient Instructions (Addendum)
Tylenol/Acetaminophen max of 3000 mg in 24 hours.   Shingrix  Is the new shingles shot, 2 shots over 2-6 months at the pharmacy   Chronic Back Pain When back pain lasts longer than 3 months, it is called chronic back pain.The cause of your back pain may not be known. Some common causes include:  Wear and tear (degenerative disease) of the bones, ligaments, or disks in your back.  Inflammation and stiffness in your back (arthritis). People who have chronic back pain often go through certain periods in which the pain is more intense (flare-ups). Many people can learn to manage the pain with home care. Follow these instructions at home: Pay attention to any changes in your symptoms. Take these actions to help with your pain: Activity   Avoid bending and other activities that make the problem worse.  Maintain a proper position when standing or sitting: ? When standing, keep your upper back and neck straight, with your shoulders pulled back. Avoid slouching. ? When sitting, keep your back straight and relax your shoulders. Do not round your shoulders or pull them backward.  Do not sit or stand in one place for long periods of time.  Take brief periods of rest throughout the day. This will reduce your pain. Resting in a lying or standing position is usually better than sitting to rest.  When you are resting for longer periods, mix in some mild activity or stretching between periods of rest. This will help to prevent stiffness and pain.  Get regular exercise. Ask your health care provider what activities are safe for you.  Do not lift anything that is heavier than 10 lb (4.5 kg). Always use proper lifting technique, which includes: ? Bending your knees. ? Keeping the load close to your body. ? Avoiding twisting.  Sleep on a firm mattress in a comfortable position. Try lying on your side with your knees slightly bent. If you lie on your back, put a pillow under your knees. Managing  pain  If directed, apply ice to the painful area. Your health care provider may recommend applying ice during the first 24-48 hours after a flare-up begins. ? Put ice in a plastic bag. ? Place a towel between your skin and the bag. ? Leave the ice on for 20 minutes, 2-3 times per day.  If directed, apply heat to the affected area as often as told by your health care provider. Use the heat source that your health care provider recommends, such as a moist heat pack or a heating pad. ? Place a towel between your skin and the heat source. ? Leave the heat on for 20-30 minutes. ? Remove the heat if your skin turns bright red. This is especially important if you are unable to feel pain, heat, or cold. You may have a greater risk of getting burned.  Try soaking in a warm tub.  Take over-the-counter and prescription medicines only as told by your health care provider.  Keep all follow-up visits as told by your health care provider. This is important. Contact a health care provider if:  You have pain that is not relieved with rest or medicine. Get help right away if:  You have weakness or numbness in one or both of your legs or feet.  You have trouble controlling your bladder or your bowels.  You have nausea or vomiting.  You have pain in your abdomen.  You have shortness of breath or you faint. This information is not intended  to replace advice given to you by your health care provider. Make sure you discuss any questions you have with your health care provider. Document Revised: 04/21/2018 Document Reviewed: 07/08/2016 Elsevier Patient Education  [place].

## 2019-11-15 ENCOUNTER — Encounter: Payer: Self-pay | Admitting: *Deleted

## 2019-11-15 LAB — COMPREHENSIVE METABOLIC PANEL
AG Ratio: 1.3 (calc) (ref 1.0–2.5)
ALT: 11 U/L (ref 6–29)
AST: 13 U/L (ref 10–35)
Albumin: 4 g/dL (ref 3.6–5.1)
Alkaline phosphatase (APISO): 54 U/L (ref 37–153)
BUN/Creatinine Ratio: 19 (calc) (ref 6–22)
BUN: 23 mg/dL (ref 7–25)
CO2: 27 mmol/L (ref 20–32)
Calcium: 9.4 mg/dL (ref 8.6–10.4)
Chloride: 105 mmol/L (ref 98–110)
Creat: 1.24 mg/dL — ABNORMAL HIGH (ref 0.60–0.93)
Globulin: 3 g/dL (calc) (ref 1.9–3.7)
Glucose, Bld: 89 mg/dL (ref 65–99)
Potassium: 4.3 mmol/L (ref 3.5–5.3)
Sodium: 142 mmol/L (ref 135–146)
Total Bilirubin: 0.5 mg/dL (ref 0.2–1.2)
Total Protein: 7 g/dL (ref 6.1–8.1)

## 2019-11-15 LAB — LIPID PANEL
Cholesterol: 201 mg/dL — ABNORMAL HIGH (ref <200)
HDL: 45 mg/dL — ABNORMAL LOW (ref >=50)
LDL Cholesterol (Calc): 137 mg/dL (calc) — ABNORMAL HIGH
Non-HDL Cholesterol (Calc): 156 mg/dL (calc) — ABNORMAL HIGH (ref <130)
Total CHOL/HDL Ratio: 4.5 (calc) (ref <5.0)
Triglycerides: 86 mg/dL (ref <150)

## 2019-11-15 LAB — TSH: TSH: 3.07 mIU/L (ref 0.40–4.50)

## 2019-11-15 LAB — CBC
HCT: 40.3 % (ref 35.0–45.0)
Hemoglobin: 13.7 g/dL (ref 11.7–15.5)
MCH: 29.6 pg (ref 27.0–33.0)
MCHC: 34 g/dL (ref 32.0–36.0)
MCV: 87 fL (ref 80.0–100.0)
MPV: 11.3 fL (ref 7.5–12.5)
Platelets: 196 10*3/uL (ref 140–400)
RBC: 4.63 10*6/uL (ref 3.80–5.10)
RDW: 13.7 % (ref 11.0–15.0)
WBC: 5 10*3/uL (ref 3.8–10.8)

## 2019-11-15 LAB — HEMOGLOBIN A1C
Hgb A1c MFr Bld: 5.4 % of total Hgb (ref <5.7)
Mean Plasma Glucose: 108 (calc)
eAG (mmol/L): 6 (calc)

## 2019-11-15 LAB — VITAMIN D 25 HYDROXY (VIT D DEFICIENCY, FRACTURES): Vit D, 25-Hydroxy: 43 ng/mL (ref 30–100)

## 2019-12-12 ENCOUNTER — Ambulatory Visit: Payer: Self-pay | Admitting: *Deleted

## 2019-12-28 ENCOUNTER — Other Ambulatory Visit: Payer: Self-pay

## 2019-12-28 ENCOUNTER — Telehealth: Payer: Self-pay

## 2019-12-28 ENCOUNTER — Ambulatory Visit (INDEPENDENT_AMBULATORY_CARE_PROVIDER_SITE_OTHER): Payer: Medicare Other

## 2019-12-28 VITALS — BP 130/80 | HR 72 | Temp 98.2°F | Resp 16 | Ht 69.0 in | Wt 235.8 lb

## 2019-12-28 DIAGNOSIS — Z780 Asymptomatic menopausal state: Secondary | ICD-10-CM

## 2019-12-28 DIAGNOSIS — Z0000 Encounter for general adult medical examination without abnormal findings: Secondary | ICD-10-CM

## 2019-12-28 NOTE — Telephone Encounter (Signed)
Patient is requesting refills of Symbicort.

## 2019-12-28 NOTE — Patient Instructions (Signed)
Terri Mckee , Thank you for taking time to come for your Medicare Wellness Visit. I appreciate your ongoing commitment to your health goals. Please review the following plan we discussed and let me know if I can assist you in the future.   Screening recommendations/referrals: Colonoscopy: No longer required after age 79. Mammogram: Completed 03/20/2019-Due 03/19/2020 Bone Density: Ordered today. Someone will be calling you to schedule. Recommended yearly ophthalmology/optometry visit for glaucoma screening and checkup Recommended yearly dental visit for hygiene and checkup  Vaccinations: Influenza vaccine: Declined Pneumococcal vaccine: Completed vaccines Tdap vaccine: Up to date-Due-07/24/2025 Shingles vaccine: Discuss with pharmacy  Covid-19:Completed vaccines  Advanced directives: Declined information today  Conditions/risks identified: See problem list  Next appointment: Follow up in one year for your annual wellness visit    Preventive Care 33 Years and Older, Female Preventive care refers to lifestyle choices and visits with your health care provider that can promote health and wellness. What does preventive care include?  A yearly physical exam. This is also called an annual well check.  Dental exams once or twice a year.  Routine eye exams. Ask your health care provider how often you should have your eyes checked.  Personal lifestyle choices, including:  Daily care of your teeth and gums.  Regular physical activity.  Eating a healthy diet.  Avoiding tobacco and drug use.  Limiting alcohol use.  Practicing safe sex.  Taking low-dose aspirin every day.  Taking vitamin and mineral supplements as recommended by your health care provider. What happens during an annual well check? The services and screenings done by your health care provider during your annual well check will depend on your age, overall health, lifestyle risk factors, and family history of  disease. Counseling  Your health care provider may ask you questions about your:  Alcohol use.  Tobacco use.  Drug use.  Emotional well-being.  Home and relationship well-being.  Sexual activity.  Eating habits.  History of falls.  Memory and ability to understand (cognition).  Work and work Statistician.  Reproductive health. Screening  You may have the following tests or measurements:  Height, weight, and BMI.  Blood pressure.  Lipid and cholesterol levels. These may be checked every 5 years, or more frequently if you are over 85 years old.  Skin check.  Lung cancer screening. You may have this screening every year starting at age 75 if you have a 30-pack-year history of smoking and currently smoke or have quit within the past 15 years.  Fecal occult blood test (FOBT) of the stool. You may have this test every year starting at age 40.  Flexible sigmoidoscopy or colonoscopy. You may have a sigmoidoscopy every 5 years or a colonoscopy every 10 years starting at age 67.  Hepatitis C blood test.  Hepatitis B blood test.  Sexually transmitted disease (STD) testing.  Diabetes screening. This is done by checking your blood sugar (glucose) after you have not eaten for a while (fasting). You may have this done every 1-3 years.  Bone density scan. This is done to screen for osteoporosis. You may have this done starting at age 53.  Mammogram. This may be done every 1-2 years. Talk to your health care provider about how often you should have regular mammograms. Talk with your health care provider about your test results, treatment options, and if necessary, the need for more tests. Vaccines  Your health care provider may recommend certain vaccines, such as:  Influenza vaccine. This is recommended every year.  Tetanus, diphtheria, and acellular pertussis (Tdap, Td) vaccine. You may need a Td booster every 10 years.  Zoster vaccine. You may need this after age  4.  Pneumococcal 13-valent conjugate (PCV13) vaccine. One dose is recommended after age 106.  Pneumococcal polysaccharide (PPSV23) vaccine. One dose is recommended after age 30. Talk to your health care provider about which screenings and vaccines you need and how often you need them. This information is not intended to replace advice given to you by your health care provider. Make sure you discuss any questions you have with your health care provider. Document Released: 01/25/2015 Document Revised: 09/18/2015 Document Reviewed: 10/30/2014 Elsevier Interactive Patient Education  2017 [place] Prevention in the Home Falls can cause injuries. They can happen to people of all ages. There are many things you can do to make your home safe and to help prevent falls. What can I do on the outside of my home?  Regularly fix the edges of walkways and driveways and fix any cracks.  Remove anything that might make you trip as you walk through a door, such as a raised step or threshold.  Trim any bushes or trees on the path to your home.  Use bright outdoor lighting.  Clear any walking paths of anything that might make someone trip, such as rocks or tools.  Regularly check to see if handrails are loose or broken. Make sure that both sides of any steps have handrails.  Any raised decks and porches should have guardrails on the edges.  Have any leaves, snow, or ice cleared regularly.  Use sand or salt on walking paths during winter.  Clean up any spills in your garage right away. This includes oil or grease spills. What can I do in the bathroom?  Use night lights.  Install grab bars by the toilet and in the tub and shower. Do not use towel bars as grab bars.  Use non-skid mats or decals in the tub or shower.  If you need to sit down in the shower, use a plastic, non-slip stool.  Keep the floor dry. Clean up any water that spills on the floor as soon as it happens.  Remove  soap buildup in the tub or shower regularly.  Attach bath mats securely with double-sided non-slip rug tape.  Do not have throw rugs and other things on the floor that can make you trip. What can I do in the bedroom?  Use night lights.  Make sure that you have a light by your bed that is easy to reach.  Do not use any sheets or blankets that are too big for your bed. They should not hang down onto the floor.  Have a firm chair that has side arms. You can use this for support while you get dressed.  Do not have throw rugs and other things on the floor that can make you trip. What can I do in the kitchen?  Clean up any spills right away.  Avoid walking on wet floors.  Keep items that you use a lot in easy-to-reach places.  If you need to reach something above you, use a strong step stool that has a grab bar.  Keep electrical cords out of the way.  Do not use floor polish or wax that makes floors slippery. If you must use wax, use non-skid floor wax.  Do not have throw rugs and other things on the floor that can make you trip. What can I do  with my stairs?  Do not leave any items on the stairs.  Make sure that there are handrails on both sides of the stairs and use them. Fix handrails that are broken or loose. Make sure that handrails are as long as the stairways.  Check any carpeting to make sure that it is firmly attached to the stairs. Fix any carpet that is loose or worn.  Avoid having throw rugs at the top or bottom of the stairs. If you do have throw rugs, attach them to the floor with carpet tape.  Make sure that you have a light switch at the top of the stairs and the bottom of the stairs. If you do not have them, ask someone to add them for you. What else can I do to help prevent falls?  Wear shoes that:  Do not have high heels.  Have rubber bottoms.  Are comfortable and fit you well.  Are closed at the toe. Do not wear sandals.  If you use a  stepladder:  Make sure that it is fully opened. Do not climb a closed stepladder.  Make sure that both sides of the stepladder are locked into place.  Ask someone to hold it for you, if possible.  Clearly mark and make sure that you can see:  Any grab bars or handrails.  First and last steps.  Where the edge of each step is.  Use tools that help you move around (mobility aids) if they are needed. These include:  Canes.  Walkers.  Scooters.  Crutches.  Turn on the lights when you go into a dark area. Replace any light bulbs as soon as they burn out.  Set up your furniture so you have a clear path. Avoid moving your furniture around.  If any of your floors are uneven, fix them.  If there are any pets around you, be aware of where they are.  Review your medicines with your doctor. Some medicines can make you feel dizzy. This can increase your chance of falling. Ask your doctor what other things that you can do to help prevent falls. This information is not intended to replace advice given to you by your health care provider. Make sure you discuss any questions you have with your health care provider. Document Released: 10/25/2008 Document Revised: 06/06/2015 Document Reviewed: 02/02/2014 Elsevier Interactive Patient Education  2017 [employer].

## 2019-12-28 NOTE — Progress Notes (Addendum)
Subjective:   Terri Mckee is a 79 y.o. female who presents for Medicare Annual (Subsequent) preventive examination.  Review of Systems     Cardiac Risk Factors include: advanced age (>56men, >66 women);hypertension;dyslipidemia;sedentary lifestyle;obesity (BMI >30kg/m2)     Objective:    Today's Vitals   12/28/19 1434 12/28/19 1440  BP: 130/80   Pulse: 72   Resp: 16   Temp: 98.2 F (36.8 C)   TempSrc: Oral   SpO2: 97%   Weight: 235 lb 12.8 oz (107 kg)   Height: 5\' 9"  (1.753 m)   PainSc:  3    Body mass index is 34.82 kg/m.  Advanced Directives 12/28/2019 02/14/2019 12/06/2018 02/26/2018 05/24/2015 08/17/2014 02/13/2014  Does Patient Have a Medical Advance Directive? No No No No No No No  Would patient like information on creating a medical advance directive? No - Patient declined No - Patient declined No - Patient declined - No - patient declined information No - patient declined information No - patient declined information    Current Medications (verified) Outpatient Encounter Medications as of 12/28/2019  Medication Sig   acetaminophen-codeine (TYLENOL #3) 300-30 MG tablet Take 1-2 tablets by mouth every 4 (four) hours as needed for moderate pain or severe pain.   albuterol (VENTOLIN HFA) 108 (90 Base) MCG/ACT inhaler Inhale 2 puffs into the lungs every 6 (six) hours as needed for wheezing or shortness of breath.   aspirin 81 MG tablet Take 81 mg by mouth daily.   calcium carbonate (OS-CAL) 600 MG TABS Take 600 mg by mouth daily.   dicyclomine (BENTYL) 20 MG tablet Take 1 tablet (20 mg total) by mouth 3 (three) times daily as needed for spasms.   famotidine (PEPCID) 20 MG tablet TAKE 1 TABLET(20 MG) BY MOUTH TWICE DAILY   felodipine (PLENDIL) 10 MG 24 hr tablet Take 1 tablet (10 mg total) by mouth daily.   Garlic 237 MG CAPS Take 1 capsule by mouth daily.   Krill Oil 1000 MG CAPS Take 1,000 capsules by mouth daily.   Multiple Vitamin (MULTIVITAMIN) tablet Take 1  tablet by mouth daily.   SYMBICORT 80-4.5 MCG/ACT inhaler INHALE 2 PUFFS INTO THE LUNGS TWICE DAILY   XYZAL 5 MG tablet Take 1 tablet (5 mg total) by mouth every evening.   No facility-administered encounter medications on file as of 12/28/2019.    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   Cataract    Cataracts, bilateral 07/25/2015   Cervical cancer screening 07/07/2012   Colon polyps    Elevated serum creatinine    with ACE inhibitor (Normal MRA of renal arteries)   GERD (gastroesophageal reflux disease)    History of cyst of breast    Hoarseness of voice 03/13/2012   Hypertension    Lactose intolerance    Medicare annual wellness visit, subsequent 09/02/2014   Muscle spasm of left lower extremity 03/21/2007   Qualifier: Diagnosis of  By: Wynona Luna    Osteoarthritis    Osteopenia 08/17/2014   Preventative health care 04/05/2016   Shortness of breath    Skin lesion  07/10/2012   Urinary incontinence 07/10/2012   Vitamin D deficiency 04/03/2016   Past Surgical History:  Procedure Laterality Date   APPENDECTOMY     BREAST CYST EXCISION Bilateral    right breast, twice   OVARY SURGERY     right ovary & tube replacement   WRIST GANGLION EXCISION     rt   Family History  Problem Relation Age of Onset   Brain cancer Mother        deceased age 72   Alcohol abuse Mother    Obesity Mother    Other Brother        died of sepsis   Gout Brother    Alcohol abuse Father    Cancer Sister        throat   Pulmonary embolism Daughter    Mental illness Daughter        anxiety   Diabetes Maternal Aunt     Diabetes Maternal Aunt    Seizures Maternal Aunt    Birth defects Maternal Aunt    Lupus Grandchild    Sickle cell anemia Grandchild    Colon cancer Neg Hx    Breast cancer Neg Hx    Social History   Socioeconomic History   Marital status: Married    Spouse name: Shanon Brow   Number of children: Not on file   Years of education: Not on file   Highest education level: Not on file  Occupational History   Occupation: retired  Tobacco Use   Smoking status: Former Smoker    Quit date: 01/13/1991    Years since quitting: 28.9   Smokeless tobacco: Never Used   Tobacco comment: quit 20 years ago 1/2 ppd x 40 years  Vaping Use   Vaping Use: Never used  Substance and Sexual Activity   Alcohol use: No   Drug use: No   Sexual activity: Never    Comment: lives with husband, no dietary restrictions,   Other Topics Concern   Not on file  Social History Narrative   Former Smoker - quit 20 years ago (1/2 ppd x 40 years)   Widowed   1 Daughter        Social Determinants of Radio broadcast assistant Strain: Low Risk    Difficulty of Paying Living Expenses: Not hard at all  Food Insecurity: No Food Insecurity   Worried About Charity fundraiser in the Last Year: Never true   Arboriculturist in the Last Year: Never true  Transportation Needs: No Transportation Needs   Lack of Transportation (Medical): No   Lack of Transportation (Non-Medical): No  Physical Activity: Inactive   Days of Exercise per Week: 0 days   Minutes of Exercise per Session: 0 min  Stress: No Stress Concern Present   Feeling of Stress : Not at all  Social Connections: Moderately Integrated   Frequency of Communication with Friends and Family: More than three times a week   Frequency of Social Gatherings with Friends and Family: More than three times a week   Attends Religious Services: More than 4 times per year   Active Member of [employer] or Organizations: No   Attends Music therapist: Never    Marital Status: Married    Tobacco Counseling Counseling given: Not Answered Comment: quit 20 years ago 1/2 ppd x 40 years   Clinical Intake:  Pre-visit preparation completed: Yes  Pain : 0-10 Pain Score: 3  Pain Type: Acute pain Pain  Location: Generalized Pain Onset: 1 to 4 weeks ago Pain Frequency: Intermittent Pain Relieving Factors: Tylenol  Pain Relieving Factors: Tylenol  Nutritional Status: BMI > 30  Obese Nutritional Risks: None Diabetes: No  How often do you need to have someone help you when you read instructions, pamphlets, or other written materials from your doctor or pharmacy?: 1 - Never What is the last grade level you completed in school?: 11th grade  Diabetic?No  Interpreter Needed?: No  Information entered by :: Caroleen Hamman LPN   Activities of Daily Living In your present state of health, do you have any difficulty performing the following activities: 12/28/2019  Hearing? N  Vision? N  Difficulty concentrating or making decisions? N  Walking or climbing stairs? N  Dressing or bathing? N  Doing errands, shopping? N  Preparing Food and eating ? N  Using the Toilet? N  In the past six months, have you accidently leaked urine? N  Do you have problems with loss of bowel control? N  Managing your Medications? N  Managing your Finances? N  Housekeeping or managing your Housekeeping? N  Some recent data might be hidden    Patient Care Team: [name], MD as PCP - General (Family Medicine) Berle Mull, MD as Consulting Physician (Family Medicine) Irene Shipper, MD as Consulting Physician (Gastroenterology) Bjorn Loser, MD as Consulting Physician (Urology) Druscilla Brownie, MD as Consulting Physician (Dermatology)  Indicate any recent Medical Services you may have received from other than [facility] providers in the past year (date may be approximate).     Assessment:   This is a routine wellness examination for  Mayda.  Hearing/Vision screen  Hearing Screening   125Hz  250Hz  500Hz  1000Hz  2000Hz  3000Hz  4000Hz  6000Hz  8000Hz   Right ear:           Left ear:           Comments: Patient states she has had hearing loss for years  Vision Screening Comments: Reading glasses Last eye exam-2-3 months ago-Dr. Kathlen Mody  Dietary issues and exercise activities discussed: Current Exercise Habits: The patient does not participate in regular exercise at present, Exercise limited by: None identified  Goals      Increase physical activity       Depression Screen PHQ 2/9 Scores 12/28/2019 12/06/2018 03/15/2018 10/18/2017 04/03/2016 07/25/2015 03/29/2014  PHQ - 2 Score 0 0 1 0 0 0 0  PHQ- 9 Score - - 4 - - - -  Exception Documentation - - Medical reason - - - -    Fall Risk Fall Risk  12/28/2019 12/06/2018 10/18/2017 04/03/2016 07/25/2015  Falls in the past year? 1 0 No No No  Number falls in past yr: 0 0 - - -  Injury with Fall? 0 0 - - -  Follow up Falls prevention discussed Education provided;Falls prevention discussed - - -    FALL RISK PREVENTION PERTAINING TO THE HOME:  Any stairs in or around the home? No  Home free of loose throw rugs in walkways, pet beds, electrical cords, etc? Yes  Adequate lighting in your home to reduce risk of falls? Yes   ASSISTIVE DEVICES UTILIZED TO PREVENT FALLS:  Life alert? No  Use of a cane, walker or w/c? No  Grab bars in the bathroom? Yes  Shower chair or bench in shower? No  Elevated toilet seat or a handicapped toilet? No   TIMED UP AND GO:  Was the test performed? Yes .  Length of time to  ambulate 10 feet: 10 sec.   Gait slow and steady without use of assistive device  Cognitive Function:Normal cognitive status assessed by direct observation by this Nurse Health Advisor. No abnormalities found.          Immunizations Immunization History  Administered Date(s) Administered   PFIZER SARS-COV-2 Vaccination 01/27/2019, 02/17/2019, 10/18/2019    Pneumococcal Conjugate-13 09/21/2013   Pneumococcal Polysaccharide-23 10/31/2010, 10/05/2016   Td 07/25/2015    TDAP status: Up to date  Flu Vaccine status: Declined, Education has been provided regarding the importance of this vaccine but patient still declined. Advised may receive this vaccine at local pharmacy or Health Dept. Aware to provide a copy of the vaccination record if obtained from local pharmacy or Health Dept. Verbalized acceptance and understanding.  Pneumococcal vaccine status: Up to date  Covid-19 vaccine status: Completed vaccines  Qualifies for Shingles Vaccine? Yes   Zostavax completed No   Shingrix Completed?: No.    Education has been provided regarding the importance of this vaccine. Patient has been advised to call insurance company to determine out of pocket expense if they have not yet received this vaccine. Advised may also receive vaccine at local pharmacy or Health Dept. Verbalized acceptance and understanding.  Screening Tests Health Maintenance  Topic Date Due   Hepatitis C Screening  Never done   COLONOSCOPY  10/05/2016   INFLUENZA VACCINE  08/13/2019   TETANUS/TDAP  07/24/2025   DEXA SCAN  Completed   COVID-19 Vaccine  Completed   PNA vac Low Risk Adult  Completed    Health Maintenance  Health Maintenance Due  Topic Date Due   Hepatitis C Screening  Never done   COLONOSCOPY  10/05/2016   INFLUENZA VACCINE  08/13/2019    Colorectal cancer screening: No longer required.   Mammogram status: Completed Bilateral-03/20/2019. Repeat every year  Bone Density status: Ordered today. Pt provided with contact info and advised to call to schedule appt.  Lung Cancer Screening: (Low Dose CT Chest recommended if Age 9-80 years, 30 pack-year currently smoking OR have quit w/in 15years.) does not qualify.    Additional Screening:  Hepatitis C Screening: does not qualify  Vision Screening: Recommended annual ophthalmology exams for early detection of  glaucoma and other disorders of the eye. Is the patient up to date with their annual eye exam?  Yes  Who is the provider or what is the name of the office in which the patient attends annual eye exams? Dr. Kathlen Mody   Dental Screening: Recommended annual dental exams for proper oral hygiene  Community Resource Referral / Chronic Care Management: CRR required this visit?  No   CCM required this visit?  No      Plan:     I have personally reviewed and noted the following in the patient's chart:   Medical and social history Use of alcohol, tobacco or illicit drugs  Current medications and supplements Functional ability and status Nutritional status Physical activity Advanced directives List of other physicians Hospitalizations, surgeries, and ER visits in previous 12 months Vitals Screenings to include cognitive, depression, and falls Referrals and appointments  In addition, I have reviewed and discussed with patient certain preventive protocols, quality metrics, and best practice recommendations. A written personalized care plan for preventive services as well as general preventive health recommendations were provided to patient.     Marta Antu, LPN   [id number]  Nurse Health Advisor  Nurse Notes: None  I have personally reviewed this encounter including the documentation  in this note and have collaborated with the care management provider regarding care management and care coordination activities to include development and update of the comprehensive care plan. I am certifying that I agree with the content of this note and encounter as supervising physician.

## 2020-01-12 ENCOUNTER — Other Ambulatory Visit: Payer: Self-pay | Admitting: Family Medicine

## 2020-01-12 DIAGNOSIS — J441 Chronic obstructive pulmonary disease with (acute) exacerbation: Secondary | ICD-10-CM

## 2020-02-02 ENCOUNTER — Telehealth (INDEPENDENT_AMBULATORY_CARE_PROVIDER_SITE_OTHER): Payer: Medicare Other | Admitting: Family Medicine

## 2020-02-02 ENCOUNTER — Other Ambulatory Visit: Payer: Self-pay

## 2020-02-02 ENCOUNTER — Encounter: Payer: Self-pay | Admitting: Family Medicine

## 2020-02-02 DIAGNOSIS — J069 Acute upper respiratory infection, unspecified: Secondary | ICD-10-CM

## 2020-02-02 MED ORDER — HYDROCODONE-HOMATROPINE 5-1.5 MG/5ML PO SYRP: 5.0000 mL | ORAL_SOLUTION | ORAL | 0 refills | Status: DC | PRN

## 2020-02-02 MED ORDER — METHYLPREDNISOLONE 4 MG PO TBPK: ORAL_TABLET | ORAL | 0 refills | Status: DC

## 2020-02-02 NOTE — Progress Notes (Signed)
° °  Subjective:    Patient ID: Terri Mckee, female    DOB: 1940-05-29, 80 y.o.   MRN: [id number]  HPI Virtual Visit via Telephone Note  I connected with the patient on 02/02/20 at  1:45 PM EST by telephone and verified that I am speaking with the correct person using two identifiers.   I discussed the limitations, risks, security and privacy concerns of performing an evaluation and management service by telephone and the availability of in person appointments. I also discussed with the patient that there may be a patient responsible charge related to this service. The patient expressed understanding and agreed to proceed.  Location patient: home Location provider: work or home office Participants present for the call: patient, provider Patient did not have a visit in the prior 7 days to address this/these issue(s).   History of Present Illness: Here with her husband for 4 days of sneezing and coughing up clear sputum.  he has had the same symptoms for 3 days. She denies any fever or chest pain. No ST or headache. No body aches or NVD. She has been wheezing and is mildly SOB. She has albuterol and Symbicort at home for chronic bronchitis, and she has been using both of these. She has used Delsym but this does not help the coughing.  Observations/Objective: Patient sounds cheerful and well on the phone. I do not appreciate any SOB. Speech and thought processing are grossly intact. Patient reported vitals:  Assessment and Plan: Viral URI. First I encouraged both of them to get tested for the Covid-19 virus today if possible. She will drink fluids. We will send in a Medrol dose pack as well as some Hydromet for the cough. Recheck as needed.  Alysia Penna, MD   Follow Up Instructions:     [id number] 5-10 [phone] 11-20 [id number] 21-30 I did not refer this patient for an OV in the next 24 hours for this/these issue(s).  I discussed the assessment and treatment plan with the patient. The  patient was provided an opportunity to ask questions and all were answered. The patient agreed with the plan and demonstrated an understanding of the instructions.   The patient was advised to call back or seek an in-person evaluation if the symptoms worsen or if the condition fails to improve as anticipated.  I provided 11 minutes of non-face-to-face time during this encounter.   Alysia Penna, MD    Review of Systems     Objective:   Physical Exam        Assessment & Plan:

## 2020-03-28 ENCOUNTER — Encounter: Payer: Self-pay | Admitting: Family Medicine

## 2020-03-28 ENCOUNTER — Other Ambulatory Visit: Payer: Self-pay

## 2020-03-28 ENCOUNTER — Other Ambulatory Visit: Payer: Self-pay | Admitting: Family Medicine

## 2020-03-28 ENCOUNTER — Ambulatory Visit (INDEPENDENT_AMBULATORY_CARE_PROVIDER_SITE_OTHER): Payer: Medicare Other | Admitting: Family Medicine

## 2020-03-28 VITALS — BP 124/70 | HR 66 | Temp 97.8°F | Resp 17 | Ht 69.0 in | Wt 233.0 lb

## 2020-03-28 DIAGNOSIS — E782 Mixed hyperlipidemia: Secondary | ICD-10-CM

## 2020-03-28 DIAGNOSIS — E559 Vitamin D deficiency, unspecified: Secondary | ICD-10-CM

## 2020-03-28 DIAGNOSIS — R700 Elevated erythrocyte sedimentation rate: Secondary | ICD-10-CM

## 2020-03-28 DIAGNOSIS — M791 Myalgia, unspecified site: Secondary | ICD-10-CM | POA: Diagnosis not present

## 2020-03-28 DIAGNOSIS — M25552 Pain in left hip: Secondary | ICD-10-CM

## 2020-03-28 DIAGNOSIS — R739 Hyperglycemia, unspecified: Secondary | ICD-10-CM | POA: Diagnosis not present

## 2020-03-28 DIAGNOSIS — I10 Essential (primary) hypertension: Secondary | ICD-10-CM | POA: Diagnosis not present

## 2020-03-28 DIAGNOSIS — M25551 Pain in right hip: Secondary | ICD-10-CM

## 2020-03-28 DIAGNOSIS — M6281 Muscle weakness (generalized): Secondary | ICD-10-CM

## 2020-03-28 DIAGNOSIS — M79604 Pain in right leg: Secondary | ICD-10-CM

## 2020-03-28 DIAGNOSIS — R7982 Elevated C-reactive protein (CRP): Secondary | ICD-10-CM

## 2020-03-28 DIAGNOSIS — M79605 Pain in left leg: Secondary | ICD-10-CM

## 2020-03-28 DIAGNOSIS — E8881 Metabolic syndrome: Secondary | ICD-10-CM

## 2020-03-28 DIAGNOSIS — R252 Cramp and spasm: Secondary | ICD-10-CM

## 2020-03-28 DIAGNOSIS — M545 Low back pain, unspecified: Secondary | ICD-10-CM

## 2020-03-28 LAB — LIPID PANEL
Cholesterol: 194 mg/dL (ref 0–200)
HDL: 42.6 mg/dL (ref >39.00)
LDL Cholesterol: 137 mg/dL — ABNORMAL HIGH (ref 0–99)
NonHDL: 151.49
Total CHOL/HDL Ratio: 5
Triglycerides: 73 mg/dL (ref 0.0–149.0)
VLDL: 14.6 mg/dL (ref 0.0–40.0)

## 2020-03-28 LAB — COMPREHENSIVE METABOLIC PANEL
ALT: 11 U/L (ref 0–35)
AST: 13 U/L (ref 0–37)
Albumin: 3.9 g/dL (ref 3.5–5.2)
Alkaline Phosphatase: 57 U/L (ref 39–117)
BUN: 17 mg/dL (ref 6–23)
CO2: 27 mEq/L (ref 19–32)
Calcium: 9.1 mg/dL (ref 8.4–10.5)
Chloride: 105 mEq/L (ref 96–112)
Creatinine, Ser: 1.19 mg/dL (ref 0.40–1.20)
GFR: 43.3 mL/min — ABNORMAL LOW (ref >60.00)
Glucose, Bld: 82 mg/dL (ref 70–99)
Potassium: 4.5 mEq/L (ref 3.5–5.1)
Sodium: 142 mEq/L (ref 135–145)
Total Bilirubin: 0.5 mg/dL (ref 0.2–1.2)
Total Protein: 7 g/dL (ref 6.0–8.3)

## 2020-03-28 LAB — CBC
HCT: 41.2 % (ref 36.0–46.0)
Hemoglobin: 14 g/dL (ref 12.0–15.0)
MCHC: 33.8 g/dL (ref 30.0–36.0)
MCV: 86.8 fl (ref 78.0–100.0)
Platelets: 194 10*3/uL (ref 150.0–400.0)
RBC: 4.75 Mil/uL (ref 3.87–5.11)
RDW: 14.8 % (ref 11.5–15.5)
WBC: 6.1 10*3/uL (ref 4.0–10.5)

## 2020-03-28 LAB — TSH: TSH: 2.87 u[IU]/mL (ref 0.35–4.50)

## 2020-03-28 LAB — MAGNESIUM: Magnesium: 2.1 mg/dL (ref 1.5–2.5)

## 2020-03-28 LAB — HIGH SENSITIVITY CRP: CRP, High Sensitivity: 23.14 mg/L — ABNORMAL HIGH (ref 0.000–5.000)

## 2020-03-28 LAB — T4, FREE: Free T4: 0.77 ng/dL (ref 0.60–1.60)

## 2020-03-28 LAB — HEMOGLOBIN A1C: Hgb A1c MFr Bld: 5.4 % (ref 4.6–6.5)

## 2020-03-28 LAB — CK: Total CK: 123 U/L (ref 7–177)

## 2020-03-28 LAB — SEDIMENTATION RATE: Sed Rate: 45 mm/hr — ABNORMAL HIGH (ref 0–30)

## 2020-03-28 MED ORDER — METHYLPREDNISOLONE 4 MG PO TABS: ORAL_TABLET | ORAL | 0 refills | Status: DC

## 2020-03-28 NOTE — Assessment & Plan Note (Signed)
Supplement and monitor 

## 2020-03-28 NOTE — Assessment & Plan Note (Signed)
Well controlled, no changes to meds. Encouraged heart healthy diet such as the DASH diet and exercise as tolerated.  °

## 2020-03-28 NOTE — Assessment & Plan Note (Signed)
Worsening over the past 6 months. No falls or trouble

## 2020-03-28 NOTE — Patient Instructions (Signed)
https://[email or web address]">  Chronic Back Pain When back pain lasts longer than 3 months, it is called chronic back pain. The cause of your back pain may not be known. Some common causes include:  Wear and tear (degenerative disease) of the bones, ligaments, or disks in your back.  Inflammation and stiffness in your back (arthritis). People who have chronic back pain often go through certain periods in which the pain is more intense (flare-ups). Many people can learn to manage the pain with home care. Follow these instructions at home: Pay attention to any changes in your symptoms. Take these actions to help with your pain: Managing pain and stiffness  If directed, apply ice to the painful area. Your health care provider may recommend applying ice during the first 24-48 hours after a flare-up begins. To do this: ? Put ice in a plastic bag. ? Place a towel between your skin and the bag. ? Leave the ice on for 20 minutes, 2-3 times per day.  If directed, apply heat to the affected area as often as told by your health care provider. Use the heat source that your health care provider recommends, such as a moist heat pack or a heating pad. ? Place a towel between your skin and the heat source. ? Leave the heat on for 20-30 minutes. ? Remove the heat if your skin turns bright red. This is especially important if you are unable to feel pain, heat, or cold. You may have a greater risk of getting burned.  Try soaking in a warm tub.      Activity  Avoid bending and other activities that make the problem worse.  Maintain a proper position when standing or sitting: ? When standing, keep your upper back and neck straight, with your shoulders pulled back. Avoid slouching. ? When sitting, keep your back straight and relax your shoulders. Do not round your shoulders or pull them backward.  Do not sit or stand in one place for long periods of time.  Take brief periods of rest  throughout the day. This will reduce your pain. Resting in a lying or standing position is usually better than sitting to rest.  When you are resting for longer periods, mix in some mild activity or stretching between periods of rest. This will help to prevent stiffness and pain.  Get regular exercise. Ask your health care provider what activities are safe for you.  Do not lift anything that is heavier than 10 lb (4.5 kg), or the limit that you are told, until your health care provider says that it is safe. Always use proper lifting technique, which includes: ? Bending your knees. ? Keeping the load close to your body. ? Avoiding twisting.  Sleep on a firm mattress in a comfortable position. Try lying on your side with your knees slightly bent. If you lie on your back, put a pillow under your knees.   Medicines  Treatment may include medicines for pain and inflammation taken by mouth or applied to the skin, prescription pain medicine, or muscle relaxants. Take over-the-counter and prescription medicines only as told by your health care provider.  Ask your health care provider if the medicine prescribed to you: ? Requires you to avoid driving or using machinery. ? Can cause constipation. You may need to take these actions to prevent or treat constipation:  Drink enough fluid to keep your urine pale yellow.  Take over-the-counter or prescription medicines.  Eat foods that are high in fiber, such as   beans, whole grains, and fresh fruits and vegetables.  Limit foods that are high in fat and processed sugars, such as fried or sweet foods. General instructions  Do not use any products that contain nicotine or tobacco, such as cigarettes, e-cigarettes, and chewing tobacco. If you need help quitting, ask your health care provider.  Keep all follow-up visits as told by your health care provider. This is important. Contact a health care provider if:  You have pain that is not relieved with  rest or medicine.  Your pain gets worse, or you have new pain.  You have a high fever.  You have rapid weight loss.  You have trouble doing your normal activities. Get help right away if:  You have weakness or numbness in one or both of your legs or feet.  You have trouble controlling your bladder or your bowels.  You have severe back pain and have any of the following: ? Nausea or vomiting. ? Pain in your abdomen. ? Shortness of breath or you faint. Summary  Chronic back pain is back pain that lasts longer than 3 months.  When a flare-up begins, apply ice to the painful area for the first 24-48 hours.  Apply a moist heat pad or use a heating pad on the painful area as directed by your health care provider.  When you are resting for longer periods, mix in some mild activity or stretching between periods of rest. This will help to prevent stiffness and pain. This information is not intended to replace advice given to you by your health care provider. Make sure you discuss any questions you have with your health care provider. Document Revised: 02/08/2019 Document Reviewed: 02/08/2019 Elsevier Patient Education  2021 [employer]  

## 2020-03-28 NOTE — Progress Notes (Signed)
Patient ID: Terri Mckee, female    DOB: [birth year]  Age: 80 y.o. MRN: [id number]    Subjective:  Subjective  HPI Terri Mckee presents for office visit today accompanied by husband. She reports having muscle weakness and pain in her legs for about 6 months now. She locates the muscle pain and weakness in her thighs and hips bilaterally. She reports that ultimately she has felt unsteady when standing and walking. Only lying down and sitting improves the symptoms. She has been attended some PT sessions in the past, she stopped going due having a high co-pay($40 weekly). Although she attended these session she still feels like it has not helped her pain and weakness in her legs. She endorses having back pain and hip pain as well. She  denies any numbness in her feet. Denies any truama or injury. Denies any chest pain, SOB, fever, abdominal pain, cough, chills, sore throat, dysuria, urinary incontinence, HA, or N/VD. Denies having a poor diet.   She states that she does not check her glucose levels at home.    Review of Systems  Constitutional: Negative for chills, fatigue and fever.  HENT: Negative for congestion, ear pain, rhinorrhea, sinus pressure, sinus pain and sore throat.   Eyes: Negative for pain.  Respiratory: Negative for cough and shortness of breath.   Cardiovascular: Negative for chest pain, palpitations and leg swelling.  Gastrointestinal: Negative for abdominal pain, blood in stool, diarrhea, nausea and vomiting.  Genitourinary: Negative for dysuria, flank pain, frequency, hematuria, vaginal bleeding, vaginal discharge and vaginal pain.  Musculoskeletal: Positive for back pain (lower) and gait problem. Negative for joint swelling and myalgias.       (+) Muscle pain and weakness bilateral LE (+)Hip pain and weakness  Skin: Negative for rash.  Neurological: Negative for dizziness and headaches.    History Past Medical History:  Diagnosis Date  . 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 Acute upper respiratory infection 04/09/2014  . Anemia   . Arthritis    back  . Back pain   . 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 Cataract   . Cataracts, bilateral 07/25/2015  . Cervical cancer screening 07/07/2012  . Colon polyps   . Elevated serum creatinine    with ACE inhibitor (Normal MRA of renal arteries)  . GERD (gastroesophageal reflux disease)   . History of cyst of breast   . Hoarseness of voice 03/13/2012  . Hypertension   . Lactose intolerance   . Medicare annual wellness visit, subsequent 09/02/2014  . Muscle spasm of left lower extremity 03/21/2007   Qualifier: Diagnosis of  By: Wynona Luna   . Osteoarthritis   . Osteopenia 08/17/2014  . Preventative health care 04/05/2016  . Shortness of breath   . Skin lesion 07/10/2012  . Urinary incontinence 07/10/2012  . Vitamin D deficiency 04/03/2016    She has a past surgical history that includes Ovary surgery; Wrist ganglion excision; Appendectomy; and Breast cyst excision (Bilateral).   Her family history includes Alcohol abuse in her father and mother; Birth defects in her maternal aunt; Brain cancer in her mother; Cancer in her sister; Diabetes in her maternal aunt and maternal aunt; Gout in her brother; Lupus in her grandchild; Mental illness in her daughter; Obesity in her mother; Other in her brother; Pulmonary embolism in her daughter; Seizures in her maternal aunt; Sickle cell anemia in her  grandchild.She reports that she quit smoking about 29 years ago. She has never used smokeless tobacco. She reports that she does not drink alcohol and does not use drugs.  Current Outpatient Medications on File Prior to Visit  Medication Sig Dispense Refill  . acetaminophen-codeine (TYLENOL #3) 300-30 MG tablet Take 1-2 tablets by mouth every 4 (four) hours as needed for moderate pain or severe pain. 40 tablet 0  . albuterol (VENTOLIN HFA) 108 (90  Base) MCG/ACT inhaler Inhale 2 puffs into the lungs every 6 (six) hours as needed for wheezing or shortness of breath. 18 g 5  . aspirin 81 MG tablet Take 81 mg by mouth daily.    . calcium carbonate (OS-CAL) 600 MG TABS Take 600 mg by mouth daily.    Marland Kitchen dicyclomine (BENTYL) 20 MG tablet Take 1 tablet (20 mg total) by mouth 3 (three) times daily as needed for spasms. 20 tablet 0  . famotidine (PEPCID) 20 MG tablet TAKE 1 TABLET(20 MG) BY MOUTH TWICE DAILY 180 tablet 1  . felodipine (PLENDIL) 10 MG 24 hr tablet Take 1 tablet (10 mg total) by mouth daily. 90 tablet 1  . Garlic 329 MG CAPS Take 1 capsule by mouth daily.    Marland Kitchen HYDROcodone-homatropine (HYCODAN) 5-1.5 MG/5ML syrup Take 5 mLs by mouth every 4 (four) hours as needed for cough. 240 mL 0  . Krill Oil 1000 MG CAPS Take 1,000 capsules by mouth daily.    . Multiple Vitamin (MULTIVITAMIN) tablet Take 1 tablet by mouth daily.    . SYMBICORT 80-4.5 MCG/ACT inhaler Inhale 2 puffs into the lungs in the morning and at bedtime. 10.2 g 5  . XYZAL 5 MG tablet Take 1 tablet (5 mg total) by mouth every evening. 90 tablet 3   No current facility-administered medications on file prior to visit.     Objective:  Objective  Physical Exam Constitutional:      General: She is not in acute distress.    Appearance: Normal appearance. She is well-developed. She is not ill-appearing.  HENT:     Head: Normocephalic and atraumatic.     Right Ear: External ear normal.     Left Ear: External ear normal.     Nose: Nose normal.  Eyes:     Extraocular Movements: Extraocular movements intact.     Pupils: Pupils are equal, round, and reactive to light.  Cardiovascular:     Rate and Rhythm: Normal rate and regular rhythm.     Pulses: Normal pulses.     Heart sounds: Normal heart sounds. No murmur heard.   Pulmonary:     Effort: Pulmonary effort is normal. No respiratory distress.     Breath sounds: Normal breath sounds. No wheezing, rhonchi or rales.   Abdominal:     General: Bowel sounds are normal.     Palpations: Abdomen is soft. There is no mass.     Tenderness: There is no abdominal tenderness. There is no guarding or rebound.  Musculoskeletal:     Cervical back: Normal range of motion and neck supple.  Skin:    General: Skin is warm and dry.  Neurological:     Mental Status: She is alert and oriented to person, place, and time.  Psychiatric:        Behavior: Behavior normal.    BP 124/70 (BP Location: Left Arm, Patient Position: Sitting, Cuff Size: Normal)   Pulse 66   Temp 97.8 F (36.6 C)   Resp 17  Ht 5\' 9"  (1.753 m)   Wt 233 lb (105.7 kg)   LMP  (LMP Unknown)   SpO2 98%   BMI 34.41 kg/m  Wt Readings from Last 3 Encounters:  03/28/20 233 lb (105.7 kg)  12/28/19 235 lb 12.8 oz (107 kg)  11/14/19 234 lb 3.2 oz (106.2 kg)     Lab Results  Component Value Date   WBC 6.1 03/28/2020   HGB 14.0 03/28/2020   HCT 41.2 03/28/2020   PLT 194.0 03/28/2020   GLUCOSE 82 03/28/2020   CHOL 194 03/28/2020   TRIG 73.0 03/28/2020   HDL 42.60 03/28/2020   LDLCALC 137 (H) 03/28/2020   ALT 11 03/28/2020   AST 13 03/28/2020   NA 142 03/28/2020   K 4.5 03/28/2020   CL 105 03/28/2020   CREATININE 1.19 03/28/2020   BUN 17 03/28/2020   CO2 27 03/28/2020   TSH 2.87 03/28/2020   HGBA1C 5.4 03/28/2020    DG Chest 2 View  Result Date: 08/14/2019 CLINICAL DATA:  Intermittent productive cough. EXAM: CHEST - 2 VIEW COMPARISON:  PA and lateral chest 02/26/2018. FINDINGS: Lungs clear. Heart size normal. No pneumothorax or pleural effusion. No acute or focal bony abnormality. IMPRESSION: Negative chest. Electronically Signed   By: Inge Rise M.D.   On: 08/14/2019 12:04     Assessment & Plan:  Plan   No orders of the defined types were placed in this encounter.   Problem List Items Addressed This Visit    Hyperlipidemia, mixed    Encouraged heart healthy diet, increase exercise, avoid trans fats, consider a krill oil  cap daily      Relevant Orders   Lipid panel (Completed)   Essential hypertension    Well controlled, no changes to meds. Encouraged heart healthy diet such as the DASH diet and exercise as tolerated.       Relevant Orders   CBC (Completed)   Comprehensive metabolic panel (Completed)   Low back pain    Worsening over the past 6 months. No falls or trouble       Vitamin D deficiency    Supplement and monitor      Muscle cramp, nocturnal   Insulin resistance    hgba1c acceptable, minimize simple carbs. Increase exercise as tolerated      Muscle weakness   Relevant Orders   T4, free (Completed)   TSH (Completed)   Myalgia - Primary    With muscle weakness and elevated sed rate and CRP. Will give a medrol dosepak and consider Rheumatology referral after that.      Relevant Orders   Sedimentation rate (Completed)   CRP High sensitivity (Completed)   CK (Creatine Kinase) (Completed)   Magnesium (Completed)    Other Visit Diagnoses    Hyperglycemia       Relevant Orders   Hemoglobin A1c (Completed)      Follow-up: Return in about 4 months (around 07/22/2020).   I,Alexis Bryant,acting as a Education administrator for Penni Homans, MD.,have documented all relevant documentation on the behalf of Penni Homans, MD,as directed by  Penni Homans, MD while in the presence of Penni Homans, MD.  Medical screening examination/treatment was performed by qualified clinical staff member and as supervising physician I was immediately available for consultation/collaboration. I have reviewed documentation and agree with assessment and plan.  Penni Homans, MD

## 2020-03-31 DIAGNOSIS — M791 Myalgia, unspecified site: Secondary | ICD-10-CM | POA: Insufficient documentation

## 2020-03-31 DIAGNOSIS — M6281 Muscle weakness (generalized): Secondary | ICD-10-CM | POA: Insufficient documentation

## 2020-03-31 NOTE — Assessment & Plan Note (Signed)
Encouraged heart healthy diet, increase exercise, avoid trans fats, consider a krill oil cap daily 

## 2020-03-31 NOTE — Assessment & Plan Note (Signed)
With muscle weakness and elevated sed rate and CRP. Will give a medrol dosepak and consider Rheumatology referral after that.

## 2020-03-31 NOTE — Assessment & Plan Note (Signed)
hgba1c acceptable, minimize simple carbs. Increase exercise as tolerated.  

## 2020-04-10 ENCOUNTER — Other Ambulatory Visit: Payer: Self-pay | Admitting: Family Medicine

## 2020-04-10 DIAGNOSIS — Z1231 Encounter for screening mammogram for malignant neoplasm of breast: Secondary | ICD-10-CM

## 2020-04-17 ENCOUNTER — Ambulatory Visit: Payer: Self-pay

## 2020-04-17 ENCOUNTER — Encounter: Payer: Self-pay | Admitting: Internal Medicine

## 2020-04-17 ENCOUNTER — Ambulatory Visit: Payer: Medicare Other | Admitting: Internal Medicine

## 2020-04-17 ENCOUNTER — Other Ambulatory Visit: Payer: Self-pay

## 2020-04-17 VITALS — BP 135/85 | HR 76 | Ht 69.0 in | Wt 234.2 lb

## 2020-04-17 DIAGNOSIS — M791 Myalgia, unspecified site: Secondary | ICD-10-CM

## 2020-04-17 DIAGNOSIS — M25562 Pain in left knee: Secondary | ICD-10-CM | POA: Diagnosis not present

## 2020-04-17 DIAGNOSIS — M25552 Pain in left hip: Secondary | ICD-10-CM

## 2020-04-17 DIAGNOSIS — G8929 Other chronic pain: Secondary | ICD-10-CM | POA: Diagnosis not present

## 2020-04-17 DIAGNOSIS — M545 Low back pain, unspecified: Secondary | ICD-10-CM

## 2020-04-17 DIAGNOSIS — M5459 Other low back pain: Secondary | ICD-10-CM

## 2020-04-17 NOTE — Progress Notes (Signed)
Office Visit Note  Patient: Terri Mckee             Date of Birth: August 04, 1940           MRN: [id number]             PCP: [name], MD Referring: [name], MD Visit Date: 04/17/2020   Subjective:  New Patient (Initial Visit) (Patient complains of low back (left sided), left leg and hip (groin) pain. Patient feels as if sometimes her leg is going to give out. Patient recently finished steroid and noticed no improvement with symptoms. )   History of Present Illness: Terri Mckee is a 80 y.o. female here for evaluation of bilateral lower body pains with elevated sedimentation rate and CRP. Pain was progressive in onset and bothers her mostly when standing up, bending backwards, or trying to lift. She has had chronic pain in  The hips and the knees but now feels pain that localizes to the left thigh in muscles area not just to joint. Previous xrays of bilateral knees showed tricompartmental OA. She also takes chronic low dose pain medication. She was prescribed a course of prednisone this had minimal effect on symptoms.  Activities of Daily Living:  Patient reports morning stiffness for 0 minutes.   Patient Denies nocturnal pain.  Difficulty dressing/grooming: Denies Difficulty climbing stairs: Reports Difficulty getting out of chair: Reports Difficulty using hands for taps, buttons, cutlery, and/or writing: Denies  Review of Systems  Constitutional: Negative for fatigue.  HENT: Negative for mouth sores, mouth dryness and nose dryness.   Eyes: Negative for pain, itching, visual disturbance and dryness.  Respiratory: Positive for shortness of breath and difficulty breathing. Negative for cough and hemoptysis.   Cardiovascular: Negative for chest pain, palpitations and swelling in legs/feet.  Gastrointestinal: Negative for abdominal pain, blood in stool, constipation and diarrhea.  Endocrine: Negative for increased urination.  Genitourinary: Negative for  painful urination.  Musculoskeletal: Positive for arthralgias, joint pain, muscle weakness and morning stiffness. Negative for joint swelling, myalgias, muscle tenderness and myalgias.  Skin: Negative for color change, rash and redness.  Allergic/Immunologic: Negative for susceptible to infections.  Neurological: Negative for dizziness, numbness, headaches, memory loss and weakness.  Hematological: Negative for swollen glands.  Psychiatric/Behavioral: Positive for sleep disturbance. Negative for confusion.    PMFS History:  Patient Active Problem List   Diagnosis Date Noted  . Pain in left knee 04/17/2020  . Muscle weakness 03/31/2020  . Myalgia 03/31/2020  . 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 Class 1 obesity with serious comorbidity and body mass index (BMI) of 31.0 to 31.9 in adult 03/16/2018  . Muscle cramp, nocturnal 06/14/2017  . Preventative health care 04/05/2016  . Vitamin D deficiency 04/03/2016  . Cataracts, bilateral 07/25/2015  . Left shoulder pain 01/29/2015  . Breast cancer screening 09/02/2014  . Decreased visual acuity 09/02/2014  . Hearing loss 09/02/2014  . Left hip pain 09/02/2014  . Medicare annual wellness visit, subsequent 09/02/2014  . Osteopenia 08/17/2014  . Right knee pain 09/24/2013  . Low back pain 12/03/2012  . Skin lesion 07/10/2012  . Urinary incontinence 07/10/2012  . Cervical cancer screening 07/07/2012  . Cough 04/01/2012  . Hoarseness of voice 03/13/2012  . Cervical radiculopathy 12/03/2011  . GERD 09/13/2009  . BREAST MASS, LEFT 02/22/2009  . Pain in both knees 12/21/2008  . 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 Hyperlipidemia, mixed 10/28/2007  . INSOMNIA 10/28/2007  . Allergic rhinitis 07/01/2007  .  WRIST PAIN, RIGHT 03/21/2007  . Muscle spasm of left lower extremity 03/21/2007  . Essential hypertension 08/06/2006  . 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 Abnormal finding of kidney    horse shoe kidney with  3 renal arteries  . Acute upper respiratory infection 04/09/2014  . Anemia   . Arthritis    back  . Back pain   . 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 Cataract   . Cataracts, bilateral 07/25/2015  . Cervical cancer screening 07/07/2012  . Colon polyps   . Elevated serum creatinine    with ACE inhibitor (Normal MRA of renal arteries)  . GERD (gastroesophageal reflux disease)   . History of cyst of breast   . Hoarseness of voice 03/13/2012  . Hypertension   . Lactose intolerance   . Medicare annual wellness visit, subsequent 09/02/2014  . Muscle spasm of left lower extremity 03/21/2007   Qualifier: Diagnosis of  By: Wynona Luna   . Osteoarthritis   . Osteopenia 08/17/2014  . Preventative health care 04/05/2016  . Shortness of breath   . Skin lesion 07/10/2012  . Urinary incontinence 07/10/2012  . Vitamin D deficiency 04/03/2016    Family History  Problem Relation Age of Onset  . Brain cancer Mother        deceased age 37  . Alcohol abuse Mother   . Obesity Mother   . Other Brother        died of sepsis  . Gout Brother   . Alcohol abuse Father   . Cancer Sister        throat  . Pulmonary embolism Daughter   . Mental illness Daughter        anxiety  . Diabetes Maternal Aunt   . Diabetes Maternal Aunt   . Seizures Maternal Aunt   . Birth defects Maternal Aunt   . Lupus Grandchild   . Sickle cell anemia Grandchild   . Colon cancer Neg Hx   . Breast cancer Neg Hx    Past Surgical History:  Procedure Laterality Date  . APPENDECTOMY    . BREAST CYST EXCISION Bilateral    right breast, twice  . OVARY SURGERY     right ovary & tube replacement  . WRIST GANGLION EXCISION     rt   Social History   Social History Narrative   Former Smoker - quit 20 years ago (1/2 ppd x 40 years)   Widowed   1 Daughter        Immunization History  Administered Date(s) Administered  . PFIZER(Purple Top)SARS-COV-2 Vaccination 01/27/2019, 02/17/2019,  10/18/2019  . Pneumococcal Conjugate-13 09/21/2013  . Pneumococcal Polysaccharide-23 10/31/2010, 10/05/2016  . Td 07/25/2015     Objective: Vital Signs: BP 135/85 (BP Location: Right Arm, Patient Position: Sitting, Cuff Size: Normal)   Pulse 76   Ht 5' 9"  (1.753 m)   Wt 234 lb 3.2 oz (106.2 kg)   LMP  (LMP Unknown)   BMI 34.59 kg/m    Physical Exam Constitutional:      Appearance: She is obese.  HENT:     Mouth/Throat:     Mouth: Mucous membranes are moist.     Pharynx: Oropharynx is clear.  Eyes:     Conjunctiva/sclera: Conjunctivae normal.  Cardiovascular:     Rate and Rhythm: Normal rate and regular rhythm.  Pulmonary:     Effort: Pulmonary effort is normal.  Breath sounds: Normal breath sounds.  Skin:    General: Skin is warm and dry.     Findings: No rash.  Neurological:     General: No focal deficit present.     Mental Status: She is alert.  Psychiatric:        Mood and Affect: Mood normal.     Musculoskeletal Exam:  Neck full ROM no tenderness Shoulders full ROM no tenderness or swelling Elbows full ROM no tenderness or swelling Wrists full ROM no tenderness or swelling Fingers full ROM no tenderness or swelling Heberdons nodes throughout fingers with some deviations No paraspinal tenderness to palpation over upper and lower back Hip internal rotation significantly reduced b/l and groin pain provoked with Pace maneuver and internal rotation Knees full ROM crepitus b/l, medial joint line tenderness to palpation, no laxity to varus or valgus pressure Ankles full ROM no tenderness or swelling   Investigation: No additional findings.  Imaging: XR HIPS BILAT W OR W/O PELVIS 2V  Result Date: 04/17/2020 Xray b/l hips and pelvis 3 views Severe degenerative arthritis in bilateral hips with near total loss of joint space and significant sclerosis. Irregular sclerosis at SI joints with narrowing superiorly. Visualized lumbar vertebrae with a mild scoliosis or  lateral listhesis and osteophytes present no ankylosis or obvious fractures. Impression Severe bilateral hip OA, degenerative low back and SI joint changes  XR KNEE 3 VIEW LEFT  Result Date: 04/17/2020 Xray left knee 3 views Medial joint space narrowing no osteophytes no sclerosis. Some probable narrowing on medial edge of patellofemoral space. Bone mineralization appears normal. Impression Moderately advanced medial compartment OA, probably mild involvement in other compartments   Recent Labs: Lab Results  Component Value Date   WBC 6.1 03/28/2020   HGB 14.0 03/28/2020   PLT 194.0 03/28/2020   NA 142 03/28/2020   K 4.5 03/28/2020   CL 105 03/28/2020   CO2 27 03/28/2020   GLUCOSE 82 03/28/2020   BUN 17 03/28/2020   CREATININE 1.19 03/28/2020   BILITOT 0.5 03/28/2020   ALKPHOS 57 03/28/2020   AST 13 03/28/2020   ALT 11 03/28/2020   PROT 7.0 03/28/2020   ALBUMIN 3.9 03/28/2020   CALCIUM 9.1 03/28/2020   GFRAA >60 02/26/2018    Speciality Comments: No specialty comments available.  Procedures:  No procedures performed Allergies: Allegra [fexofenadine], Other, Erythromycin, Oxycodone-acetaminophen, Propoxyphene n-acetaminophen, and Zocor [simvastatin]   Assessment / Plan:     Visit Diagnoses: Left hip pain - Plan: XR HIPS BILAT W OR W/O PELVIS 2V, Sedimentation rate, Aldolase  Hip pain seems to be true joint disease with location and diminished ROM. Will check hip and pelvis xrays. Checking ESR and aldolase, CK was already normal at outside lab and ESR borderline with the question of thigh myalgia pain. This could be referred from severe joint arthritis, also could have some muscle atrophy developing if severe OA.  Chronic pain of left knee - Plan: XR KNEE 3 VIEW LEFT, Sedimentation rate, Aldolase  Workup as above, pain also localizing to anterior and lateral thigh.  Low back pain, unspecified back pain laterality, unspecified chronicity, unspecified whether sciatica  present  Chronic low back pain is is very low on exam no radiation of symptoms sounds discogenic possibly. I do not believe it sounds likely to be causing significant impingement or myelopathy to the thigh.  Orders: Orders Placed This Encounter  Procedures  . XR HIPS BILAT W OR W/O PELVIS 2V  . XR KNEE 3 VIEW LEFT  .  Sedimentation rate  . Aldolase   No orders of the defined types were placed in this encounter.   Follow-Up Instructions: Return in about 2 weeks (around 05/01/2020) for New pt f/u hip and knee pain.   Collier Salina, MD  Note - This record has been created using [employer].  Chart creation errors have been sought, but may not always  have been located. Such creation errors do not reflect on  the standard of medical care.

## 2020-04-17 NOTE — Patient Instructions (Addendum)
Aldolase Test Why am I having this test? An aldolase test is used to help diagnose and monitor disorders that affect the muscles or liver. What is being tested? This test measures aldolase in your blood. Aldolase is a natural chemical (enzyme) that is found in most tissues of the body. It helps to convert blood sugar (glucose) into energy. The amount of aldolase in the blood increases when there is muscle or liver damage. Many different medicines can also cause increased levels of this enzyme. What kind of sample is taken? A blood sample is required for this test. It is usually collected by inserting a needle into a blood vessel.   How do I prepare for this test?  Follow instructions from your health care provider about eating or drinking restrictions. You may need to stop eating or drinking (may need to fast) for a short period of time before the test to get more accurate results.  On the day before the test and the day of the test, avoid activities and exercises that take a lot of effort. Tell a health care provider about:  All medicines you are taking, including vitamins, herbs, eye drops, creams, and over-the-counter medicines.  Any medical conditions you have. How are the results reported? Your test results will be reported as values. Your health care provider will compare your results to normal ranges that were established after testing a large group of people (reference ranges). Reference ranges may vary among labs and hospitals. For this test, common reference ranges are:  Adult: 1-7.5 units/L.  Children: ? Newborn to 30 days: 6-32 units/L. ? Age 26 month to 6 years: 3-12 units/L. ? Age 44-17 years: 3.3-9.7 units/L. What do the results mean? If results are within the reference range, they are considered normal.  Increased levels of aldolase may be a sign of:  A muscular disease, such as muscular dystrophy, dermatomyositis, or polymyositis.  Muscle injury.  Liver  disease.  Heart attack.  Anemia.  An infection, such as trichinosis or mononucleosis. Decreased levels of aldolase may be a sign of:  Muscle-wasting diseases.  Being unable to absorb a natural sugar that is in things like fruit, fruit juices, and honey (hereditary fructose intolerance).  Late-stage muscular dystrophy (MD). Talk with your health care provider about what your results mean. Questions to ask your health care provider Ask your health care provider, or the department that is doing the test:  When will my results be ready?  How will I get my results?  What are my treatment options?  What other tests do I need?  What are my next steps? Summary  An aldolase test is used to help diagnose and monitor disorders that affect the muscles or liver.  Aldolase is a natural chemical (enzyme) that is found in most tissues of the body. The amount of aldolase in the blood increases when there is muscle or liver damage.  For this test, a blood sample is usually collected by inserting a needle into a blood vessel.  Talk with your health care provider about what your results mean.    Erythrocyte Sedimentation Rate Test Why am I having this test? The erythrocyte sedimentation rate (ESR) test is used to help find illnesses related to:  Sudden (acute) or long-term (chronic) infections.  Inflammation.  The body's disease-fighting system attacking healthy cells (autoimmune diseases).  Cancer.  Tissue death. If you have symptoms that may be related to any of these illnesses, your health care provider may do  an ESR test before doing more specific tests. If you have an inflammatory immune disease, such as rheumatoid arthritis, you may have this test to help monitor your therapy. What is being tested? This test measures how long it takes for your red blood cells (erythrocytes) to settle in a solution over a certain amount of time (sedimentation rate). When you have an infection  or inflammation, your red blood cells clump together and settle faster. The sedimentation rate provides information about how much inflammation is present in the body. What kind of sample is taken? A blood sample is required for this test. It is usually collected by inserting a needle into a blood vessel.   How do I prepare for this test? Follow any instructions from your health care provider about changing or stopping your regular medicines. Tell a health care provider about:  Any allergies you have.  All medicines you are taking, including vitamins, herbs, eye drops, creams, and over-the-counter medicines.  Any blood disorders you have.  Any surgeries you have had.  Any medical conditions you have, such as thyroid or kidney disease.  Whether you are pregnant or may be pregnant. How are the results reported? Your results will be reported as a value that measures sedimentation rate in millimeters per hour (mm/hr). Your health care provider will compare your results to normal ranges that were established after testing a large group of people (reference values). Reference values may vary among labs and hospitals. For this test, common reference values, which vary by age and gender, are:  Newborn: 0-2 mm/hr.  Child, up to puberty: 0-10 mm/hr.  Female: ? Under 50 years: 0-20 mm/hr. ? 50-85 years: 0-30 mm/hr. ? Over 85 years: 0-42 mm/hr.  Female: ? Under 50 years: 0-15 mm/hr. ? 50-85 years: 0-20 mm/hr. ? Over 85 years: 0-30 mm/hr. Certain conditions or medicines may cause ESR levels to be falsely lower or higher, such as:  Pregnancy.  Obesity.  Steroids, birth control pills, and blood thinners.  Thyroid or kidney disease. What do the results mean? Results that are within reference values are considered normal, meaning that the level of inflammation in your body is healthy. High ESR levels mean that there is inflammation in your body. You will have more tests to help make a  diagnosis. Inflammation may result from many different conditions or injuries. Talk with your health care provider about what your results mean. Questions to ask your health care provider Ask your health care provider, or the department that is doing the test:  When will my results be ready?  How will I get my results?  What are my treatment options?  What other tests do I need?  What are my next steps? Summary  The erythrocyte sedimentation rate (ESR) test is used to help find illnesses associated with sudden (acute) or long-term (chronic) infections, inflammation, autoimmune diseases, cancer, or tissue death.  If you have symptoms that may be related to any of these illnesses, your health care provider may do an ESR test before doing more specific tests. If you have an inflammatory immune disease, such as rheumatoid arthritis, you may have this test to help monitor your therapy.  This test measures how long it takes for your red blood cells (erythrocytes) to settle in a solution over a certain amount of time (sedimentation rate). This provides information about how much inflammation is present in the body. This information is not intended to replace advice given to you by your health care provider.  Make sure you discuss any questions you have with your health care provider. Document Revised: 09/01/2019 Document Reviewed: 09/01/2019 Elsevier Patient Education  [place].

## 2020-04-18 LAB — ALDOLASE: Aldolase: 4.1 U/L (ref <=8.1)

## 2020-04-18 LAB — SEDIMENTATION RATE: Sed Rate: 43 mm/h — ABNORMAL HIGH (ref 0–30)

## 2020-04-26 LAB — HM DIABETES EYE EXAM

## 2020-05-01 ENCOUNTER — Ambulatory Visit: Payer: Medicare Other | Admitting: Internal Medicine

## 2020-05-01 ENCOUNTER — Other Ambulatory Visit: Payer: Self-pay

## 2020-05-01 ENCOUNTER — Encounter: Payer: Self-pay | Admitting: Internal Medicine

## 2020-05-01 VITALS — BP 138/88 | HR 67 | Ht 69.0 in | Wt 234.2 lb

## 2020-05-01 DIAGNOSIS — G8929 Other chronic pain: Secondary | ICD-10-CM

## 2020-05-01 DIAGNOSIS — M25562 Pain in left knee: Secondary | ICD-10-CM

## 2020-05-01 DIAGNOSIS — M25552 Pain in left hip: Secondary | ICD-10-CM | POA: Diagnosis not present

## 2020-05-01 DIAGNOSIS — M791 Myalgia, unspecified site: Secondary | ICD-10-CM

## 2020-05-01 NOTE — Progress Notes (Signed)
Office Visit Note  Patient: Terri Mckee             Date of Birth: 22-Sep-1940           MRN: [id number]             PCP: [name], MD Referring: [name], MD Visit Date: 05/01/2020   Subjective:  Follow-up (Patient complains of left leg weakness and pain. )   History of Present Illness: Terri Mckee is a 80 y.o. female here for follow up for arthritis and myalgias worse at the hips thighs and knees from initial visit with x-rays demonstrating severe bilateral hip osteoarthritis.  She had some elevation in inflammatory markers but normal CK and objectively normal strength on physical exam check aldolase was normal and ESR not significantly elevated for age and sex. Since the last visit she continues having similar pain in the left hip and thigh worst with standing and walking and feels weakness in her left thigh. She takes tylenol arthritis or tylenol with codeine which is partially helpful.   Review of Systems  Constitutional: Negative for fatigue.  HENT: Negative for mouth sores, mouth dryness and nose dryness.   Eyes: Positive for itching and dryness. Negative for pain and visual disturbance.  Respiratory: Positive for shortness of breath and difficulty breathing. Negative for cough and hemoptysis.   Cardiovascular: Negative for chest pain, palpitations and swelling in legs/feet.  Gastrointestinal: Negative for abdominal pain, blood in stool, constipation and diarrhea.  Endocrine: Negative for increased urination.  Genitourinary: Negative for painful urination.  Musculoskeletal: Positive for arthralgias, joint pain and muscle weakness. Negative for joint swelling, myalgias, morning stiffness, muscle tenderness and myalgias.  Skin: Negative for color change, rash and redness.  Allergic/Immunologic: Negative for susceptible to infections.  Neurological: Negative for dizziness, numbness, headaches, memory loss and weakness.  Hematological: Negative for  swollen glands.  Psychiatric/Behavioral: Positive for sleep disturbance. Negative for confusion.    PMFS History:  Patient Active Problem List   Diagnosis Date Noted  . Pain in left knee 04/17/2020  . Muscle weakness 03/31/2020  . Myalgia 03/31/2020  . 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 Class 1 obesity with serious comorbidity and body mass index (BMI) of 31.0 to 31.9 in adult 03/16/2018  . Muscle cramp, nocturnal 06/14/2017  . Preventative health care 04/05/2016  . Vitamin D deficiency 04/03/2016  . Cataracts, bilateral 07/25/2015  . Left shoulder pain 01/29/2015  . Breast cancer screening 09/02/2014  . Decreased visual acuity 09/02/2014  . Hearing loss 09/02/2014  . Left hip pain 09/02/2014  . Medicare annual wellness visit, subsequent 09/02/2014  . Osteopenia 08/17/2014  . Right knee pain 09/24/2013  . Low back pain 12/03/2012  . Skin lesion 07/10/2012  . Urinary incontinence 07/10/2012  . Cervical cancer screening 07/07/2012  . Cough 04/01/2012  . Hoarseness of voice 03/13/2012  . Cervical radiculopathy 12/03/2011  . GERD 09/13/2009  . BREAST MASS, LEFT 02/22/2009  . Pain in both knees 12/21/2008  . 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 Hyperlipidemia, mixed 10/28/2007  . INSOMNIA 10/28/2007  . Allergic rhinitis 07/01/2007  . WRIST PAIN, RIGHT 03/21/2007  . Muscle spasm of left lower extremity 03/21/2007  . Essential hypertension 08/06/2006  . 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 Acute upper respiratory infection 04/09/2014  . Anemia   . Arthritis  back  . Back pain   . 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 Cataract   . Cataracts, bilateral 07/25/2015  . Cervical cancer screening 07/07/2012  . Colon polyps   . Elevated serum creatinine    with ACE inhibitor (Normal MRA of renal arteries)  . GERD (gastroesophageal reflux disease)   .  History of cyst of breast   . Hoarseness of voice 03/13/2012  . Hypertension   . Lactose intolerance   . Medicare annual wellness visit, subsequent 09/02/2014  . Muscle spasm of left lower extremity 03/21/2007   Qualifier: Diagnosis of  By: Wynona Luna   . Osteoarthritis   . Osteopenia 08/17/2014  . Preventative health care 04/05/2016  . Shortness of breath   . Skin lesion 07/10/2012  . Urinary incontinence 07/10/2012  . Vitamin D deficiency 04/03/2016    Family History  Problem Relation Age of Onset  . Brain cancer Mother        deceased age 66  . Alcohol abuse Mother   . Obesity Mother   . Other Brother        died of sepsis  . Gout Brother   . Alcohol abuse Father   . Cancer Sister        throat  . Pulmonary embolism Daughter   . Mental illness Daughter        anxiety  . Diabetes Maternal Aunt   . Diabetes Maternal Aunt   . Seizures Maternal Aunt   . Birth defects Maternal Aunt   . Lupus Grandchild   . Sickle cell anemia Grandchild   . Colon cancer Neg Hx   . Breast cancer Neg Hx    Past Surgical History:  Procedure Laterality Date  . APPENDECTOMY    . BREAST CYST EXCISION Bilateral    right breast, twice  . OVARY SURGERY     right ovary & tube replacement  . WRIST GANGLION EXCISION     rt   Social History   Social History Narrative   Former Smoker - quit 20 years ago (1/2 ppd x 40 years)   Widowed   1 Daughter        Immunization History  Administered Date(s) Administered  . PFIZER(Purple Top)SARS-COV-2 Vaccination 01/27/2019, 02/17/2019, 10/18/2019  . Pneumococcal Conjugate-13 09/21/2013  . Pneumococcal Polysaccharide-23 10/31/2010, 10/05/2016  . Td 07/25/2015     Objective: Vital Signs: BP 138/88 (BP Location: Left Arm, Patient Position: Sitting, Cuff Size: Normal)   Pulse 67   Ht 5' 9" (1.753 m)   Wt 234 lb 3.2 oz (106.2 kg)   LMP  (LMP Unknown)   BMI 34.59 kg/m    Physical Exam Constitutional:      Appearance: She is obese.  Skin:     General: Skin is warm and dry.     Findings: No rash.  Neurological:     Mental Status: She is alert.     Motor: No weakness.    Musculoskeletal Exam:  Hip internal rotation reduced bilaterally with anterior groin pain Knees crepitus and joint line tenderness bilaterally without swelling and ROM is fairly normal  Investigation: No additional findings.  Imaging: XR HIPS BILAT W OR W/O PELVIS 2V  Result Date: 04/17/2020 Xray b/l hips and pelvis 3 views Severe degenerative arthritis in bilateral hips with near total loss of joint space and significant sclerosis. Irregular sclerosis at SI joints with narrowing superiorly. Visualized lumbar vertebrae with a mild scoliosis or lateral  listhesis and osteophytes present no ankylosis or obvious fractures. Impression Severe bilateral hip OA, degenerative low back and SI joint changes  XR KNEE 3 VIEW LEFT  Result Date: 04/17/2020 Xray left knee 3 views Medial joint space narrowing no osteophytes no sclerosis. Some probable narrowing on medial edge of patellofemoral space. Bone mineralization appears normal. Impression Moderately advanced medial compartment OA, probably mild involvement in other compartments   Recent Labs: Lab Results  Component Value Date   WBC 6.1 03/28/2020   HGB 14.0 03/28/2020   PLT 194.0 03/28/2020   NA 142 03/28/2020   K 4.5 03/28/2020   CL 105 03/28/2020   CO2 27 03/28/2020   GLUCOSE 82 03/28/2020   BUN 17 03/28/2020   CREATININE 1.19 03/28/2020   BILITOT 0.5 03/28/2020   ALKPHOS 57 03/28/2020   AST 13 03/28/2020   ALT 11 03/28/2020   PROT 7.0 03/28/2020   ALBUMIN 3.9 03/28/2020   CALCIUM 9.1 03/28/2020   GFRAA >60 02/26/2018    Speciality Comments: No specialty comments available.  Procedures:  No procedures performed Allergies: Allegra [fexofenadine], Other, Erythromycin, Oxycodone-acetaminophen, Propoxyphene n-acetaminophen, and Zocor [simvastatin]   Assessment / Plan:     Visit Diagnoses: Left hip  pain - Plan: Ambulatory referral to Orthopedic Surgery  Hip pain with OA of the hip on xray and reduced ROM seems to be the main source of symptoms. I recommend consultation with orthopedic surgery, for possible joint injection or discussed of treatment options or advanced imaging.  Chronic pain of left knee - Plan: Ambulatory referral to Orthopedic Surgery  Osteoarthritis of the left knee worst in medial compartment and could also be referred to thigh but ROM is less impacted than hip currently.  Myalgia  Thigh pain seems to be referred from hip and knee OA or maybe biomechanical issue related to these.  Orders: Orders Placed This Encounter  Procedures  . Ambulatory referral to Orthopedic Surgery   No orders of the defined types were placed in this encounter.   Follow-Up Instructions: Return if symptoms worsen or fail to improve.   Collier Salina, MD  Note - This record has been created using [employer].  Chart creation errors have been sought, but may not always  have been located. Such creation errors do not reflect on  the standard of medical care.

## 2020-05-01 NOTE — Patient Instructions (Signed)
I am placing referral to orthopedic surgery clinic to follow up with you and recommend a trial of hip injection treatment I believe the left thigh pain and weakness is related to the severe arthritis in hip and knee.  I do not see evidence of an inflammation or autoimmune cause of muscle weakness.

## 2020-05-13 ENCOUNTER — Encounter: Payer: Self-pay | Admitting: Family Medicine

## 2020-05-13 ENCOUNTER — Ambulatory Visit (INDEPENDENT_AMBULATORY_CARE_PROVIDER_SITE_OTHER): Payer: Medicare Other | Admitting: Family Medicine

## 2020-05-13 ENCOUNTER — Other Ambulatory Visit: Payer: Self-pay

## 2020-05-13 VITALS — BP 116/62 | HR 97 | Temp 98.1°F | Resp 20 | Ht 69.0 in | Wt 235.8 lb

## 2020-05-13 DIAGNOSIS — R7982 Elevated C-reactive protein (CRP): Secondary | ICD-10-CM

## 2020-05-13 DIAGNOSIS — E782 Mixed hyperlipidemia: Secondary | ICD-10-CM

## 2020-05-13 DIAGNOSIS — I10 Essential (primary) hypertension: Secondary | ICD-10-CM

## 2020-05-13 DIAGNOSIS — R700 Elevated erythrocyte sedimentation rate: Secondary | ICD-10-CM

## 2020-05-13 DIAGNOSIS — E559 Vitamin D deficiency, unspecified: Secondary | ICD-10-CM

## 2020-05-13 DIAGNOSIS — Z0000 Encounter for general adult medical examination without abnormal findings: Secondary | ICD-10-CM

## 2020-05-13 DIAGNOSIS — M8580 Other specified disorders of bone density and structure, unspecified site: Secondary | ICD-10-CM

## 2020-05-13 NOTE — Patient Instructions (Signed)
Preventive Care 61 Years and Older, Female Preventive care refers to lifestyle choices and visits with your health care provider that can promote health and wellness. This includes:  A yearly physical exam. This is also called an annual wellness visit.  Regular dental and eye exams.  Immunizations.  Screening for certain conditions.  Healthy lifestyle choices, such as: ? Eating a healthy diet. ? Getting regular exercise. ? Not using drugs or products that contain nicotine and tobacco. ? Limiting alcohol use. What can I expect for my preventive care visit? Physical exam Your health care provider will check your:  Height and weight. These may be used to calculate your BMI (body mass index). BMI is a measurement that tells if you are at a healthy weight.  Heart rate and blood pressure.  Body temperature.  Skin for abnormal spots. Counseling Your health care provider may ask you questions about your:  Past medical problems.  Family's medical history.  Alcohol, tobacco, and drug use.  Emotional well-being.  Home life and relationship well-being.  Sexual activity.  Diet, exercise, and sleep habits.  History of falls.  Memory and ability to understand (cognition).  Work and work Statistician.  Pregnancy and menstrual history.  Access to firearms. What immunizations do I need? Vaccines are usually given at various ages, according to a schedule. Your health care provider will recommend vaccines for you based on your age, medical history, and lifestyle or other factors, such as travel or where you work.   What tests do I need? Blood tests  Lipid and cholesterol levels. These may be checked every 5 years, or more often depending on your overall health.  Hepatitis C test.  Hepatitis B test. Screening  Lung cancer screening. You may have this screening every year starting at age 80 if you have a 30-pack-year history of smoking and currently smoke or have quit within  the past 15 years.  Colorectal cancer screening. ? All adults should have this screening starting at age 44 and continuing until age 80. ? Your health care provider may recommend screening at age 80 if you are at increased risk. ? You will have tests every 1-10 years, depending on your results and the type of screening test.  Diabetes screening. ? This is done by checking your blood sugar (glucose) after you have not eaten for a while (fasting). ? You may have this done every 1-3 years.  Mammogram. ? This may be done every 1-2 years. ? Talk with your health care provider about how often you should have regular mammograms.  Abdominal aortic aneurysm (AAA) screening. You may need this if you are a current or former smoker.  BRCA-related cancer screening. This may be done if you have a family history of breast, ovarian, tubal, or peritoneal cancers. Other tests  STD (sexually transmitted disease) testing, if you are at risk.  Bone density scan. This is done to screen for osteoporosis. You may have this done starting at age 80. Talk with your health care provider about your test results, treatment options, and if necessary, the need for more tests. Follow these instructions at home: Eating and drinking  Eat a diet that includes fresh fruits and vegetables, whole grains, lean protein, and low-fat dairy products. Limit your intake of foods with high amounts of sugar, saturated fats, and salt.  Take vitamin and mineral supplements as recommended by your health care provider.  Do not drink alcohol if your health care provider tells you not to drink.  If you drink alcohol: ? Limit how much you have to 0-1 drink a day. ? Be aware of how much alcohol is in your drink. In the [place] one drink equals one 12 oz bottle of beer (355 mL), one 5 oz glass of wine (148 mL), or one 1 oz glass of hard liquor (44 mL).   Lifestyle  Take daily care of your teeth and gums. Brush your teeth every morning  and night with fluoride toothpaste. Floss one time each day.  Stay active. Exercise for at least 30 minutes 5 or more days each week.  Do not use any products that contain nicotine or tobacco, such as cigarettes, e-cigarettes, and chewing tobacco. If you need help quitting, ask your health care provider.  Do not use drugs.  If you are sexually active, practice safe sex. Use a condom or other form of protection in order to prevent STIs (sexually transmitted infections).  Talk with your health care provider about taking a low-dose aspirin or statin.  Find healthy ways to cope with stress, such as: ? Meditation, yoga, or listening to music. ? Journaling. ? Talking to a trusted person. ? Spending time with friends and family. Safety  Always wear your seat belt while driving or riding in a vehicle.  Do not drive: ? If you have been drinking alcohol. Do not ride with someone who has been drinking. ? When you are tired or distracted. ? While texting.  Wear a helmet and other protective equipment during sports activities.  If you have firearms in your house, make sure you follow all gun safety procedures. What's next?  Visit your health care provider once a year for an annual wellness visit.  Ask your health care provider how often you should have your eyes and teeth checked.  Stay up to date on all vaccines. This information is not intended to replace advice given to you by your health care provider. Make sure you discuss any questions you have with your health care provider. Document Revised: 12/20/2019 Document Reviewed: 12/23/2017 Elsevier Patient Education  2021 [employer]  

## 2020-05-13 NOTE — Assessment & Plan Note (Signed)
Encouraged heart healthy diet, increase exercise, avoid trans fats, consider a krill oil cap daily 

## 2020-05-13 NOTE — Assessment & Plan Note (Signed)
Supplement and monitor 

## 2020-05-13 NOTE — Assessment & Plan Note (Signed)
Encouraged to get adequate exercise, calcium and vitamin d intake 

## 2020-05-13 NOTE — Assessment & Plan Note (Addendum)
Patient encouraged to maintain heart healthy diet, regular exercise, adequate sleep. Consider daily probiotics. Take medications as prescribed. Labs reviewed. Colonoscopy las in 2013, she is asymptomatic and she is now 81 so she will unlikely need another colonoscopy. She has aged out of pap smear screening as well. Will repeat MGM in next 6 months and Dexa scan next year.

## 2020-05-13 NOTE — Assessment & Plan Note (Signed)
Well controlled, no changes to meds. Encouraged heart healthy diet such as the DASH diet and exercise as tolerated.  °

## 2020-05-13 NOTE — Progress Notes (Signed)
Acute Office Visit  Subjective:    Patient ID: Terri Mckee, female    DOB: 10-Mar-1940, 80 y.o.   MRN: [id number]  Chief Complaint  Patient presents with  . Annual Exam    Pt has no concerns or problems.    HPI Patient is in today for annual preventative exam and follow up on chronic medical concerns including back pain with bilateral leg weakness, hypertension, hyperglycemia and more. She is following ortho and they have an MRI scheduled for later in the month to evaluate her lower back. They believe that is what is causing her pain and weakness. No recent fall or trauma. No incontinence. She has had a good deal of stress managing her husband's poor health but she is trying to take care of herself also now. Denies CP/palp/SOB/HA/congestion/fevers/GI or GU c/o. Taking meds as prescribed  Past Medical History:  Diagnosis Date  . 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 Acute upper respiratory infection 04/09/2014  . Anemia   . Arthritis    back  . Back pain   . 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 Cataract   . Cataracts, bilateral 07/25/2015  . Cervical cancer screening 07/07/2012  . Colon polyps   . Elevated serum creatinine    with ACE inhibitor (Normal MRA of renal arteries)  . GERD (gastroesophageal reflux disease)   . History of cyst of breast   . Hoarseness of voice 03/13/2012  . Hypertension   . Lactose intolerance   . Medicare annual wellness visit, subsequent 09/02/2014  . Muscle spasm of left lower extremity 03/21/2007   Qualifier: Diagnosis of  By: Wynona Luna   . Osteoarthritis   . Osteopenia 08/17/2014  . Preventative health care 04/05/2016  . Shortness of breath   . Skin lesion 07/10/2012  . Urinary incontinence 07/10/2012  . Vitamin D deficiency 04/03/2016    Past Surgical History:  Procedure Laterality Date  . APPENDECTOMY    . BREAST CYST EXCISION Bilateral    right breast, twice  . OVARY  SURGERY     right ovary & tube replacement  . WRIST GANGLION EXCISION     rt    Family History  Problem Relation Age of Onset  . Brain cancer Mother        deceased age 45  . Alcohol abuse Mother   . Obesity Mother   . Other Brother        died of sepsis  . Gout Brother   . Alcohol abuse Father   . Cancer Sister        throat  . Pulmonary embolism Daughter   . Mental illness Daughter        anxiety  . Cancer Daughter        uterine?  . Diabetes Maternal Aunt   . Diabetes Maternal Aunt   . Seizures Maternal Aunt   . Birth defects Maternal Aunt   . Lupus Grandchild   . Sickle cell anemia Grandchild   . Colon cancer Neg Hx   . Breast cancer Neg Hx     Social History   Socioeconomic History  . Marital status: Married    Spouse name: Shanon Brow  . Number of children: Not on file  . Years of education: Not on file  . Highest education level: Not on file  Occupational History  . Occupation: retired  Tobacco Use  .  Smoking status: Former Smoker    Quit date: 01/13/1991    Years since quitting: 29.3  . Smokeless tobacco: Never Used  . Tobacco comment: quit 20 years ago 1/2 ppd x 40 years  Vaping Use  . Vaping Use: Never used  Substance and Sexual Activity  . Alcohol use: No  . Drug use: No  . Sexual activity: Never    Comment: lives with husband, no dietary restrictions,   Other Topics Concern  . Not on file  Social History Narrative   Former Smoker - quit 20 years ago (1/2 ppd x 40 years)   Widowed   1 Daughter        Social Determinants of Radio broadcast assistant Strain: Low Risk   . Difficulty of Paying Living Expenses: Not hard at all  Food Insecurity: No Food Insecurity  . Worried About Charity fundraiser in the Last Year: Never true  . Ran Out of Food in the Last Year: Never true  Transportation Needs: No Transportation Needs  . Lack of Transportation (Medical): No  . Lack of Transportation (Non-Medical): No  Physical Activity: Inactive  . Days of  Exercise per Week: 0 days  . Minutes of Exercise per Session: 0 min  Stress: No Stress Concern Present  . Feeling of Stress : Not at all  Social Connections: Moderately Integrated  . Frequency of Communication with Friends and Family: More than three times a week  . Frequency of Social Gatherings with Friends and Family: More than three times a week  . Attends Religious Services: More than 4 times per year  . Active Member of Clubs or Organizations: No  . Attends Archivist Meetings: Never  . Marital Status: Married  Human resources officer Violence: Not At Risk  . Fear of Current or Ex-Partner: No  . Emotionally Abused: No  . Physically Abused: No  . Sexually Abused: No    Outpatient Medications Prior to Visit  Medication Sig Dispense Refill  . acetaminophen-codeine (TYLENOL #3) 300-30 MG tablet Take 1-2 tablets by mouth every 4 (four) hours as needed for moderate pain or severe pain. 40 tablet 0  . albuterol (VENTOLIN HFA) 108 (90 Base) MCG/ACT inhaler Inhale 2 puffs into the lungs every 6 (six) hours as needed for wheezing or shortness of breath. 18 g 5  . aspirin 81 MG tablet Take 81 mg by mouth daily.    . Biotin w/ Vitamins C & E (HAIR/SKIN/NAILS PO) Take by mouth.    . calcium carbonate (OS-CAL) 600 MG TABS Take 600 mg by mouth daily.    . Cholecalciferol (VITAMIN D3 PO) Take by mouth.    . dicyclomine (BENTYL) 20 MG tablet Take 1 tablet (20 mg total) by mouth 3 (three) times daily as needed for spasms. 20 tablet 0  . famotidine (PEPCID) 20 MG tablet TAKE 1 TABLET(20 MG) BY MOUTH TWICE DAILY 180 tablet 1  . felodipine (PLENDIL) 10 MG 24 hr tablet Take 1 tablet (10 mg total) by mouth daily. 90 tablet 1  . Garlic 384 MG CAPS Take 1 capsule by mouth daily.    Javier Docker Oil 1000 MG CAPS Take 1,000 capsules by mouth daily.    . Multiple Vitamin (MULTIVITAMIN) tablet Take 1 tablet by mouth daily.    . Omega-3 Fatty Acids (OMEGA 3 PO) Take by mouth.    . SYMBICORT 80-4.5 MCG/ACT  inhaler Inhale 2 puffs into the lungs in the morning and at bedtime. 10.2 g 5  .  XYZAL 5 MG tablet Take 1 tablet (5 mg total) by mouth every evening. 90 tablet 3  . HYDROcodone-homatropine (HYCODAN) 5-1.5 MG/5ML syrup Take 5 mLs by mouth every 4 (four) hours as needed for cough. (Patient not taking: No sig reported) 240 mL 0  . predniSONE (DELTASONE) 10 MG tablet Take by mouth.    . methylPREDNISolone (MEDROL) 4 MG tablet 5 tab po qd X 1d then 4 tab po qd X 1d then 3 tab po qd X 1d then 2 tab po qd then 1 tab po qd (Patient not taking: No sig reported) 15 tablet 0   No facility-administered medications prior to visit.    Allergies  Allergen Reactions  . Allegra [Fexofenadine] Shortness Of Breath  . Other     Flu shot, high fever, mental status changes, vomiting x twice  . Erythromycin Nausea Only  . Oxycodone-Acetaminophen Nausea And Vomiting  . Propoxyphene N-Acetaminophen Nausea And Vomiting      severe vomiting  . Zocor [Simvastatin] Other (See Comments)      Myalgia    Review of Systems  Constitutional: Positive for fatigue. Negative for chills and fever.  HENT: Negative for congestion and hearing loss.   Eyes: Negative for discharge.  Respiratory: Negative for cough and shortness of breath.   Cardiovascular: Negative for chest pain, palpitations and leg swelling.  Gastrointestinal: Negative for abdominal pain, blood in stool, constipation, diarrhea, nausea and vomiting.  Genitourinary: Negative for dysuria, frequency, hematuria and urgency.  Musculoskeletal: Positive for arthralgias, back pain and myalgias.  Skin: Negative for rash.  Allergic/Immunologic: Negative for environmental allergies.  Neurological: Negative for dizziness, weakness and headaches.  Hematological: Does not bruise/bleed easily.  Psychiatric/Behavioral: Negative for suicidal ideas. The patient is not nervous/anxious.        Objective:    Physical Exam Vitals and nursing note reviewed.   Constitutional:      General: She is not in acute distress.    Appearance: She is well-developed.  HENT:     Head: Normocephalic and atraumatic.     Right Ear: Tympanic membrane, ear canal and external ear normal.     Left Ear: Tympanic membrane, ear canal and external ear normal.     Nose: Nose normal.  Eyes:     General:        Right eye: No discharge.        Left eye: No discharge.     Extraocular Movements: Extraocular movements intact.     Pupils: Pupils are equal, round, and reactive to light.  Cardiovascular:     Rate and Rhythm: Normal rate and regular rhythm.     Heart sounds: No murmur heard.   Pulmonary:     Effort: Pulmonary effort is normal.     Breath sounds: Normal breath sounds.  Abdominal:     General: Bowel sounds are normal.     Palpations: Abdomen is soft. There is no mass.     Tenderness: There is no abdominal tenderness.  Musculoskeletal:     Cervical back: Normal range of motion and neck supple.     Right lower leg: No edema.     Left lower leg: No edema.  Skin:    General: Skin is warm and dry.  Neurological:     Mental Status: She is alert and oriented to person, place, and time.     Cranial Nerves: No cranial nerve deficit.     Sensory: No sensory deficit.  Psychiatric:  Behavior: Behavior normal.     BP 116/62   Pulse 97   Temp 98.1 F (36.7 C)   Resp 20   Ht 5\' 9"  (1.753 m)   Wt 235 lb 12.8 oz (107 kg)   LMP  (LMP Unknown)   SpO2 98%   BMI 34.82 kg/m  Wt Readings from Last 3 Encounters:  05/13/20 235 lb 12.8 oz (107 kg)  05/01/20 234 lb 3.2 oz (106.2 kg)  04/17/20 234 lb 3.2 oz (106.2 kg)    Health Maintenance Due  Topic Date Due  . COLONOSCOPY (Pts 45-32yrs Insurance coverage will need to be confirmed)  10/05/2016    There are no preventive care reminders to display for this patient.   Lab Results  Component Value Date   TSH 2.87 03/28/2020   Lab Results  Component Value Date   WBC 6.1 03/28/2020   HGB 14.0  03/28/2020   HCT 41.2 03/28/2020   MCV 86.8 03/28/2020   PLT 194.0 03/28/2020   Lab Results  Component Value Date   NA 142 03/28/2020   K 4.5 03/28/2020   CO2 27 03/28/2020   GLUCOSE 82 03/28/2020   BUN 17 03/28/2020   CREATININE 1.19 03/28/2020   BILITOT 0.5 03/28/2020   ALKPHOS 57 03/28/2020   AST 13 03/28/2020   ALT 11 03/28/2020   PROT 7.0 03/28/2020   ALBUMIN 3.9 03/28/2020   CALCIUM 9.1 03/28/2020   ANIONGAP 7 02/26/2018   GFR 43.30 (L) 03/28/2020   Lab Results  Component Value Date   CHOL 194 03/28/2020   Lab Results  Component Value Date   HDL 42.60 03/28/2020   Lab Results  Component Value Date   LDLCALC 137 (H) 03/28/2020   Lab Results  Component Value Date   TRIG 73.0 03/28/2020   Lab Results  Component Value Date   CHOLHDL 5 03/28/2020   Lab Results  Component Value Date   HGBA1C 5.4 03/28/2020       Assessment & Plan:   Problem List Items Addressed This Visit    Hyperlipidemia, mixed    Encouraged heart healthy diet, increase exercise, avoid trans fats, consider a krill oil cap daily      Essential hypertension    Well controlled, no changes to meds. Encouraged heart healthy diet such as the DASH diet and exercise as tolerated.       Osteopenia    Encouraged to get adequate exercise, calcium and vitamin d intake      Vitamin D deficiency    Supplement and monitor      Preventative health care    Patient encouraged to maintain heart healthy diet, regular exercise, adequate sleep. Consider daily probiotics. Take medications as prescribed. Labs reviewed. Colonoscopy las in 2013, she is asymptomatic and she is now 64 so she will unlikely need another colonoscopy. She has aged out of pap smear screening as well. Will repeat MGM in next 6 months and Dexa scan next year.        Other Visit Diagnoses    Elevated sed rate    -  Primary   Relevant Orders   Sedimentation rate   CRP High sensitivity   CRP elevated       Relevant Orders    Sedimentation rate   CRP High sensitivity       No orders of the defined types were placed in this encounter.    Penni Homans, MD

## 2020-05-14 LAB — SEDIMENTATION RATE: Sed Rate: 19 mm/hr (ref 0–30)

## 2020-05-14 LAB — HIGH SENSITIVITY CRP: CRP, High Sensitivity: 9.95 mg/L — ABNORMAL HIGH (ref 0.000–5.000)

## 2020-05-31 ENCOUNTER — Ambulatory Visit: Payer: Medicare Other

## 2020-06-18 ENCOUNTER — Other Ambulatory Visit: Payer: Self-pay

## 2020-06-18 ENCOUNTER — Ambulatory Visit (HOSPITAL_BASED_OUTPATIENT_CLINIC_OR_DEPARTMENT_OTHER)
Admission: RE | Admit: 2020-06-18 | Discharge: 2020-06-18 | Disposition: A | Discharge status: Home / Self Care | Payer: Medicare Other | Source: Ambulatory Visit | Attending: Family Medicine | Admitting: Family Medicine

## 2020-06-18 DIAGNOSIS — Z780 Asymptomatic menopausal state: Secondary | ICD-10-CM | POA: Insufficient documentation

## 2020-07-12 ENCOUNTER — Other Ambulatory Visit: Payer: Self-pay

## 2020-07-12 ENCOUNTER — Ambulatory Visit
Admission: RE | Admit: 2020-07-12 | Discharge: 2020-07-12 | Disposition: A | Discharge status: Home / Self Care | Payer: Medicare Other | Source: Ambulatory Visit | Attending: Family Medicine | Admitting: Family Medicine

## 2020-07-12 DIAGNOSIS — Z1231 Encounter for screening mammogram for malignant neoplasm of breast: Secondary | ICD-10-CM

## 2020-08-27 ENCOUNTER — Ambulatory Visit (INDEPENDENT_AMBULATORY_CARE_PROVIDER_SITE_OTHER): Payer: Medicare Other | Admitting: Family Medicine

## 2020-08-27 ENCOUNTER — Other Ambulatory Visit: Payer: Self-pay

## 2020-08-27 VITALS — BP 130/74 | HR 63 | Temp 98.6°F | Resp 16 | Wt 233.4 lb

## 2020-08-27 DIAGNOSIS — R7982 Elevated C-reactive protein (CRP): Secondary | ICD-10-CM | POA: Diagnosis not present

## 2020-08-27 DIAGNOSIS — Z634 Disappearance and death of family member: Secondary | ICD-10-CM

## 2020-08-27 DIAGNOSIS — E782 Mixed hyperlipidemia: Secondary | ICD-10-CM | POA: Diagnosis not present

## 2020-08-27 DIAGNOSIS — M25552 Pain in left hip: Secondary | ICD-10-CM | POA: Diagnosis not present

## 2020-08-27 DIAGNOSIS — F4321 Adjustment disorder with depressed mood: Secondary | ICD-10-CM

## 2020-08-27 DIAGNOSIS — G4700 Insomnia, unspecified: Secondary | ICD-10-CM

## 2020-08-27 DIAGNOSIS — I10 Essential (primary) hypertension: Secondary | ICD-10-CM | POA: Diagnosis not present

## 2020-08-27 DIAGNOSIS — Q631 Lobulated, fused and horseshoe kidney: Secondary | ICD-10-CM

## 2020-08-27 DIAGNOSIS — M25551 Pain in right hip: Secondary | ICD-10-CM | POA: Diagnosis not present

## 2020-08-27 DIAGNOSIS — E559 Vitamin D deficiency, unspecified: Secondary | ICD-10-CM

## 2020-08-27 LAB — CBC
HCT: 40.3 % (ref 36.0–46.0)
Hemoglobin: 13.3 g/dL (ref 12.0–15.0)
MCHC: 32.9 g/dL (ref 30.0–36.0)
MCV: 88.8 fl (ref 78.0–100.0)
Platelets: 185 10*3/uL (ref 150.0–400.0)
RBC: 4.54 Mil/uL (ref 3.87–5.11)
RDW: 14.1 % (ref 11.5–15.5)
WBC: 5.9 10*3/uL (ref 4.0–10.5)

## 2020-08-27 LAB — COMPREHENSIVE METABOLIC PANEL
ALT: 13 U/L (ref 0–35)
AST: 13 U/L (ref 0–37)
Albumin: 4 g/dL (ref 3.5–5.2)
Alkaline Phosphatase: 53 U/L (ref 39–117)
BUN: 20 mg/dL (ref 6–23)
CO2: 28 mEq/L (ref 19–32)
Calcium: 9.7 mg/dL (ref 8.4–10.5)
Chloride: 105 mEq/L (ref 96–112)
Creatinine, Ser: 1.23 mg/dL — ABNORMAL HIGH (ref 0.40–1.20)
GFR: 41.49 mL/min — ABNORMAL LOW (ref >60.00)
Glucose, Bld: 78 mg/dL (ref 70–99)
Potassium: 4.8 mEq/L (ref 3.5–5.1)
Sodium: 142 mEq/L (ref 135–145)
Total Bilirubin: 0.5 mg/dL (ref 0.2–1.2)
Total Protein: 7.1 g/dL (ref 6.0–8.3)

## 2020-08-27 LAB — LIPID PANEL
Cholesterol: 193 mg/dL (ref 0–200)
HDL: 43.2 mg/dL (ref >39.00)
LDL Cholesterol: 129 mg/dL — ABNORMAL HIGH (ref 0–99)
NonHDL: 150.17
Total CHOL/HDL Ratio: 4
Triglycerides: 106 mg/dL (ref 0.0–149.0)
VLDL: 21.2 mg/dL (ref 0.0–40.0)

## 2020-08-27 LAB — HIGH SENSITIVITY CRP: CRP, High Sensitivity: 21.2 mg/L — ABNORMAL HIGH (ref 0.000–5.000)

## 2020-08-27 LAB — TSH: TSH: 2.35 u[IU]/mL (ref 0.35–5.50)

## 2020-08-27 LAB — SEDIMENTATION RATE: Sed Rate: 45 mm/hr — ABNORMAL HIGH (ref 0–30)

## 2020-08-27 NOTE — Progress Notes (Signed)
Patient ID: Terri Mckee, female    DOB: 10-Jun-1940  Age: 80 y.o. MRN: [id number]    Subjective:  Subjective  HPI Terri Mckee presents for office visit today for follow up on htn and hyperlipidemia. She reports that she has a horseshoe kidney and that she was told she is not viable for a kidney transplant. Denies CP/palp/SOB/HA/congestion/fevers/GI or GU c/o. Taking meds as prescribed.  She is still dealing with muscle pain local to her hips. Her orthopedics is Dr. Gladstone Lighter at [facility]. She reports that she does not feel pain when laying down or sitting, and only feels it when standing or walking. She describes the pain as aching. She tried applying tolica creams, but she did not notice a big difference. She takes 650 mg of tylenol 4x daily. She reports that the muscle pain in her hips and shoulders have started after she took her covid shots. She reports that weakness accompanies her bilateral hip pain. She states that she had to quit her job due to the severity of the pain.  Review of Systems  Constitutional:  Negative for chills, fatigue and fever.  HENT:  Negative for congestion, rhinorrhea, sinus pressure, sinus pain, sore throat and trouble swallowing.   Eyes:  Negative for pain.  Respiratory:  Negative for cough and shortness of breath.   Cardiovascular:  Negative for chest pain, palpitations and leg swelling.  Gastrointestinal:  Negative for abdominal pain, blood in stool, diarrhea, nausea and vomiting.  Genitourinary:  Negative for decreased urine volume, flank pain, frequency, vaginal bleeding and vaginal discharge.  Musculoskeletal:  Positive for arthralgias and myalgias (bilateral hips). Negative for back pain.  Neurological:  Positive for weakness. Negative for headaches.   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   Acute upper respiratory infection 04/09/2014   Anemia     Arthritis    back   Back pain    Carpal tunnel syndrome    right wrist carpal tunnel syndrome   Cataract    Cataracts, bilateral 07/25/2015   Cervical cancer screening 07/07/2012   Colon polyps    Elevated serum creatinine    with ACE inhibitor (Normal MRA of renal arteries)   GERD (gastroesophageal reflux disease)    History of cyst of breast    Hoarseness of voice 03/13/2012   Hypertension    Lactose intolerance    Medicare annual wellness visit, subsequent 09/02/2014   Muscle spasm of left lower extremity 03/21/2007   Qualifier: Diagnosis of  By: Wynona Luna    Osteoarthritis    Osteopenia 08/17/2014   Preventative health care 04/05/2016   Shortness of breath    Skin lesion 07/10/2012   Urinary incontinence 07/10/2012   Vitamin D deficiency 04/03/2016    She has a past surgical history that includes Ovary surgery; Wrist ganglion excision; Appendectomy; and Breast cyst excision (Bilateral).   Her family history includes Alcohol abuse in her father and mother; Birth defects in her maternal aunt; Brain cancer in her mother; Cancer in her daughter and sister; Diabetes in her maternal aunt and maternal aunt; Gout in her brother; Lupus in her grandchild; Mental illness in her daughter; Obesity in her mother; Other in her brother; Pulmonary embolism in her daughter; Seizures in her maternal aunt; Sickle cell anemia in her grandchild.She reports that she quit smoking about 29 years ago. She has never used smokeless tobacco. She  reports that she does not drink alcohol and does not use drugs.  Current Outpatient Medications on File Prior to Visit  Medication Sig Dispense Refill   acetaminophen-codeine (TYLENOL #3) 300-30 MG tablet Take 1-2 tablets by mouth every 4 (four) hours as needed for moderate pain or severe pain. 40 tablet 0   albuterol (VENTOLIN HFA) 108 (90 Base) MCG/ACT inhaler Inhale 2 puffs into the lungs every 6 (six) hours as needed for wheezing or shortness of breath. 18 g 5    aspirin 81 MG tablet Take 81 mg by mouth daily.     Biotin w/ Vitamins C & E (HAIR/SKIN/NAILS PO) Take by mouth.     calcium carbonate (OS-CAL) 600 MG TABS Take 600 mg by mouth daily.     Cholecalciferol (VITAMIN D3 PO) Take by mouth.     dicyclomine (BENTYL) 20 MG tablet Take 1 tablet (20 mg total) by mouth 3 (three) times daily as needed for spasms. 20 tablet 0   famotidine (PEPCID) 20 MG tablet TAKE 1 TABLET(20 MG) BY MOUTH TWICE DAILY 180 tablet 1   felodipine (PLENDIL) 10 MG 24 hr tablet Take 1 tablet (10 mg total) by mouth daily. 90 tablet 1   Garlic [id number] MG CAPS Take 1 capsule by mouth daily.     Krill Oil 1000 MG CAPS Take 1,000 capsules by mouth daily.     Multiple Vitamin (MULTIVITAMIN) tablet Take 1 tablet by mouth daily.     Omega-3 Fatty Acids (OMEGA 3 PO) Take by mouth.     predniSONE (DELTASONE) 10 MG tablet Take by mouth.     SYMBICORT 80-4.5 MCG/ACT inhaler Inhale 2 puffs into the lungs in the morning and at bedtime. 10.2 g 5   XYZAL 5 MG tablet Take 1 tablet (5 mg total) by mouth every evening. 90 tablet 3   HYDROcodone-homatropine (HYCODAN) 5-1.5 MG/5ML syrup Take 5 mLs by mouth every 4 (four) hours as needed for cough. (Patient not taking: No sig reported) 240 mL 0   No current facility-administered medications on file prior to visit.     Objective:  Objective  Physical Exam Constitutional:      General: She is not in acute distress.    Appearance: Normal appearance. She is not ill-appearing or toxic-appearing.  HENT:     Head: Normocephalic and atraumatic.     Right Ear: Tympanic membrane, ear canal and external ear normal.     Left Ear: Tympanic membrane, ear canal and external ear normal.     Nose: No congestion or rhinorrhea.  Eyes:     Extraocular Movements: Extraocular movements intact.     Pupils: Pupils are equal, round, and reactive to light.  Cardiovascular:     Rate and Rhythm: Normal rate and regular rhythm.     Pulses: Normal pulses.     Heart  sounds: Normal heart sounds. No murmur heard. Pulmonary:     Effort: Pulmonary effort is normal. No respiratory distress.     Breath sounds: Normal breath sounds. No wheezing, rhonchi or rales.  Abdominal:     General: Bowel sounds are normal.     Palpations: Abdomen is soft. There is no mass.     Tenderness: no abdominal tenderness There is no guarding.     Hernia: No hernia is present.  Musculoskeletal:        General: Normal range of motion.     Cervical back: Normal range of motion and neck supple.  Skin:    General: Skin  is warm and dry.  Neurological:     Mental Status: She is alert and oriented to person, place, and time.  Psychiatric:        Behavior: Behavior normal.   BP 130/74   Pulse 63   Temp 98.6 F (37 C)   Resp 16   Wt 233 lb 6.4 oz (105.9 kg)   LMP  (LMP Unknown)   SpO2 98%   BMI 34.47 kg/m  Wt Readings from Last 3 Encounters:  08/27/20 233 lb 6.4 oz (105.9 kg)  05/13/20 235 lb 12.8 oz (107 kg)  05/01/20 234 lb 3.2 oz (106.2 kg)     Lab Results  Component Value Date   WBC 5.9 08/27/2020   HGB 13.3 08/27/2020   HCT 40.3 08/27/2020   PLT 185.0 08/27/2020   GLUCOSE 78 08/27/2020   CHOL 193 08/27/2020   TRIG 106.0 08/27/2020   HDL 43.20 08/27/2020   LDLCALC 129 (H) 08/27/2020   ALT 13 08/27/2020   AST 13 08/27/2020   NA 142 08/27/2020   K 4.8 08/27/2020   CL 105 08/27/2020   CREATININE 1.23 (H) 08/27/2020   BUN 20 08/27/2020   CO2 28 08/27/2020   TSH 2.35 08/27/2020   HGBA1C 5.4 03/28/2020    MM 3D SCREEN BREAST BILATERAL  Result Date: 07/17/2020 CLINICAL DATA:  Screening. EXAM: DIGITAL SCREENING BILATERAL MAMMOGRAM WITH TOMOSYNTHESIS AND CAD TECHNIQUE: Bilateral screening digital craniocaudal and mediolateral oblique mammograms were obtained. Bilateral screening digital breast tomosynthesis was performed. The images were evaluated with computer-aided detection. COMPARISON:  Previous exam(s). ACR Breast Density Category b: There are scattered  areas of fibroglandular density. FINDINGS: There are no findings suspicious for malignancy. IMPRESSION: No mammographic evidence of malignancy. A result letter of this screening mammogram will be mailed directly to the patient. RECOMMENDATION: Screening mammogram in one year. (Code:SM-B-01Y) BI-RADS CATEGORY  1: Negative. Electronically Signed   By: Evangeline Dakin M.D.   On: 07/17/2020 13:56     Assessment & Plan:  Plan    No orders of the defined types were placed in this encounter.   Problem List Items Addressed This Visit     Hyperlipidemia, mixed    Encourage heart healthy diet such as MIND or DASH diet, increase exercise, avoid trans fats, simple carbohydrates and processed foods, consider a krill or fish or flaxseed oil cap daily.       Relevant Orders   Lipid panel (Completed)   Essential hypertension    Well controlled, no changes to meds. Encouraged heart healthy diet such as the DASH diet and exercise as tolerated.       Relevant Orders   CBC (Completed)   Comprehensive metabolic panel (Completed)   TSH (Completed)   Horseshoe kidney    Hydrate and monitor      INSOMNIA   Bilateral hip pain - Primary    Stay active, minimize processed foods and simple carbs.       Relevant Orders   Sedimentation rate (Completed)   CRP High sensitivity (Completed)   Vitamin D deficiency    Supplement and monitor      CRP elevated    Monitor and check sed rate as well. Minimize simple carbs and processed foods, stay as active as able      Relevant Orders   Sedimentation rate (Completed)   CRP High sensitivity (Completed)   Grief at loss of child    Their daughter has died from cancer recently and they are very sad. They  have good support so she feels she is managing adequately. She will let us know if she needs further intervention       Follow-up: Return in about 3 months (around 11/27/2020).  I, Suezanne Jacquet, acting as a scribe for Penni Homans, MD, have documented  all relevent documentation on behalf of Penni Homans, MD, as directed by Penni Homans, MD while in the presence of Penni Homans, MD.  I, [name], MD personally performed the services described in this documentation. All medical record entries made by the scribe were at my direction and in my presence. I have reviewed the chart and agree that the record reflects my personal performance and is accurate and complete

## 2020-08-27 NOTE — Patient Instructions (Addendum)
Turmeric/Curcurmen capsules help with pain or inflammation take every day. Over the counter 60-80 ounces of water Walk if able Protein in diet every 4 hours  Aspercreme topically or Lidocaine topically Muscle Pain, Adult Muscle pain, also called myalgia, is a condition in which a person has pain in one or more muscles in the body. Muscle pain may be mild, moderate, or severe. It may feel sharp, achy, or burning. In most cases, the pain lasts only a shorttime and goes away without treatment. Muscle pain can result from using muscles in a new or different way or after a period of inactivity. It is normal to feel some muscle pain after starting anexercise program. Muscles that have not been used often will be sore at first. What are the causes? This condition is caused by using muscles in a new or different way after a period of inactivity. Other causes may include: Overuse or muscle strain, especially if you are not in shape. This is the most common cause of muscle pain. Injury or bruising. Infectious diseases, including diseases caused by viruses, such as the flu (influenza). Fibromyalgia.This is a long-term, or chronic, condition that causes muscle tenderness, tiredness (fatigue), and headache. Autoimmune or rheumatologic diseases. These are conditions, such as lupus, in which the body's defense system (immunesystem) attacks areas in the body. Certain medicines, including ACE inhibitors and statins. What are the signs or symptoms? The main symptom of this condition is sore or painful muscles, including duringactivity and when stretching. You may also have slight swelling. How is this diagnosed? This condition is diagnosed with a physical exam. Your health care provider will ask questions about your pain and when it began. If you have not had muscle pain for very long, your health care provider may want to wait before doing much testing. If your muscle pain has lasted a long time, tests may be done  right away. In some cases, this may include tests to rule out certainconditions or illnesses. How is this treated? Treatment for this condition depends on the cause. Home care is often enough to relieve muscle pain. Your health care provider may also prescribe NSAIDs, suchas ibuprofen. Follow these instructions at home: Medicines Take over-the-counter and prescription medicines only as told by your health care provider. Ask your health care provider if the medicine prescribed to you requires you to avoid driving or using machinery. Managing pain, swelling, and discomfort     If directed, put ice on the painful area. To do this: Put ice in a plastic bag. Place a towel between your skin and the bag. Leave the ice on for 20 minutes, 2-3 times a day. For the first 2 days of muscle soreness, or if there is swelling: Do not soak in hot baths. Do not use a hot tub, steam room, sauna, heating pad, or other heat source. After 48-72 hours, you may alternate between applying ice and applying heat as told by your health care provider. If directed, apply heat to the affected area as often as told by your health care provider. Use the heat source that your health care provider recommends, such as a moist heat pack or a heating pad. Place a towel between your skin and the heat source. Leave the heat on for 20-30 minutes. Remove the heat if your skin turns bright red. This is especially important if you are unable to feel pain, heat, or cold. You may have a greater risk of getting burned. If you have an injury, raise (elevate)  the injured area above the level of your heart while you are sitting or lying down. Activity  If overuse is causing your muscle pain: Slow down your activities until the pain goes away. Do regular, gentle exercises if you are not usually active. Warm up before exercising. Stretch before and after exercising. This can help lower the risk of muscle pain. Do not continue working out  if the pain is severe. Severe pain could mean that you have injured a muscle. Do not lift anything that is heavier than 5-10 lb (2.3-4.5 kg), or the limit that you are told, until your health care provider says that it is safe. Return to your normal activities as told by your health care provider. Ask your health care provider what activities are safe for you.  General instructions Do not use any products that contain nicotine or tobacco, such as cigarettes, e-cigarettes, and chewing tobacco. These can delay healing. If you need help quitting, ask your health care provider. Keep all follow-up visits as told by your health care provider. This is important. Contact a health care provider if you have: Muscle pain that gets worse and medicines do not help. Muscle pain that lasts longer than 3 days. A rash or fever along with muscle pain. Muscle pain after a tick bite. Muscle pain while working out, even though you are in good physical condition. Redness, soreness, or swelling along with muscle pain. Muscle pain after starting a new medicine or changing the dose of a medicine. Get help right away if you have: Trouble breathing. Trouble swallowing. Muscle pain along with a stiff neck, fever, and vomiting. Severe muscle weakness or you cannot move part of your body. These symptoms may represent a serious problem that is an emergency. Do not wait to see if the symptoms will go away. Get medical help right away. Call your local emergency services (911 in the [place] Do not drive yourself to the hospital. Summary Muscle pain usually lasts only a short time and goes away without treatment. This condition is caused by using muscles in a new or different way after a period of inactivity. If your muscle pain lasts longer than 3 days, tell your health care provider. This information is not intended to replace advice given to you by your health care provider. Make sure you discuss any questions you have with  your healthcare provider. Document Revised: 10/07/2018 Document Reviewed: 10/07/2018 Elsevier Patient Education  2022 [employer].

## 2020-08-28 ENCOUNTER — Other Ambulatory Visit: Payer: Self-pay

## 2020-08-28 DIAGNOSIS — R700 Elevated erythrocyte sedimentation rate: Secondary | ICD-10-CM

## 2020-08-28 DIAGNOSIS — R7982 Elevated C-reactive protein (CRP): Secondary | ICD-10-CM

## 2020-09-01 DIAGNOSIS — F4321 Adjustment disorder with depressed mood: Secondary | ICD-10-CM | POA: Insufficient documentation

## 2020-09-01 DIAGNOSIS — Z634 Disappearance and death of family member: Secondary | ICD-10-CM | POA: Insufficient documentation

## 2020-09-01 DIAGNOSIS — R7982 Elevated C-reactive protein (CRP): Secondary | ICD-10-CM | POA: Insufficient documentation

## 2020-09-01 NOTE — Assessment & Plan Note (Signed)
Monitor and check sed rate as well. Minimize simple carbs and processed foods, stay as active as able

## 2020-09-01 NOTE — Assessment & Plan Note (Signed)
Supplement and monitor 

## 2020-09-01 NOTE — Assessment & Plan Note (Signed)
Their daughter has died from cancer recently and they are very sad. They have good support so she feels she is managing adequately. She will let us know if she needs further intervention

## 2020-09-01 NOTE — Assessment & Plan Note (Signed)
Hydrate and monitor 

## 2020-09-01 NOTE — Assessment & Plan Note (Signed)
Stay active, minimize processed foods and simple carbs.

## 2020-09-01 NOTE — Assessment & Plan Note (Signed)
Well controlled, no changes to meds. Encouraged heart healthy diet such as the DASH diet and exercise as tolerated.  °

## 2020-09-01 NOTE — Assessment & Plan Note (Signed)
Encourage heart healthy diet such as MIND or DASH diet, increase exercise, avoid trans fats, simple carbohydrates and processed foods, consider a krill or fish or flaxseed oil cap daily.  °

## 2020-09-25 ENCOUNTER — Other Ambulatory Visit: Payer: Self-pay | Admitting: Family Medicine

## 2020-09-25 DIAGNOSIS — J441 Chronic obstructive pulmonary disease with (acute) exacerbation: Secondary | ICD-10-CM

## 2020-09-30 ENCOUNTER — Other Ambulatory Visit (INDEPENDENT_AMBULATORY_CARE_PROVIDER_SITE_OTHER): Payer: Medicare Other

## 2020-09-30 ENCOUNTER — Other Ambulatory Visit: Payer: Self-pay

## 2020-09-30 DIAGNOSIS — R7982 Elevated C-reactive protein (CRP): Secondary | ICD-10-CM | POA: Diagnosis not present

## 2020-09-30 DIAGNOSIS — R700 Elevated erythrocyte sedimentation rate: Secondary | ICD-10-CM

## 2020-09-30 LAB — SEDIMENTATION RATE: Sed Rate: 26 mm/hr (ref 0–30)

## 2020-10-01 LAB — C-REACTIVE PROTEIN: CRP: 1.1 mg/dL (ref 0.5–20.0)

## 2020-12-02 ENCOUNTER — Encounter: Payer: Self-pay | Admitting: Family Medicine

## 2020-12-02 ENCOUNTER — Other Ambulatory Visit: Payer: Self-pay

## 2020-12-02 ENCOUNTER — Ambulatory Visit (INDEPENDENT_AMBULATORY_CARE_PROVIDER_SITE_OTHER): Payer: Medicare Other | Admitting: Family Medicine

## 2020-12-02 DIAGNOSIS — E782 Mixed hyperlipidemia: Secondary | ICD-10-CM

## 2020-12-02 DIAGNOSIS — E559 Vitamin D deficiency, unspecified: Secondary | ICD-10-CM | POA: Diagnosis not present

## 2020-12-02 DIAGNOSIS — M25552 Pain in left hip: Secondary | ICD-10-CM

## 2020-12-02 DIAGNOSIS — I10 Essential (primary) hypertension: Secondary | ICD-10-CM

## 2020-12-02 DIAGNOSIS — E8881 Metabolic syndrome: Secondary | ICD-10-CM | POA: Diagnosis not present

## 2020-12-02 DIAGNOSIS — M25551 Pain in right hip: Secondary | ICD-10-CM

## 2020-12-02 LAB — CBC
HCT: 39.7 % (ref 36.0–46.0)
Hemoglobin: 13.1 g/dL (ref 12.0–15.0)
MCHC: 33.1 g/dL (ref 30.0–36.0)
MCV: 88.7 fl (ref 78.0–100.0)
Platelets: 200 10*3/uL (ref 150.0–400.0)
RBC: 4.48 Mil/uL (ref 3.87–5.11)
RDW: 14.4 % (ref 11.5–15.5)
WBC: 6.4 10*3/uL (ref 4.0–10.5)

## 2020-12-02 LAB — COMPREHENSIVE METABOLIC PANEL
ALT: 13 U/L (ref 0–35)
AST: 14 U/L (ref 0–37)
Albumin: 4.1 g/dL (ref 3.5–5.2)
Alkaline Phosphatase: 53 U/L (ref 39–117)
BUN: 21 mg/dL (ref 6–23)
CO2: 27 mEq/L (ref 19–32)
Calcium: 9.1 mg/dL (ref 8.4–10.5)
Chloride: 104 mEq/L (ref 96–112)
Creatinine, Ser: 1.42 mg/dL — ABNORMAL HIGH (ref 0.40–1.20)
GFR: 34.86 mL/min — ABNORMAL LOW (ref >60.00)
Glucose, Bld: 86 mg/dL (ref 70–99)
Potassium: 3.8 mEq/L (ref 3.5–5.1)
Sodium: 140 mEq/L (ref 135–145)
Total Bilirubin: 0.3 mg/dL (ref 0.2–1.2)
Total Protein: 6.9 g/dL (ref 6.0–8.3)

## 2020-12-02 LAB — LIPID PANEL
Cholesterol: 214 mg/dL — ABNORMAL HIGH (ref 0–200)
HDL: 43.5 mg/dL (ref >39.00)
LDL Cholesterol: 148 mg/dL — ABNORMAL HIGH (ref 0–99)
NonHDL: 170.21
Total CHOL/HDL Ratio: 5
Triglycerides: 113 mg/dL (ref 0.0–149.0)
VLDL: 22.6 mg/dL (ref 0.0–40.0)

## 2020-12-02 LAB — TSH: TSH: 2.2 u[IU]/mL (ref 0.35–5.50)

## 2020-12-02 LAB — VITAMIN D 25 HYDROXY (VIT D DEFICIENCY, FRACTURES): VITD: 38.09 ng/mL (ref 30.00–100.00)

## 2020-12-02 LAB — HEMOGLOBIN A1C: Hgb A1c MFr Bld: 5.4 % (ref 4.6–6.5)

## 2020-12-02 NOTE — Patient Instructions (Addendum)
Shingrix is the new shingles shot, 2 shots over 2-6 months, confirm coverage with insurance and document, then can return here for shots with nurse appt or at pharmacy   Consider new bivalent COVID shot prior to holiday gatherings  Let us know if you are ready for a referral to orthopaedics for evaluation of hips  Wegovy (weekly) or Saxenda (daily) shots for weight loss  Managing Your Hypertension Hypertension, also called high blood pressure, is when the force of the blood pressing against the walls of the arteries is too strong. Arteries are blood vessels that carry blood from your heart throughout your body. Hypertension forces the heart to work harder to pump blood and may cause the arteries to become narrow or stiff. Understanding blood pressure readings Your personal target blood pressure may vary depending on your medical conditions, your age, and other factors. A blood pressure reading includes a higher number over a lower number. Ideally, your blood pressure should be below 120/80. You should know that: The first, or top, number is called the systolic pressure. It is a measure of the pressure in your arteries as your heart beats. The second, or bottom number, is called the diastolic pressure. It is a measure of the pressure in your arteries as the heart relaxes. Blood pressure is classified into four stages. Based on your blood pressure reading, your health care provider may use the following stages to determine what type of treatment you need, if any. Systolic pressure and diastolic pressure are measured in a unit called mmHg. Normal Systolic pressure: below 235. Diastolic pressure: below 80. Elevated Systolic pressure: 361-443. Diastolic pressure: below 80. Hypertension stage 1 Systolic pressure: 154-008. Diastolic pressure: 67-61. Hypertension stage 2 Systolic pressure: 950 or above. Diastolic pressure: 90 or above. How can this condition affect me? Managing your hypertension  is an important responsibility. Over time, hypertension can damage the arteries and decrease blood flow to important parts of the body, including the brain, heart, and kidneys. Having untreated or uncontrolled hypertension can lead to: A heart attack. A stroke. A weakened blood vessel (aneurysm). Heart failure. Kidney damage. Eye damage. Metabolic syndrome. Memory and concentration problems. Vascular dementia. What actions can I take to manage this condition? Hypertension can be managed by making lifestyle changes and possibly by taking medicines. Your health care provider will help you make a plan to bring your blood pressure within a normal range. Nutrition  Eat a diet that is high in fiber and potassium, and low in salt (sodium), added sugar, and fat. An example eating plan is called the Dietary Approaches to Stop Hypertension (DASH) diet. To eat this way: Eat plenty of fresh fruits and vegetables. Try to fill one-half of your plate at each meal with fruits and vegetables. Eat whole grains, such as whole-wheat pasta, brown rice, or whole-grain bread. Fill about one-fourth of your plate with whole grains. Eat low-fat dairy products. Avoid fatty cuts of meat, processed or cured meats, and poultry with skin. Fill about one-fourth of your plate with lean proteins such as fish, chicken without skin, beans, eggs, and tofu. Avoid pre-made and processed foods. These tend to be higher in sodium, added sugar, and fat. Reduce your daily sodium intake. Most people with hypertension should eat less than 1,500 mg of sodium a day. Lifestyle  Work with your health care provider to maintain a healthy body weight or to lose weight. Ask what an ideal weight is for you. Get at least 30 minutes of exercise that causes  your heart to beat faster (aerobic exercise) most days of the week. Activities may include walking, swimming, or biking. Include exercise to strengthen your muscles (resistance exercise), such  as weight lifting, as part of your weekly exercise routine. Try to do these types of exercises for 30 minutes at least 3 days a week. Do not use any products that contain nicotine or tobacco, such as cigarettes, e-cigarettes, and chewing tobacco. If you need help quitting, ask your health care provider. Control any long-term (chronic) conditions you have, such as high cholesterol or diabetes. Identify your sources of stress and find ways to manage stress. This may include meditation, deep breathing, or making time for fun activities. Alcohol use Do not drink alcohol if: Your health care provider tells you not to drink. You are pregnant, may be pregnant, or are planning to become pregnant. If you drink alcohol: Limit how much you use to: 0-1 drink a day for women. 0-2 drinks a day for men. Be aware of how much alcohol is in your drink. In the [place] one drink equals one 12 oz bottle of beer (355 mL), one 5 oz glass of wine (148 mL), or one 1 oz glass of hard liquor (44 mL). Medicines Your health care provider may prescribe medicine if lifestyle changes are not enough to get your blood pressure under control and if: Your systolic blood pressure is 130 or higher. Your diastolic blood pressure is 80 or higher. Take medicines only as told by your health care provider. Follow the directions carefully. Blood pressure medicines must be taken as told by your health care provider. The medicine does not work as well when you skip doses. Skipping doses also puts you at risk for problems. Monitoring Before you monitor your blood pressure: Do not smoke, drink caffeinated beverages, or exercise within 30 minutes before taking a measurement. Use the bathroom and empty your bladder (urinate). Sit quietly for at least 5 minutes before taking measurements. Monitor your blood pressure at home as told by your health care provider. To do this: Sit with your back straight and supported. Place your feet flat on the  floor. Do not cross your legs. Support your arm on a flat surface, such as a table. Make sure your upper arm is at heart level. Each time you measure, take two or three readings one minute apart and record the results. You may also need to have your blood pressure checked regularly by your health care provider. General information Talk with your health care provider about your diet, exercise habits, and other lifestyle factors that may be contributing to hypertension. Review all the medicines you take with your health care provider because there may be side effects or interactions. Keep all visits as told by your health care provider. Your health care provider can help you create and adjust your plan for managing your high blood pressure. Where to find more information National Heart, Lung, and Blood Institute: https://wilson-eaton.com/ American Heart Association: www.heart.org Contact a health care provider if: You think you are having a reaction to medicines you have taken. You have repeated (recurrent) headaches. You feel dizzy. You have swelling in your ankles. You have trouble with your vision. Get help right away if: You develop a severe headache or confusion. You have unusual weakness or numbness, or you feel faint. You have severe pain in your chest or abdomen. You vomit repeatedly. You have trouble breathing. These symptoms may represent a serious problem that is an emergency. Do not wait  to see if the symptoms will go away. Get medical help right away. Call your local emergency services (911 in the [place] Do not drive yourself to the hospital. Summary Hypertension is when the force of blood pumping through your arteries is too strong. If this condition is not controlled, it may put you at risk for serious complications. Your personal target blood pressure may vary depending on your medical conditions, your age, and other factors. For most people, a normal blood pressure is less than  120/80. Hypertension is managed by lifestyle changes, medicines, or both. Lifestyle changes to help manage hypertension include losing weight, eating a healthy, low-sodium diet, exercising more, stopping smoking, and limiting alcohol. This information is not intended to replace advice given to you by your health care provider. Make sure you discuss any questions you have with your health care provider. Document Revised: 01/16/2019 Document Reviewed: 11/29/2018 Elsevier Patient Education  2022 [employer].

## 2020-12-02 NOTE — Assessment & Plan Note (Signed)
Well controlled, no changes to meds. Encouraged heart healthy diet such as the DASH diet and exercise as tolerated.  °

## 2020-12-02 NOTE — Assessment & Plan Note (Signed)
Labs reveal deficiency. Start on Vitamin D 50000 IU caps, 1 cap po weekly x 12 weeks. Disp #4 with 4 rf. Also take daily Vitamin D over the counter. If already taking a daily supplement increase by 1000 IU daily and if not start Vitamin D 2000 IU daily.  

## 2020-12-02 NOTE — Assessment & Plan Note (Signed)
Encourage heart healthy diet such as MIND or DASH diet, increase exercise, avoid trans fats, simple carbohydrates and processed foods, consider a krill or fish or flaxseed oil cap daily.  °

## 2020-12-02 NOTE — Assessment & Plan Note (Addendum)
Encouraged moist heat and gentle stretching as tolerated. May try NSAIDs and prescription meds as directed and report if symptoms worsen or seek immediate care and radiates down side and front of thighs. 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 She is offered referral to ortho for further consideration but she declines for now.

## 2020-12-02 NOTE — Progress Notes (Signed)
Subjective:   By signing my name below, I, Terri Mckee, attest that this documentation has been prepared under the direction and in the presence of Terri Lukes, MD. 12/02/2020     Patient ID: Terri Mckee, female    DOB: 1940-08-21, 80 y.o.   MRN: [id number]  Chief Complaint  Patient presents with   3 months follow up    HPI Patient is in today for an office visit and 3 month f/u.  No recent illnesses or ER visits.  She reports she is experiencing pain in her hips that radiate to the front and side of her thighs and they are worse when she get up and is walking and the pain is throbbing when she lies down at night. She has been using turmeric and several OTC creams but they provide little relief. 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 She was also undergoing physical therapy but it did not help. She uses 2 tablets of tylenol in the morning and at night.   She also reports she has been forgetting things but not the important things like paying bills. She would like to try prevagen.   She has not been eating properly.  She has 3 Covid-19 vaccines at this time. She is not UTD on the shingles vaccine. She is not interested in the flu vaccine.   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   Cataract    Cataracts, bilateral 07/25/2015   Cervical cancer screening 07/07/2012   Colon polyps    Elevated serum creatinine    with ACE inhibitor (Normal MRA of renal arteries)   GERD (gastroesophageal reflux disease)    History of cyst of breast    Hoarseness of voice 03/13/2012   Hypertension    Lactose intolerance    Medicare annual wellness visit, subsequent 09/02/2014   Muscle spasm of left lower  extremity 03/21/2007   Qualifier: Diagnosis of  By: Wynona Luna    Osteoarthritis    Osteopenia 08/17/2014   Preventative health care 04/05/2016   Shortness of breath    Skin lesion 07/10/2012   Urinary incontinence 07/10/2012   Vitamin D deficiency 04/03/2016    Past Surgical History:  Procedure Laterality Date   APPENDECTOMY     BREAST CYST EXCISION Bilateral    right breast, twice   OVARY SURGERY     right ovary & tube replacement   WRIST GANGLION EXCISION     rt    Family History  Problem Relation Age of Onset   Brain cancer Mother        deceased age 69   Alcohol abuse Mother    Obesity Mother    Other Brother        died of sepsis   Gout Brother    Alcohol abuse Father    Cancer Sister        throat   Pulmonary embolism Daughter    Mental illness Daughter        anxiety   Cancer Daughter        uterine?   Diabetes Maternal Aunt    Diabetes Maternal Aunt  Seizures Maternal Aunt    Birth defects Maternal Aunt    Lupus Grandchild    Sickle cell anemia Grandchild    Colon cancer Neg Hx    Breast cancer Neg Hx     Social History   Socioeconomic History   Marital status: Married    Spouse name: Terri Mckee   Number of children: Not on file   Years of education: Not on file   Highest education level: Not on file  Occupational History   Occupation: retired  Tobacco Use   Smoking status: Former    Types: Cigarettes    Quit date: 01/13/1991    Years since quitting: 29.9   Smokeless tobacco: Never   Tobacco comments:    quit 20 years ago 1/2 ppd x 40 years  Vaping Use   Vaping Use: Never used  Substance and Sexual Activity   Alcohol use: No   Drug use: No   Sexual activity: Never    Comment: lives with husband, no dietary restrictions,   Other Topics Concern   Not on file  Social History Narrative   Former Smoker - quit 20 years ago (1/2 ppd x 40 years)   Widowed   1 Daughter        Social Determinants of Radio broadcast assistant Strain: Low  Risk    Difficulty of Paying Living Expenses: Not hard at all  Food Insecurity: No Food Insecurity   Worried About Charity fundraiser in the Last Year: Never true   Arboriculturist in the Last Year: Never true  Transportation Needs: No Transportation Needs   Lack of Transportation (Medical): No   Lack of Transportation (Non-Medical): No  Physical Activity: Inactive   Days of Exercise per Week: 0 days   Minutes of Exercise per Session: 0 min  Stress: No Stress Concern Present   Feeling of Stress : Not at all  Social Connections: Moderately Integrated   Frequency of Communication with Friends and Family: More than three times a week   Frequency of Social Gatherings with Friends and Family: More than three times a week   Attends Religious Services: More than 4 times per year   Active Member of [employer] or Organizations: No   Attends Archivist Meetings: Never   Marital Status: Married  Human resources officer Violence: Not At Risk   Fear of Current or Ex-Partner: No   Emotionally Abused: No   Physically Abused: No   Sexually Abused: No    Outpatient Medications Prior to Visit  Medication Sig Dispense Refill   acetaminophen-codeine (TYLENOL #3) 300-30 MG tablet Take 1-2 tablets by mouth every 4 (four) hours as needed for moderate pain or severe pain. 40 tablet 0   albuterol (VENTOLIN HFA) 108 (90 Base) MCG/ACT inhaler Inhale 2 puffs into the lungs every 6 (six) hours as needed for wheezing or shortness of breath. 18 g 5   aspirin 81 MG tablet Take 81 mg by mouth daily.     Biotin w/ Vitamins C & E (HAIR/SKIN/NAILS PO) Take by mouth.     calcium carbonate (OS-CAL) 600 MG TABS Take 600 mg by mouth daily.     Cholecalciferol (VITAMIN D3 PO) Take by mouth.     dicyclomine (BENTYL) 20 MG tablet Take 1 tablet (20 mg total) by mouth 3 (three) times daily as needed for spasms. 20 tablet 0   famotidine (PEPCID) 20 MG tablet TAKE 1 TABLET(20 MG) BY MOUTH TWICE DAILY 180 tablet 1  felodipine  (PLENDIL) 10 MG 24 hr tablet Take 1 tablet (10 mg total) by mouth daily. 90 tablet 1   Garlic 250 MG CAPS Take 1 capsule by mouth daily.     Krill Oil 1000 MG CAPS Take 1,000 capsules by mouth daily.     Multiple Vitamin (MULTIVITAMIN) tablet Take 1 tablet by mouth daily.     Omega-3 Fatty Acids (OMEGA 3 PO) Take by mouth.     predniSONE (DELTASONE) 10 MG tablet Take by mouth.     SYMBICORT 80-4.5 MCG/ACT inhaler INHALE 2 PUFFS INTO THE LUNGS IN THE MORNING AND AT BEDTIME 10.2 g 5   HYDROcodone-homatropine (HYCODAN) 5-1.5 MG/5ML syrup Take 5 mLs by mouth every 4 (four) hours as needed for cough. (Patient not taking: Reported on 12/02/2020) 240 mL 0   XYZAL 5 MG tablet Take 1 tablet (5 mg total) by mouth every evening. 90 tablet 3   No facility-administered medications prior to visit.    Allergies  Allergen Reactions   Allegra [Fexofenadine] Shortness Of Breath   Other     Flu shot, high fever, mental status changes, vomiting x twice   Erythromycin Nausea Only   Oxycodone-Acetaminophen Nausea And Vomiting   Propoxyphene N-Acetaminophen Nausea And Vomiting      severe vomiting   Zocor [Simvastatin] Other (See Comments)      Myalgia    Review of Systems  Constitutional:  Negative for fever and malaise/fatigue.  HENT:  Negative for congestion.   Eyes:  Negative for redness.  Respiratory:  Negative for shortness of breath.   Cardiovascular:  Negative for chest pain, palpitations and leg swelling.  Gastrointestinal:  Negative for abdominal pain, blood in stool and nausea.  Genitourinary:  Negative for dysuria and frequency.  Musculoskeletal:  Positive for joint pain (hip) and myalgias (front and sides of thighs). Negative for falls.  Skin:  Negative for rash.  Neurological:  Negative for dizziness, loss of consciousness, weakness and headaches.  Endo/Heme/Allergies:  Negative for polydipsia.  Psychiatric/Behavioral:  Positive for memory loss. Negative for depression. The patient is  not nervous/anxious.       Objective:    Physical Exam Constitutional:      General: She is not in acute distress.    Appearance: She is well-developed.  HENT:     Head: Normocephalic and atraumatic.  Eyes:     Conjunctiva/sclera: Conjunctivae normal.  Neck:     Thyroid: No thyromegaly.  Cardiovascular:     Rate and Rhythm: Normal rate and regular rhythm.     Heart sounds: Normal heart sounds. No murmur heard. Pulmonary:     Effort: Pulmonary effort is normal. No respiratory distress.     Breath sounds: Normal breath sounds.  Abdominal:     General: Bowel sounds are normal. There is no distension.     Palpations: Abdomen is soft. There is no mass.     Tenderness: There is no abdominal tenderness.  Musculoskeletal:     Cervical back: Neck supple.  Lymphadenopathy:     Cervical: No cervical adenopathy.  Skin:    General: Skin is warm and dry.  Neurological:     Mental Status: She is alert and oriented to person, place, and time.  Psychiatric:        Behavior: Behavior normal.    BP 122/84   Pulse 92   Temp 97.9 F (36.6 C)   Resp 16   Wt 233 lb 3.2 oz (105.8 kg)   LMP  (LMP Unknown)  SpO2 96%   BMI 34.44 kg/m  Wt Readings from Last 3 Encounters:  12/02/20 233 lb 3.2 oz (105.8 kg)  08/27/20 233 lb 6.4 oz (105.9 kg)  05/13/20 235 lb 12.8 oz (107 kg)    Diabetic Foot Exam - Simple   No data filed    Lab Results  Component Value Date   WBC 5.9 08/27/2020   HGB 13.3 08/27/2020   HCT 40.3 08/27/2020   PLT 185.0 08/27/2020   GLUCOSE 78 08/27/2020   CHOL 193 08/27/2020   TRIG 106.0 08/27/2020   HDL 43.20 08/27/2020   LDLCALC 129 (H) 08/27/2020   ALT 13 08/27/2020   AST 13 08/27/2020   NA 142 08/27/2020   K 4.8 08/27/2020   CL 105 08/27/2020   CREATININE 1.23 (H) 08/27/2020   BUN 20 08/27/2020   CO2 28 08/27/2020   TSH 2.35 08/27/2020   HGBA1C 5.4 03/28/2020    Lab Results  Component Value Date   TSH 2.35 08/27/2020   Lab Results  Component  Value Date   WBC 5.9 08/27/2020   HGB 13.3 08/27/2020   HCT 40.3 08/27/2020   MCV 88.8 08/27/2020   PLT 185.0 08/27/2020   Lab Results  Component Value Date   NA 142 08/27/2020   K 4.8 08/27/2020   CO2 28 08/27/2020   GLUCOSE 78 08/27/2020   BUN 20 08/27/2020   CREATININE 1.23 (H) 08/27/2020   BILITOT 0.5 08/27/2020   ALKPHOS 53 08/27/2020   AST 13 08/27/2020   ALT 13 08/27/2020   PROT 7.1 08/27/2020   ALBUMIN 4.0 08/27/2020   CALCIUM 9.7 08/27/2020   ANIONGAP 7 02/26/2018   GFR 41.49 (L) 08/27/2020   Lab Results  Component Value Date   CHOL 193 08/27/2020   Lab Results  Component Value Date   HDL 43.20 08/27/2020   Lab Results  Component Value Date   LDLCALC 129 (H) 08/27/2020   Lab Results  Component Value Date   TRIG 106.0 08/27/2020   Lab Results  Component Value Date   CHOLHDL 4 08/27/2020   Lab Results  Component Value Date   HGBA1C 5.4 03/28/2020       Assessment & Plan:   Problem List Items Addressed This Visit     Hyperlipidemia, mixed    Encourage heart healthy diet such as MIND or DASH diet, increase exercise, avoid trans fats, simple carbohydrates and processed foods, consider a krill or fish or flaxseed oil cap daily.       Relevant Orders   Lipid panel   Essential hypertension    Well controlled, no changes to meds. Encouraged heart healthy diet such as the DASH diet and exercise as tolerated.       Relevant Orders   CBC   Comprehensive metabolic panel   TSH   Bilateral hip pain    Encouraged moist heat and gentle stretching as tolerated. May try NSAIDs and prescription meds as directed and report if symptoms worsen or seek immediate care and radiates down side and front of thighs. 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 She is offered referral to ortho for further consideration but she declines for now.       Vitamin D deficiency    Labs reveal deficiency. Start on Vitamin D 50000 IU caps, 1 cap po  weekly x 12 weeks. Disp #4 with 4 rf. Also take daily Vitamin D over the counter. If already taking a daily supplement increase by 1000  IU daily and if not start Vitamin D 2000 IU daily.       Relevant Orders   VITAMIN D 25 Hydroxy (Vit-D Deficiency, Fractures)   Insulin resistance    hgba1c acceptable, minimize simple carbs. Increase exercise as tolerated.       Relevant Orders   Hemoglobin A1c    No orders of the defined types were placed in this encounter.   I,Terri Mckee,acting as a Education administrator for Penni Homans, MD.,have documented all relevant documentation on the behalf of Penni Homans, MD,as directed by  Penni Homans, MD while in the presence of Penni Homans, MD.   I, Terri Lukes, MD., personally preformed the services described in this documentation.  All medical record entries made by the scribe were at my direction and in my presence.  I have reviewed the chart and discharge instructions (if applicable) and agree that the record reflects my personal performance and is accurate and complete. 12/02/2020

## 2020-12-02 NOTE — Assessment & Plan Note (Signed)
hgba1c acceptable, minimize simple carbs. Increase exercise as tolerated.  

## 2020-12-09 ENCOUNTER — Other Ambulatory Visit: Payer: Self-pay

## 2020-12-09 DIAGNOSIS — N189 Chronic kidney disease, unspecified: Secondary | ICD-10-CM

## 2020-12-09 DIAGNOSIS — R7989 Other specified abnormal findings of blood chemistry: Secondary | ICD-10-CM

## 2020-12-09 NOTE — Addendum Note (Signed)
Addended by: Randolm Idol A on: 12/09/2020 11:49 AM   Modules accepted: Orders

## 2020-12-31 ENCOUNTER — Other Ambulatory Visit: Payer: Self-pay | Admitting: Family Medicine

## 2020-12-31 DIAGNOSIS — I10 Essential (primary) hypertension: Secondary | ICD-10-CM

## 2021-01-01 ENCOUNTER — Other Ambulatory Visit: Payer: Self-pay | Admitting: Family Medicine

## 2021-01-01 DIAGNOSIS — I10 Essential (primary) hypertension: Secondary | ICD-10-CM

## 2021-01-07 ENCOUNTER — Other Ambulatory Visit: Payer: Self-pay | Admitting: Family Medicine

## 2021-01-09 ENCOUNTER — Telehealth: Payer: Self-pay | Admitting: Family Medicine

## 2021-01-09 NOTE — Telephone Encounter (Signed)
Left message for patient to call back and schedule Medicare Annual Wellness Visit (AWV) in office.   If not able to come in office, please offer to do virtually or by telephone.  Left office number and my jabber [phone].  Last AWV:12/28/2019  Please schedule at anytime with Nurse Health Advisor.

## 2021-01-17 ENCOUNTER — Telehealth: Payer: Self-pay

## 2021-01-17 ENCOUNTER — Ambulatory Visit (INDEPENDENT_AMBULATORY_CARE_PROVIDER_SITE_OTHER): Payer: Medicare Other

## 2021-01-17 VITALS — BP 142/84 | HR 81 | Temp 98.4°F | Resp 16 | Ht 69.0 in | Wt 232.8 lb

## 2021-01-17 DIAGNOSIS — Z0000 Encounter for general adult medical examination without abnormal findings: Secondary | ICD-10-CM | POA: Diagnosis not present

## 2021-01-17 NOTE — Progress Notes (Signed)
Subjective:   Latish Toutant is a 81 y.o. female who presents for Medicare Annual (Subsequent) preventive examination.  Review of Systems     Cardiac Risk Factors include: advanced age (>15men, >14 women);dyslipidemia;hypertension;obesity (BMI >30kg/m2)     Objective:    Today's Vitals   01/17/21 1048 01/17/21 1059  BP: (!) 142/84   Pulse: 81   Resp: 16   Temp: 98.4 F (36.9 C)   SpO2: 98%   Weight: 232 lb 12.8 oz (105.6 kg)   Height: 5\' 9"  (1.753 m)   PainSc:  5    Body mass index is 34.38 kg/m.  Advanced Directives 01/17/2021 12/28/2019 02/14/2019 12/06/2018 02/26/2018 05/24/2015 08/17/2014  Does Patient Have a Medical Advance Directive? No No No No No No No  Does patient want to make changes to medical advance directive? No - Patient declined - - - - - -  Would patient like information on creating a medical advance directive? - No - Patient declined No - Patient declined No - Patient declined - No - patient declined information No - patient declined information    Current Medications (verified) Outpatient Encounter Medications as of 01/17/2021  Medication Sig   acetaminophen-codeine (TYLENOL #3) 300-30 MG tablet Take 1-2 tablets by mouth every 4 (four) hours as needed for moderate pain or severe pain.   albuterol (VENTOLIN HFA) 108 (90 Base) MCG/ACT inhaler Inhale 2 puffs into the lungs every 6 (six) hours as needed for wheezing or shortness of breath.   aspirin 81 MG tablet Take 81 mg by mouth daily.   Biotin w/ Vitamins C & E (HAIR/SKIN/NAILS PO) Take by mouth.   calcium carbonate (OS-CAL) 600 MG TABS Take 600 mg by mouth daily.   Cholecalciferol (VITAMIN D3 PO) Take by mouth.   dicyclomine (BENTYL) 20 MG tablet Take 1 tablet (20 mg total) by mouth 3 (three) times daily as needed for spasms.   famotidine (PEPCID) 20 MG tablet TAKE 1 TABLET(20 MG) BY MOUTH TWICE DAILY   felodipine (PLENDIL) 10 MG 24 hr tablet TAKE 1 TABLET(10 MG) BY MOUTH DAILY   Garlic 761 MG CAPS  Take 1 capsule by mouth daily.   Krill Oil 1000 MG CAPS Take 1,000 capsules by mouth daily.   Multiple Vitamin (MULTIVITAMIN) tablet Take 1 tablet by mouth daily.   Omega-3 Fatty Acids (OMEGA 3 PO) Take by mouth.   predniSONE (DELTASONE) 10 MG tablet Take by mouth.   SYMBICORT 80-4.5 MCG/ACT inhaler INHALE 2 PUFFS INTO THE LUNGS IN THE MORNING AND AT BEDTIME   XYZAL 5 MG tablet Take 1 tablet (5 mg total) by mouth every evening.   HYDROcodone-homatropine (HYCODAN) 5-1.5 MG/5ML syrup Take 5 mLs by mouth every 4 (four) hours as needed for cough. (Patient not taking: Reported on 12/02/2020)   No facility-administered encounter medications on file as of 01/17/2021.    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   Cataract    Cataracts, bilateral 07/25/2015   Cervical cancer screening 07/07/2012   Colon polyps    Elevated serum creatinine    with ACE inhibitor (Normal MRA of  renal arteries)   GERD (gastroesophageal reflux disease)    History of cyst of breast    Hoarseness of voice 03/13/2012   Hypertension    Lactose intolerance    Medicare annual wellness visit, subsequent 09/02/2014   Muscle spasm of left lower extremity 03/21/2007   Qualifier: Diagnosis of  By: Wynona Luna    Osteoarthritis    Osteopenia 08/17/2014   Preventative health care 04/05/2016   Shortness of breath    Skin lesion 07/10/2012   Urinary incontinence 07/10/2012   Vitamin D deficiency 04/03/2016   Past Surgical History:  Procedure Laterality Date   APPENDECTOMY     BREAST CYST EXCISION Bilateral    right breast, twice   OVARY SURGERY      right ovary & tube replacement   WRIST GANGLION EXCISION     rt   Family History  Problem Relation Age of Onset   Brain cancer Mother        deceased age 41   Alcohol abuse Mother    Obesity Mother    Other Brother        died of sepsis   Gout Brother    Alcohol abuse Father    Cancer Sister        throat   Pulmonary embolism Daughter    Mental illness Daughter        anxiety   Cancer Daughter        uterine?   Diabetes Maternal Aunt    Diabetes Maternal Aunt    Seizures Maternal Aunt    Birth defects Maternal Aunt    Lupus Grandchild    Sickle cell anemia Grandchild    Colon cancer Neg Hx    Breast cancer Neg Hx    Social History   Socioeconomic History   Marital status: Married    Spouse name: Shanon Brow   Number of children: Not on file   Years of education: Not on file   Highest education level: Not on file  Occupational History   Occupation: retired  Tobacco Use   Smoking status: Former    Types: Cigarettes    Quit date: 01/13/1991    Years since quitting: 30.0   Smokeless tobacco: Never   Tobacco comments:    quit 20 years ago 1/2 ppd x 40 years  Vaping Use   Vaping Use: Never used  Substance and Sexual Activity   Alcohol use: No   Drug use: No   Sexual activity: Never    Comment: lives with husband, no dietary restrictions,   Other Topics Concern   Not on file  Social History Narrative   Former Smoker - quit 20 years ago (1/2 ppd x 40 years)   Widowed   1 Daughter        Social Determinants of Radio broadcast assistant Strain: Low Risk    Difficulty of Paying Living Expenses: Not hard at all  Food Insecurity: No Food Insecurity   Worried About Charity fundraiser in the Last Year: Never true   Arboriculturist in the Last Year: Never true  Transportation Needs: No Transportation Needs   Lack of Transportation (Medical): No   Lack of Transportation (Non-Medical): No  Physical Activity: Inactive   Days of Exercise per Week: 0 days   Minutes  of Exercise per Session: 0 min  Stress: No Stress Concern Present   Feeling of Stress : Not at all  Social Connections: Moderately Integrated  Frequency of Communication with Friends and Family: More than three times a week   Frequency of Social Gatherings with Friends and Family: More than three times a week   Attends Religious Services: More than 4 times per year   Active Member of [employer] or Organizations: No   Attends Music therapist: Never   Marital Status: Married    Tobacco Counseling Counseling given: Not Answered Tobacco comments: quit 20 years ago 1/2 ppd x 40 years   Clinical Intake:  Pre-visit preparation completed: Yes  Pain : 0-10 Pain Score: 5  Pain Type: Chronic pain Pain Location: Leg Pain Orientation: Right, Left Pain Onset: More than a month ago Pain Frequency: Constant Pain Relieving Factors: Tylenol Arthritis  Pain Relieving Factors: Tylenol Arthritis  BMI - recorded: 34.38 Nutritional Status: BMI > 30  Obese Nutritional Risks: None Diabetes: No  How often do you need to have someone help you when you read instructions, pamphlets, or other written materials from your doctor or pharmacy?: 1 - Never  Diabetic?No  Interpreter Needed?: No  Information entered by :: Caroleen Hamman LPN   Activities of Daily Living In your present state of health, do you have any difficulty performing the following activities: 01/17/2021  Hearing? N  Vision? N  Difficulty concentrating or making decisions? N  Walking or climbing stairs? Y  Comment stairs  Dressing or bathing? N  Doing errands, shopping? N  Preparing Food and eating ? N  Using the Toilet? N  In the past six months, have you accidently leaked urine? N  Do you have problems with loss of bowel control? N  Managing your Medications? N  Managing your Finances? N  Housekeeping or managing your Housekeeping? N  Some recent data might be hidden    Patient Care Team: [name], MD  as PCP - General (Family Medicine) Berle Mull, MD as Consulting Physician (Family Medicine) Irene Shipper, MD as Consulting Physician (Gastroenterology) Bjorn Loser, MD as Consulting Physician (Urology) Druscilla Brownie, MD as Consulting Physician (Dermatology) Latanya Maudlin, MD as Consulting Physician (Orthopedic Surgery)  Indicate any recent Medical Services you may have received from other than [facility] providers in the past year (date may be approximate).     Assessment:   This is a routine wellness examination for [place].  Hearing/Vision screen Hearing Screening - Comments:: Mild hearing loss Vision Screening - Comments:: Last eye exam-2 months ago-Dr. Kathlen Mody  Dietary issues and exercise activities discussed: Current Exercise Habits: The patient does not participate in regular exercise at present, Exercise limited by: orthopedic condition(s)   Goals Addressed             This Visit's Progress    Increase physical activity   Not on track      Depression Screen PHQ 2/9 Scores 01/17/2021 12/28/2019 12/06/2018 03/15/2018 10/18/2017 04/03/2016 07/25/2015  PHQ - 2 Score 0 0 0 1 0 0 0  PHQ- 9 Score - - - 4 - - -  Exception Documentation - - - Medical reason - - -    Fall Risk Fall Risk  01/17/2021 12/28/2019 12/06/2018 10/18/2017 04/03/2016  Falls in the past year? 0 1 0 No No  Number falls in past yr: 0 0 0 - -  Injury with Fall? 0 0 0 - -  Follow up Falls prevention discussed Falls prevention discussed Education provided;Falls prevention discussed - -    FALL RISK PREVENTION PERTAINING TO THE HOME:  Any stairs in or around the home? No  Home free of loose throw rugs in walkways, pet beds, electrical cords, etc? Yes  Adequate lighting in your home to reduce risk of falls? Yes   ASSISTIVE DEVICES UTILIZED TO PREVENT FALLS:  Life alert? No  Use of a cane, walker or w/c? Yes  Grab bars in the bathroom? No  Shower chair or bench in shower? Yes  Elevated toilet seat  or a handicapped toilet? Yes   TIMED UP AND GO:  Was the test performed? Yes .  Length of time to ambulate 10 feet: 11 sec.   Gait slow and steady with assistive device  Cognitive Function:Normal cognitive status assessed by direct observation by this Nurse Health Advisor. No abnormalities found.          Immunizations Immunization History  Administered Date(s) Administered   PFIZER(Purple Top)SARS-COV-2 Vaccination 01/27/2019, 02/17/2019, 10/18/2019   Pneumococcal Conjugate-13 09/21/2013   Pneumococcal Polysaccharide-23 10/31/2010, 10/05/2016   Td 07/25/2015    TDAP status: Up to date  Flu vaccine status: Allergic  Pneumococcal vaccine status: Up to date  Covid-19 vaccine status: Information provided on how to obtain vaccines.   Qualifies for Shingles Vaccine? Yes   Zostavax completed No   Shingrix Completed?: No.    Education has been provided regarding the importance of this vaccine. Patient has been advised to call insurance company to determine out of pocket expense if they have not yet received this vaccine. Advised may also receive vaccine at local pharmacy or Health Dept. Verbalized acceptance and understanding.  Screening Tests Health Maintenance  Topic Date Due   Zoster Vaccines- Shingrix (1 of 2) Never done   COLONOSCOPY (Pts 45-11yrs Insurance coverage will need to be confirmed)  10/05/2016   COVID-19 Vaccine (4 - Booster for Pfizer series) 12/13/2019   INFLUENZA VACCINE  08/12/2020   TETANUS/TDAP  07/24/2025   Pneumonia Vaccine 85+ Years old  Completed   DEXA SCAN  Completed   HPV VACCINES  Aged Out    Health Maintenance  Health Maintenance Due  Topic Date Due   Zoster Vaccines- Shingrix (1 of 2) Never done   COLONOSCOPY (Pts 45-4yrs Insurance coverage will need to be confirmed)  10/05/2016   COVID-19 Vaccine (4 - Booster for Pfizer series) 12/13/2019   INFLUENZA VACCINE  08/12/2020    Colorectal cancer screening: No longer required.    Mammogram status: Completed bilateral 07/12/2020. Repeat every year  Bone Density status: Completed 06/18/2020. Results reflect: Bone density results: OSTEOPENIA. Repeat every 2 years.  Lung Cancer Screening: (Low Dose CT Chest recommended if Age 8-80 years, 30 pack-year currently smoking OR have quit w/in 15years.) does not qualify.    Additional Screening:  Hepatitis C Screening: does not qualify  Vision Screening: Recommended annual ophthalmology exams for early detection of glaucoma and other disorders of the eye. Is the patient up to date with their annual eye exam?  Yes  Who is the provider or what is the name of the office in which the patient attends annual eye exams? Dr. Kathlen Mody   Dental Screening: Recommended annual dental exams for proper oral hygiene  Community Resource Referral / Chronic Care Management: CRR required this visit?  No   CCM required this visit?  No      Plan:     I have personally reviewed and noted the following in the patients chart:   Medical and social history Use of alcohol, tobacco or illicit drugs  Current medications and supplements including opioid prescriptions.  Functional ability and status Nutritional status  Physical activity Advanced directives List of other physicians Hospitalizations, surgeries, and ER visits in previous 12 months Vitals Screenings to include cognitive, depression, and falls Referrals and appointments  In addition, I have reviewed and discussed with patient certain preventive protocols, quality metrics, and best practice recommendations. A written personalized care plan for preventive services as well as general preventive health recommendations were provided to patient.      Marta Antu, LPN   06/13/8636  Nurse Health Advisor  Nurse Notes: Patient is asking about previous referral to Nephrology. Message sent to PCP.

## 2021-01-17 NOTE — Patient Instructions (Signed)
Terri Mckee , Thank you for taking time to come for your Medicare Wellness Visit. I appreciate your ongoing commitment to your health goals. Please review the following plan we discussed and let me know if I can assist you in the future.   Screening recommendations/referrals: Colonoscopy: Will discuss with GI Mammogram: Completed 07/12/2020-Due 07/12/2021 Bone Density: Completed 06/18/2020-Due 06/19/2022 Recommended yearly ophthalmology/optometry visit for glaucoma screening and checkup Recommended yearly dental visit for hygiene and checkup  Vaccinations: Influenza vaccine: Allergic Pneumococcal vaccine: Up to date Tdap vaccine: Up to date Shingles vaccine: Discuss with pharmacy   Covid-19:Booster available at the pharmacy  Advanced directives: Declined information  Conditions/risks identified: See problem list  Next appointment: Follow up in one year for your annual wellness visit 01/26/2022 @ 11:40   Preventive Care 81 Years and Older, Female Preventive care refers to lifestyle choices and visits with your health care provider that can promote health and wellness. What does preventive care include? A yearly physical exam. This is also called an annual well check. Dental exams once or twice a year. Routine eye exams. Ask your health care provider how often you should have your eyes checked. Personal lifestyle choices, including: Daily care of your teeth and gums. Regular physical activity. Eating a healthy diet. Avoiding tobacco and drug use. Limiting alcohol use. Practicing safe sex. Taking low-dose aspirin every day. Taking vitamin and mineral supplements as recommended by your health care provider. What happens during an annual well check? The services and screenings done by your health care provider during your annual well check will depend on your age, overall health, lifestyle risk factors, and family history of disease. Counseling  Your health care provider may ask you  questions about your: Alcohol use. Tobacco use. Drug use. Emotional well-being. Home and relationship well-being. Sexual activity. Eating habits. History of falls. Memory and ability to understand (cognition). Work and work Statistician. Reproductive health. Screening  You may have the following tests or measurements: Height, weight, and BMI. Blood pressure. Lipid and cholesterol levels. These may be checked every 5 years, or more frequently if you are over 70 years old. Skin check. Lung cancer screening. You may have this screening every year starting at age 81 if you have a 30-pack-year history of smoking and currently smoke or have quit within the past 15 years. Fecal occult blood test (FOBT) of the stool. You may have this test every year starting at age 65. Flexible sigmoidoscopy or colonoscopy. You may have a sigmoidoscopy every 5 years or a colonoscopy every 10 years starting at age 60. Hepatitis C blood test. Hepatitis B blood test. Sexually transmitted disease (STD) testing. Diabetes screening. This is done by checking your blood sugar (glucose) after you have not eaten for a while (fasting). You may have this done every 1-3 years. Bone density scan. This is done to screen for osteoporosis. You may have this done starting at age 15. Mammogram. This may be done every 1-2 years. Talk to your health care provider about how often you should have regular mammograms. Talk with your health care provider about your test results, treatment options, and if necessary, the need for more tests. Vaccines  Your health care provider may recommend certain vaccines, such as: Influenza vaccine. This is recommended every year. Tetanus, diphtheria, and acellular pertussis (Tdap, Td) vaccine. You may need a Td booster every 10 years. Zoster vaccine. You may need this after age 44. Pneumococcal 13-valent conjugate (PCV13) vaccine. One dose is recommended after age 50. Pneumococcal  polysaccharide  (PPSV23) vaccine. One dose is recommended after age 26. Talk to your health care provider about which screenings and vaccines you need and how often you need them. This information is not intended to replace advice given to you by your health care provider. Make sure you discuss any questions you have with your health care provider. Document Released: 01/25/2015 Document Revised: 09/18/2015 Document Reviewed: 10/30/2014 Elsevier Interactive Patient Education  2017 [place] Prevention in the Home Falls can cause injuries. They can happen to people of all ages. There are many things you can do to make your home safe and to help prevent falls. What can I do on the outside of my home? Regularly fix the edges of walkways and driveways and fix any cracks. Remove anything that might make you trip as you walk through a door, such as a raised step or threshold. Trim any bushes or trees on the path to your home. Use bright outdoor lighting. Clear any walking paths of anything that might make someone trip, such as rocks or tools. Regularly check to see if handrails are loose or broken. Make sure that both sides of any steps have handrails. Any raised decks and porches should have guardrails on the edges. Have any leaves, snow, or ice cleared regularly. Use sand or salt on walking paths during winter. Clean up any spills in your garage right away. This includes oil or grease spills. What can I do in the bathroom? Use night lights. Install grab bars by the toilet and in the tub and shower. Do not use towel bars as grab bars. Use non-skid mats or decals in the tub or shower. If you need to sit down in the shower, use a plastic, non-slip stool. Keep the floor dry. Clean up any water that spills on the floor as soon as it happens. Remove soap buildup in the tub or shower regularly. Attach bath mats securely with double-sided non-slip rug tape. Do not have throw rugs and other things on the  floor that can make you trip. What can I do in the bedroom? Use night lights. Make sure that you have a light by your bed that is easy to reach. Do not use any sheets or blankets that are too big for your bed. They should not hang down onto the floor. Have a firm chair that has side arms. You can use this for support while you get dressed. Do not have throw rugs and other things on the floor that can make you trip. What can I do in the kitchen? Clean up any spills right away. Avoid walking on wet floors. Keep items that you use a lot in easy-to-reach places. If you need to reach something above you, use a strong step stool that has a grab bar. Keep electrical cords out of the way. Do not use floor polish or wax that makes floors slippery. If you must use wax, use non-skid floor wax. Do not have throw rugs and other things on the floor that can make you trip. What can I do with my stairs? Do not leave any items on the stairs. Make sure that there are handrails on both sides of the stairs and use them. Fix handrails that are broken or loose. Make sure that handrails are as long as the stairways. Check any carpeting to make sure that it is firmly attached to the stairs. Fix any carpet that is loose or worn. Avoid having throw rugs at the top  or bottom of the stairs. If you do have throw rugs, attach them to the floor with carpet tape. Make sure that you have a light switch at the top of the stairs and the bottom of the stairs. If you do not have them, ask someone to add them for you. What else can I do to help prevent falls? Wear shoes that: Do not have high heels. Have rubber bottoms. Are comfortable and fit you well. Are closed at the toe. Do not wear sandals. If you use a stepladder: Make sure that it is fully opened. Do not climb a closed stepladder. Make sure that both sides of the stepladder are locked into place. Ask someone to hold it for you, if possible. Clearly mark and make  sure that you can see: Any grab bars or handrails. First and last steps. Where the edge of each step is. Use tools that help you move around (mobility aids) if they are needed. These include: Canes. Walkers. Scooters. Crutches. Turn on the lights when you go into a dark area. Replace any light bulbs as soon as they burn out. Set up your furniture so you have a clear path. Avoid moving your furniture around. If any of your floors are uneven, fix them. If there are any pets around you, be aware of where they are. Review your medicines with your doctor. Some medicines can make you feel dizzy. This can increase your chance of falling. Ask your doctor what other things that you can do to help prevent falls. This information is not intended to replace advice given to you by your health care provider. Make sure you discuss any questions you have with your health care provider. Document Released: 10/25/2008 Document Revised: 06/06/2015 Document Reviewed: 02/02/2014 Elsevier Interactive Patient Education  2017 [employer].

## 2021-01-17 NOTE — Telephone Encounter (Signed)
Patient states she was told in November that she needed a referral to Nephrology but states she has not heard anything about an appt yet. Also, her last colonoscopy was due in 2018 but states she was unable to get it done at that time because she was taking care of her husband. She wants to know if she should have it done now at age 81. I did not place a referral for the colonoscopy at today's wellness visit because she is over age 79 & would not be covered as a screening.

## 2021-01-17 NOTE — Telephone Encounter (Signed)
Pt aware.

## 2021-03-10 LAB — BASIC METABOLIC PANEL
BUN: 26 — AB (ref 4–21)
CO2: 20 (ref 13–22)
Chloride: 105 (ref 99–108)
Creatinine: 1.2 — AB (ref 0.5–1.1)
Glucose: 87
Potassium: 4 (ref 3.4–5.3)
Sodium: 143 (ref 137–147)

## 2021-03-10 LAB — COMPREHENSIVE METABOLIC PANEL
Albumin: 4.3 (ref 3.5–5.0)
Calcium: 9.1 (ref 8.7–10.7)
eGFR: 45

## 2021-03-10 LAB — FECAL OCCULT BLOOD, GUAIAC: Fecal Occult Blood: NEGATIVE

## 2021-03-10 LAB — VITAMIN D 25 HYDROXY (VIT D DEFICIENCY, FRACTURES): Vit D, 25-Hydroxy: 50.3

## 2021-04-07 ENCOUNTER — Encounter: Payer: Self-pay | Admitting: Family Medicine

## 2021-04-07 ENCOUNTER — Ambulatory Visit (INDEPENDENT_AMBULATORY_CARE_PROVIDER_SITE_OTHER): Payer: Medicare Other | Admitting: Family Medicine

## 2021-04-07 VITALS — BP 130/70 | HR 55 | Resp 20 | Wt 237.2 lb

## 2021-04-07 DIAGNOSIS — I10 Essential (primary) hypertension: Secondary | ICD-10-CM

## 2021-04-07 DIAGNOSIS — M8580 Other specified disorders of bone density and structure, unspecified site: Secondary | ICD-10-CM

## 2021-04-07 DIAGNOSIS — Z0000 Encounter for general adult medical examination without abnormal findings: Secondary | ICD-10-CM

## 2021-04-07 DIAGNOSIS — E559 Vitamin D deficiency, unspecified: Secondary | ICD-10-CM

## 2021-04-07 DIAGNOSIS — M791 Myalgia, unspecified site: Secondary | ICD-10-CM | POA: Diagnosis not present

## 2021-04-07 DIAGNOSIS — E782 Mixed hyperlipidemia: Secondary | ICD-10-CM

## 2021-04-07 DIAGNOSIS — E8881 Metabolic syndrome: Secondary | ICD-10-CM | POA: Diagnosis not present

## 2021-04-07 DIAGNOSIS — E88819 Insulin resistance, unspecified: Secondary | ICD-10-CM

## 2021-04-07 DIAGNOSIS — R350 Frequency of micturition: Secondary | ICD-10-CM

## 2021-04-07 DIAGNOSIS — J441 Chronic obstructive pulmonary disease with (acute) exacerbation: Secondary | ICD-10-CM

## 2021-04-07 DIAGNOSIS — M25552 Pain in left hip: Secondary | ICD-10-CM | POA: Diagnosis not present

## 2021-04-07 DIAGNOSIS — M79605 Pain in left leg: Secondary | ICD-10-CM

## 2021-04-07 DIAGNOSIS — E669 Obesity, unspecified: Secondary | ICD-10-CM

## 2021-04-07 DIAGNOSIS — M79604 Pain in right leg: Secondary | ICD-10-CM

## 2021-04-07 DIAGNOSIS — M25551 Pain in right hip: Secondary | ICD-10-CM | POA: Diagnosis not present

## 2021-04-07 DIAGNOSIS — Z6831 Body mass index (BMI) 31.0-31.9, adult: Secondary | ICD-10-CM

## 2021-04-07 DIAGNOSIS — E66811 Obesity, class 1: Secondary | ICD-10-CM

## 2021-04-07 MED ORDER — FAMOTIDINE 20 MG PO TABS: ORAL_TABLET | ORAL | 1 refills | Status: DC

## 2021-04-07 MED ORDER — SYMBICORT 80-4.5 MCG/ACT IN AERO: 2.0000 | INHALATION_SPRAY | Freq: Two times a day (BID) | RESPIRATORY_TRACT | 5 refills | Status: DC

## 2021-04-07 MED ORDER — FELODIPINE ER 10 MG PO TB24: ORAL_TABLET | ORAL | 1 refills | Status: DC

## 2021-04-07 NOTE — Assessment & Plan Note (Signed)
hgba1c acceptable, minimize simple carbs. Increase exercise as tolerated.  

## 2021-04-07 NOTE — Assessment & Plan Note (Signed)
Supplement and monitor 

## 2021-04-07 NOTE — Assessment & Plan Note (Signed)
Encourage heart healthy diet such as MIND or DASH diet, increase exercise, avoid trans fats, simple carbohydrates and processed foods, consider a krill or fish or flaxseed oil cap daily.  °

## 2021-04-07 NOTE — Progress Notes (Deleted)
? ?Subjective:  ? ? Patient ID: Terri Mckee, female    DOB: [birth year], 81 y.o.   MRN: [id number] ? ?No chief complaint on file. ? ? ?HPI ?Patient is in today for a follow up. ? ?Past Medical History:  ?Diagnosis Date  ? Abnormal finding of kidney   ? 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 Acute upper respiratory infection 04/09/2014  ? Anemia   ? Arthritis   ? back  ? Back pain   ? Carpal tunnel syndrome   ? right wrist carpal tunnel syndrome  ? Cataract   ? Cataracts, bilateral 07/25/2015  ? Cervical cancer screening 07/07/2012  ? Colon polyps   ? Elevated serum creatinine   ? with ACE inhibitor (Normal MRA of renal arteries)  ? GERD (gastroesophageal reflux disease)   ? History of cyst of breast   ? Hoarseness of voice 03/13/2012  ? Hypertension   ? Lactose intolerance   ? Medicare annual wellness visit, subsequent 09/02/2014  ? Muscle spasm of left lower extremity 03/21/2007  ? Qualifier: Diagnosis of  By: Wynona Luna   ? Osteoarthritis   ? Osteopenia 08/17/2014  ? Preventative health care 04/05/2016  ? Shortness of breath   ? Skin lesion 07/10/2012  ? Urinary incontinence 07/10/2012  ? Vitamin D deficiency 04/03/2016  ? ? ?Past Surgical History:  ?Procedure Laterality Date  ? APPENDECTOMY    ? BREAST CYST EXCISION Bilateral   ? right breast, twice  ? OVARY SURGERY    ? right ovary & tube replacement  ? WRIST GANGLION EXCISION    ? rt  ? ? ?Family History  ?Problem Relation Age of Onset  ? Brain cancer Mother   ?     deceased age 38  ? Alcohol abuse Mother   ? Obesity Mother   ? Other Brother   ?     died of sepsis  ? Gout Brother   ? Alcohol abuse Father   ? Cancer Sister   ?     throat  ? Pulmonary embolism Daughter   ? Mental illness Daughter   ?     anxiety  ? Cancer Daughter   ?     uterine?  ? Diabetes Maternal Aunt   ? Diabetes Maternal Aunt   ? Seizures Maternal Aunt   ? Birth defects Maternal Aunt   ? Lupus Grandchild   ? Sickle cell anemia Grandchild   ? Colon cancer Neg Hx   ? Breast cancer  Neg Hx   ? ? ?Social History  ? ?Socioeconomic History  ? Marital status: Married  ?  Spouse name: Terri Mckee  ? Number of children: Not on file  ? Years of education: Not on file  ? Highest education level: Not on file  ?Occupational History  ? Occupation: retired  ?Tobacco Use  ? Smoking status: Former  ?  Types: Cigarettes  ?  Quit date: 01/13/1991  ?  Years since quitting: 30.2  ? Smokeless tobacco: Never  ? Tobacco comments:  ?  quit 20 years ago 1/2 ppd x 40 years  ?Vaping Use  ? Vaping Use: Never used  ?Substance and Sexual Activity  ? Alcohol use: No  ? Drug use: No  ? Sexual activity: Never  ?  Comment: lives with husband, no dietary restrictions,   ?Other Topics Concern  ? Not on file  ?Social History Narrative  ? Former Smoker - quit 20 years ago (1/2 ppd x  40 years)  ? Widowed  ? 1 Daughter       ? ?Social Determinants of Health  ? ?Financial Resource Strain: Low Risk   ? Difficulty of Paying Living Expenses: Not hard at all  ?Food Insecurity: No Food Insecurity  ? Worried About Charity fundraiser in the Last Year: Never true  ? Ran Out of Food in the Last Year: Never true  ?Transportation Needs: No Transportation Needs  ? Lack of Transportation (Medical): No  ? Lack of Transportation (Non-Medical): No  ?Physical Activity: Inactive  ? Days of Exercise per Week: 0 days  ? Minutes of Exercise per Session: 0 min  ?Stress: No Stress Concern Present  ? Feeling of Stress : Not at all  ?Social Connections: Moderately Integrated  ? Frequency of Communication with Friends and Family: More than three times a week  ? Frequency of Social Gatherings with Friends and Family: More than three times a week  ? Attends Religious Services: More than 4 times per year  ? Active Member of Clubs or Organizations: No  ? Attends Archivist Meetings: Never  ? Marital Status: Married  ?Intimate Partner Violence: Not At Risk  ? Fear of Current or Ex-Partner: No  ? Emotionally Abused: No  ? Physically Abused: No  ? Sexually  Abused: No  ? ? ?Outpatient Medications Prior to Visit  ?Medication Sig Dispense Refill  ? acetaminophen-codeine (TYLENOL #3) 300-30 MG tablet Take 1-2 tablets by mouth every 4 (four) hours as needed for moderate pain or severe pain. 40 tablet 0  ? albuterol (VENTOLIN HFA) 108 (90 Base) MCG/ACT inhaler Inhale 2 puffs into the lungs every 6 (six) hours as needed for wheezing or shortness of breath. 18 g 5  ? aspirin 81 MG tablet Take 81 mg by mouth daily.    ? Biotin w/ Vitamins C & E (HAIR/SKIN/NAILS PO) Take by mouth.    ? calcium carbonate (OS-CAL) 600 MG TABS Take 600 mg by mouth daily.    ? Cholecalciferol (VITAMIN D3 PO) Take by mouth.    ? dicyclomine (BENTYL) 20 MG tablet Take 1 tablet (20 mg total) by mouth 3 (three) times daily as needed for spasms. 20 tablet 0  ? famotidine (PEPCID) 20 MG tablet TAKE 1 TABLET(20 MG) BY MOUTH TWICE DAILY 180 tablet 1  ? felodipine (PLENDIL) 10 MG 24 hr tablet TAKE 1 TABLET(10 MG) BY MOUTH DAILY 90 tablet 1  ? Garlic 427 MG CAPS Take 1 capsule by mouth daily.    ? HYDROcodone-homatropine (HYCODAN) 5-1.5 MG/5ML syrup Take 5 mLs by mouth every 4 (four) hours as needed for cough. (Patient not taking: Reported on 12/02/2020) 240 mL 0  ? Krill Oil 1000 MG CAPS Take 1,000 capsules by mouth daily.    ? Multiple Vitamin (MULTIVITAMIN) tablet Take 1 tablet by mouth daily.    ? Omega-3 Fatty Acids (OMEGA 3 PO) Take by mouth.    ? predniSONE (DELTASONE) 10 MG tablet Take by mouth.    ? SYMBICORT 80-4.5 MCG/ACT inhaler INHALE 2 PUFFS INTO THE LUNGS IN THE MORNING AND AT BEDTIME 10.2 g 5  ? XYZAL 5 MG tablet Take 1 tablet (5 mg total) by mouth every evening. 90 tablet 3  ? ?No facility-administered medications prior to visit.  ? ? ?Allergies  ?Allergen Reactions  ? Allegra [Fexofenadine] Shortness Of Breath  ? Other   ?  Flu shot, high fever, mental status changes, vomiting x twice  ? Erythromycin Nausea Only  ?  Oxycodone-Acetaminophen Nausea And Vomiting  ? Propoxyphene N-Acetaminophen  Nausea And Vomiting  ?    severe vomiting  ? Zocor [Simvastatin] Other (See Comments)  ?    Myalgia  ? ? ?ROS ? ?   ?Objective:  ?  ?Physical Exam ? ?LMP  (LMP Unknown)  ?Wt Readings from Last 3 Encounters:  ?01/17/21 232 lb 12.8 oz (105.6 kg)  ?12/02/20 233 lb 3.2 oz (105.8 kg)  ?08/27/20 233 lb 6.4 oz (105.9 kg)  ? ? ?Diabetic Foot Exam - Simple   ?No data filed ?  ? ?Lab Results  ?Component Value Date  ? WBC 6.4 12/02/2020  ? HGB 13.1 12/02/2020  ? HCT 39.7 12/02/2020  ? PLT 200.0 12/02/2020  ? GLUCOSE 86 12/02/2020  ? CHOL 214 (H) 12/02/2020  ? TRIG 113.0 12/02/2020  ? HDL 43.50 12/02/2020  ? LDLCALC 148 (H) 12/02/2020  ? ALT 13 12/02/2020  ? AST 14 12/02/2020  ? NA 143 03/10/2021  ? K 4.0 03/10/2021  ? CL 105 03/10/2021  ? CREATININE 1.2 (A) 03/10/2021  ? BUN 26 (A) 03/10/2021  ? CO2 20 03/10/2021  ? TSH 2.20 12/02/2020  ? HGBA1C 5.4 12/02/2020  ? ? ?Lab Results  ?Component Value Date  ? TSH 2.20 12/02/2020  ? ?Lab Results  ?Component Value Date  ? WBC 6.4 12/02/2020  ? HGB 13.1 12/02/2020  ? HCT 39.7 12/02/2020  ? MCV 88.7 12/02/2020  ? PLT 200.0 12/02/2020  ? ?Lab Results  ?Component Value Date  ? NA 143 03/10/2021  ? K 4.0 03/10/2021  ? CO2 20 03/10/2021  ? GLUCOSE 86 12/02/2020  ? BUN 26 (A) 03/10/2021  ? CREATININE 1.2 (A) 03/10/2021  ? BILITOT 0.3 12/02/2020  ? ALKPHOS 53 12/02/2020  ? AST 14 12/02/2020  ? ALT 13 12/02/2020  ? PROT 6.9 12/02/2020  ? ALBUMIN 4.3 03/10/2021  ? CALCIUM 9.1 03/10/2021  ? ANIONGAP 7 02/26/2018  ? EGFR 45 03/10/2021  ? GFR 34.86 (L) 12/02/2020  ? ?Lab Results  ?Component Value Date  ? CHOL 214 (H) 12/02/2020  ? ?Lab Results  ?Component Value Date  ? HDL 43.50 12/02/2020  ? ?Lab Results  ?Component Value Date  ? LDLCALC 148 (H) 12/02/2020  ? ?Lab Results  ?Component Value Date  ? TRIG 113.0 12/02/2020  ? ?Lab Results  ?Component Value Date  ? CHOLHDL 5 12/02/2020  ? ?Lab Results  ?Component Value Date  ? HGBA1C 5.4 12/02/2020  ? ? ?   ?Assessment & Plan:  ? ?Problem List Items  Addressed This Visit   ?None ? ? ?I am having Terri Danker. Speegle "Dot" maintain her aspirin, calcium carbonate, Garlic, multivitamin, Krill Oil, dicyclomine, Xyzal, albuterol, acetaminophen-codeine, HYDROc

## 2021-04-07 NOTE — Patient Instructions (Signed)
Hip Pain The hip is the joint between the upper legs and the lower pelvis. The bones, cartilage, tendons, and muscles of your hip joint support your body and allow you to move around. Hip pain can range from a minor ache to severe pain in one or both of your hips. The pain may be felt on the inside of the hip joint near the groin, or on the outside near the buttocks and upper thigh. You may also have swelling or stiffness in your hip area. Follow these instructions at home: Managing pain, stiffness, and swelling   If directed, put ice on the painful area. To do this: Put ice in a plastic bag. Place a towel between your skin and the bag. Leave the ice on for 20 minutes, 2-3 times a day. If directed, apply heat to the affected area as often as told by your health care provider. Use the heat source that your health care provider recommends, such as a moist heat pack or a heating pad. Place a towel between your skin and the heat source. Leave the heat on for 20-30 minutes. Remove the heat if your skin turns bright red. This is especially important if you are unable to feel pain, heat, or cold. You may have a greater risk of getting burned. Activity Do exercises as told by your health care provider. Avoid activities that cause pain. General instructions  Take over-the-counter and prescription medicines only as told by your health care provider. Keep a journal of your symptoms. Write down: How often you have hip pain. The location of your pain. What the pain feels like. What makes the pain worse. Sleep with a pillow between your legs on your most comfortable side. Keep all follow-up visits as told by your health care provider. This is important. Contact a health care provider if: You cannot put weight on your leg. Your pain or swelling continues or gets worse after one week. It gets harder to walk. You have a fever. Get help right away if: You fall. You have a sudden increase in pain and  swelling in your hip. Your hip is red or swollen or very tender to touch. Summary Hip pain can range from a minor ache to severe pain in one or both of your hips. The pain may be felt on the inside of the hip joint near the groin, or on the outside near the buttocks and upper thigh. Avoid activities that cause pain. Write down how often you have hip pain, the location of the pain, what makes it worse, and what it feels like. This information is not intended to replace advice given to you by your health care provider. Make sure you discuss any questions you have with your health care provider. Document Revised: 05/16/2018 Document Reviewed: 05/16/2018 Elsevier Patient Education  2022 [employer]  

## 2021-04-07 NOTE — Assessment & Plan Note (Signed)
Encouraged to get adequate exercise, calcium and vitamin d intake 

## 2021-04-07 NOTE — Progress Notes (Signed)
? ?Subjective:  ? ?By signing my name below, I, [name], attest that this documentation has been prepared under the direction and in the presence of [name], MD. 04/07/2021  ?   ? ? Patient ID: Terri Mckee, female    DOB: May 17, 1940, 81 y.o.   MRN: [id number] ? ?Chief Complaint  ?Patient presents with  ? Follow-up  ?  Bilateral leg pain  ? ? ?HPI ?Patient is in today for an office visit and 4 months f/u. ? ?She is complaining of bilateral leg pain that has been present for about a year. Walking makes it worse but when she is laying down or sitting, she is relieved. Adds that it feels like sore muscles and it feels like her legs are about to give out. The pain radiates from her hip to her knees. She reports that one day, it felt like the pain was deep in her bone. She had done nothing prior to this. The pain does not disturb her sleep. She reports the pain is also in her back when she walks for a while. She denies hip pain. She tried physical therapy but cannot remember if it helped.  ? ?Past Medical History:  ?Diagnosis Date  ? Abnormal finding of kidney   ? 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 Acute upper respiratory infection 04/09/2014  ? Anemia   ? Arthritis   ? back  ? Back pain   ? Carpal tunnel syndrome   ? right wrist carpal tunnel syndrome  ? Cataract   ? Cataracts, bilateral 07/25/2015  ? Cervical cancer screening 07/07/2012  ? Colon polyps   ? Elevated serum creatinine   ? with ACE inhibitor (Normal MRA of renal arteries)  ? GERD (gastroesophageal reflux disease)   ? History of cyst of breast   ? Hoarseness of voice 03/13/2012  ? Hypertension   ? Lactose intolerance   ? Medicare annual wellness visit, subsequent 09/02/2014  ? Muscle spasm of left lower extremity 03/21/2007  ? Qualifier: Diagnosis of  By: Wynona Luna   ? Osteoarthritis   ? Osteopenia 08/17/2014  ? Preventative health care 04/05/2016  ? Shortness of breath   ? Skin lesion 07/10/2012  ? Urinary incontinence 07/10/2012   ? Vitamin D deficiency 04/03/2016  ? ? ?Past Surgical History:  ?Procedure Laterality Date  ? APPENDECTOMY    ? BREAST CYST EXCISION Bilateral   ? right breast, twice  ? OVARY SURGERY    ? right ovary & tube replacement  ? WRIST GANGLION EXCISION    ? rt  ? ? ?Family History  ?Problem Relation Age of Onset  ? Brain cancer Mother   ?     deceased age 47  ? Alcohol abuse Mother   ? Obesity Mother   ? Other Brother   ?     died of sepsis  ? Gout Brother   ? Alcohol abuse Father   ? Cancer Sister   ?     throat  ? Pulmonary embolism Daughter   ? Mental illness Daughter   ?     anxiety  ? Cancer Daughter   ?     uterine?  ? Diabetes Maternal Aunt   ? Diabetes Maternal Aunt   ? Seizures Maternal Aunt   ? Birth defects Maternal Aunt   ? Lupus Grandchild   ? Sickle cell anemia Grandchild   ? Colon cancer Neg Hx   ? Breast cancer Neg  Hx   ? ? ?Social History  ? ?Socioeconomic History  ? Marital status: Married  ?  Spouse name: Shanon Brow  ? Number of children: Not on file  ? Years of education: Not on file  ? Highest education level: Not on file  ?Occupational History  ? Occupation: retired  ?Tobacco Use  ? Smoking status: Former  ?  Types: Cigarettes  ?  Quit date: 01/13/1991  ?  Years since quitting: 30.2  ? Smokeless tobacco: Never  ? Tobacco comments:  ?  quit 20 years ago 1/2 ppd x 40 years  ?Vaping Use  ? Vaping Use: Never used  ?Substance and Sexual Activity  ? Alcohol use: No  ? Drug use: No  ? Sexual activity: Never  ?  Comment: lives with husband, no dietary restrictions,   ?Other Topics Concern  ? Not on file  ?Social History Narrative  ? Former Smoker - quit 20 years ago (1/2 ppd x 40 years)  ? Widowed  ? 1 Daughter       ? ?Social Determinants of Health  ? ?Financial Resource Strain: Low Risk   ? Difficulty of Paying Living Expenses: Not hard at all  ?Food Insecurity: No Food Insecurity  ? Worried About Charity fundraiser in the Last Year: Never true  ? Ran Out of Food in the Last Year: Never true  ?Transportation  Needs: No Transportation Needs  ? Lack of Transportation (Medical): No  ? Lack of Transportation (Non-Medical): No  ?Physical Activity: Inactive  ? Days of Exercise per Week: 0 days  ? Minutes of Exercise per Session: 0 min  ?Stress: No Stress Concern Present  ? Feeling of Stress : Not at all  ?Social Connections: Moderately Integrated  ? Frequency of Communication with Friends and Family: More than three times a week  ? Frequency of Social Gatherings with Friends and Family: More than three times a week  ? Attends Religious Services: More than 4 times per year  ? Active Member of Clubs or Organizations: No  ? Attends Archivist Meetings: Never  ? Marital Status: Married  ?Intimate Partner Violence: Not At Risk  ? Fear of Current or Ex-Partner: No  ? Emotionally Abused: No  ? Physically Abused: No  ? Sexually Abused: No  ? ? ?Outpatient Medications Prior to Visit  ?Medication Sig Dispense Refill  ? acetaminophen-codeine (TYLENOL #3) 300-30 MG tablet Take 1-2 tablets by mouth every 4 (four) hours as needed for moderate pain or severe pain. 40 tablet 0  ? albuterol (VENTOLIN HFA) 108 (90 Base) MCG/ACT inhaler Inhale 2 puffs into the lungs every 6 (six) hours as needed for wheezing or shortness of breath. 18 g 5  ? aspirin 81 MG tablet Take 81 mg by mouth daily.    ? Biotin w/ Vitamins C & E (HAIR/SKIN/NAILS PO) Take by mouth.    ? calcium carbonate (OS-CAL) 600 MG TABS Take 600 mg by mouth daily.    ? Cholecalciferol (VITAMIN D3 PO) Take by mouth.    ? dicyclomine (BENTYL) 20 MG tablet Take 1 tablet (20 mg total) by mouth 3 (three) times daily as needed for spasms. 20 tablet 0  ? Garlic 381 MG CAPS Take 1 capsule by mouth daily.    ? HYDROcodone-homatropine (HYCODAN) 5-1.5 MG/5ML syrup Take 5 mLs by mouth every 4 (four) hours as needed for cough. 240 mL 0  ? Krill Oil 1000 MG CAPS Take 1,000 capsules by mouth daily.    ?  Multiple Vitamin (MULTIVITAMIN) tablet Take 1 tablet by mouth daily.    ? Omega-3 Fatty  Acids (OMEGA 3 PO) Take by mouth.    ? predniSONE (DELTASONE) 10 MG tablet Take by mouth.    ? XYZAL 5 MG tablet Take 1 tablet (5 mg total) by mouth every evening. 90 tablet 3  ? famotidine (PEPCID) 20 MG tablet TAKE 1 TABLET(20 MG) BY MOUTH TWICE DAILY 180 tablet 1  ? felodipine (PLENDIL) 10 MG 24 hr tablet TAKE 1 TABLET(10 MG) BY MOUTH DAILY 90 tablet 1  ? SYMBICORT 80-4.5 MCG/ACT inhaler INHALE 2 PUFFS INTO THE LUNGS IN THE MORNING AND AT BEDTIME 10.2 g 5  ? ?No facility-administered medications prior to visit.  ? ? ?Allergies  ?Allergen Reactions  ? Allegra [Fexofenadine] Shortness Of Breath  ? Haemophilus Influenzae Vaccines Nausea And Vomiting  ? Erythromycin Nausea Only  ? Oxycodone-Acetaminophen Nausea And Vomiting  ? Propoxyphene N-Acetaminophen Nausea And Vomiting  ?    severe vomiting  ? Zocor [Simvastatin] Other (See Comments)  ?    Myalgia  ? ? ?Review of Systems  ?Constitutional:  Negative for fever and malaise/fatigue.  ?HENT:  Negative for congestion.   ?Eyes:  Negative for redness.  ?Respiratory:  Negative for shortness of breath.   ?Cardiovascular:  Negative for chest pain, palpitations and leg swelling.  ?Gastrointestinal:  Negative for abdominal pain, blood in stool and nausea.  ?Genitourinary:  Negative for dysuria and frequency.  ?Musculoskeletal:  Positive for back pain. Negative for falls.  ?     (+) bilateral leg pain  ?Skin:  Negative for rash.  ?Neurological:  Negative for dizziness, loss of consciousness and headaches.  ?Endo/Heme/Allergies:  Negative for polydipsia.  ?Psychiatric/Behavioral:  Negative for depression. The patient is not nervous/anxious.   ? ?   ?Objective:  ?  ?Physical Exam ?Constitutional:   ?   General: She is not in acute distress. ?   Appearance: She is well-developed.  ?HENT:  ?   Head: Normocephalic and atraumatic.  ?Eyes:  ?   Conjunctiva/sclera: Conjunctivae normal.  ?Neck:  ?   Thyroid: No thyromegaly.  ?Cardiovascular:  ?   Rate and Rhythm: Normal rate and  regular rhythm.  ?   Heart sounds: Normal heart sounds. No murmur heard. ?Pulmonary:  ?   Effort: Pulmonary effort is normal. No respiratory distress.  ?   Breath sounds: Normal breath sounds.  ?Abdominal:

## 2021-04-07 NOTE — Assessment & Plan Note (Signed)
Encouraged DASH or MIND diet, decrease po intake and increase exercise as tolerated. Needs 7-8 hours of sleep nightly. Avoid trans fats, eat small, frequent meals every 4-5 hours with lean proteins, complex carbs and healthy fats. Minimize simple carbs, high fat foods and processed foods 

## 2021-04-07 NOTE — Assessment & Plan Note (Signed)
Well controlled, no changes to meds. Encouraged heart healthy diet such as the DASH diet and exercise as tolerated.  °

## 2021-04-07 NOTE — Assessment & Plan Note (Signed)
With pain traveling down both thighs to knees, worse with ambulation and she is to the point where when she is out with family she has to get a scooter to be able to get around. 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 Will check labs today and is referred to Sports Medicine for further work up and consideration.  ?

## 2021-04-08 LAB — CBC
HCT: 38.9 % (ref 36.0–46.0)
Hemoglobin: 12.8 g/dL (ref 12.0–15.0)
MCHC: 33 g/dL (ref 30.0–36.0)
MCV: 89 fl (ref 78.0–100.0)
Platelets: 191 10*3/uL (ref 150.0–400.0)
RBC: 4.37 Mil/uL (ref 3.87–5.11)
RDW: 14.3 % (ref 11.5–15.5)
WBC: 7.3 10*3/uL (ref 4.0–10.5)

## 2021-04-08 LAB — COMPREHENSIVE METABOLIC PANEL
ALT: 13 U/L (ref 0–35)
AST: 13 U/L (ref 0–37)
Albumin: 4.2 g/dL (ref 3.5–5.2)
Alkaline Phosphatase: 54 U/L (ref 39–117)
BUN: 17 mg/dL (ref 6–23)
CO2: 27 mEq/L (ref 19–32)
Calcium: 9.4 mg/dL (ref 8.4–10.5)
Chloride: 105 mEq/L (ref 96–112)
Creatinine, Ser: 1.1 mg/dL (ref 0.40–1.20)
GFR: 47.24 mL/min — ABNORMAL LOW (ref >60.00)
Glucose, Bld: 70 mg/dL (ref 70–99)
Potassium: 4 mEq/L (ref 3.5–5.1)
Sodium: 141 mEq/L (ref 135–145)
Total Bilirubin: 0.5 mg/dL (ref 0.2–1.2)
Total Protein: 7.1 g/dL (ref 6.0–8.3)

## 2021-04-08 LAB — LIPID PANEL
Cholesterol: 220 mg/dL — ABNORMAL HIGH (ref 0–200)
HDL: 50 mg/dL (ref >39.00)
LDL Cholesterol: 152 mg/dL — ABNORMAL HIGH (ref 0–99)
NonHDL: 169.59
Total CHOL/HDL Ratio: 4
Triglycerides: 89 mg/dL (ref 0.0–149.0)
VLDL: 17.8 mg/dL (ref 0.0–40.0)

## 2021-04-08 LAB — MAGNESIUM: Magnesium: 2.1 mg/dL (ref 1.5–2.5)

## 2021-04-08 LAB — HIGH SENSITIVITY CRP: CRP, High Sensitivity: 28.84 mg/L — ABNORMAL HIGH (ref 0.000–5.000)

## 2021-04-08 LAB — HEMOGLOBIN A1C: Hgb A1c MFr Bld: 5.5 % (ref 4.6–6.5)

## 2021-04-08 LAB — TSH: TSH: 3.02 u[IU]/mL (ref 0.35–5.50)

## 2021-04-08 LAB — SEDIMENTATION RATE: Sed Rate: 24 mm/hr (ref 0–30)

## 2021-04-08 LAB — CK: Total CK: 131 U/L (ref 7–177)

## 2021-04-09 NOTE — Addendum Note (Signed)
Addended by: Lynnea Ferrier R on: 04/09/2021 09:51 AM ? ? Modules accepted: Orders ? ?

## 2021-04-10 ENCOUNTER — Other Ambulatory Visit (INDEPENDENT_AMBULATORY_CARE_PROVIDER_SITE_OTHER): Payer: Medicare Other

## 2021-04-10 DIAGNOSIS — R350 Frequency of micturition: Secondary | ICD-10-CM

## 2021-04-10 LAB — URINALYSIS, ROUTINE W REFLEX MICROSCOPIC
Bilirubin Urine: NEGATIVE
Ketones, ur: NEGATIVE
Nitrite: NEGATIVE
Specific Gravity, Urine: 1.02 (ref 1.000–1.030)
Total Protein, Urine: NEGATIVE
Urine Glucose: NEGATIVE
Urobilinogen, UA: 0.2 (ref 0.0–1.0)
pH: 6 (ref 5.0–8.0)

## 2021-04-11 LAB — URINE CULTURE
MICRO NUMBER:: 13201648
SPECIMEN QUALITY:: ADEQUATE

## 2021-04-11 NOTE — Progress Notes (Signed)
? ? ?Subjective:   ? ?CC: B hip and leg pain ? ?I, Judy Pimple, am serving as a scribe for Dr. Lynne Leader. ? ?HPI: Pt is an 81 y/o female presenting w/ c/o B leg pain/soreness x1 year, but the pain radiating own her lateral right leg has been a couple weeks. approximately one year w/ no known MOI.  She locates her pain to The front of both legs that radiate down to the knees, states her muscles are very sore feeling. The pain is only there when walking. When sitting and laying there is no pain.  ? ?Radiating pain: yes  ?Low back pain: yes  ?LE paresthesias: no ?Aggravating factors: walking; ?Treatments tried: PT in the past; tylenol '650mg'$  arthritis 1 3 times a day.  ? ?Diagnostic testing: L-spine MRI- 06/04/20; B hip XR- 04/17/20 ? ?Pertinent review of Systems: No fevers or chills ? ?Relevant historical information: Hypertension ? ? ?Objective:   ? ?Vitals:  ? 04/14/21 1045  ?BP: 122/82  ?Pulse: 68  ?SpO2: 98%  ? ?General: Well Developed, well nourished, and in no acute distress.  ? ?MSK: Right hip normal-appearing ?Decreased range of motion with pain felt with flexion and internal rotation. ? ? ?Left hip normal-appearing ?Decreased range of motion painful with flexion and internal rotation. ? ?Lab and Radiology Results ? ?Procedure: Real-time Ultrasound Guided Injection of right hip femoral acetabular joint anterior approach ?Device: Philips Affiniti 50G ?Unfortunately due to technical error images were not able to be stored.  No charge for the ultrasound component of today's ultrasound guided injection.  However I did use ultrasound for this injection. ?Verbal informed consent obtained.  Discussed risks and benefits of procedure. Warned about infection bleeding damage to structures skin hypopigmentation and fat atrophy among others. ?Patient expresses understanding and agreement ?Time-out conducted.   ?Noted no overlying erythema, induration, or other signs of local infection.   ?Skin prepped in a sterile  fashion.   ?Local anesthesia: Topical Ethyl chloride.   ?With sterile technique and under real time ultrasound guidance: 40 mg of Kenalog and 2 mL of Marcaine injected into hip joint. Fluid seen entering the joint capsule.   ?Completed without difficulty   ?Pain immediately resolved suggesting accurate placement of the medication.   ?Advised to call if fevers/chills, erythema, induration, drainage, or persistent bleeding.   ?Images permanently stored and available for review in the ultrasound unit.  ?Impression: Technically successful ultrasound guided injection. ? ? ?X-ray images hips bilaterally obtained today personally and independently interpreted. ?Severe hip DJD present bilaterally.  No acute fractures are present. ?Await formal radiology review ? ?L-spine MRI report from [facility] visible in the imaging tab was reviewed. ? ? ? ? ?Impression and Recommendations:   ? ?Assessment and Plan: ?81 y.o. female with bilateral leg pain multifactorial.  The majority of her thigh pain bilaterally I believe is due to hip DJD.  She does have significant hip arthritis seen on x-ray and a diagnostic and hopefully therapeutic intra-articular right hip injection provided immediate and good pain relief.  Hopefully she will have some benefit with the steroid component that provides a little long-lasting relief. ? ?She can return as soon as 1 week from today for a left hip injection. ? ?She does have some pain in her lower leg that I do think is L5 lumbar radiculopathy which does fit with the report from a lumbar MRI from [facility] that scanned into her chart.  This seems to be a lesser issue. ? ?Based  on how poorly she is moving and the severity of DJD I think is likely that she is going to require hip replacement in the near future.  We spent time talking about that as well.. ? ?PDMP not reviewed this encounter. ?Orders Placed This Encounter  ?Procedures  ? DG HIPS BILAT W OR W/O PELVIS MIN 5 VIEWS  ?   Standing Status:   Future  ?  Number of Occurrences:   1  ?  Standing Expiration Date:   04/15/2022  ?  Order Specific Question:   Reason for Exam (SYMPTOM  OR DIAGNOSIS REQUIRED)  ?  Answer:   Hip Pain  ?  Order Specific Question:   Preferred imaging location?  ?  Answer:   [facility]  ? [place] LIMITED JOINT SPACE STRUCTURES LOW RIGHT(NO LINKED CHARGES)  ?  Order Specific Question:   Reason for Exam (SYMPTOM  OR DIAGNOSIS REQUIRED)  ?  Answer:   rt hip  ?  Order Specific Question:   Preferred imaging location?  ?  Answer:   [place]  ? ?No orders of the defined types were placed in this encounter. ? ? ?Discussed warning signs or symptoms. Please see discharge instructions. Patient expresses understanding. ? ? ?The above documentation has been reviewed and is accurate and complete Lynne Leader, M.D. ? ?

## 2021-04-14 ENCOUNTER — Ambulatory Visit (INDEPENDENT_AMBULATORY_CARE_PROVIDER_SITE_OTHER): Payer: Medicare Other

## 2021-04-14 ENCOUNTER — Ambulatory Visit: Payer: Medicare Other | Admitting: Family Medicine

## 2021-04-14 ENCOUNTER — Ambulatory Visit: Payer: Self-pay

## 2021-04-14 VITALS — BP 122/82 | HR 68 | Ht 69.0 in | Wt 238.0 lb

## 2021-04-14 DIAGNOSIS — M25551 Pain in right hip: Secondary | ICD-10-CM | POA: Diagnosis not present

## 2021-04-14 DIAGNOSIS — M25552 Pain in left hip: Secondary | ICD-10-CM

## 2021-04-14 NOTE — Patient Instructions (Addendum)
Good to see you  ?Xrays done today  ?Injection given today  ?Call or go to the ER if you develop a large red swollen joint with extreme pain or oozing puss.  ? ?Return as soon as 1 week to inject the left hip ?

## 2021-04-15 NOTE — Progress Notes (Signed)
Bilateral hip x-ray shows severe arthritis of both hips.  As we discussed in clinic yesterday I think hip arthritis is the main reason that you have pain.

## 2021-04-25 ENCOUNTER — Ambulatory Visit (INDEPENDENT_AMBULATORY_CARE_PROVIDER_SITE_OTHER): Payer: Medicare Other | Admitting: Family Medicine

## 2021-04-25 ENCOUNTER — Ambulatory Visit: Payer: Self-pay

## 2021-04-25 DIAGNOSIS — M160 Bilateral primary osteoarthritis of hip: Secondary | ICD-10-CM | POA: Diagnosis not present

## 2021-04-25 DIAGNOSIS — M25551 Pain in right hip: Secondary | ICD-10-CM | POA: Diagnosis not present

## 2021-04-25 DIAGNOSIS — M25552 Pain in left hip: Secondary | ICD-10-CM | POA: Diagnosis not present

## 2021-04-25 DIAGNOSIS — G8929 Other chronic pain: Secondary | ICD-10-CM | POA: Diagnosis not present

## 2021-04-25 NOTE — Patient Instructions (Addendum)
Thank you for coming in today.  ? ?You received an injection today. Seek immediate medical attention if the joint becomes red, extremely painful, or is oozing fluid.  ? ?Recheck back as needed ? ?We can repeat the injection every 3 months if needed ?

## 2021-04-25 NOTE — Progress Notes (Signed)
?  Dot presents to clinic today for left hip femoral acetabular joint injection.  She had contralateral right hip injection on April 3 ? ?Procedure: Real-time Ultrasound Guided Injection of left hip femoral acetabular joint ?Device: Philips Affiniti 50G ?Images permanently stored and available for review in PACS ?Verbal informed consent obtained.  Discussed risks and benefits of procedure. Warned about infection, bleeding, hyperglycemia damage to structures among others. ?Patient expresses understanding and agreement ?Time-out conducted.   ?Noted no overlying erythema, induration, or other signs of local infection.   ?Skin prepped in a sterile fashion.   ?Local anesthesia: Topical Ethyl chloride.   ?With sterile technique and under real time ultrasound guidance: 40 mg of Kenalog and 2 mL of Marcaine injected into the hip joint. Fluid seen entering the joint capsule.   ?Completed without difficulty   ?Pain immediately resolved suggesting accurate placement of the medication.   ?Advised to call if fevers/chills, erythema, induration, drainage, or persistent bleeding.   ?Images permanently stored and available for review in the ultrasound unit.  ?Impression: Technically successful ultrasound guided injection. ? ?Return as needed.  I can repeat these injections every 3 months.  If the injection does not last as long as we would like would recommend hip replacement. ? ? ?

## 2021-07-07 ENCOUNTER — Other Ambulatory Visit: Payer: Self-pay

## 2021-07-07 DIAGNOSIS — I10 Essential (primary) hypertension: Secondary | ICD-10-CM

## 2021-07-07 MED ORDER — FELODIPINE ER 10 MG PO TB24: ORAL_TABLET | ORAL | 1 refills | Status: DC

## 2021-07-07 MED ORDER — FAMOTIDINE 20 MG PO TABS
ORAL_TABLET | ORAL | 1 refills | Status: DC
Start: 2021-07-07 — End: 2022-08-03

## 2021-07-24 ENCOUNTER — Other Ambulatory Visit: Payer: Self-pay | Admitting: Family Medicine

## 2021-07-24 DIAGNOSIS — Z1231 Encounter for screening mammogram for malignant neoplasm of breast: Secondary | ICD-10-CM

## 2021-07-25 ENCOUNTER — Encounter: Payer: Self-pay | Admitting: Family Medicine

## 2021-07-25 ENCOUNTER — Ambulatory Visit: Payer: Self-pay

## 2021-07-25 ENCOUNTER — Ambulatory Visit: Payer: Medicare Other | Admitting: Family Medicine

## 2021-07-25 VITALS — BP 130/80 | HR 66 | Ht 69.0 in | Wt 240.4 lb

## 2021-07-25 DIAGNOSIS — M25552 Pain in left hip: Secondary | ICD-10-CM

## 2021-07-25 DIAGNOSIS — M25551 Pain in right hip: Secondary | ICD-10-CM

## 2021-07-25 DIAGNOSIS — M160 Bilateral primary osteoarthritis of hip: Secondary | ICD-10-CM | POA: Diagnosis not present

## 2021-07-25 NOTE — Progress Notes (Signed)
I, Wendy Poet, LAT, ATC, am serving as scribe for Dr. Lynne Leader.  Terri Mckee is a 81 y.o. female who presents to [place] at [facility] today for continued bilat hip pain. Pt was last seen by Dr. Georgina Snell on 04/25/21 and was given a L femoral acetabular joint steroid injection. Pt's last R femoral acetabular joint steroid injection was on 04/14/21. Today, pt reports that her hips are feeling much better and is still getting relief from her prior injections.  Both hips are feeling about the same.  Dx imaging: 04/14/21 Bilat hips XR  06/04/20 L-spine MRI  04/17/20 Bilat hips XR  Pertinent review of systems: No fevers or chills  Relevant historical information: Hypertension   Exam:  BP 130/80 (BP Location: Left Arm, Patient Position: Sitting, Cuff Size: Large)   Pulse 66   Ht '5\' 9"'$  (1.753 m)   Wt 240 lb 6.4 oz (109 kg)   LMP  (LMP Unknown)   SpO2 97%   BMI 35.50 kg/m  General: Well Developed, well nourished, and in no acute distress.   MSK: Hips bilaterally normal-appearing decreased range of motion.    Lab and Radiology Results  Procedure: Real-time Ultrasound Guided Injection of right hip femoral acetabular joint anterior approach Device: Philips Affiniti 50G Images permanently stored and available for review in PACS Verbal informed consent obtained.  Discussed risks and benefits of procedure. Warned about infection, bleeding, hyperglycemia damage to structures among others. Patient expresses understanding and agreement Time-out conducted.   Noted no overlying erythema, induration, or other signs of local infection.   Skin prepped in a sterile fashion.   Local anesthesia: Topical Ethyl chloride.   With sterile technique and under real time ultrasound guidance: 40 mg of Kenalog and 2 mL of Marcaine injected into hip joint. Fluid seen entering the joint capsule.   Completed without difficulty   Pain immediately resolved suggesting accurate  placement of the medication.   Advised to call if fevers/chills, erythema, induration, drainage, or persistent bleeding.   Images permanently stored and available for review in the ultrasound unit.  Impression: Technically successful ultrasound guided injection.    Procedure: Real-time Ultrasound Guided Injection of left hip femoral acetabular joint anterior approach Device: Philips Affiniti 50G Images permanently stored and available for review in PACS Verbal informed consent obtained.  Discussed risks and benefits of procedure. Warned about infection, bleeding, hyperglycemia damage to structures among others. Patient expresses understanding and agreement Time-out conducted.   Noted no overlying erythema, induration, or other signs of local infection.   Skin prepped in a sterile fashion.   Local anesthesia: Topical Ethyl chloride.   With sterile technique and under real time ultrasound guidance: 40 mg of Kenalog and 2 mL of Marcaine injected into hip joint. Fluid seen entering the joint capsule.   Completed without difficulty   Pain immediately resolved suggesting accurate placement of the medication.   Advised to call if fevers/chills, erythema, induration, drainage, or persistent bleeding.   Images permanently stored and available for review in the ultrasound unit.  Impression: Technically successful ultrasound guided injection.         Assessment and Plan: 81 y.o. female with bilateral hip pain due to exacerbation of DJD.  Plan for bilateral anterior hip injection.  Recheck back in 3 months.  Can repeat injection at that time if needed.   PDMP not reviewed this encounter. Orders Placed This Encounter  Procedures   [place] LIMITED JOINT SPACE STRUCTURES LOW BILAT(NO LINKED  CHARGES)    Order Specific Question:   Reason for Exam (SYMPTOM  OR DIAGNOSIS REQUIRED)    Answer:   B hip pain    Order Specific Question:   Preferred imaging location?    Answer:   [place]   No orders of the defined types were placed in this encounter.    Discussed warning signs or symptoms. Please see discharge instructions. Patient expresses understanding.   The above documentation has been reviewed and is accurate and complete Lynne Leader, M.D.

## 2021-07-25 NOTE — Patient Instructions (Addendum)
Good to see you today.  You had B hip injections.  Call or go to the ER if you develop a large red swollen joint with extreme pain or oozing puss.    Follow-up: 3 months or as needed

## 2021-07-31 ENCOUNTER — Ambulatory Visit
Admission: RE | Admit: 2021-07-31 | Discharge: 2021-07-31 | Disposition: A | Discharge status: Home / Self Care | Payer: Medicare Other | Source: Ambulatory Visit | Attending: Family Medicine | Admitting: Family Medicine

## 2021-07-31 DIAGNOSIS — Z1231 Encounter for screening mammogram for malignant neoplasm of breast: Secondary | ICD-10-CM

## 2021-09-02 ENCOUNTER — Ambulatory Visit (INDEPENDENT_AMBULATORY_CARE_PROVIDER_SITE_OTHER): Payer: Medicare Other | Admitting: Family Medicine

## 2021-09-02 VITALS — BP 120/82 | HR 72 | Temp 98.0°F | Resp 16 | Ht 69.0 in | Wt 241.2 lb

## 2021-09-02 DIAGNOSIS — E559 Vitamin D deficiency, unspecified: Secondary | ICD-10-CM

## 2021-09-02 DIAGNOSIS — I10 Essential (primary) hypertension: Secondary | ICD-10-CM | POA: Diagnosis not present

## 2021-09-02 DIAGNOSIS — R252 Cramp and spasm: Secondary | ICD-10-CM

## 2021-09-02 DIAGNOSIS — E782 Mixed hyperlipidemia: Secondary | ICD-10-CM

## 2021-09-02 DIAGNOSIS — E8881 Metabolic syndrome: Secondary | ICD-10-CM | POA: Diagnosis not present

## 2021-09-02 NOTE — Patient Instructions (Addendum)
[place]'s leg cramp medicine or tonic water Muscle Cramps and Spasms Muscle cramps and spasms are when muscles tighten by themselves. They usually get better within minutes. Muscle cramps are painful. They are usually stronger and last longer than muscle spasms. Muscle spasms may or may not be painful. They can last a few seconds or much longer. Cramps and spasms can affect any muscle, but they occur most often in the calf muscles of the leg. They are usually not caused by a serious problem. In many cases, the cause is not known. Some common causes include: Doing more physical work or exercise than your body is ready for. Using the muscles too much (overuse) by repeating certain movements too many times. Staying in a certain position for a long time. Playing a sport or doing an activity without preparing properly. Using bad form or technique while playing a sport or doing an activity. Not having enough water in your body (dehydration). Injury. Side effects of some medicines. Low levels of the salts and minerals in your blood (electrolytes), such as low potassium or calcium. Follow these instructions at home: Managing pain and stiffness     Massage, stretch, and relax the muscle. Do this for many minutes at a time. If told, put heat on tight or tense muscles as often as told by your doctor. Use the heat source that your doctor recommends, such as a moist heat pack or a heating pad. Place a towel between your skin and the heat source. Leave the heat on for 20-30 minutes. Remove the heat if your skin turns bright red. This is very important if you are not able to feel pain, heat, or cold. You may have a greater risk of getting burned. If told, put ice on the affected area. This may help if you are sore or have pain after a cramp or spasm. Put ice in a plastic bag. Place a towel between your skin and the bag. Leave the ice on for 20 minutes, 2-3 times a day. Try taking hot showers or baths to  help relax tight muscles. Eating and drinking Drink enough fluid to keep your pee (urine) pale yellow. Eat a healthy diet to help ensure that your muscles work well. This should include: Fruits and vegetables. Lean protein. Whole grains. Low-fat or nonfat dairy products. General instructions If you are having cramps often, avoid intense exercise for several days. Take over-the-counter and prescription medicines only as told by your doctor. Watch for any changes in your symptoms. Keep all follow-up visits as told by your doctor. This is important. Contact a doctor if: Your cramps or spasms get worse or happen more often. Your cramps or spasms do not get better with time. Summary Muscle cramps and spasms are when muscles tighten by themselves. They usually get better within minutes. Cramps and spasms occur most often in the calf muscles of the leg. Massage, stretch, and relax the muscle. This may help the cramp or spasm go away. Drink enough fluid to keep your pee (urine) pale yellow. This information is not intended to replace advice given to you by your health care provider. Make sure you discuss any questions you have with your health care provider. Document Revised: 07/19/2020 Document Reviewed: 07/19/2020 Elsevier Patient Education  [place].

## 2021-09-02 NOTE — Progress Notes (Signed)
Subjective:   By signing my name below, I, [name], attest that this documentation has been prepared under the direction and in the presence of [name], MD 09/02/2021.   Patient ID: Chirstine Mckee, female    DOB: November 18, 1940, 81 y.o.   MRN: [id number]  No chief complaint on file.  HPI Patient is in today for an office visit.  Immunizations: She has been informed about receiving COVID-19, Flu and RSV vaccinations.   Cramping: She complains of cramping in her right hand, feet and legs. She states that her hand cramps primarily during the day and her feet and legs cramp primarily during the night. She says that this has been occurring for several months.   Chronic hip pain: She reports that she has been receiving injections in her hips from Dr. Georgina Snell for her chronic hip pain. She states that she is seeing him every 3 months.  Diet: She states that her diet is not very well managed. She says that she drinks about 3 16 oz bottles of water daily.   Exercise: She states that she does not exercise regularly.   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   Cataract    Cataracts, bilateral 07/25/2015   Cervical cancer screening 07/07/2012   Colon polyps    Elevated serum creatinine    with ACE inhibitor (Normal MRA of renal arteries)   GERD (gastroesophageal reflux disease)    History of cyst of breast    Hoarseness of voice 03/13/2012   Hypertension    Lactose intolerance    Medicare annual wellness visit, subsequent 09/02/2014   Muscle spasm of left lower extremity 03/21/2007   Qualifier: Diagnosis of  By: Wynona Luna    Osteoarthritis    Osteopenia 08/17/2014   Preventative health care 04/05/2016   Shortness of breath    Skin lesion  07/10/2012   Urinary incontinence 07/10/2012   Vitamin D deficiency 04/03/2016   Past Surgical History:  Procedure Laterality Date   APPENDECTOMY     BREAST CYST EXCISION Bilateral    right breast, twice   OVARY SURGERY     right ovary & tube replacement   WRIST GANGLION EXCISION     rt   Family History  Problem Relation Age of Onset   Brain cancer Mother        deceased age 56   Alcohol abuse Mother    Obesity Mother    Other Brother        died of sepsis   Gout Brother    Alcohol abuse Father    Cancer Sister        throat   Pulmonary embolism Daughter    Mental illness Daughter        anxiety   Cancer Daughter        uterine?   Diabetes Maternal Aunt    Diabetes Maternal Aunt    Seizures Maternal Aunt    Birth defects Maternal Aunt    Lupus Grandchild    Sickle cell anemia Grandchild    Colon cancer Neg Hx    Breast cancer Neg Hx    Social History   Socioeconomic History  Marital status: Married    Spouse name: Shanon Brow   Number of children: Not on file   Years of education: Not on file   Highest education level: Not on file  Occupational History   Occupation: retired  Tobacco Use   Smoking status: Former    Types: Cigarettes    Quit date: 01/13/1991    Years since quitting: 30.6   Smokeless tobacco: Never   Tobacco comments:    quit 20 years ago 1/2 ppd x 40 years  Vaping Use   Vaping Use: Never used  Substance and Sexual Activity   Alcohol use: No   Drug use: No   Sexual activity: Never    Comment: lives with husband, no dietary restrictions,   Other Topics Concern   Not on file  Social History Narrative   Former Smoker - quit 20 years ago (1/2 ppd x 40 years)   Widowed   1 Daughter        Social Determinants of Health   Financial Resource Strain: Low Risk  (01/17/2021)   Overall Financial Resource Strain (CARDIA)    Difficulty of Paying Living Expenses: Not hard at all  Food Insecurity: No [place] (01/17/2021)   Hunger Vital Sign     Worried About Running Out of Food in the Last Year: Never true    [place] in the Last Year: Never true  Transportation Needs: No Transportation Needs (01/17/2021)   PRAPARE - Hydrologist (Medical): No    Lack of Transportation (Non-Medical): No  Physical Activity: Inactive (01/17/2021)   Exercise Vital Sign    Days of Exercise per Week: 0 days    Minutes of Exercise per Session: 0 min  Stress: No Stress Concern Present (01/17/2021)   [place]    Feeling of Stress : Not at all  Social Connections: Moderately Integrated (01/17/2021)   Social Connection and Isolation Panel [NHANES]    Frequency of Communication with Friends and Family: More than three times a week    Frequency of Social Gatherings with Friends and Family: More than three times a week    Attends Religious Services: More than 4 times per year    Active Member of [employer] or Organizations: No    Attends Archivist Meetings: Never    Marital Status: Married  Human resources officer Violence: Not At Risk (01/17/2021)   Humiliation, Afraid, Rape, and Kick questionnaire    Fear of Current or Ex-Partner: No    Emotionally Abused: No    Physically Abused: No    Sexually Abused: No   Outpatient Medications Prior to Visit  Medication Sig Dispense Refill   acetaminophen-codeine (TYLENOL #3) 300-30 MG tablet Take 1-2 tablets by mouth every 4 (four) hours as needed for moderate pain or severe pain. 40 tablet 0   albuterol (VENTOLIN HFA) 108 (90 Base) MCG/ACT inhaler Inhale 2 puffs into the lungs every 6 (six) hours as needed for wheezing or shortness of breath. 18 g 5   aspirin 81 MG tablet Take 81 mg by mouth daily.     Biotin w/ Vitamins C & E (HAIR/SKIN/NAILS PO) Take by mouth.     calcium carbonate (OS-CAL) 600 MG TABS Take 600 mg by mouth daily.     Cholecalciferol (VITAMIN D3 PO) Take by mouth.     dicyclomine (BENTYL) 20 MG tablet  Take 1 tablet (20 mg total) by mouth 3 (three) times  daily as needed for spasms. 20 tablet 0   famotidine (PEPCID) 20 MG tablet TAKE 1 TABLET(20 MG) BY MOUTH TWICE DAILY 180 tablet 1   felodipine (PLENDIL) 10 MG 24 hr tablet TAKE 1 TABLET(10 MG) BY MOUTH DAILY 90 tablet 1   Garlic 625 MG CAPS Take 1 capsule by mouth daily.     Krill Oil 1000 MG CAPS Take 1,000 capsules by mouth daily.     Multiple Vitamin (MULTIVITAMIN) tablet Take 1 tablet by mouth daily.     Omega-3 Fatty Acids (OMEGA 3 PO) Take by mouth.     predniSONE (DELTASONE) 10 MG tablet Take by mouth.     SYMBICORT 80-4.5 MCG/ACT inhaler Inhale 2 puffs into the lungs in the morning and at bedtime. 10.2 g 5   XYZAL 5 MG tablet Take 1 tablet (5 mg total) by mouth every evening. 90 tablet 3   No facility-administered medications prior to visit.   Allergies  Allergen Reactions   Allegra [Fexofenadine] Shortness Of Breath   Haemophilus Influenzae Vaccines Nausea And Vomiting   Erythromycin Nausea Only   Oxycodone-Acetaminophen Nausea And Vomiting   Propoxyphene N-Acetaminophen Nausea And Vomiting      severe vomiting   Zocor [Simvastatin] Other (See Comments)      Myalgia   Review of Systems  Musculoskeletal:        (+) Cramping in right hand, legs and feet.      Objective:    Physical Exam Constitutional:      General: She is not in acute distress.    Appearance: Normal appearance. She is not ill-appearing.  HENT:     Head: Normocephalic and atraumatic.     Right Ear: External ear normal.     Left Ear: External ear normal.     Mouth/Throat:     Mouth: Mucous membranes are dry.     Pharynx: Oropharynx is clear.  Eyes:     Extraocular Movements: Extraocular movements intact.     Pupils: Pupils are equal, round, and reactive to light.  Cardiovascular:     Rate and Rhythm: Normal rate and regular rhythm.     Pulses: Normal pulses.     Heart sounds: Normal heart sounds. No murmur heard.    No gallop.  Pulmonary:      Effort: Pulmonary effort is normal. No respiratory distress.     Breath sounds: Normal breath sounds. No wheezing or rales.  Abdominal:     General: Bowel sounds are normal.  Skin:    General: Skin is warm and dry.  Neurological:     Mental Status: She is alert and oriented to person, place, and time.  Psychiatric:        Mood and Affect: Mood normal.        Behavior: Behavior normal.        Judgment: Judgment normal.    LMP  (LMP Unknown)  Wt Readings from Last 3 Encounters:  07/25/21 240 lb 6.4 oz (109 kg)  04/14/21 238 lb (108 kg)  04/07/21 237 lb 3.2 oz (107.6 kg)   Diabetic Foot Exam - Simple   No data filed    Lab Results  Component Value Date   WBC 7.3 04/07/2021   HGB 12.8 04/07/2021   HCT 38.9 04/07/2021   PLT 191.0 04/07/2021   GLUCOSE 70 04/07/2021   CHOL 220 (H) 04/07/2021   TRIG 89.0 04/07/2021   HDL 50.00 04/07/2021   LDLCALC 152 (H) 04/07/2021   ALT 13 04/07/2021  AST 13 04/07/2021   NA 141 04/07/2021   K 4.0 04/07/2021   CL 105 04/07/2021   CREATININE 1.10 04/07/2021   BUN 17 04/07/2021   CO2 27 04/07/2021   TSH 3.02 04/07/2021   HGBA1C 5.5 04/07/2021   Lab Results  Component Value Date   TSH 3.02 04/07/2021   Lab Results  Component Value Date   WBC 7.3 04/07/2021   HGB 12.8 04/07/2021   HCT 38.9 04/07/2021   MCV 89.0 04/07/2021   PLT 191.0 04/07/2021   Lab Results  Component Value Date   NA 141 04/07/2021   K 4.0 04/07/2021   CO2 27 04/07/2021   GLUCOSE 70 04/07/2021   BUN 17 04/07/2021   CREATININE 1.10 04/07/2021   BILITOT 0.5 04/07/2021   ALKPHOS 54 04/07/2021   AST 13 04/07/2021   ALT 13 04/07/2021   PROT 7.1 04/07/2021   ALBUMIN 4.2 04/07/2021   CALCIUM 9.4 04/07/2021   ANIONGAP 7 02/26/2018   EGFR 45 03/10/2021   GFR 47.24 (L) 04/07/2021   Lab Results  Component Value Date   CHOL 220 (H) 04/07/2021   Lab Results  Component Value Date   HDL 50.00 04/07/2021   Lab Results  Component Value Date   LDLCALC 152  (H) 04/07/2021   Lab Results  Component Value Date   TRIG 89.0 04/07/2021   Lab Results  Component Value Date   CHOLHDL 4 04/07/2021   Lab Results  Component Value Date   HGBA1C 5.5 04/07/2021      Assessment & Plan:   Problem List Items Addressed This Visit   None  No orders of the defined types were placed in this encounter.  I, [name], personally preformed the services described in this documentation.  All medical record entries made by the scribe were at my direction and in my presence.  I have reviewed the chart and discharge instructions (if applicable) and agree that the record reflects my personal performance and is accurate and complete. 09/02/2021  I,Mohammed Iqbal,acting as a scribe for Penni Homans, MD.,have documented all relevant documentation on the behalf of Penni Homans, MD,as directed by  Penni Homans, MD while in the presence of Penni Homans, MD.  [name]

## 2021-09-03 LAB — CBC
HCT: 42.1 % (ref 36.0–46.0)
Hemoglobin: 13.7 g/dL (ref 12.0–15.0)
MCHC: 32.7 g/dL (ref 30.0–36.0)
MCV: 89.7 fl (ref 78.0–100.0)
Platelets: 184 10*3/uL (ref 150.0–400.0)
RBC: 4.69 Mil/uL (ref 3.87–5.11)
RDW: 15.1 % (ref 11.5–15.5)
WBC: 8.2 10*3/uL (ref 4.0–10.5)

## 2021-09-03 LAB — COMPREHENSIVE METABOLIC PANEL
ALT: 15 U/L (ref 0–35)
AST: 17 U/L (ref 0–37)
Albumin: 4.3 g/dL (ref 3.5–5.2)
Alkaline Phosphatase: 55 U/L (ref 39–117)
BUN: 20 mg/dL (ref 6–23)
CO2: 29 mEq/L (ref 19–32)
Calcium: 10.1 mg/dL (ref 8.4–10.5)
Chloride: 104 mEq/L (ref 96–112)
Creatinine, Ser: 1.22 mg/dL — ABNORMAL HIGH (ref 0.40–1.20)
GFR: 41.6 mL/min — ABNORMAL LOW (ref >60.00)
Glucose, Bld: 78 mg/dL (ref 70–99)
Potassium: 4.4 mEq/L (ref 3.5–5.1)
Sodium: 142 mEq/L (ref 135–145)
Total Bilirubin: 0.3 mg/dL (ref 0.2–1.2)
Total Protein: 1.4 g/dL — ABNORMAL LOW (ref 6.0–8.3)

## 2021-09-03 LAB — LIPID PANEL
Cholesterol: 214 mg/dL — ABNORMAL HIGH (ref 0–200)
HDL: 49.6 mg/dL (ref >39.00)
LDL Cholesterol: 141 mg/dL — ABNORMAL HIGH (ref 0–99)
NonHDL: 164.1
Total CHOL/HDL Ratio: 4
Triglycerides: 114 mg/dL (ref 0.0–149.0)
VLDL: 22.8 mg/dL (ref 0.0–40.0)

## 2021-09-03 LAB — MAGNESIUM: Magnesium: 2.1 mg/dL (ref 1.5–2.5)

## 2021-09-03 LAB — CK: Total CK: 126 U/L (ref 7–177)

## 2021-09-03 LAB — TSH: TSH: 3.27 u[IU]/mL (ref 0.35–5.50)

## 2021-09-03 LAB — HEMOGLOBIN A1C: Hgb A1c MFr Bld: 5.6 % (ref 4.6–6.5)

## 2021-09-03 LAB — VITAMIN D 25 HYDROXY (VIT D DEFICIENCY, FRACTURES): VITD: 50.66 ng/mL (ref 30.00–100.00)

## 2021-09-03 LAB — HIGH SENSITIVITY CRP: CRP, High Sensitivity: 17.1 mg/L — ABNORMAL HIGH (ref 0.000–5.000)

## 2021-09-03 NOTE — Assessment & Plan Note (Signed)
Well controlled, no changes to meds. Encouraged heart healthy diet such as the DASH diet and exercise as tolerated.  °

## 2021-09-03 NOTE — Assessment & Plan Note (Signed)
Supplement and monitor 

## 2021-09-03 NOTE — Assessment & Plan Note (Signed)
Hydrate and monitor. It has worsened recently. Try Hyland's leg cramp medicine and let us know if worsens.

## 2021-09-03 NOTE — Assessment & Plan Note (Signed)
Encourage heart healthy diet such as MIND or DASH diet, increase exercise, avoid trans fats, simple carbohydrates and processed foods, consider a krill or fish or flaxseed oil cap daily.  °

## 2021-09-24 ENCOUNTER — Other Ambulatory Visit: Payer: Self-pay

## 2021-09-24 DIAGNOSIS — J441 Chronic obstructive pulmonary disease with (acute) exacerbation: Secondary | ICD-10-CM

## 2021-09-24 MED ORDER — SYMBICORT 80-4.5 MCG/ACT IN AERO: 2.0000 | INHALATION_SPRAY | Freq: Two times a day (BID) | RESPIRATORY_TRACT | 5 refills | Status: DC

## 2021-10-21 NOTE — Progress Notes (Unsigned)
I, Peterson Lombard, LAT, ATC acting as a scribe for Lynne Leader, MD.  Terri Mckee is a 81 y.o. 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  Patient was last seen by Dr. CGeorgina Snellon 07/25/2021 and was given bilateral femoral acetabular steroid injections.  Today, patient reports hip pain just returned over the last couple days. Pt locates pain to the anterior-lateral aspect of the right hip.  She does not have much left hip pain.  She does not have much anterior hip pain on either side.  Dx imaging: 04/14/21 Bilat hips XR             06/04/20 L-spine MRI             04/17/20 Bilat hips XR  Pertinent review of systems: No fevers or chills  Relevant historical information: Bilateral hip arthritis   Exam:  BP 130/86   Pulse (!) 52   Ht '5\' 9"'$  (1.753 m)   Wt 246 lb (111.6 kg)   LMP  (LMP Unknown)   SpO2 100%   BMI 36.33 kg/m  General: Well Developed, well nourished, and in no acute distress.   MSK: Hips bilaterally normal.  Normal motion some pain with rotation. Tender palpation right greater trochanter bursa.  Hip abduction strength is diminished. Mild antalgic gait.    Lab and Radiology Results  Procedure: Real-time Ultrasound Guided Injection of right hip greater trochanter bursa Device: Philips Affiniti 50G Images permanently stored and available for review in PACS Verbal informed consent obtained.  Discussed risks and benefits of procedure. Warned about infection, bleeding, hyperglycemia damage to structures among others. Patient expresses understanding and agreement Time-out conducted.   Noted no overlying erythema, induration, or other signs of local infection.   Skin prepped in a sterile fashion.   Local anesthesia: Topical Ethyl chloride.   With sterile technique and under real time ultrasound guidance: 40 mg of Kenalog and 2 mL of Marcaine injected into right lateral hip  greater trochanter bursa. Fluid seen entering the bursa.   Completed without difficulty   Pain immediately resolved suggesting accurate placement of the medication.   Advised to call if fevers/chills, erythema, induration, drainage, or persistent bleeding.   Images permanently stored and available for review in the ultrasound unit.  Impression: Technically successful ultrasound guided injection.         Assessment and Plan: 81y.o. female with right lateral hip pain due to trochanteric bursitis.  Previously she has had bilateral intra-articular hip injections for pain thought to be due to arthritis.  This pain is not present today therefore we are not going to do intra-articular injections.  She can return as needed for intra-articular injections when the anterior pain returns.  Spent time talking about this plan with Dot who expresses understanding and agreement.   PDMP not reviewed this encounter. Orders Placed This Encounter  Procedures   [place] JOINT SPACE STRUCTURES LOW BILAT(NO LINKED CHARGES)    Order Specific Question:   Reason for Exam (SYMPTOM  OR DIAGNOSIS REQUIRED)    Answer:   Bilateral hip pain    Order Specific Question:   [place]    [place]  No orders of the defined types were placed in this encounter.    Discussed warning signs or symptoms. Please see discharge instructions. Patient expresses understanding.   The above documentation  has been reviewed and is accurate and complete Lynne Leader, M.D.

## 2021-10-22 ENCOUNTER — Ambulatory Visit: Payer: Medicare Other | Admitting: Family Medicine

## 2021-10-22 ENCOUNTER — Ambulatory Visit: Payer: Self-pay

## 2021-10-22 VITALS — BP 130/86 | HR 52 | Ht 69.0 in | Wt 246.0 lb

## 2021-10-22 DIAGNOSIS — M160 Bilateral primary osteoarthritis of hip: Secondary | ICD-10-CM

## 2021-10-22 DIAGNOSIS — M25551 Pain in right hip: Secondary | ICD-10-CM | POA: Diagnosis not present

## 2021-10-22 NOTE — Patient Instructions (Addendum)
Thank you for coming in today.   You received an injection today. Seek immediate medical attention if the joint becomes red, extremely painful, or is oozing fluid.   Check back as needed 

## 2021-10-24 ENCOUNTER — Ambulatory Visit: Payer: Medicare Other | Admitting: Family Medicine

## 2022-01-21 ENCOUNTER — Ambulatory Visit (INDEPENDENT_AMBULATORY_CARE_PROVIDER_SITE_OTHER): Payer: Medicare Other | Admitting: Family Medicine

## 2022-01-21 ENCOUNTER — Ambulatory Visit: Payer: Self-pay

## 2022-01-21 VITALS — BP 164/92 | HR 68 | Ht 69.0 in | Wt 239.0 lb

## 2022-01-21 DIAGNOSIS — M160 Bilateral primary osteoarthritis of hip: Secondary | ICD-10-CM

## 2022-01-21 DIAGNOSIS — M7061 Trochanteric bursitis, right hip: Secondary | ICD-10-CM

## 2022-01-21 NOTE — Patient Instructions (Signed)
Thank you for coming in today.   Recheck in about 3 months.   Call or go to the ER if you develop a large red swollen joint with extreme pain or oozing puss.

## 2022-01-21 NOTE — Progress Notes (Signed)
   I, Peterson Lombard, LAT, ATC acting as a scribe for Lynne Leader, MD.  Terri Mckee is a 82 y.o. female who presents to [place] at [facility] today for her 42-monthf/u for R hip pain due to DJD. Pt was last seen by Dr. CGeorgina Snellon 10/22/21 and was given a R GT steroid injection. Today, pt reports R hip pain is not as severe as it was. Pt locates pain to the lateral aspect of her R hip.   Dx imaging: 04/14/21 Bilat hips XR             06/04/20 L-spine MRI             04/17/20 Bilat hips XR  Pertinent review of systems: No fevers or chills  Relevant historical information: Hypertension   Exam:  BP (!) 164/92   Pulse 68   Ht '5\' 9"'$  (1.753 m)   Wt 239 lb (108.4 kg)   LMP  (LMP Unknown)   SpO2 100%   BMI 35.29 kg/m  General: Well Developed, well nourished, and in no acute distress.   MSK: Right hip: Normal appearing. Tender palpation greater trochanter.  Decreased range of motion.  Hip abduction strength is diminished. Patient uses a cane to ambulate.    Lab and Radiology Results  Hip greater trochanteric injection: right Consent obtained and timeout performed. Area of maximum tenderness palpated and identified. Skin cleaned with alcohol, cold spray applied. A spinal needle was used to access the greater trochanteric bursa. '40mg'$  of Kenalog and 2 mL of Marcaine were used to inject the trochanteric bursa. Patient tolerated the procedure well.         Assessment and Plan: 82y.o. female with right lateral hip pain.  This is recurrent greater trochanteric bursitis.  This is an acute exacerbation of a chronic problem.  Plan for repeat injection today.  Continue home exercise program.  Plan to recheck in about 3 months.    Discussed warning signs or symptoms. Please see discharge instructions. Patient expresses understanding.   The above documentation has been reviewed and is accurate and complete ELynne Leader M.D.

## 2022-01-26 ENCOUNTER — Ambulatory Visit (INDEPENDENT_AMBULATORY_CARE_PROVIDER_SITE_OTHER): Payer: Medicare Other | Admitting: *Deleted

## 2022-01-26 DIAGNOSIS — Z0000 Encounter for general adult medical examination without abnormal findings: Secondary | ICD-10-CM

## 2022-01-26 NOTE — Patient Instructions (Signed)
Terri Mckee , Thank you for taking time to come for your Medicare Wellness Visit. I appreciate your ongoing commitment to your health goals. Please review the following plan we discussed and let me know if I can assist you in the future.   These are the goals we discussed:  Goals      Increase physical activity        This is a list of the screening recommended for you and due dates:  Health Maintenance  Topic Date Due   Zoster (Shingles) Vaccine (1 of 2) Never done   Colon Cancer Screening  10/05/2016   Medicare Annual Wellness Visit  01/27/2023   DTaP/Tdap/Td vaccine (2 - Tdap) 07/24/2025   Pneumonia Vaccine  Completed   DEXA scan (bone density measurement)  Completed   HPV Vaccine  Aged Out   Flu Shot  Discontinued   COVID-19 Vaccine  Discontinued     Next appointment: Follow up in one year for your annual wellness visit.   Preventive Care 82 Years and Older, Female Preventive care refers to lifestyle choices and visits with your health care provider that can promote health and wellness. What does preventive care include? A yearly physical exam. This is also called an annual well check. Dental exams once or twice a year. Routine eye exams. Ask your health care provider how often you should have your eyes checked. Personal lifestyle choices, including: Daily care of your teeth and gums. Regular physical activity. Eating a healthy diet. Avoiding tobacco and drug use. Limiting alcohol use. Practicing safe sex. Taking low-dose aspirin every day. Taking vitamin and mineral supplements as recommended by your health care provider. What happens during an annual well check? The services and screenings done by your health care provider during your annual well check will depend on your age, overall health, lifestyle risk factors, and family history of disease. Counseling  Your health care provider may ask you questions about your: Alcohol use. Tobacco use. Drug  use. Emotional well-being. Home and relationship well-being. Sexual activity. Eating habits. History of falls. Memory and ability to understand (cognition). Work and work Statistician. Reproductive health. Screening  You may have the following tests or measurements: Height, weight, and BMI. Blood pressure. Lipid and cholesterol levels. These may be checked every 5 years, or more frequently if you are over 62 years old. Skin check. Lung cancer screening. You may have this screening every year starting at age 90 if you have a 30-pack-year history of smoking and currently smoke or have quit within the past 15 years. Fecal occult blood test (FOBT) of the stool. You may have this test every year starting at age 82. Flexible sigmoidoscopy or colonoscopy. You may have a sigmoidoscopy every 5 years or a colonoscopy every 10 years starting at age 74. Hepatitis C blood test. Hepatitis B blood test. Sexually transmitted disease (STD) testing. Diabetes screening. This is done by checking your blood sugar (glucose) after you have not eaten for a while (fasting). You may have this done every 1-3 years. Bone density scan. This is done to screen for osteoporosis. You may have this done starting at age 35. Mammogram. This may be done every 1-2 years. Talk to your health care provider about how often you should have regular mammograms. Talk with your health care provider about your test results, treatment options, and if necessary, the need for more tests. Vaccines  Your health care provider may recommend certain vaccines, such as: Influenza vaccine. This is recommended every year.  Tetanus, diphtheria, and acellular pertussis (Tdap, Td) vaccine. You may need a Td booster every 10 years. Zoster vaccine. You may need this after age 33. Pneumococcal 13-valent conjugate (PCV13) vaccine. One dose is recommended after age 38. Pneumococcal polysaccharide (PPSV23) vaccine. One dose is recommended after age  36. Talk to your health care provider about which screenings and vaccines you need and how often you need them. This information is not intended to replace advice given to you by your health care provider. Make sure you discuss any questions you have with your health care provider. Document Released: 01/25/2015 Document Revised: 09/18/2015 Document Reviewed: 10/30/2014 Elsevier Interactive Patient Education  2017 [place] Prevention in the Home Falls can cause injuries. They can happen to people of all ages. There are many things you can do to make your home safe and to help prevent falls. What can I do on the outside of my home? Regularly fix the edges of walkways and driveways and fix any cracks. Remove anything that might make you trip as you walk through a door, such as a raised step or threshold. Trim any bushes or trees on the path to your home. Use bright outdoor lighting. Clear any walking paths of anything that might make someone trip, such as rocks or tools. Regularly check to see if handrails are loose or broken. Make sure that both sides of any steps have handrails. Any raised decks and porches should have guardrails on the edges. Have any leaves, snow, or ice cleared regularly. Use sand or salt on walking paths during winter. Clean up any spills in your garage right away. This includes oil or grease spills. What can I do in the bathroom? Use night lights. Install grab bars by the toilet and in the tub and shower. Do not use towel bars as grab bars. Use non-skid mats or decals in the tub or shower. If you need to sit down in the shower, use a plastic, non-slip stool. Keep the floor dry. Clean up any water that spills on the floor as soon as it happens. Remove soap buildup in the tub or shower regularly. Attach bath mats securely with double-sided non-slip rug tape. Do not have throw rugs and other things on the floor that can make you trip. What can I do in the  bedroom? Use night lights. Make sure that you have a light by your bed that is easy to reach. Do not use any sheets or blankets that are too big for your bed. They should not hang down onto the floor. Have a firm chair that has side arms. You can use this for support while you get dressed. Do not have throw rugs and other things on the floor that can make you trip. What can I do in the kitchen? Clean up any spills right away. Avoid walking on wet floors. Keep items that you use a lot in easy-to-reach places. If you need to reach something above you, use a strong step stool that has a grab bar. Keep electrical cords out of the way. Do not use floor polish or wax that makes floors slippery. If you must use wax, use non-skid floor wax. Do not have throw rugs and other things on the floor that can make you trip. What can I do with my stairs? Do not leave any items on the stairs. Make sure that there are handrails on both sides of the stairs and use them. Fix handrails that are broken or loose.  Make sure that handrails are as long as the stairways. Check any carpeting to make sure that it is firmly attached to the stairs. Fix any carpet that is loose or worn. Avoid having throw rugs at the top or bottom of the stairs. If you do have throw rugs, attach them to the floor with carpet tape. Make sure that you have a light switch at the top of the stairs and the bottom of the stairs. If you do not have them, ask someone to add them for you. What else can I do to help prevent falls? Wear shoes that: Do not have high heels. Have rubber bottoms. Are comfortable and fit you well. Are closed at the toe. Do not wear sandals. If you use a stepladder: Make sure that it is fully opened. Do not climb a closed stepladder. Make sure that both sides of the stepladder are locked into place. Ask someone to hold it for you, if possible. Clearly mark and make sure that you can see: Any grab bars or  handrails. First and last steps. Where the edge of each step is. Use tools that help you move around (mobility aids) if they are needed. These include: Canes. Walkers. Scooters. Crutches. Turn on the lights when you go into a dark area. Replace any light bulbs as soon as they burn out. Set up your furniture so you have a clear path. Avoid moving your furniture around. If any of your floors are uneven, fix them. If there are any pets around you, be aware of where they are. Review your medicines with your doctor. Some medicines can make you feel dizzy. This can increase your chance of falling. Ask your doctor what other things that you can do to help prevent falls. This information is not intended to replace advice given to you by your health care provider. Make sure you discuss any questions you have with your health care provider. Document Released: 10/25/2008 Document Revised: 06/06/2015 Document Reviewed: 02/02/2014 Elsevier Interactive Patient Education  2017 [employer].

## 2022-01-26 NOTE — Progress Notes (Signed)
Subjective:   Jeanann Balinski is a 82 y.o. female who presents for Medicare Annual (Subsequent) preventive examination.  I connected with  Jed Limerick on 01/26/22 by a audio enabled telemedicine application and verified that I am speaking with the correct person using two identifiers.  Patient Location: Home  Provider Location: Office/Clinic  I discussed the limitations of evaluation and management by telemedicine. The patient expressed understanding and agreed to proceed.   Review of Systems    Defer to PCP Cardiac Risk Factors include: advanced age (>60mn, >>68women);dyslipidemia;hypertension;obesity (BMI >30kg/m2)     Objective:    There were no vitals filed for this visit. There is no height or weight on file to calculate BMI.     01/26/2022   11:14 AM 01/17/2021   11:03 AM 12/28/2019    2:43 PM 02/14/2019    1:10 PM 12/06/2018    3:24 PM 02/26/2018   11:42 AM 05/24/2015    9:33 AM  Advanced Directives  Does Patient Have a Medical Advance Directive? No No No No No No No  Does patient want to make changes to medical advance directive?  No - Patient declined       Would patient like information on creating a medical advance directive? No - Patient declined  No - Patient declined No - Patient declined No - Patient declined  No - patient declined information    Current Medications (verified) Outpatient Encounter Medications as of 01/26/2022  Medication Sig   acetaminophen-codeine (TYLENOL #3) 300-30 MG tablet Take 1-2 tablets by mouth every 4 (four) hours as needed for moderate pain or severe pain.   albuterol (VENTOLIN HFA) 108 (90 Base) MCG/ACT inhaler Inhale 2 puffs into the lungs every 6 (six) hours as needed for wheezing or shortness of breath.   aspirin 81 MG tablet Take 81 mg by mouth daily.   Biotin w/ Vitamins C & E (HAIR/SKIN/NAILS PO) Take by mouth.   calcium carbonate (OS-CAL) 600 MG TABS Take 600 mg by mouth daily.   Cholecalciferol  (VITAMIN D3 PO) Take by mouth.   famotidine (PEPCID) 20 MG tablet TAKE 1 TABLET(20 MG) BY MOUTH TWICE DAILY   felodipine (PLENDIL) 10 MG 24 hr tablet TAKE 1 TABLET(10 MG) BY MOUTH DAILY   Garlic 3034MG CAPS Take 1 capsule by mouth daily.   Krill Oil 1000 MG CAPS Take 1,000 capsules by mouth daily.   Multiple Vitamin (MULTIVITAMIN) tablet Take 1 tablet by mouth daily.   Omega-3 Fatty Acids (OMEGA 3 PO) Take by mouth.   SYMBICORT 80-4.5 MCG/ACT inhaler Inhale 2 puffs into the lungs in the morning and at bedtime.   XYZAL 5 MG tablet Take 1 tablet (5 mg total) by mouth every evening.   No facility-administered encounter medications on file as of 01/26/2022.    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   Cataract    Cataracts, bilateral 07/25/2015   Cervical cancer screening 07/07/2012   Colon polyps    Elevated serum creatinine    with ACE inhibitor (Normal MRA of renal arteries)  GERD (gastroesophageal reflux disease)    History of cyst of breast    Hoarseness of voice 03/13/2012   Hypertension    Lactose intolerance    Medicare annual wellness visit, subsequent 09/02/2014   Muscle spasm of left lower extremity 03/21/2007   Qualifier: Diagnosis of  By: Wynona Luna    Osteoarthritis    Osteopenia 08/17/2014   Preventative health care 04/05/2016   Shortness of breath    Skin lesion 07/10/2012   Urinary incontinence 07/10/2012   Vitamin D deficiency 04/03/2016   Past Surgical History:  Procedure Laterality Date   APPENDECTOMY     BREAST CYST EXCISION Bilateral    right breast,  twice   OVARY SURGERY     right ovary & tube replacement   WRIST GANGLION EXCISION     rt   Family History  Problem Relation Age of Onset   Brain cancer Mother        deceased age 77   Alcohol abuse Mother    Obesity Mother    Other Brother        died of sepsis   Gout Brother    Alcohol abuse Father    Cancer Sister        throat   Pulmonary embolism Daughter    Mental illness Daughter        anxiety   Cancer Daughter        uterine?   Diabetes Maternal Aunt    Diabetes Maternal Aunt    Seizures Maternal Aunt    Birth defects Maternal Aunt    Lupus Grandchild    Sickle cell anemia Grandchild    Colon cancer Neg Hx    Breast cancer Neg Hx    Social History   Socioeconomic History   Marital status: Married    Spouse name: Shanon Brow   Number of children: Not on file   Years of education: Not on file   Highest education level: Not on file  Occupational History   Occupation: retired  Tobacco Use   Smoking status: Former    Types: Cigarettes    Quit date: 01/13/1991    Years since quitting: 31.0   Smokeless tobacco: Never   Tobacco comments:    quit 20 years ago 1/2 ppd x 40 years  Vaping Use   Vaping Use: Never used  Substance and Sexual Activity   Alcohol use: No   Drug use: No   Sexual activity: Never    Comment: lives with husband, no dietary restrictions,   Other Topics Concern   Not on file  Social History Narrative   Former Smoker - quit 20 years ago (1/2 ppd x 40 years)   Widowed   1 Daughter        Social Determinants of Health   Financial Resource Strain: Low Risk  (01/17/2021)   Overall Financial Resource Strain (CARDIA)    Difficulty of Paying Living Expenses: Not hard at all  Food Insecurity: No Food Insecurity (01/26/2022)   Hunger Vital Sign    Worried About Running Out of Food in the Last Year: Never true    [place] in the Last Year: Never true  Transportation Needs: No Transportation Needs (01/26/2022)   PRAPARE - Armed forces logistics/support/administrative officer (Medical): No    Lack of Transportation (Non-Medical): No  Physical Activity: Inactive (01/17/2021)   Exercise Vital Sign    Days of Exercise per Week: 0 days  Minutes of Exercise per Session: 0 min  Stress: No Stress Concern Present (01/17/2021)   [place]    Feeling of Stress : Not at all  Social Connections: Moderately Integrated (01/17/2021)   Social Connection and Isolation Panel [NHANES]    Frequency of Communication with Friends and Family: More than three times a week    Frequency of Social Gatherings with Friends and Family: More than three times a week    Attends Religious Services: More than 4 times per year    Active Member of [employer] or Organizations: No    Attends Music therapist: Never    Marital Status: Married    Tobacco Counseling Counseling given: Not Answered Tobacco comments: quit 20 years ago 1/2 ppd x 40 years   Clinical Intake:  Pre-visit preparation completed: Yes  Pain : No/denies pain  Diabetes: No  How often do you need to have someone help you when you read instructions, pamphlets, or other written materials from your doctor or pharmacy?: 1 - Never  Activities of Daily Living    01/26/2022   11:15 AM  In your present state of health, do you have any difficulty performing the following activities:  Hearing? 1  Comment hearing loss  Vision? 0  Difficulty concentrating or making decisions? 0  Walking or climbing stairs? 1  Dressing or bathing? 0  Doing errands, shopping? 0  Preparing Food and eating ? N  Using the Toilet? N  In the past six months, have you accidently leaked urine? Y  Do you have problems with loss of bowel control? N  Managing your Medications? N  Managing your Finances? N  Housekeeping or managing your Housekeeping? N    Patient Care Team: [name], MD as PCP - General (Family Medicine) Berle Mull, MD as  Consulting Physician (Family Medicine) Irene Shipper, MD as Consulting Physician (Gastroenterology) Bjorn Loser, MD as Consulting Physician (Urology) Druscilla Brownie, MD as Consulting Physician (Dermatology) Latanya Maudlin, MD as Consulting Physician (Orthopedic Surgery)  Indicate any recent Medical Services you may have received from other than [facility] providers in the past year (date may be approximate).     Assessment:   This is a routine wellness examination for Sacha.  Hearing/Vision screen No results found.  Dietary issues and exercise activities discussed: Current Exercise Habits: The patient does not participate in regular exercise at present, Exercise limited by: orthopedic condition(s)   Goals Addressed   None    Depression Screen    01/26/2022   11:13 AM 09/02/2021    3:53 PM 04/07/2021    2:31 PM 01/17/2021   11:06 AM 12/28/2019    2:46 PM 12/06/2018    3:27 PM 03/15/2018    9:50 AM  PHQ 2/9 Scores  PHQ - 2 Score 0 0 0 0 0 0 1  PHQ- 9 Score       4  Exception Documentation       Medical reason    Fall Risk    01/26/2022   11:14 AM 09/02/2021    3:53 PM 04/07/2021    2:29 PM 01/17/2021   11:04 AM 12/28/2019    2:44 PM  [place] in the past year? 0 0 0 0 1  Number falls in past yr: 0 0 0 0 0  Injury with Fall? 0 0 0 0 0  Risk for fall due to : No Fall Risks  Impaired balance/gait;Impaired mobility  Follow up Falls evaluation completed  Falls evaluation completed Falls prevention discussed Falls prevention discussed    FALL RISK PREVENTION PERTAINING TO THE HOME:  Any stairs in or around the home? Yes  If so, are there any without handrails? No  Home free of loose throw rugs in walkways, pet beds, electrical cords, etc? Yes  Adequate lighting in your home to reduce risk of falls? Yes   ASSISTIVE DEVICES UTILIZED TO PREVENT FALLS:  Life alert? No  Use of a cane, walker or w/c? Yes  Grab bars in the bathroom? No  Shower chair or bench in  shower? Yes  Elevated toilet seat or a handicapped toilet? Yes   TIMED UP AND GO:  Was the test performed?  No, audio visit .    Cognitive Function:    01/26/2022   11:22 AM  MMSE - Mini Mental State Exam  Not completed: Unable to complete        Immunizations Immunization History  Administered Date(s) Administered   PFIZER(Purple Top)SARS-COV-2 Vaccination 01/27/2019, 02/17/2019, 10/18/2019   Pneumococcal Conjugate-13 09/21/2013   Pneumococcal Polysaccharide-23 10/31/2010, 10/05/2016   Td 07/25/2015    TDAP status: Up to date  Flu Vaccine status: Declined, Education has been provided regarding the importance of this vaccine but patient still declined. Advised may receive this vaccine at local pharmacy or Health Dept. Aware to provide a copy of the vaccination record if obtained from local pharmacy or Health Dept. Verbalized acceptance and understanding.  Pneumococcal vaccine status: Up to date  Covid-19 vaccine status: Declined, Education has been provided regarding the importance of this vaccine but patient still declined. Advised may receive this vaccine at local pharmacy or Health Dept.or vaccine clinic. Aware to provide a copy of the vaccination record if obtained from local pharmacy or Health Dept. Verbalized acceptance and understanding.  Qualifies for Shingles Vaccine? Yes   Zostavax completed No   Shingrix Completed?: No.    Education has been provided regarding the importance of this vaccine. Patient has been advised to call insurance company to determine out of pocket expense if they have not yet received this vaccine. Advised may also receive vaccine at local pharmacy or Health Dept. Verbalized acceptance and understanding.  Screening Tests Health Maintenance  Topic Date Due   Zoster Vaccines- Shingrix (1 of 2) Never done   COLONOSCOPY (Pts 45-43yr Insurance coverage will need to be confirmed)  10/05/2016   Medicare Annual Wellness (AWV)  01/17/2022    DTaP/Tdap/Td (2 - Tdap) 07/24/2025   Pneumonia Vaccine 82 Years old  Completed   DEXA SCAN  Completed   HPV VACCINES  Aged Out   INFLUENZA VACCINE  Discontinued   COVID-19 Vaccine  Discontinued    Health Maintenance  Health Maintenance Due  Topic Date Due   Zoster Vaccines- Shingrix (1 of 2) Never done   COLONOSCOPY (Pts 45-470yrInsurance coverage will need to be confirmed)  10/05/2016   Medicare Annual Wellness (AWV)  01/17/2022    Colorectal cancer screening: No longer required.   Mammogram status: Completed 07/31/21. Repeat every year  Bone Density status: Completed 06/18/20. Results reflect: Bone density results: OSTEOPENIA. Repeat every 2 years.  Lung Cancer Screening: (Low Dose CT Chest recommended if Age 82-80ears, 30 pack-year currently smoking OR have quit w/in 15years.) does not qualify.   Additional Screening:  Hepatitis C Screening: does not qualify  Vision Screening: Recommended annual ophthalmology exams for early detection of glaucoma and other disorders of the eye. Is the patient up  to date with their annual eye exam?  Yes  Who is the provider or what is the name of the office in which the patient attends annual eye exams? [facility] If pt is not established with a provider, would they like to be referred to a provider to establish care? No .   Dental Screening: Recommended annual dental exams for proper oral hygiene  Community Resource Referral / Chronic Care Management: CRR required this visit?  No   CCM required this visit?  No      Plan:     I have personally reviewed and noted the following in the patient's chart:   Medical and social history Use of alcohol, tobacco or illicit drugs  Current medications and supplements including opioid prescriptions. Patient is not currently taking opioid prescriptions. Functional ability and status Nutritional status Physical activity Advanced directives List of other physicians Hospitalizations,  surgeries, and ER visits in previous 12 months Vitals Screenings to include cognitive, depression, and falls Referrals and appointments  In addition, I have reviewed and discussed with patient certain preventive protocols, quality metrics, and best practice recommendations. A written personalized care plan for preventive services as well as general preventive health recommendations were provided to patient.   Due to this being a telephonic visit, the after visit summary with patients personalized plan was offered to patient via mail or my-chart. Per request, patient was mailed a copy of AVS.   Beatris Ship, [place]   01/26/2022   Nurse Notes: None

## 2022-01-29 LAB — LAB REPORT - SCANNED: EGFR: 42

## 2022-04-21 ENCOUNTER — Ambulatory Visit: Payer: Medicare Other | Admitting: Family Medicine

## 2022-04-21 NOTE — Progress Notes (Deleted)
   Rubin Payor, PhD, LAT, ATC acting as a scribe for Terri Graham, MD.  Terri Mckee is a 82 y.o. female who presents to [facility] today for 53-month follow-up bilateral hip pain.  Patient was last seen by Dr. Denyse Amass on 01/21/2022 and was given a right GT steroid injection and advised to continue HEP.  Today, patient reports***  Dx imaging: 04/14/21 Bilat hips XR             06/04/20 L-spine MRI             04/17/20 Bilat hips XR   Pertinent review of systems: ***  Relevant historical information: ***   Exam:  LMP  (LMP Unknown)  General: Well Developed, well nourished, and in no acute distress.   MSK: ***    Lab and Radiology Results No results found for this or any previous visit (from the past 72 hour(s)). No results found.     Assessment and Plan: 82 y.o. female with ***   PDMP not reviewed this encounter. No orders of the defined types were placed in this encounter.  No orders of the defined types were placed in this encounter.    Discussed warning signs or symptoms. Please see discharge instructions. Patient expresses understanding.   ***

## 2022-04-29 ENCOUNTER — Ambulatory Visit: Payer: Medicare Other | Admitting: Family Medicine

## 2022-04-29 VITALS — BP 168/88 | HR 48 | Ht 69.0 in | Wt 233.0 lb

## 2022-04-29 DIAGNOSIS — M7061 Trochanteric bursitis, right hip: Secondary | ICD-10-CM

## 2022-04-29 NOTE — Progress Notes (Signed)
   Rubin Payor, PhD, LAT, ATC acting as a scribe for Clementeen Graham, MD.  Terri Mckee is a 82 y.o. female who presents to [facility] at [facility] today for 35-month follow-up R hip pain.  Patient was last seen by Dr. Denyse Amass on 01/21/2022 and was given a right GT steroid injection and advised to continue HEP.  Today, patient reports R hip pain returned over the last 2-wks. Pt locates pain to the lateral aspect of her R hip.  She has been doing her home exercise program.  Dx imaging: 04/14/21 Bilat hips XR             06/04/20 L-spine MRI             04/17/20 Bilat hips XR   Pertinent review of systems: No fevers or chills  Relevant historical information: Hypertension   Exam:  BP (!) 168/88   Pulse (!) 48   Ht  (1.753 m)   Wt 233 lb (105.7 kg)   LMP  (LMP Unknown)   SpO2 97%   BMI 34.41 kg/m  General: Well Developed, well nourished, and in no acute distress.   MSK: Right hip: Normal-appearing Tender palpation greater trochanter.  Decreased range of motion limited abduction and rotation.    Lab and Radiology Results  Hip greater trochanteric injection: right Consent obtained and timeout performed. Area of maximum tenderness palpated and identified. Skin cleaned with alcohol, cold spray applied. A spinal needle was used to access the greater trochanteric bursa.  of Kenalog and 2 mL of Marcaine were used to inject the trochanteric bursa. Patient tolerated the procedure well.     Assessment and Plan: 82 y.o. female with right lateral hip pain due to hip abductor tendinopathy and trochanteric bursitis. Proceed with injection today and continued home exercise program.  Recheck in 3 months.     Discussed warning signs or symptoms. Please see discharge instructions. Patient expresses understanding.   The above documentation has been reviewed and is accurate and complete Clementeen Graham, M.D.

## 2022-04-29 NOTE — Patient Instructions (Signed)
Thank you for coming in today. Call or go to the ER if you develop a large red swollen joint with extreme pain or oozing puss.  Recheck in 3 months.   

## 2022-05-05 ENCOUNTER — Ambulatory Visit: Payer: Medicare Other | Admitting: Family Medicine

## 2022-06-23 ENCOUNTER — Ambulatory Visit: Payer: Medicare Other | Admitting: Family Medicine

## 2022-06-30 ENCOUNTER — Other Ambulatory Visit: Payer: Self-pay

## 2022-06-30 ENCOUNTER — Telehealth: Payer: Self-pay | Admitting: Family Medicine

## 2022-06-30 DIAGNOSIS — E669 Obesity, unspecified: Secondary | ICD-10-CM

## 2022-06-30 NOTE — Telephone Encounter (Signed)
Patient called to schedule with the nurse practitioner and will see Dr.Blyth in September.

## 2022-06-30 NOTE — Telephone Encounter (Signed)
Called pt lvm she will need to be seen by Dr.Blyth, its been a year since she has been seen. I sent referral to nurse case manager for help as well.

## 2022-06-30 NOTE — Telephone Encounter (Signed)
Patient called and would like to know if provider can put in an order for house calls. She said it is very to difficult to clean, bathe and feed herself.

## 2022-07-07 ENCOUNTER — Encounter: Payer: Self-pay | Admitting: Family Medicine

## 2022-07-07 ENCOUNTER — Ambulatory Visit (INDEPENDENT_AMBULATORY_CARE_PROVIDER_SITE_OTHER): Payer: Medicare Other | Admitting: Family Medicine

## 2022-07-07 VITALS — BP 136/79 | HR 73 | Ht 69.0 in | Wt 225.0 lb

## 2022-07-07 DIAGNOSIS — M545 Low back pain, unspecified: Secondary | ICD-10-CM

## 2022-07-07 DIAGNOSIS — M25552 Pain in left hip: Secondary | ICD-10-CM

## 2022-07-07 DIAGNOSIS — M25561 Pain in right knee: Secondary | ICD-10-CM

## 2022-07-07 DIAGNOSIS — M25562 Pain in left knee: Secondary | ICD-10-CM

## 2022-07-07 DIAGNOSIS — J441 Chronic obstructive pulmonary disease with (acute) exacerbation: Secondary | ICD-10-CM

## 2022-07-07 DIAGNOSIS — M25551 Pain in right hip: Secondary | ICD-10-CM | POA: Diagnosis not present

## 2022-07-07 DIAGNOSIS — G8929 Other chronic pain: Secondary | ICD-10-CM

## 2022-07-07 MED ORDER — SYMBICORT 80-4.5 MCG/ACT IN AERO: 2.0000 | INHALATION_SPRAY | Freq: Two times a day (BID) | RESPIRATORY_TRACT | 5 refills | Status: DC

## 2022-07-07 NOTE — Progress Notes (Signed)
Acute Office Visit  Subjective:     Patient ID: Terri Mckee, female    DOB: 1940/02/03, 82 y.o.   MRN: [id number]  Chief Complaint  Patient presents with   Medical Management of Chronic Issues    HPI Patient is in today for home health request.   Discussed the use of AI scribe software for clinical note transcription with the patient, who gave verbal consent to proceed.  History of Present Illness   The patient, with a history of chronic leg pain and mobility issues, requests a home health aide due to difficulty performing daily activities. She lives with her sick husband and a sixteen-year-old handicapped granddaughter. She reports difficulty with bathing, cooking, and cleaning, and performs most tasks from a wheelchair. She uses a walker for mobility and has a wheelchair in the home.   The patient also reports chronic leg pain, which is worse at night and prevents sleep. She receives cortisone shots every three months for hip pain and has been told she may need hip surgery. 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 She has an upcoming appointment with sports medicine for further evaluation.  Requesting refill on Symbicort, but she doesn't think insurance will cover it anymore, unsure of covered alternatives. No current respiratory symptoms.           ROS All review of systems negative except what is listed in the HPI      Objective:    BP 136/79   Pulse 73   Ht 5\' 9"  (1.753 m)   Wt 225 lb (102.1 kg)   LMP  (LMP Unknown)   SpO2 98%   BMI 33.23 kg/m    Physical Exam Vitals reviewed.  Constitutional:      Appearance: Normal appearance.  Cardiovascular:     Rate and Rhythm: Normal rate and regular rhythm.     Pulses: Normal pulses.     Heart sounds: Normal heart sounds.  Pulmonary:     Effort: Pulmonary effort is normal.     Breath sounds: Normal breath sounds.  Musculoskeletal:     Comments: Using wheelchair  Skin:    General: Skin is warm and  dry.  Neurological:     Mental Status: She is alert and oriented to person, place, and time.  Psychiatric:        Mood and Affect: Mood normal.        Behavior: Behavior normal.        Thought Content: Thought content normal.        Judgment: Judgment normal.     No results found for any visits on 07/07/22.      Assessment & Plan:   Problem List Items Addressed This Visit     Pain in both knees - Primary   Relevant Orders   Ambulatory referral to Home Health   Low back pain   Relevant Orders   Ambulatory referral to Home Health   Bilateral hip pain   Relevant Orders   Ambulatory referral to Home Health   Other Visit Diagnoses     COPD exacerbation (HCC)       Relevant Medications   SYMBICORT 80-4.5 MCG/ACT inhaler      Home Health orders placed - they will be calling you to get this set up.  Continue following with sports medicine for your chronic pain conditions Refilling your Symbicort - if not covered, hopefully they will send [place] a letter with alternatives on your plan.  Keep all  upcoming appointments.    Meds ordered this encounter  Medications   SYMBICORT 80-4.5 MCG/ACT inhaler    Sig: Inhale 2 puffs into the lungs in the morning and at bedtime.    Dispense:  10.2 g    Refill:  5    Order Specific Question:   Supervising Provider    Answer:   Danise Edge A [[id number]]    Return if symptoms worsen or fail to improve.  Clayborne Dana, NP

## 2022-07-07 NOTE — Patient Instructions (Addendum)
Home Health orders placed - they will be calling you to get this set up.  Continue following with sports medicine for your chronic pain conditions Refilling your Symbicort - if not covered, hopefully they will send [place] a letter with alternatives on your plan.  Keep all upcoming appointments.

## 2022-07-09 ENCOUNTER — Telehealth: Payer: Self-pay | Admitting: Family Medicine

## 2022-07-09 DIAGNOSIS — J441 Chronic obstructive pulmonary disease with (acute) exacerbation: Secondary | ICD-10-CM

## 2022-07-09 MED ORDER — MOMETASONE FURO-FORMOTEROL FUM 100-5 MCG/ACT IN AERO: 2.0000 | INHALATION_SPRAY | Freq: Two times a day (BID) | RESPIRATORY_TRACT | 5 refills | Status: DC

## 2022-07-09 NOTE — Telephone Encounter (Signed)
Called spoke with Mardella Layman verbal order was given for OT/PT and Skilled Nursing Caller/Agency: [facility]  Callback Number: [phone] (confidential vm) Requesting OT/PT/Skilled Nursing/Social Work/Speech Therapy: Skilled Nursing effective 07/09/22 & eval with Medical Social worker  Frequency: Nursing 1x4 and 2 PRN's.

## 2022-07-09 NOTE — Addendum Note (Signed)
Addended by: Hyman Hopes B on: 07/09/2022 04:30 PM   Modules accepted: Orders

## 2022-07-09 NOTE — Telephone Encounter (Signed)
Caller/Agency: [facility]  Callback Number: [phone] (confidential vm) Requesting OT/PT/Skilled Nursing/Social Work/Speech Therapy: Skilled Nursing effective 07/09/22 & eval with Medical Social worker  Frequency: Nursing 1x4 and 2 PRN's.    Also pt's insurance will not cover symbicort and it's too expensive so pt has not been taken it. Pt would like an alternative to be sent it to pharmacy.

## 2022-07-09 NOTE — Telephone Encounter (Signed)
Patient said she was going to call her insurance to see what alternative inhaler was covered. I will try for [facility] unless she calls back and says something different is preferred.

## 2022-07-14 ENCOUNTER — Telehealth: Payer: Self-pay

## 2022-07-14 NOTE — Progress Notes (Signed)
  Chronic Care Management   Note  07/14/2022 Name: Terri Mckee MRN: [id number] DOB: 08-10-1940  Terri Mckee Terri Mckee is a 82 y.o. year old female who is a primary care patient of Bradd Canary, MD. I reached out to [name] by phone today in response to a referral sent by [name] PCP.  [name] was given information about Chronic Care Management services today including:  CCM service includes personalized support from designated clinical staff supervised by the physician, including individualized plan of care and coordination with other care providers 24/7 contact phone numbers for assistance for urgent and routine care needs. Service will only be billed when office clinical staff spend 20 minutes or more in a month to coordinate care. Only one practitioner may furnish and bill the service in a calendar month. The patient may stop CCM services at amy time (effective at the end of the month) by phone call to the office staff. The patient will be responsible for cost sharing (co-pay) or up to 20% of the service fee (after annual deductible is met)  Ms. [name]  agreedto scheduling an appointment with the CCM RN Case Manager   Follow up plan: Patient agreed to scheduled appointment with RN Case Manager on 07/20/2022(date/time).   Penne Lash, RMA Care Guide [facility]  [place], [place] [address] Direct Dial: [phone] [email or web address]

## 2022-07-14 NOTE — Progress Notes (Signed)
Error

## 2022-07-20 ENCOUNTER — Ambulatory Visit: Payer: Medicare Other

## 2022-07-20 ENCOUNTER — Telehealth: Payer: Self-pay

## 2022-07-21 NOTE — Chronic Care Management (AMB) (Signed)
Error. Please disregard

## 2022-07-28 ENCOUNTER — Other Ambulatory Visit: Payer: Self-pay | Admitting: Family Medicine

## 2022-07-28 DIAGNOSIS — I10 Essential (primary) hypertension: Secondary | ICD-10-CM

## 2022-07-29 NOTE — Progress Notes (Signed)
Rubin Payor, PhD, LAT, ATC acting as a scribe for Clementeen Graham, MD.  Terri Mckee is a 82 y.o. female who presents to [facility] at [facility] today for 42-month f/u bilat hip pain. Pt was last seen by Dr. Denyse Amass on 04/29/22 and was given a R GT steroid injection and advised to cont HEP. Last R intra-articular hip injection was on 07/25/21.  Today, pt reports R hip pain has started to return over the last couple weeks. She also c/o an "aching" pain along the lateral aspect of her R lower leg. She locates her R hip to the lateral aspect. She cont working with home health PT, last visit was yesterday. The pain radiating down the right lower leg is worse at bedtime.  Dx imaging: 04/14/21 Bilat hips XR             06/04/20 L-spine MRI             04/17/20 Bilat hips XR  Pertinent review of systems: No fevers or chills  Relevant historical information: Hypertension.  Obesity.   Exam:  BP 138/84   Pulse (!) 53   Ht 5\' 9"  (1.753 m)   Wt 224 lb (101.6 kg)   LMP  (LMP Unknown)   SpO2 98%   BMI 33.08 kg/m  General: Well Developed, well nourished, and in no acute distress.   MSK: L-spine: Normal appearing Nontender palpation midline. Decreased lumbar motion. Lower extremity strength is mildly decreased hip abduction.  Foot dorsiflexion and other lower extremity strength is intact. Reflexes are intact.  Right hip: Decreased range of motion.  Normal appearing. Tender palpation greater trochanter    Lab and Radiology Results  Procedure: Real-time Ultrasound Guided Injection of right lateral hip greater trochanter bursa Device: Philips Affiniti 50G/GE Logiq Images permanently stored and available for review in PACS Verbal informed consent obtained.  Discussed risks and benefits of procedure. Warned about infection, bleeding, hyperglycemia damage to structures among others. Patient expresses understanding and agreement Time-out conducted.   Noted no  overlying erythema, induration, or other signs of local infection.   Skin prepped in a sterile fashion.   Local anesthesia: Topical Ethyl chloride.   With sterile technique and under real time ultrasound guidance: 40 mg of Kenalog and 2 mL of Marcaine injected into greater trochanter bursa. Fluid seen entering the bursa.   Completed without difficulty   Pain immediately resolved suggesting accurate placement of the medication.   Advised to call if fevers/chills, erythema, induration, drainage, or persistent bleeding.   Images permanently stored and available for review in the ultrasound unit.  Impression: Technically successful ultrasound guided injection.   MRI lumbar spine [facility] 2022:       Assessment and Plan: 82 y.o. female with right lateral hip pain thought to be trochanteric bursitis.  Steroid injection performed today.  It has been about 3 months since her last injection.  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  Her symptoms are mostly bothersome at bedtime.  Will add gabapentin to try at bedtime and if not better consider epidural steroid injection.  Additionally she notes some cramping.  Will go ahead and check electrolytes.  These were last checked about a year ago and were okay.  She has a visit scheduled with her PCP for what I think is a wellness visit in  about 2 months.  Back in 3 months.   PDMP not reviewed this encounter. Orders Placed This Encounter  Procedures   [place] LIMITED JOINT SPACE STRUCTURES LOW RIGHT(NO LINKED CHARGES)    Order Specific Question:   Reason for Exam (SYMPTOM  OR DIAGNOSIS REQUIRED)    Answer:   right hip pain    Order Specific Question:   Preferred imaging location?    Answer:   [facility]   Comprehensive metabolic panel    Standing Status:   Future     Standing Expiration Date:   07/30/2023   Magnesium   Meds ordered this encounter  Medications   gabapentin (NEURONTIN) 100 MG capsule    Sig: Take 1-3 capsules (100-300 mg total) by mouth at bedtime as needed (nerve pain).    Dispense:  60 capsule    Refill:  3     Discussed warning signs or symptoms. Please see discharge instructions. Patient expresses understanding.   The above documentation has been reviewed and is accurate and complete Clementeen Graham, M.D.

## 2022-07-30 ENCOUNTER — Ambulatory Visit: Payer: Medicare Other | Admitting: Family Medicine

## 2022-07-30 ENCOUNTER — Other Ambulatory Visit: Payer: Self-pay

## 2022-07-30 VITALS — BP 138/84 | HR 53 | Ht 69.0 in | Wt 224.0 lb

## 2022-07-30 DIAGNOSIS — M25551 Pain in right hip: Secondary | ICD-10-CM

## 2022-07-30 DIAGNOSIS — M5416 Radiculopathy, lumbar region: Secondary | ICD-10-CM

## 2022-07-30 DIAGNOSIS — R252 Cramp and spasm: Secondary | ICD-10-CM | POA: Diagnosis not present

## 2022-07-30 LAB — COMPREHENSIVE METABOLIC PANEL
ALT: 11 U/L (ref 0–35)
AST: 13 U/L (ref 0–37)
Albumin: 3.8 g/dL (ref 3.5–5.2)
Alkaline Phosphatase: 54 U/L (ref 39–117)
BUN: 19 mg/dL (ref 6–23)
CO2: 31 mEq/L (ref 19–32)
Calcium: 9.6 mg/dL (ref 8.4–10.5)
Chloride: 106 mEq/L (ref 96–112)
Creatinine, Ser: 1.39 mg/dL — ABNORMAL HIGH (ref 0.40–1.20)
GFR: 35.35 mL/min — ABNORMAL LOW (ref >60.00)
Glucose, Bld: 94 mg/dL (ref 70–99)
Potassium: 4.1 mEq/L (ref 3.5–5.1)
Sodium: 144 mEq/L (ref 135–145)
Total Bilirubin: 0.5 mg/dL (ref 0.2–1.2)
Total Protein: 7 g/dL (ref 6.0–8.3)

## 2022-07-30 LAB — MAGNESIUM: Magnesium: 1.9 mg/dL (ref 1.5–2.5)

## 2022-07-30 MED ORDER — GABAPENTIN 100 MG PO CAPS: 100.0000 mg | ORAL_CAPSULE | Freq: Every evening | ORAL | 3 refills | Status: DC | PRN

## 2022-07-30 NOTE — Patient Instructions (Addendum)
Thank you for coming in today.   You received an injection today. Seek immediate medical attention if the joint becomes red, extremely painful, or is oozing fluid.   Please get labs today before you leave   Check back in 3 months

## 2022-08-01 ENCOUNTER — Other Ambulatory Visit: Payer: Self-pay | Admitting: Family Medicine

## 2022-08-03 NOTE — Progress Notes (Signed)
Electrolytes look okay.  No elevated potassium and low magnesium.  Your kidney function is very slowly worsening.

## 2022-08-26 ENCOUNTER — Other Ambulatory Visit: Payer: Self-pay | Admitting: Family Medicine

## 2022-08-26 DIAGNOSIS — Z1231 Encounter for screening mammogram for malignant neoplasm of breast: Secondary | ICD-10-CM

## 2022-09-02 ENCOUNTER — Ambulatory Visit (INDEPENDENT_AMBULATORY_CARE_PROVIDER_SITE_OTHER): Payer: Medicare Other | Admitting: Orthopaedic Surgery

## 2022-09-02 VITALS — Ht 69.0 in | Wt 232.0 lb

## 2022-09-02 DIAGNOSIS — M25551 Pain in right hip: Secondary | ICD-10-CM | POA: Diagnosis not present

## 2022-09-02 DIAGNOSIS — M1611 Unilateral primary osteoarthritis, right hip: Secondary | ICD-10-CM | POA: Diagnosis not present

## 2022-09-02 NOTE — Progress Notes (Signed)
The patient is an 82 year old female I am seeing for the first time today.  This is to discuss hip replacement surgery.  She is seen regularly by Dr. Abner Greenspan and actually had a steroid injection about 3 weeks ago in her right hip.  She does report daily right hip pain and said this time the steroid injection has not helped.  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  She says her left hip does not really bother her.  She says both her knees ache at night and her legs hurt.  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  I was able to review all of her medications and past medical history within epic.  Family is with her today as well.  She has a hard time getting around given the arthritis she has especially in her right hip.  She says there is groin pain and hurts all around her right hip.  At this point it is detrimentally affecting her mobility, her quality of life and her actives daily living.  She is not diabetic.  Her BMI is 34.26.  On exam her right hip has stiffness on rotation and pain in the groin with internal and external rotation.  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  She has loss of joint space and sclerotic changes as well as osteophytes.  I did give her handout about hip replacement surgery.  We talked about this in length in detail.  She does have arthritic changes in her knees and her other hip but it seems like her right hip is what is bothering her the most.  I showed her hip replacement model and discussed what this type of surgery involves.  I discussed the risk and benefits of surgery and what to expect from an intraoperative and postoperative standpoint.  All question concerns were answered and addressed.  Will work on getting this scheduled at some point.

## 2022-09-16 ENCOUNTER — Ambulatory Visit
Admission: RE | Admit: 2022-09-16 | Discharge: 2022-09-16 | Disposition: A | Discharge status: Home / Self Care | Payer: Medicare Other | Source: Ambulatory Visit | Attending: Family Medicine | Admitting: Family Medicine

## 2022-09-16 DIAGNOSIS — Z1231 Encounter for screening mammogram for malignant neoplasm of breast: Secondary | ICD-10-CM

## 2022-09-18 ENCOUNTER — Other Ambulatory Visit: Payer: Self-pay | Admitting: Family Medicine

## 2022-09-18 DIAGNOSIS — R928 Other abnormal and inconclusive findings on diagnostic imaging of breast: Secondary | ICD-10-CM

## 2022-09-24 ENCOUNTER — Other Ambulatory Visit: Payer: Medicare Other

## 2022-09-24 ENCOUNTER — Other Ambulatory Visit (HOSPITAL_BASED_OUTPATIENT_CLINIC_OR_DEPARTMENT_OTHER): Payer: Self-pay

## 2022-09-24 ENCOUNTER — Encounter: Payer: Self-pay | Admitting: Family Medicine

## 2022-09-24 ENCOUNTER — Ambulatory Visit: Payer: Medicare Other | Admitting: Family Medicine

## 2022-09-24 VITALS — BP 128/74 | HR 68 | Temp 98.0°F | Resp 16 | Ht 69.0 in | Wt 209.8 lb

## 2022-09-24 DIAGNOSIS — E559 Vitamin D deficiency, unspecified: Secondary | ICD-10-CM | POA: Diagnosis not present

## 2022-09-24 DIAGNOSIS — M25551 Pain in right hip: Secondary | ICD-10-CM

## 2022-09-24 DIAGNOSIS — I10 Essential (primary) hypertension: Secondary | ICD-10-CM

## 2022-09-24 DIAGNOSIS — J441 Chronic obstructive pulmonary disease with (acute) exacerbation: Secondary | ICD-10-CM

## 2022-09-24 DIAGNOSIS — R252 Cramp and spasm: Secondary | ICD-10-CM

## 2022-09-24 DIAGNOSIS — R7982 Elevated C-reactive protein (CRP): Secondary | ICD-10-CM

## 2022-09-24 DIAGNOSIS — M8580 Other specified disorders of bone density and structure, unspecified site: Secondary | ICD-10-CM

## 2022-09-24 DIAGNOSIS — E782 Mixed hyperlipidemia: Secondary | ICD-10-CM | POA: Diagnosis not present

## 2022-09-24 DIAGNOSIS — Z6831 Body mass index (BMI) 31.0-31.9, adult: Secondary | ICD-10-CM | POA: Diagnosis not present

## 2022-09-24 DIAGNOSIS — E669 Obesity, unspecified: Secondary | ICD-10-CM

## 2022-09-24 DIAGNOSIS — E88819 Insulin resistance, unspecified: Secondary | ICD-10-CM

## 2022-09-24 DIAGNOSIS — N6320 Unspecified lump in the left breast, unspecified quadrant: Secondary | ICD-10-CM

## 2022-09-24 LAB — CBC WITH DIFFERENTIAL/PLATELET
Basophils Absolute: 0 10*3/uL (ref 0.0–0.1)
Basophils Relative: 0.5 % (ref 0.0–3.0)
Eosinophils Absolute: 0.2 10*3/uL (ref 0.0–0.7)
Eosinophils Relative: 2.4 % (ref 0.0–5.0)
HCT: 41.6 % (ref 36.0–46.0)
Hemoglobin: 13.3 g/dL (ref 12.0–15.0)
Lymphocytes Relative: 21.6 % (ref 12.0–46.0)
Lymphs Abs: 1.4 10*3/uL (ref 0.7–4.0)
MCHC: 32 g/dL (ref 30.0–36.0)
MCV: 89.7 fl (ref 78.0–100.0)
Monocytes Absolute: 0.6 10*3/uL (ref 0.1–1.0)
Monocytes Relative: 9.4 % (ref 3.0–12.0)
Neutro Abs: 4.3 10*3/uL (ref 1.4–7.7)
Neutrophils Relative %: 66.1 % (ref 43.0–77.0)
Platelets: 219 10*3/uL (ref 150.0–400.0)
RBC: 4.64 Mil/uL (ref 3.87–5.11)
RDW: 14 % (ref 11.5–15.5)
WBC: 6.5 10*3/uL (ref 4.0–10.5)

## 2022-09-24 LAB — LIPID PANEL
Cholesterol: 197 mg/dL (ref 0–200)
HDL: 53.8 mg/dL (ref >39.00)
LDL Cholesterol: 125 mg/dL — ABNORMAL HIGH (ref 0–99)
NonHDL: 143.23
Total CHOL/HDL Ratio: 4
Triglycerides: 93 mg/dL (ref 0.0–149.0)
VLDL: 18.6 mg/dL (ref 0.0–40.0)

## 2022-09-24 LAB — COMPREHENSIVE METABOLIC PANEL
ALT: 12 U/L (ref 0–35)
AST: 13 U/L (ref 0–37)
Albumin: 3.8 g/dL (ref 3.5–5.2)
Alkaline Phosphatase: 61 U/L (ref 39–117)
BUN: 15 mg/dL (ref 6–23)
CO2: 30 meq/L (ref 19–32)
Calcium: 9.3 mg/dL (ref 8.4–10.5)
Chloride: 101 meq/L (ref 96–112)
Creatinine, Ser: 1.16 mg/dL (ref 0.40–1.20)
GFR: 43.87 mL/min — ABNORMAL LOW (ref >60.00)
Glucose, Bld: 84 mg/dL (ref 70–99)
Potassium: 4 meq/L (ref 3.5–5.1)
Sodium: 140 meq/L (ref 135–145)
Total Bilirubin: 0.5 mg/dL (ref 0.2–1.2)
Total Protein: 7.1 g/dL (ref 6.0–8.3)

## 2022-09-24 LAB — HIGH SENSITIVITY CRP: CRP, High Sensitivity: 49.47 mg/L — ABNORMAL HIGH (ref 0.000–5.000)

## 2022-09-24 LAB — HEMOGLOBIN A1C: Hgb A1c MFr Bld: 5.2 % (ref 4.6–6.5)

## 2022-09-24 LAB — TSH: TSH: 2.77 u[IU]/mL (ref 0.35–5.50)

## 2022-09-24 LAB — MAGNESIUM: Magnesium: 2.1 mg/dL (ref 1.5–2.5)

## 2022-09-24 LAB — VITAMIN D 25 HYDROXY (VIT D DEFICIENCY, FRACTURES): VITD: 43.72 ng/mL (ref 30.00–100.00)

## 2022-09-24 MED ORDER — COVID-19 MRNA VAC-TRIS(PFIZER) 30 MCG/0.3ML IM SUSY
0.3000 mL | PREFILLED_SYRINGE | Freq: Once | INTRAMUSCULAR | 0 refills | Status: AC
Start: 2022-09-24 — End: 2022-09-25
  Filled 2022-09-24: qty 0.3, 1d supply, fill #0

## 2022-09-24 MED ORDER — MOMETASONE FURO-FORMOTEROL FUM 50-5 MCG/ACT IN AERO: 2.0000 | INHALATION_SPRAY | Freq: Two times a day (BID) | RESPIRATORY_TRACT | 5 refills | Status: DC

## 2022-09-24 MED ORDER — ALBUTEROL SULFATE HFA 108 (90 BASE) MCG/ACT IN AERS
2.0000 | INHALATION_SPRAY | Freq: Four times a day (QID) | RESPIRATORY_TRACT | 5 refills | Status: DC | PRN
Start: 2022-09-24 — End: 2023-09-09

## 2022-09-24 NOTE — Assessment & Plan Note (Signed)
Well controlled, no changes to meds. Encouraged heart healthy diet such as the DASH diet and exercise as tolerated.  °

## 2022-09-24 NOTE — Assessment & Plan Note (Signed)
Encouraged DASH or MIND diet, decrease po intake and increase exercise as tolerated. Needs 7-8 hours of sleep nightly. Avoid trans fats, eat small, frequent meals every 4-5 hours with lean proteins, complex carbs and healthy fats. Minimize simple carbs, high fat foods and processed foods 

## 2022-09-24 NOTE — Assessment & Plan Note (Signed)
Monitor and check sed rate as well. Minimize simple carbs and processed foods, stay as active as able

## 2022-09-24 NOTE — Assessment & Plan Note (Signed)
Encouraged to get adequate exercise, calcium and vitamin d intake 

## 2022-09-24 NOTE — Assessment & Plan Note (Signed)
Encourage heart healthy diet such as MIND or DASH diet, increase exercise, avoid trans fats, simple carbohydrates and processed foods, consider a krill or fish or flaxseed oil cap daily.  °

## 2022-09-24 NOTE — Assessment & Plan Note (Signed)
Hydrate and monitor 

## 2022-09-24 NOTE — Assessment & Plan Note (Signed)
Right hip is scheduled for replacement on 10/8 with Dr Magnus Ivan

## 2022-09-24 NOTE — Assessment & Plan Note (Signed)
Supplement and monitor 

## 2022-09-24 NOTE — Patient Instructions (Signed)
Hypertension, Adult High blood pressure (hypertension) is when the force of blood pumping through the arteries is too strong. The arteries are the blood vessels that carry blood from the heart throughout the body. Hypertension forces the heart to work harder to pump blood and may cause arteries to become narrow or stiff. Untreated or uncontrolled hypertension can lead to a heart attack, heart failure, a stroke, kidney disease, and other problems. A blood pressure reading consists of a higher number over a lower number. Ideally, your blood pressure should be below 120/80. The first ("top") number is called the systolic pressure. It is a measure of the pressure in your arteries as your heart beats. The second ("bottom") number is called the diastolic pressure. It is a measure of the pressure in your arteries as the heart relaxes. What are the causes? The exact cause of this condition is not known. There are some conditions that result in high blood pressure. What increases the risk? Certain factors may make you more likely to develop high blood pressure. Some of these risk factors are under your control, including: Smoking. Not getting enough exercise or physical activity. Being overweight. Having too much fat, sugar, calories, or salt (sodium) in your diet. Drinking too much alcohol. Other risk factors include: Having a personal history of heart disease, diabetes, high cholesterol, or kidney disease. Stress. Having a family history of high blood pressure and high cholesterol. Having obstructive sleep apnea. Age. The risk increases with age. What are the signs or symptoms? High blood pressure may not cause symptoms. Very high blood pressure (hypertensive crisis) may cause: Headache. Fast or irregular heartbeats (palpitations). Shortness of breath. Nosebleed. Nausea and vomiting. Vision changes. Severe chest pain, dizziness, and seizures. How is this diagnosed? This condition is diagnosed by  measuring your blood pressure while you are seated, with your arm resting on a flat surface, your legs uncrossed, and your feet flat on the floor. The cuff of the blood pressure monitor will be placed directly against the skin of your upper arm at the level of your heart. Blood pressure should be measured at least twice using the same arm. Certain conditions can cause a difference in blood pressure between your right and left arms. If you have a high blood pressure reading during one visit or you have normal blood pressure with other risk factors, you may be asked to: Return on a different day to have your blood pressure checked again. Monitor your blood pressure at home for 1 week or longer. If you are diagnosed with hypertension, you may have other blood or imaging tests to help your health care provider understand your overall risk for other conditions. How is this treated? This condition is treated by making healthy lifestyle changes, such as eating healthy foods, exercising more, and reducing your alcohol intake. You may be referred for counseling on a healthy diet and physical activity. Your health care provider may prescribe medicine if lifestyle changes are not enough to get your blood pressure under control and if: Your systolic blood pressure is above 130. Your diastolic blood pressure is above 80. Your personal target blood pressure may vary depending on your medical conditions, your age, and other factors. Follow these instructions at home: Eating and drinking  Eat a diet that is high in fiber and potassium, and low in sodium, added sugar, and fat. An example of this eating plan is called the DASH diet. DASH stands for Dietary Approaches to Stop Hypertension. To eat this way: Eat   plenty of fresh fruits and vegetables. Try to fill one half of your plate at each meal with fruits and vegetables. Eat whole grains, such as whole-wheat pasta, brown rice, or whole-grain bread. Fill about one  fourth of your plate with whole grains. Eat or drink low-fat dairy products, such as skim milk or low-fat yogurt. Avoid fatty cuts of meat, processed or cured meats, and poultry with skin. Fill about one fourth of your plate with lean proteins, such as fish, chicken without skin, beans, eggs, or tofu. Avoid pre-made and processed foods. These tend to be higher in sodium, added sugar, and fat. Reduce your daily sodium intake. Many people with hypertension should eat less than 1,500 mg of sodium a day. Do not drink alcohol if: Your health care provider tells you not to drink. You are pregnant, may be pregnant, or are planning to become pregnant. If you drink alcohol: Limit how much you have to: 0-1 drink a day for women. 0-2 drinks a day for men. Know how much alcohol is in your drink. In the [place] one drink equals one 12 oz bottle of beer (355 mL), one 5 oz glass of wine (148 mL), or one 1 oz glass of hard liquor (44 mL). Lifestyle  Work with your health care provider to maintain a healthy body weight or to lose weight. Ask what an ideal weight is for you. Get at least 30 minutes of exercise that causes your heart to beat faster (aerobic exercise) most days of the week. Activities may include walking, swimming, or biking. Include exercise to strengthen your muscles (resistance exercise), such as Pilates or lifting weights, as part of your weekly exercise routine. Try to do these types of exercises for 30 minutes at least 3 days a week. Do not use any products that contain nicotine or tobacco. These products include cigarettes, chewing tobacco, and vaping devices, such as e-cigarettes. If you need help quitting, ask your health care provider. Monitor your blood pressure at home as told by your health care provider. Keep all follow-up visits. This is important. Medicines Take over-the-counter and prescription medicines only as told by your health care provider. Follow directions carefully. Blood  pressure medicines must be taken as prescribed. Do not skip doses of blood pressure medicine. Doing this puts you at risk for problems and can make the medicine less effective. Ask your health care provider about side effects or reactions to medicines that you should watch for. Contact a health care provider if you: Think you are having a reaction to a medicine you are taking. Have headaches that keep coming back (recurring). Feel dizzy. Have swelling in your ankles. Have trouble with your vision. Get help right away if you: Develop a severe headache or confusion. Have unusual weakness or numbness. Feel faint. Have severe pain in your chest or abdomen. Vomit repeatedly. Have trouble breathing. These symptoms may be an emergency. Get help right away. Call 911. Do not wait to see if the symptoms will go away. Do not drive yourself to the hospital. Summary Hypertension is when the force of blood pumping through your arteries is too strong. If this condition is not controlled, it may put you at risk for serious complications. Your personal target blood pressure may vary depending on your medical conditions, your age, and other factors. For most people, a normal blood pressure is less than 120/80. Hypertension is treated with lifestyle changes, medicines, or a combination of both. Lifestyle changes include losing weight, eating a healthy,   low-sodium diet, exercising more, and limiting alcohol. This information is not intended to replace advice given to you by your health care provider. Make sure you discuss any questions you have with your health care provider. Document Revised: 11/05/2020 Document Reviewed: 11/05/2020 Elsevier Patient Education  2024 [employer]  

## 2022-09-25 NOTE — Progress Notes (Signed)
Subjective:    Patient ID: [name], female    DOB: 1940/06/25, 82 y.o.   MRN: [id number]  Chief Complaint  Patient presents with   Follow-up    Follow up    HPI Discussed the use of AI scribe software for clinical note transcription with the patient, who gave verbal consent to proceed.  History of Present Illness   Patient 1, with a history of hip problems, is scheduled for hip surgery. She reports no recent acute fall or injury, but her hip condition has been progressively worsening over time. She also mentions a recent day of illness characterized by general malaise and nausea, but no vomiting, diarrhea, or fever. She could not identify any specific triggers for this episode and reports feeling better at the time of the visit. She also reports a cut on her thumb about six weeks ago, which still causes pain and a popping sensation, but shows no signs of infection. She has not had a flu shot but plans to get another COVID-19 vaccine shot. She also mentions a recent abnormal mammogram result, which indicated a possible mass in the left breast, requiring further evaluation.        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   Cataract    Cataracts, bilateral 07/25/2015   Cervical cancer screening 07/07/2012   Colon polyps    Elevated serum creatinine    with ACE inhibitor (Normal MRA of renal arteries)   GERD (gastroesophageal reflux disease)    History of cyst of breast    Hoarseness of voice 03/13/2012   Hypertension    Lactose intolerance    Medicare annual wellness visit, subsequent 09/02/2014   Muscle spasm of left lower extremity 03/21/2007   Qualifier: Diagnosis of  By: Nena Jordan    Osteoarthritis    Osteopenia 08/17/2014    Preventative health care 04/05/2016   Shortness of breath    Skin lesion 07/10/2012   Urinary incontinence 07/10/2012   Vitamin D deficiency 04/03/2016    Past Surgical History:  Procedure Laterality Date   APPENDECTOMY     BREAST CYST EXCISION Bilateral    right breast, twice   OVARY SURGERY     right ovary & tube replacement   WRIST GANGLION EXCISION     rt    Family History  Problem Relation Age of Onset   Brain cancer Mother        deceased age 91   Alcohol abuse Mother    Obesity Mother    Other Brother        died of sepsis   Gout Brother    Alcohol abuse Father    Cancer Sister        throat   Pulmonary embolism Daughter    Mental illness Daughter        anxiety   Cancer Daughter        uterine?   Diabetes Maternal Aunt    Diabetes Maternal Aunt    Seizures Maternal Aunt    Birth defects Maternal Aunt    Lupus Grandchild    Sickle cell anemia Grandchild    Colon cancer Neg Hx    Breast  cancer Neg Hx     Social History   Socioeconomic History   Marital status: Married    Spouse name: Onalee Hua   Number of children: Not on file   Years of education: Not on file   Highest education level: Not on file  Occupational History   Occupation: retired  Tobacco Use   Smoking status: Former    Current packs/day: 0.00    Types: Cigarettes    Quit date: 01/13/1991    Years since quitting: 31.7   Smokeless tobacco: Never   Tobacco comments:    quit 20 years ago 1/2 ppd x 40 years  Vaping Use   Vaping status: Never Used  Substance and Sexual Activity   Alcohol use: No   Drug use: No   Sexual activity: Never    Comment: lives with husband, no dietary restrictions,   Other Topics Concern   Not on file  Social History Narrative   Former Smoker - quit 20 years ago (1/2 ppd x 40 years)   Widowed   1 Daughter        Social Determinants of Health   Financial Resource Strain: Low Risk  (01/17/2021)   Overall Financial Resource Strain (CARDIA)    Difficulty of  Paying Living Expenses: Not hard at all  Food Insecurity: No Food Insecurity (01/26/2022)   Hunger Vital Sign    Worried About Running Out of Food in the Last Year: Never true    Ran Out of Food in the Last Year: Never true  Transportation Needs: No Transportation Needs (01/26/2022)   PRAPARE - Administrator, Civil Service (Medical): No    Lack of Transportation (Non-Medical): No  Physical Activity: Inactive (01/17/2021)   Exercise Vital Sign    Days of Exercise per Week: 0 days    Minutes of Exercise per Session: 0 min  Stress: No Stress Concern Present (01/17/2021)   [employer] of Occupational Health - Occupational Stress Questionnaire    Feeling of Stress : Not at all  Social Connections: Moderately Integrated (01/17/2021)   Social Connection and Isolation Panel [NHANES]    Frequency of Communication with Friends and Family: More than three times a week    Frequency of Social Gatherings with Friends and Family: More than three times a week    Attends Religious Services: More than 4 times per year    Active Member of [employer] or Organizations: No    Attends Banker Meetings: Never    Marital Status: Married  Catering manager Violence: Not At Risk (01/26/2022)   Humiliation, Afraid, Rape, and Kick questionnaire    Fear of Current or Ex-Partner: No    Emotionally Abused: No    Physically Abused: No    Sexually Abused: No    Outpatient Medications Prior to Visit  Medication Sig Dispense Refill   aspirin 81 MG tablet Take 81 mg by mouth daily.     Biotin w/ Vitamins C & E (HAIR/SKIN/NAILS PO) Take by mouth.     calcium carbonate (OS-CAL) 600 MG TABS Take 600 mg by mouth daily.     Cholecalciferol (VITAMIN D3 PO) Take by mouth.     famotidine (PEPCID) 20 MG tablet TAKE 1 TABLET(20 MG) BY MOUTH TWICE DAILY 180 tablet 0   felodipine (PLENDIL) 10 MG 24 hr tablet TAKE 1 TABLET(10 MG) BY MOUTH DAILY 90 tablet 1   gabapentin (NEURONTIN) 100 MG capsule Take 1-3  capsules (100-300 mg total) by mouth at bedtime as  needed (nerve pain). 60 capsule 3   Garlic 300 MG CAPS Take 1 capsule by mouth daily.     Krill Oil 1000 MG CAPS Take 1,000 capsules by mouth daily.     Multiple Vitamin (MULTIVITAMIN) tablet Take 1 tablet by mouth daily.     Omega-3 Fatty Acids (OMEGA 3 PO) Take by mouth.     XYZAL 5 MG tablet Take 1 tablet (5 mg total) by mouth every evening. 90 tablet 3   albuterol (VENTOLIN HFA) 108 (90 Base) MCG/ACT inhaler Inhale 2 puffs into the lungs every 6 (six) hours as needed for wheezing or shortness of breath. (Patient not taking: Reported on 07/07/2022) 18 g 5   mometasone-formoterol (DULERA) 100-5 MCG/ACT AERO Inhale 2 puffs into the lungs 2 (two) times daily. (Patient not taking: Reported on 07/20/2022) 13 g 5   No facility-administered medications prior to visit.    Allergies  Allergen Reactions   Allegra [Fexofenadine] Shortness Of Breath   Haemophilus Influenzae Vaccines Nausea And Vomiting   Erythromycin Nausea Only   Oxycodone-Acetaminophen Nausea And Vomiting   Propoxyphene N-Acetaminophen Nausea And Vomiting      severe vomiting   Zocor [Simvastatin] Other (See Comments)      Myalgia    ROS     Objective:    Physical Exam  BP 128/74 (BP Location: Left Arm, Patient Position: Sitting, Cuff Size: Normal)   Pulse 68   Temp 98 F (36.7 C) (Oral)   Resp 16   Ht 5\' 9"  (1.753 m)   Wt 209 lb 12.8 oz (95.2 kg)   LMP  (LMP Unknown)   SpO2 99%   BMI 30.98 kg/m  Wt Readings from Last 3 Encounters:  09/24/22 209 lb 12.8 oz (95.2 kg)  09/02/22 232 lb (105.2 kg)  07/30/22 224 lb (101.6 kg)    Diabetic Foot Exam - Simple   No data filed    Lab Results  Component Value Date   WBC 6.5 09/24/2022   HGB 13.3 09/24/2022   HCT 41.6 09/24/2022   PLT 219.0 09/24/2022   GLUCOSE 84 09/24/2022   CHOL 197 09/24/2022   TRIG 93.0 09/24/2022   HDL 53.80 09/24/2022   LDLCALC 125 (H) 09/24/2022   ALT 12 09/24/2022   AST 13  09/24/2022   NA 140 09/24/2022   K 4.0 09/24/2022   CL 101 09/24/2022   CREATININE 1.16 09/24/2022   BUN 15 09/24/2022   CO2 30 09/24/2022   TSH 2.77 09/24/2022   HGBA1C 5.2 09/24/2022    Lab Results  Component Value Date   TSH 2.77 09/24/2022   Lab Results  Component Value Date   WBC 6.5 09/24/2022   HGB 13.3 09/24/2022   HCT 41.6 09/24/2022   MCV 89.7 09/24/2022   PLT 219.0 09/24/2022   Lab Results  Component Value Date   NA 140 09/24/2022   K 4.0 09/24/2022   CO2 30 09/24/2022   GLUCOSE 84 09/24/2022   BUN 15 09/24/2022   CREATININE 1.16 09/24/2022   BILITOT 0.5 09/24/2022   ALKPHOS 61 09/24/2022   AST 13 09/24/2022   ALT 12 09/24/2022   PROT 7.1 09/24/2022   ALBUMIN 3.8 09/24/2022   CALCIUM 9.3 09/24/2022   ANIONGAP 7 02/26/2018   EGFR 42.0 01/29/2022   GFR 43.87 (L) 09/24/2022   Lab Results  Component Value Date   CHOL 197 09/24/2022   Lab Results  Component Value Date   HDL 53.80 09/24/2022   Lab Results  Component  Value Date   LDLCALC 125 (H) 09/24/2022   Lab Results  Component Value Date   TRIG 93.0 09/24/2022   Lab Results  Component Value Date   CHOLHDL 4 09/24/2022   Lab Results  Component Value Date   HGBA1C 5.2 09/24/2022       Assessment & Plan:  Class 1 obesity with serious comorbidity and body mass index (BMI) of 31.0 to 31.9 in adult, unspecified obesity type Assessment & Plan: Encouraged DASH or MIND diet, decrease po intake and increase exercise as tolerated. Needs 7-8 hours of sleep nightly. Avoid trans fats, eat small, frequent meals every 4-5 hours with lean proteins, complex carbs and healthy fats. Minimize simple carbs, high fat foods and processed foods   CRP elevated Assessment & Plan: Monitor and check sed rate as well. Minimize simple carbs and processed foods, stay as active as able  Orders: -     High sensitivity CRP  Essential hypertension Assessment & Plan: Well controlled, no changes to meds.  Encouraged heart healthy diet such as the DASH diet and exercise as tolerated.   Orders: -     CBC with Differential/Platelet -     Comprehensive metabolic panel -     TSH  Hyperlipidemia, mixed Assessment & Plan: Encourage heart healthy diet such as MIND or DASH diet, increase exercise, avoid trans fats, simple carbohydrates and processed foods, consider a krill or fish or flaxseed oil cap daily.   Orders: -     Lipid panel  Muscle cramps Assessment & Plan: Hydrate and monitor   Orders: -     Magnesium  Osteopenia, unspecified location Assessment & Plan: Encouraged to get adequate exercise, calcium and vitamin d intake   Vitamin D deficiency Assessment & Plan: Supplement and monitor  Orders: -     VITAMIN D 25 Hydroxy (Vit-D Deficiency, Fractures)  Bilateral hip pain Assessment & Plan: Right hip is scheduled for replacement on 10/8 with Dr Magnus Ivan   COPD exacerbation (HCC) -     Albuterol Sulfate HFA; Inhale 2 puffs into the lungs every 6 (six) hours as needed for wheezing or shortness of breath.  Dispense: 18 g; Refill: 5  Mass of left breast, unspecified quadrant  Insulin resistance -     Hemoglobin A1c  Other orders -     Mometasone Furo-Formoterol Fum; Inhale 2 puffs into the lungs 2 (two) times daily.  Dispense: 1 each; Refill: 5    Assessment and Plan    Hip Osteoarthritis Progressive worsening of hip pain, no recent acute injury. Upcoming hip surgery scheduled. -Complete preoperative blood work to assess for any underlying infection or other abnormalities.  Thumb Injury History of a cut to the thumb approximately six weeks ago, now with persistent pain and a sensation of "popping". No signs of active infection on examination, likely scar tissue and possible nerve damage. -Continue to monitor for signs of infection (redness, swelling, heat).  Abnormal Mammogram Recent mammogram showed possible mass in the left breast, requiring further  evaluation. -Order diagnostic mammogram and ultrasound of the left breast.  Asthma/COPD Difficulty obtaining Symbicort due to insurance issues. Transitioning to mometasone. -Order mometasone inhaler, two puffs twice daily. Rinse mouth after use to prevent oral steroid exposure. -Order albuterol rescue inhaler for use as needed.  General Health Maintenance / Followup Plans -Encouraged hydration and balanced diet in preparation for upcoming surgery. -Recommended COVID-19 booster shot 2-3 weeks prior to surgery. -Check blood glucose and hemoglobin A1c due to previous insulin resistance. -Follow-up  appointment in four months.         Danise Edge, MD

## 2022-09-28 ENCOUNTER — Other Ambulatory Visit: Payer: Self-pay

## 2022-09-28 MED ORDER — LANCET DEVICE MISC
1.0000 | Freq: Three times a day (TID) | 0 refills | Status: AC
Start: 2022-09-28 — End: 2022-10-28

## 2022-09-28 MED ORDER — LANCETS MISC. MISC
1.0000 | Freq: Three times a day (TID) | 0 refills | Status: AC
Start: 2022-09-28 — End: 2022-10-28

## 2022-09-28 MED ORDER — BLOOD GLUCOSE MONITORING SUPPL DEVI: 1.0000 | Freq: Three times a day (TID) | 0 refills | Status: AC

## 2022-09-28 MED ORDER — BLOOD GLUCOSE TEST VI STRP
1.0000 | ORAL_STRIP | Freq: Three times a day (TID) | 0 refills | Status: AC
Start: 2022-09-28 — End: 2022-10-28

## 2022-10-06 ENCOUNTER — Other Ambulatory Visit: Payer: Self-pay

## 2022-10-07 NOTE — Progress Notes (Signed)
Surgical Instructions   Your procedure is scheduled on Tuesday October 20, 2022. Report to [facility] Main Entrance "A" at 7:40 A.M., then check in with the Admitting office. Any questions or running late day of surgery: call [phone]  Questions prior to your surgery date: call [phone], Monday-Friday, 8am-4pm. If you experience any cold or flu symptoms such as cough, fever, chills, shortness of breath, etc. between now and your scheduled surgery, please notify us at the above number.     Remember:  Do not eat after midnight the night before your surgery  You may drink clear liquids until 6:30 the morning of your surgery.   Clear liquids allowed are: Water, Non-Citrus Juices (without pulp), Carbonated Beverages, Clear Tea, Black Coffee Only (NO MILK, CREAM OR POWDERED CREAMER of any kind), and Gatorade.   Patient Instructions  The night before surgery:  No food after midnight. ONLY clear liquids after midnight  The day of surgery (if you do NOT have diabetes):  Drink ONE (1) Pre-Surgery Clear Ensure by 6:30 the morning of surgery. Drink in one sitting. Do not sip.  This drink was given to you during your hospital  pre-op appointment visit.  Nothing else to drink after completing the  Pre-Surgery Clear Ensure.         If you have questions, please contact your surgeon's office.     Take these medicines the morning of surgery with A SIP OF WATER  acetaminophen (TYLENOL)  famotidine (PEPCID)  felodipine (PLENDIL)  Mometasone Furo-Formoterol Fum 50-5 MCG/ACT AERO    May take these medicines IF NEEDED: albuterol (VENTOLIN HFA).  Please bring inhaler with  you to the hospital Olopatadine HCl (PATADAY OP)     Please follow your Surgeon's instructions on when to stop your Aspirin.  If you have not received instructions, please contact your surgeon's office for directions.        One week prior to surgery, STOP taking any Aleve, Naproxen, Ibuprofen, Motrin, Advil,  Goody's, BC's, all herbal medications, fish oil, and non-prescription vitamins.                     Do NOT Smoke (Tobacco/Vaping) for 24 hours prior to your procedure.  If you use a CPAP at night, you may bring your mask/headgear for your overnight stay.   You will be asked to remove any contacts, glasses, piercing's, hearing aid's, dentures/partials prior to surgery. Please bring cases for these items if needed.    Patients discharged the day of surgery will not be allowed to drive home, and someone needs to stay with them for 24 hours.  SURGICAL WAITING ROOM VISITATION Patients may have no more than 2 support people in the waiting area - these visitors may rotate.   Pre-op nurse will coordinate an appropriate time for 1 ADULT support person, who may not rotate, to accompany patient in pre-op.  Children under the age of 9 must have an adult with them who is not the patient and must remain in the main waiting area with an adult.  If the patient needs to stay at the hospital during part of their recovery, the visitor guidelines for inpatient rooms apply.  Please refer to the [facility] website for the visitor guidelines for any additional information.   If you received a COVID test during your pre-op visit  it is requested that you wear a mask when out in public, stay away from anyone that may not be feeling well and notify  your surgeon if you develop symptoms. If you have been in contact with anyone that has tested positive in the last 10 days please notify you surgeon.      Pre-operative 5 CHG Bathing Instructions   You can play a key role in reducing the risk of infection after surgery. Your skin needs to be as free of germs as possible. You can reduce the number of germs on your skin by washing with CHG (chlorhexidine gluconate) soap before surgery. CHG is an antiseptic soap that kills germs and continues to kill germs even after washing.   DO NOT use if you have an allergy to  chlorhexidine/CHG or antibacterial soaps. If your skin becomes reddened or irritated, stop using the CHG and notify one of our RNs at [phone].   Please shower with the CHG soap starting 4 days before surgery using the following schedule:     Please keep in mind the following:  DO NOT shave, including legs and underarms, starting the day of your first shower.   You may shave your face at any point before/day of surgery.  Place clean sheets on your bed the day you start using CHG soap. Use a clean washcloth (not used since being washed) for each shower. DO NOT sleep with pets once you start using the CHG.   CHG Shower Instructions:  Wash your face and private area with normal soap. If you choose to wash your hair, wash first with your normal shampoo.  After you use shampoo/soap, rinse your hair and body thoroughly to remove shampoo/soap residue.  Turn the water OFF and apply about 3 tablespoons (45 ml) of CHG soap to a CLEAN washcloth.  Apply CHG soap ONLY FROM YOUR NECK DOWN TO YOUR TOES (washing for 3-5 minutes)  DO NOT use CHG soap on face, private areas, open wounds, or sores.  Pay special attention to the area where your surgery is being performed.  If you are having back surgery, having someone wash your back for you may be helpful. Wait 2 minutes after CHG soap is applied, then you may rinse off the CHG soap.  Pat dry with a clean towel  Put on clean clothes/pajamas   If you choose to wear lotion, please use ONLY the CHG-compatible lotions on the back of this paper.   Additional instructions for the day of surgery: DO NOT APPLY any lotions, deodorants or perfumes.   Do not bring valuables to the hospital. [facility] is not responsible for any belongings/valuables. Do not wear nail polish, gel polish, artificial nails, or any other type of covering on natural nails (fingers and toes) Do not wear jewelry or makeup Put on clean/comfortable clothes.  Please brush your teeth.   Ask your nurse before applying any prescription medications to the skin.     CHG Compatible Lotions   Aveeno Moisturizing lotion  Cetaphil Moisturizing Cream  Cetaphil Moisturizing Lotion  Clairol Herbal Essence Moisturizing Lotion, Dry Skin  Clairol Herbal Essence Moisturizing Lotion, Extra Dry Skin  Clairol Herbal Essence Moisturizing Lotion, Normal Skin  Curel Age Defying Therapeutic Moisturizing Lotion with Alpha Hydroxy  Curel Extreme Care Body Lotion  Curel Soothing Hands Moisturizing Hand Lotion  Curel Therapeutic Moisturizing Cream, Fragrance-Free  Curel Therapeutic Moisturizing Lotion, Fragrance-Free  Curel Therapeutic Moisturizing Lotion, Original Formula  Eucerin Daily Replenishing Lotion  Eucerin Dry Skin Therapy Plus Alpha Hydroxy Crme  Eucerin Dry Skin Therapy Plus Alpha Hydroxy Lotion  Eucerin Original Crme  Eucerin Original Lotion  Eucerin Plus  Crme Eucerin Plus Lotion  Eucerin TriLipid Replenishing Lotion  Keri Anti-Bacterial Hand Lotion  Keri Deep Conditioning Original Lotion Dry Skin Formula Softly Scented  Keri Deep Conditioning Original Lotion, Fragrance Free Sensitive Skin Formula  Keri Lotion Fast Absorbing Fragrance Free Sensitive Skin Formula  Keri Lotion Fast Absorbing Softly Scented Dry Skin Formula  Keri Original Lotion  Keri Skin Renewal Lotion Keri Silky Smooth Lotion  Keri Silky Smooth Sensitive Skin Lotion  Nivea Body Creamy Conditioning Oil  Nivea Body Extra Enriched Lotion  Nivea Body Original Lotion  Nivea Body Sheer Moisturizing Lotion Nivea Crme  Nivea Skin Firming Lotion  NutraDerm 30 Skin Lotion  NutraDerm Skin Lotion  NutraDerm Therapeutic Skin Cream  NutraDerm Therapeutic Skin Lotion  ProShield Protective Hand Cream  Provon moisturizing lotion  Please read over the following fact sheets that you were given.

## 2022-10-08 ENCOUNTER — Other Ambulatory Visit: Payer: Self-pay

## 2022-10-08 ENCOUNTER — Encounter (HOSPITAL_COMMUNITY)
Admission: RE | Admit: 2022-10-08 | Discharge: 2022-10-08 | Disposition: A | Discharge status: Home / Self Care | Payer: Medicare Other | Source: Ambulatory Visit | Attending: Orthopaedic Surgery | Admitting: Orthopaedic Surgery

## 2022-10-08 ENCOUNTER — Encounter (HOSPITAL_COMMUNITY): Payer: Self-pay

## 2022-10-08 VITALS — BP 145/77 | HR 72 | Temp 98.0°F | Resp 18 | Ht 69.0 in | Wt 228.3 lb

## 2022-10-08 DIAGNOSIS — Z01812 Encounter for preprocedural laboratory examination: Secondary | ICD-10-CM | POA: Diagnosis not present

## 2022-10-08 DIAGNOSIS — M1611 Unilateral primary osteoarthritis, right hip: Secondary | ICD-10-CM | POA: Insufficient documentation

## 2022-10-08 DIAGNOSIS — Z01818 Encounter for other preprocedural examination: Secondary | ICD-10-CM | POA: Diagnosis present

## 2022-10-08 DIAGNOSIS — I444 Left anterior fascicular block: Secondary | ICD-10-CM | POA: Diagnosis not present

## 2022-10-08 DIAGNOSIS — Z01810 Encounter for preprocedural cardiovascular examination: Secondary | ICD-10-CM | POA: Diagnosis not present

## 2022-10-08 LAB — TYPE AND SCREEN
ABO/RH(D): B POS
Antibody Screen: NEGATIVE

## 2022-10-08 LAB — COMPREHENSIVE METABOLIC PANEL
ALT: 20 U/L (ref 0–44)
AST: 16 U/L (ref 15–41)
Albumin: 3.3 g/dL — ABNORMAL LOW (ref 3.5–5.0)
Alkaline Phosphatase: 52 U/L (ref 38–126)
Anion gap: 7 (ref 5–15)
BUN: 18 mg/dL (ref 8–23)
CO2: 25 mmol/L (ref 22–32)
Calcium: 9 mg/dL (ref 8.9–10.3)
Chloride: 108 mmol/L (ref 98–111)
Creatinine, Ser: 1.2 mg/dL — ABNORMAL HIGH (ref 0.44–1.00)
GFR, Estimated: 45 mL/min — ABNORMAL LOW (ref >60)
Glucose, Bld: 87 mg/dL (ref 70–99)
Potassium: 3.8 mmol/L (ref 3.5–5.1)
Sodium: 140 mmol/L (ref 135–145)
Total Bilirubin: 0.6 mg/dL (ref 0.3–1.2)
Total Protein: 6.9 g/dL (ref 6.5–8.1)

## 2022-10-08 LAB — CBC
HCT: 39.5 % (ref 36.0–46.0)
Hemoglobin: 12.8 g/dL (ref 12.0–15.0)
MCH: 29.6 pg (ref 26.0–34.0)
MCHC: 32.4 g/dL (ref 30.0–36.0)
MCV: 91.4 fL (ref 80.0–100.0)
Platelets: 214 10*3/uL (ref 150–400)
RBC: 4.32 MIL/uL (ref 3.87–5.11)
RDW: 14 % (ref 11.5–15.5)
WBC: 5.8 10*3/uL (ref 4.0–10.5)
nRBC: 0 % (ref 0.0–0.2)

## 2022-10-08 LAB — SURGICAL PCR SCREEN
MRSA, PCR: NEGATIVE
Staphylococcus aureus: POSITIVE — AB

## 2022-10-08 MED ORDER — CEFAZOLIN SODIUM-DEXTROSE 2-4 GM/100ML-% IV SOLN: 2.0000 g | INTRAVENOUS | Status: DC

## 2022-10-08 MED ORDER — TRANEXAMIC ACID-NACL 1000-0.7 MG/100ML-% IV SOLN: 1000.0000 mg | INTRAVENOUS | Status: DC

## 2022-10-08 MED ORDER — POVIDONE-IODINE 10 % EX SWAB: 2.0000 | Freq: Once | CUTANEOUS | Status: DC

## 2022-10-08 NOTE — Progress Notes (Signed)
PCP - Dr. Danise Edge Cardiologist -  Denies  PPM/ICD - Denies Device Orders - N/A Rep Notified - N/A  Chest x-ray - 08/14/19 EKG - 10/08/22 completed in PAT. NSR  Stress Test - Denies ECHO -  Denies Cardiac Cath - Denies  Sleep Study - Denies CPAP - N/A  Fasting Blood Sugar - Denies being a diabetic Checks Blood Sugar times a day - denies taking blood sugars at home  Last dose of GLP1 agonist-  N/A GLP1 instructions: N/A  Blood Thinner Instructions: DENIES Aspirin Instructions: PT stated she has already quit taking her Aspirin 81 mg.  ERAS Protcol - Y PRE-SURGERY Ensure   COVID TEST-  N   Anesthesia review: N  Patient denies shortness of breath, fever, cough and chest pain at PAT appointment. Patient denies any respiratory illness or issues at this time.    All instructions explained to the patient, with a verbal understanding of the material. Patient agrees to go over the instructions while at home for a better understanding. Patient also instructed to self quarantine after being tested for COVID-19. The opportunity to ask questions was provided.

## 2022-10-15 ENCOUNTER — Ambulatory Visit
Admission: RE | Admit: 2022-10-15 | Discharge: 2022-10-15 | Disposition: A | Discharge status: Home / Self Care | Payer: Medicare Other | Source: Ambulatory Visit | Attending: Family Medicine | Admitting: Family Medicine

## 2022-10-15 DIAGNOSIS — R928 Other abnormal and inconclusive findings on diagnostic imaging of breast: Secondary | ICD-10-CM

## 2022-10-19 NOTE — H&P (Signed)
TOTAL HIP ADMISSION H&P  Patient is admitted for right total hip arthroplasty.  Subjective:  Chief Complaint: right hip pain  HPI: Terri Mckee, 82 y.o. female, has a history of pain and functional disability in the right hip(s) due to arthritis and patient has failed non-surgical conservative treatments for greater than 12 weeks to include NSAID's and/or analgesics, corticosteriod injections, flexibility and strengthening excercises, use of assistive devices, weight reduction as appropriate, and activity modification.  Onset of symptoms was gradual starting 3 years ago with gradually worsening course since that time.The patient noted no past surgery on the right hip(s).  Patient currently rates pain in the right hip at 10 out of 10 with activity. Patient has night pain, worsening of pain with activity and weight bearing, pain that interfers with activities of daily living, and pain with passive range of motion. Patient has evidence of subchondral cysts, subchondral sclerosis, periarticular osteophytes, and joint space narrowing by imaging studies. This condition presents safety issues increasing the risk of falls.  There is no current active infection.  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   Class 1 obesity with serious comorbidity and body mass index (BMI) of 31.0 to 31.9 in adult 03/16/2018   Muscle cramp, nocturnal 06/14/2017   Preventative health care 04/05/2016   Vitamin D deficiency 04/03/2016   Cataracts, bilateral 07/25/2015   Left shoulder pain 01/29/2015   Breast cancer screening 09/02/2014   Decreased visual acuity 09/02/2014   Hearing loss 09/02/2014   Bilateral hip pain 09/02/2014   Medicare annual wellness visit,  subsequent 09/02/2014   Osteopenia 08/17/2014   Right knee pain 09/24/2013   Low back pain 12/03/2012   Skin lesion 07/10/2012   Urinary incontinence 07/10/2012   Cervical cancer screening 07/07/2012   Cough 04/01/2012   Hoarseness of voice 03/13/2012   Cervical radiculopathy 12/03/2011   GERD 09/13/2009   Pain in both knees 12/21/2008   ECZEMA, 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   Hyperlipidemia, mixed 10/28/2007   INSOMNIA 10/28/2007   Allergic rhinitis 07/01/2007   Muscle cramps 03/21/2007   Essential hypertension 08/06/2006   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   Cataract    Cataracts, bilateral 07/25/2015   Cervical cancer screening 07/07/2012   Colon polyps    Elevated serum creatinine    with ACE inhibitor (Normal MRA of renal arteries)   GERD (gastroesophageal reflux disease)    History of cyst of breast    Hoarseness of voice 03/13/2012   Hypertension    Lactose intolerance    causes gas   Medicare annual wellness visit, subsequent 09/02/2014   Muscle spasm of left lower extremity 03/21/2007   Qualifier: Diagnosis of  By: Nena Jordan    Osteoarthritis    Osteopenia 08/17/2014   Preventative health care 04/05/2016   Shortness of breath    Skin lesion 07/10/2012   Urinary incontinence 07/10/2012   Can't always  get to bathroom on time   Vitamin D deficiency 04/03/2016    Past Surgical History:  Procedure Laterality Date   APPENDECTOMY     BREAST CYST EXCISION Bilateral    right breast, twice   OVARY SURGERY     right ovary & tube replacement   WRIST GANGLION EXCISION     rt    No current facility-administered medications for this encounter.   Current Outpatient Medications  Medication Sig Dispense  Refill Last Dose   acetaminophen (TYLENOL) 650 MG CR tablet Take 650 mg by mouth in the morning, at noon, and at bedtime.      albuterol (VENTOLIN HFA) 108 (90 Base) MCG/ACT inhaler Inhale 2 puffs into the lungs every 6 (six) hours as needed for wheezing or shortness of breath. 18 g 5    aspirin EC 81 MG tablet Take 81 mg by mouth in the morning.      calcium carbonate (OS-CAL) 600 MG TABS Take 600 mg by mouth in the morning.      Cholecalciferol (VITAMIN D3 PO) Take 1 tablet by mouth in the morning.      famotidine (PEPCID) 20 MG tablet TAKE 1 TABLET(20 MG) BY MOUTH TWICE DAILY 180 tablet 0    felodipine (PLENDIL) 10 MG 24 hr tablet TAKE 1 TABLET(10 MG) BY MOUTH DAILY 90 tablet 1    GARLIC PO Take 1 capsule by mouth in the morning.      Homeopathic Products (LEG CRAMPS SL) Place 3 tablets under the tongue at bedtime.      Mometasone Furo-Formoterol Fum 50-5 MCG/ACT AERO Inhale 2 puffs into the lungs 2 (two) times daily. 1 each 5    Multiple Vitamin (MULTIVITAMIN WITH MINERALS) TABS tablet Take 1 tablet by mouth in the morning.      Olopatadine HCl (PATADAY OP) Place 1 drop into both eyes 2 (two) times daily as needed (allergy/irritated eyes.).      Omega-3 Fatty Acids (OMEGA 3 PO) Take 1 capsule by mouth in the morning and at bedtime. OmegaXL      POTASSIUM PO Take 1 tablet by mouth in the morning.      Vibegron (GEMTESA) 75 MG TABS Take 75 mg by mouth daily in the afternoon.      XYZAL 5 MG tablet Take 1 tablet (5 mg total) by mouth every evening. 90 tablet 3    Blood Glucose Monitoring Suppl DEVI 1 each by Does not apply route in the morning, at noon, and at bedtime. May substitute to any manufacturer covered by patient's insurance. 1 each 0    Glucose Blood (BLOOD GLUCOSE TEST STRIPS) STRP 1 each by In Vitro route in the morning, at noon, and at bedtime. May substitute to any manufacturer covered by patient's insurance. 100 strip 0    Lancet Device MISC 1 each by Does not apply route in the  morning, at noon, and at bedtime. May substitute to any manufacturer covered by patient's insurance. 1 each 0    Lancets Misc. MISC 1 each by Does not apply route in the morning, at noon, and at bedtime. May substitute to any manufacturer covered by patient's insurance. 100 each 0    Allergies  Allergen Reactions   Allegra [Fexofenadine] Shortness Of Breath   Haemophilus Influenzae Vaccines Nausea And Vomiting   Erythromycin Nausea Only   Oxycodone-Acetaminophen Nausea And Vomiting   Propoxyphene N-Acetaminophen Nausea And Vomiting      severe vomiting   Zocor [Simvastatin] Other (See Comments)  Myalgia    Social History   Tobacco Use   Smoking status: Former    Current packs/day: 0.00    Types: Cigarettes    Quit date: 01/13/1991    Years since quitting: 31.7   Smokeless tobacco: Never   Tobacco comments:    quit 20 years ago 1/2 ppd x 40 years  Substance Use Topics   Alcohol use: No    Family History  Problem Relation Age of Onset   Brain cancer Mother        deceased age 47   Alcohol abuse Mother    Obesity Mother    Other Brother        died of sepsis   Gout Brother    Alcohol abuse Father    Cancer Sister        throat   Pulmonary embolism Daughter    Mental illness Daughter        anxiety   Cancer Daughter        uterine?   Diabetes Maternal Aunt    Diabetes Maternal Aunt    Seizures Maternal Aunt    Birth defects Maternal Aunt    Lupus Grandchild    Sickle cell anemia Grandchild    Colon cancer Neg Hx    Breast cancer Neg Hx      Review of Systems  Objective:  Physical Exam Vitals reviewed.  Constitutional:      Appearance: Normal appearance. She is obese.  HENT:     Head: Normocephalic and atraumatic.  Eyes:     Extraocular Movements: Extraocular movements intact.     Pupils: Pupils are equal, round, and reactive to light.  Cardiovascular:     Rate and Rhythm: Normal rate.     Pulses: Normal pulses.  Pulmonary:     Effort: Pulmonary  effort is normal.     Breath sounds: Normal breath sounds.  Abdominal:     Palpations: Abdomen is soft.  Musculoskeletal:     Cervical back: Normal range of motion and neck supple.     Right hip: Tenderness and bony tenderness present. Decreased range of motion. Decreased strength.  Neurological:     Mental Status: She is alert and oriented to person, place, and time.  Psychiatric:        Behavior: Behavior normal.     Vital signs in last 24 hours:    Labs:   Estimated body mass index is 33.71 kg/m as calculated from the following:   Height as of 10/08/22: 5\' 9"  (1.753 m).   Weight as of 10/08/22: 103.6 kg.   Imaging Review Plain radiographs demonstrate severe degenerative joint disease of the right hip(s). The bone quality appears to be good for age and reported activity level.      Assessment/Plan:  End stage arthritis, right hip(s)  The patient history, physical examination, clinical judgement of the provider and imaging studies are consistent with end stage degenerative joint disease of the right hip(s) and total hip arthroplasty is deemed medically necessary. The treatment options including medical management, injection therapy, arthroscopy and arthroplasty were discussed at length. The risks and benefits of total hip arthroplasty were presented and reviewed. The risks due to aseptic loosening, infection, stiffness, dislocation/subluxation,  thromboembolic complications and other imponderables were discussed.  The patient acknowledged the explanation, agreed to proceed with the plan and consent was signed. Patient is being admitted for inpatient treatment for surgery, pain control, PT, OT, prophylactic antibiotics, VTE prophylaxis, progressive ambulation and ADL's and discharge  planning.The patient is planning to be discharged home with home health services

## 2022-10-20 ENCOUNTER — Ambulatory Visit (HOSPITAL_COMMUNITY): Payer: Medicare Other

## 2022-10-20 ENCOUNTER — Other Ambulatory Visit: Payer: Self-pay

## 2022-10-20 ENCOUNTER — Ambulatory Visit (HOSPITAL_BASED_OUTPATIENT_CLINIC_OR_DEPARTMENT_OTHER): Payer: Medicare Other | Admitting: Anesthesiology

## 2022-10-20 ENCOUNTER — Encounter (HOSPITAL_COMMUNITY)
Admission: RE | Disposition: A | Discharge status: Skilled Nursing Facility | Payer: Self-pay | Source: Ambulatory Visit | Attending: Orthopaedic Surgery

## 2022-10-20 ENCOUNTER — Inpatient Hospital Stay (HOSPITAL_COMMUNITY)
Admission: RE | Admit: 2022-10-20 | Discharge: 2022-10-23 | DRG: 470 | Disposition: A | Discharge status: Skilled Nursing Facility | Payer: Medicare Other | Source: Ambulatory Visit | Attending: Orthopaedic Surgery | Admitting: Orthopaedic Surgery

## 2022-10-20 ENCOUNTER — Ambulatory Visit (HOSPITAL_COMMUNITY): Payer: Medicare Other | Admitting: Physician Assistant

## 2022-10-20 ENCOUNTER — Encounter (HOSPITAL_COMMUNITY): Payer: Self-pay | Admitting: Orthopaedic Surgery

## 2022-10-20 DIAGNOSIS — I10 Essential (primary) hypertension: Secondary | ICD-10-CM

## 2022-10-20 DIAGNOSIS — Z833 Family history of diabetes mellitus: Secondary | ICD-10-CM

## 2022-10-20 DIAGNOSIS — Q631 Lobulated, fused and horseshoe kidney: Secondary | ICD-10-CM

## 2022-10-20 DIAGNOSIS — Z7982 Long term (current) use of aspirin: Secondary | ICD-10-CM

## 2022-10-20 DIAGNOSIS — Z87448 Personal history of other diseases of urinary system: Secondary | ICD-10-CM

## 2022-10-20 DIAGNOSIS — E559 Vitamin D deficiency, unspecified: Secondary | ICD-10-CM | POA: Diagnosis present

## 2022-10-20 DIAGNOSIS — Z811 Family history of alcohol abuse and dependence: Secondary | ICD-10-CM

## 2022-10-20 DIAGNOSIS — M1611 Unilateral primary osteoarthritis, right hip: Secondary | ICD-10-CM | POA: Diagnosis not present

## 2022-10-20 DIAGNOSIS — Z832 Family history of diseases of the blood and blood-forming organs and certain disorders involving the immune mechanism: Secondary | ICD-10-CM

## 2022-10-20 DIAGNOSIS — Z87891 Personal history of nicotine dependence: Secondary | ICD-10-CM

## 2022-10-20 DIAGNOSIS — Z8349 Family history of other endocrine, nutritional and metabolic diseases: Secondary | ICD-10-CM

## 2022-10-20 DIAGNOSIS — Z9049 Acquired absence of other specified parts of digestive tract: Secondary | ICD-10-CM

## 2022-10-20 DIAGNOSIS — Z818 Family history of other mental and behavioral disorders: Secondary | ICD-10-CM

## 2022-10-20 DIAGNOSIS — Z79899 Other long term (current) drug therapy: Secondary | ICD-10-CM

## 2022-10-20 DIAGNOSIS — Z6833 Body mass index (BMI) 33.0-33.9, adult: Secondary | ICD-10-CM

## 2022-10-20 DIAGNOSIS — E88819 Insulin resistance, unspecified: Secondary | ICD-10-CM | POA: Diagnosis present

## 2022-10-20 DIAGNOSIS — Z887 Allergy status to serum and vaccine status: Secondary | ICD-10-CM

## 2022-10-20 DIAGNOSIS — Z8269 Family history of other diseases of the musculoskeletal system and connective tissue: Secondary | ICD-10-CM

## 2022-10-20 DIAGNOSIS — M8580 Other specified disorders of bone density and structure, unspecified site: Secondary | ICD-10-CM | POA: Diagnosis present

## 2022-10-20 DIAGNOSIS — Z808 Family history of malignant neoplasm of other organs or systems: Secondary | ICD-10-CM

## 2022-10-20 DIAGNOSIS — Z885 Allergy status to narcotic agent status: Secondary | ICD-10-CM

## 2022-10-20 DIAGNOSIS — Z860100 Personal history of colon polyps, unspecified: Secondary | ICD-10-CM

## 2022-10-20 DIAGNOSIS — Z881 Allergy status to other antibiotic agents status: Secondary | ICD-10-CM

## 2022-10-20 DIAGNOSIS — Z8739 Personal history of other diseases of the musculoskeletal system and connective tissue: Secondary | ICD-10-CM

## 2022-10-20 DIAGNOSIS — E739 Lactose intolerance, unspecified: Secondary | ICD-10-CM | POA: Diagnosis present

## 2022-10-20 DIAGNOSIS — M1612 Unilateral primary osteoarthritis, left hip: Secondary | ICD-10-CM | POA: Diagnosis present

## 2022-10-20 DIAGNOSIS — G4700 Insomnia, unspecified: Secondary | ICD-10-CM | POA: Diagnosis present

## 2022-10-20 DIAGNOSIS — Z8619 Personal history of other infectious and parasitic diseases: Secondary | ICD-10-CM

## 2022-10-20 DIAGNOSIS — E782 Mixed hyperlipidemia: Secondary | ICD-10-CM | POA: Diagnosis present

## 2022-10-20 DIAGNOSIS — Z888 Allergy status to other drugs, medicaments and biological substances status: Secondary | ICD-10-CM

## 2022-10-20 DIAGNOSIS — Z862 Personal history of diseases of the blood and blood-forming organs and certain disorders involving the immune mechanism: Secondary | ICD-10-CM

## 2022-10-20 DIAGNOSIS — K219 Gastro-esophageal reflux disease without esophagitis: Secondary | ICD-10-CM | POA: Diagnosis present

## 2022-10-20 DIAGNOSIS — E669 Obesity, unspecified: Secondary | ICD-10-CM | POA: Diagnosis present

## 2022-10-20 DIAGNOSIS — Z9889 Other specified postprocedural states: Secondary | ICD-10-CM

## 2022-10-20 DIAGNOSIS — H9190 Unspecified hearing loss, unspecified ear: Secondary | ICD-10-CM | POA: Diagnosis present

## 2022-10-20 DIAGNOSIS — Z820 Family history of epilepsy and other diseases of the nervous system: Secondary | ICD-10-CM

## 2022-10-20 DIAGNOSIS — Z96641 Presence of right artificial hip joint: Secondary | ICD-10-CM

## 2022-10-20 HISTORY — PX: TOTAL HIP ARTHROPLASTY: SHX124

## 2022-10-20 LAB — ABO/RH: ABO/RH(D): B POS

## 2022-10-20 SURGERY — ARTHROPLASTY, HIP, TOTAL, ANTERIOR APPROACH
Anesthesia: General | Site: Hip | Laterality: Right

## 2022-10-20 MED ORDER — FENTANYL CITRATE (PF) 100 MCG/2ML IJ SOLN
INTRAMUSCULAR | Status: AC
  Filled 2022-10-20: qty 2

## 2022-10-20 MED ORDER — ROCURONIUM BROMIDE 10 MG/ML (PF) SYRINGE
PREFILLED_SYRINGE | INTRAVENOUS | Status: DC | PRN
  Administered 2022-10-20: 10 mg via INTRAVENOUS
  Administered 2022-10-20: 50 mg via INTRAVENOUS

## 2022-10-20 MED ORDER — HYDROCODONE-ACETAMINOPHEN 7.5-325 MG PO TABS
1.0000 | ORAL_TABLET | ORAL | Status: DC | PRN
  Administered 2022-10-20: 2 via ORAL

## 2022-10-20 MED ORDER — LACTATED RINGERS IV SOLN: INTRAVENOUS | Status: DC

## 2022-10-20 MED ORDER — HYDROCODONE-ACETAMINOPHEN 7.5-325 MG PO TABS
ORAL_TABLET | ORAL | Status: AC
  Filled 2022-10-20: qty 2

## 2022-10-20 MED ORDER — GLYCOPYRROLATE PF 0.2 MG/ML IJ SOSY
PREFILLED_SYRINGE | INTRAMUSCULAR | Status: AC
  Filled 2022-10-20: qty 1

## 2022-10-20 MED ORDER — METOCLOPRAMIDE HCL 5 MG PO TABS: 5.0000 mg | ORAL_TABLET | Freq: Three times a day (TID) | ORAL | Status: DC | PRN

## 2022-10-20 MED ORDER — CEFAZOLIN SODIUM-DEXTROSE 1-4 GM/50ML-% IV SOLN
1.0000 g | Freq: Four times a day (QID) | INTRAVENOUS | Status: AC
  Administered 2022-10-20 (×2): 1 g via INTRAVENOUS
  Filled 2022-10-20 (×2): qty 50

## 2022-10-20 MED ORDER — FENTANYL CITRATE (PF) 250 MCG/5ML IJ SOLN
INTRAMUSCULAR | Status: AC
  Filled 2022-10-20: qty 5

## 2022-10-20 MED ORDER — ONDANSETRON HCL 4 MG/2ML IJ SOLN
INTRAMUSCULAR | Status: AC
  Filled 2022-10-20: qty 2

## 2022-10-20 MED ORDER — KETAMINE HCL 50 MG/5ML IJ SOSY
PREFILLED_SYRINGE | INTRAMUSCULAR | Status: AC
  Filled 2022-10-20: qty 5

## 2022-10-20 MED ORDER — FAMOTIDINE 20 MG PO TABS
20.0000 mg | ORAL_TABLET | Freq: Two times a day (BID) | ORAL | Status: DC
  Administered 2022-10-20 – 2022-10-23 (×6): 20 mg via ORAL
  Filled 2022-10-20 (×6): qty 1

## 2022-10-20 MED ORDER — PROPOFOL 10 MG/ML IV BOLUS
INTRAVENOUS | Status: DC | PRN
  Administered 2022-10-20: 100 mg via INTRAVENOUS

## 2022-10-20 MED ORDER — 0.9 % SODIUM CHLORIDE (POUR BTL) OPTIME
TOPICAL | Status: DC | PRN
  Administered 2022-10-20: 1000 mL

## 2022-10-20 MED ORDER — POVIDONE-IODINE 10 % EX SWAB
2.0000 | Freq: Once | CUTANEOUS | Status: AC
  Administered 2022-10-20: 2 via TOPICAL

## 2022-10-20 MED ORDER — MENTHOL 3 MG MT LOZG: 1.0000 | LOZENGE | OROMUCOSAL | Status: DC | PRN

## 2022-10-20 MED ORDER — ORAL CARE MOUTH RINSE: 15.0000 mL | Freq: Once | OROMUCOSAL | Status: DC

## 2022-10-20 MED ORDER — METOCLOPRAMIDE HCL 5 MG/ML IJ SOLN: 5.0000 mg | Freq: Three times a day (TID) | INTRAMUSCULAR | Status: DC | PRN

## 2022-10-20 MED ORDER — SODIUM CHLORIDE 0.9 % IR SOLN
Status: DC | PRN
  Administered 2022-10-20: 1000 mL

## 2022-10-20 MED ORDER — ALUM & MAG HYDROXIDE-SIMETH 200-200-20 MG/5ML PO SUSP: 30.0000 mL | ORAL | Status: DC | PRN

## 2022-10-20 MED ORDER — LIDOCAINE 2% (20 MG/ML) 5 ML SYRINGE
INTRAMUSCULAR | Status: DC | PRN
  Administered 2022-10-20: 50 mg via INTRAVENOUS

## 2022-10-20 MED ORDER — PHENYLEPHRINE 80 MCG/ML (10ML) SYRINGE FOR IV PUSH (FOR BLOOD PRESSURE SUPPORT)
PREFILLED_SYRINGE | INTRAVENOUS | Status: AC
  Filled 2022-10-20: qty 10

## 2022-10-20 MED ORDER — AMISULPRIDE (ANTIEMETIC) 5 MG/2ML IV SOLN
INTRAVENOUS | Status: AC
  Filled 2022-10-20: qty 4

## 2022-10-20 MED ORDER — PHENOL 1.4 % MT LIQD: 1.0000 | OROMUCOSAL | Status: DC | PRN

## 2022-10-20 MED ORDER — SODIUM CHLORIDE 0.9 % IV SOLN: INTRAVENOUS | Status: DC

## 2022-10-20 MED ORDER — DEXAMETHASONE SODIUM PHOSPHATE 10 MG/ML IJ SOLN
INTRAMUSCULAR | Status: DC | PRN
  Administered 2022-10-20: 5 mg via INTRAVENOUS

## 2022-10-20 MED ORDER — ONDANSETRON HCL 4 MG/2ML IJ SOLN
4.0000 mg | Freq: Four times a day (QID) | INTRAMUSCULAR | Status: DC | PRN
  Administered 2022-10-23: 4 mg via INTRAVENOUS
  Filled 2022-10-20: qty 2

## 2022-10-20 MED ORDER — ACETAMINOPHEN 325 MG PO TABS: 325.0000 mg | ORAL_TABLET | Freq: Four times a day (QID) | ORAL | Status: DC | PRN

## 2022-10-20 MED ORDER — DOCUSATE SODIUM 100 MG PO CAPS
100.0000 mg | ORAL_CAPSULE | Freq: Two times a day (BID) | ORAL | Status: DC
  Administered 2022-10-20 – 2022-10-23 (×6): 100 mg via ORAL
  Filled 2022-10-20 (×6): qty 1

## 2022-10-20 MED ORDER — LACTATED RINGERS IV SOLN
INTRAVENOUS | Status: DC | PRN
Start: 2022-10-20 — End: 2022-10-20

## 2022-10-20 MED ORDER — TRANEXAMIC ACID-NACL 1000-0.7 MG/100ML-% IV SOLN
1000.0000 mg | INTRAVENOUS | Status: AC
  Administered 2022-10-20: 1000 mg via INTRAVENOUS
  Filled 2022-10-20: qty 100

## 2022-10-20 MED ORDER — ASPIRIN 81 MG PO CHEW
81.0000 mg | CHEWABLE_TABLET | Freq: Two times a day (BID) | ORAL | Status: DC
  Administered 2022-10-20 – 2022-10-23 (×6): 81 mg via ORAL
  Filled 2022-10-20 (×6): qty 1

## 2022-10-20 MED ORDER — ONDANSETRON HCL 4 MG/2ML IJ SOLN
INTRAMUSCULAR | Status: DC | PRN
  Administered 2022-10-20: 4 mg via INTRAVENOUS

## 2022-10-20 MED ORDER — METHOCARBAMOL 500 MG PO TABS
ORAL_TABLET | ORAL | Status: AC
  Filled 2022-10-20: qty 1

## 2022-10-20 MED ORDER — ACETAMINOPHEN 500 MG PO TABS
1000.0000 mg | ORAL_TABLET | Freq: Once | ORAL | Status: DC
  Filled 2022-10-20: qty 2

## 2022-10-20 MED ORDER — SUGAMMADEX SODIUM 200 MG/2ML IV SOLN
INTRAVENOUS | Status: DC | PRN
  Administered 2022-10-20: 200 mg via INTRAVENOUS

## 2022-10-20 MED ORDER — ONDANSETRON HCL 4 MG/2ML IJ SOLN
4.0000 mg | Freq: Once | INTRAMUSCULAR | Status: AC | PRN
  Administered 2022-10-20: 4 mg via INTRAVENOUS

## 2022-10-20 MED ORDER — DIPHENHYDRAMINE HCL 12.5 MG/5ML PO ELIX: 12.5000 mg | ORAL_SOLUTION | ORAL | Status: DC | PRN

## 2022-10-20 MED ORDER — MOMETASONE FURO-FORMOTEROL FUM 200-5 MCG/ACT IN AERO
2.0000 | INHALATION_SPRAY | Freq: Two times a day (BID) | RESPIRATORY_TRACT | Status: DC
  Administered 2022-10-20 – 2022-10-23 (×6): 2 via RESPIRATORY_TRACT
  Filled 2022-10-20: qty 8.8

## 2022-10-20 MED ORDER — MIDAZOLAM HCL 2 MG/2ML IJ SOLN
INTRAMUSCULAR | Status: AC
  Filled 2022-10-20: qty 2

## 2022-10-20 MED ORDER — AMISULPRIDE (ANTIEMETIC) 5 MG/2ML IV SOLN
10.0000 mg | Freq: Once | INTRAVENOUS | Status: AC
  Administered 2022-10-20: 10 mg via INTRAVENOUS

## 2022-10-20 MED ORDER — FENTANYL CITRATE (PF) 100 MCG/2ML IJ SOLN
25.0000 ug | INTRAMUSCULAR | Status: DC | PRN
  Administered 2022-10-20 (×3): 50 ug via INTRAVENOUS

## 2022-10-20 MED ORDER — MORPHINE SULFATE (PF) 2 MG/ML IV SOLN: 0.5000 mg | INTRAVENOUS | Status: DC | PRN

## 2022-10-20 MED ORDER — CEFAZOLIN SODIUM-DEXTROSE 2-4 GM/100ML-% IV SOLN
2.0000 g | INTRAVENOUS | Status: AC
  Administered 2022-10-20: 2 g via INTRAVENOUS
  Filled 2022-10-20: qty 100

## 2022-10-20 MED ORDER — CHLORHEXIDINE GLUCONATE 0.12 % MT SOLN
15.0000 mL | Freq: Once | OROMUCOSAL | Status: DC
  Filled 2022-10-20: qty 15

## 2022-10-20 MED ORDER — ONDANSETRON HCL 4 MG PO TABS: 4.0000 mg | ORAL_TABLET | Freq: Four times a day (QID) | ORAL | Status: DC | PRN

## 2022-10-20 MED ORDER — FENTANYL CITRATE (PF) 250 MCG/5ML IJ SOLN
INTRAMUSCULAR | Status: DC | PRN
  Administered 2022-10-20: 25 ug via INTRAVENOUS
  Administered 2022-10-20: 50 ug via INTRAVENOUS
  Administered 2022-10-20: 25 ug via INTRAVENOUS

## 2022-10-20 MED ORDER — FELODIPINE ER 5 MG PO TB24
10.0000 mg | ORAL_TABLET | Freq: Every day | ORAL | Status: DC
  Administered 2022-10-21 – 2022-10-23 (×3): 10 mg via ORAL
  Filled 2022-10-20 (×3): qty 2

## 2022-10-20 MED ORDER — MIDAZOLAM HCL 2 MG/2ML IJ SOLN
INTRAMUSCULAR | Status: DC | PRN
  Administered 2022-10-20: 1 mg via INTRAVENOUS

## 2022-10-20 MED ORDER — MOMETASONE FURO-FORMOTEROL FUM 50-5 MCG/ACT IN AERO: 2.0000 | INHALATION_SPRAY | Freq: Two times a day (BID) | RESPIRATORY_TRACT | Status: DC

## 2022-10-20 MED ORDER — METHOCARBAMOL 500 MG PO TABS
500.0000 mg | ORAL_TABLET | Freq: Four times a day (QID) | ORAL | Status: DC | PRN
  Administered 2022-10-20: 500 mg via ORAL

## 2022-10-20 MED ORDER — ALBUTEROL SULFATE (2.5 MG/3ML) 0.083% IN NEBU: 3.0000 mL | INHALATION_SOLUTION | Freq: Four times a day (QID) | RESPIRATORY_TRACT | Status: DC | PRN

## 2022-10-20 MED ORDER — DEXAMETHASONE SODIUM PHOSPHATE 10 MG/ML IJ SOLN
INTRAMUSCULAR | Status: AC
  Filled 2022-10-20: qty 1

## 2022-10-20 MED ORDER — BUPIVACAINE IN DEXTROSE 0.75-8.25 % IT SOLN
INTRATHECAL | Status: DC | PRN
Start: 2022-10-20 — End: 2022-10-20
  Administered 2022-10-20: 1.6 mL via INTRATHECAL

## 2022-10-20 MED ORDER — METHOCARBAMOL 1000 MG/10ML IJ SOLN: 500.0000 mg | Freq: Four times a day (QID) | INTRAVENOUS | Status: DC | PRN

## 2022-10-20 MED ORDER — PROPOFOL 1000 MG/100ML IV EMUL
INTRAVENOUS | Status: AC
  Filled 2022-10-20: qty 100

## 2022-10-20 MED ORDER — HYDROCODONE-ACETAMINOPHEN 5-325 MG PO TABS
1.0000 | ORAL_TABLET | ORAL | Status: DC | PRN
  Administered 2022-10-21 (×2): 2 via ORAL
  Administered 2022-10-21: 1 via ORAL
  Administered 2022-10-22 – 2022-10-23 (×4): 2 via ORAL
  Filled 2022-10-20 (×3): qty 2
  Filled 2022-10-20: qty 1
  Filled 2022-10-20 (×3): qty 2

## 2022-10-20 SURGICAL SUPPLY — 55 items
APL SKNCLS STERI-STRIP NONHPOA (GAUZE/BANDAGES/DRESSINGS) ×1
BAG COUNTER SPONGE SURGICOUNT (BAG) ×1 IMPLANT
BAG SPNG CNTER NS LX DISP (BAG) ×1
BENZOIN TINCTURE PRP APPL 2/3 (GAUZE/BANDAGES/DRESSINGS) ×1 IMPLANT
BLADE CLIPPER SURG (BLADE) IMPLANT
BLADE SAW SGTL 18X1.27X75 (BLADE) ×1 IMPLANT
COVER SURGICAL LIGHT HANDLE (MISCELLANEOUS) ×1 IMPLANT
DRAPE C-ARM 42X72 X-RAY (DRAPES) ×1 IMPLANT
DRAPE STERI IOBAN 125X83 (DRAPES) ×1 IMPLANT
DRAPE U-SHAPE 47X51 STRL (DRAPES) ×3 IMPLANT
DRSG AQUACEL AG ADV 3.5X10 (GAUZE/BANDAGES/DRESSINGS) ×1 IMPLANT
DURAPREP 26ML APPLICATOR (WOUND CARE) ×1 IMPLANT
ELECT BLADE 4.0 EZ CLEAN MEGAD (MISCELLANEOUS)
ELECT BLADE 6.5 EXT (BLADE) IMPLANT
ELECT REM PT RETURN 9FT ADLT (ELECTROSURGICAL) ×1
ELECTRODE BLDE 4.0 EZ CLN MEGD (MISCELLANEOUS) ×1 IMPLANT
ELECTRODE REM PT RTRN 9FT ADLT (ELECTROSURGICAL) ×1 IMPLANT
FACESHIELD WRAPAROUND (MASK) ×2 IMPLANT
FACESHIELD WRAPAROUND OR TEAM (MASK) ×2 IMPLANT
GLOVE BIOGEL PI IND STRL 8 (GLOVE) ×2 IMPLANT
GLOVE ECLIPSE 8.0 STRL XLNG CF (GLOVE) ×1 IMPLANT
GLOVE ORTHO TXT STRL SZ7.5 (GLOVE) ×2 IMPLANT
GOWN STRL REUS W/ TWL LRG LVL3 (GOWN DISPOSABLE) ×2 IMPLANT
GOWN STRL REUS W/ TWL XL LVL3 (GOWN DISPOSABLE) ×2 IMPLANT
GOWN STRL REUS W/TWL LRG LVL3 (GOWN DISPOSABLE) ×2
GOWN STRL REUS W/TWL XL LVL3 (GOWN DISPOSABLE) ×2
HANDPIECE INTERPULSE COAX TIP (DISPOSABLE) ×1
HEAD M SROM 36MM PLUS 1.5 (Hips) IMPLANT
KIT BASIN OR (CUSTOM PROCEDURE TRAY) ×1 IMPLANT
KIT TURNOVER KIT B (KITS) ×1 IMPLANT
LINER NEUTRAL 52X36MM PLUS 4 (Liner) IMPLANT
MANIFOLD NEPTUNE II (INSTRUMENTS) ×1 IMPLANT
NS IRRIG 1000ML POUR BTL (IV SOLUTION) ×1 IMPLANT
PACK TOTAL JOINT (CUSTOM PROCEDURE TRAY) ×1 IMPLANT
PAD ARMBOARD 7.5X6 YLW CONV (MISCELLANEOUS) ×1 IMPLANT
PIN SECTOR W/GRIP ACE CUP 52MM (Hips) IMPLANT
SET HNDPC FAN SPRY TIP SCT (DISPOSABLE) ×1 IMPLANT
SROM M HEAD 36MM PLUS 1.5 (Hips) ×1 IMPLANT
STAPLER VISISTAT 35W (STAPLE) IMPLANT
STEM CORAIL KA12 (Stem) IMPLANT
STRIP CLOSURE SKIN 1/2X4 (GAUZE/BANDAGES/DRESSINGS) ×2 IMPLANT
SUT ETHIBOND NAB CT1 #1 30IN (SUTURE) ×1 IMPLANT
SUT MNCRL AB 4-0 PS2 18 (SUTURE) IMPLANT
SUT VIC AB 0 CT1 27 (SUTURE) ×2
SUT VIC AB 0 CT1 27XBRD ANBCTR (SUTURE) ×1 IMPLANT
SUT VIC AB 1 CT1 27 (SUTURE) ×2
SUT VIC AB 1 CT1 27XBRD ANBCTR (SUTURE) ×1 IMPLANT
SUT VIC AB 2-0 CT1 27 (SUTURE) ×1
SUT VIC AB 2-0 CT1 TAPERPNT 27 (SUTURE) ×1 IMPLANT
TOWEL GREEN STERILE (TOWEL DISPOSABLE) ×1 IMPLANT
TOWEL GREEN STERILE FF (TOWEL DISPOSABLE) ×1 IMPLANT
TRAY CATH INTERMITTENT SS 16FR (CATHETERS) IMPLANT
TRAY FOLEY W/BAG SLVR 16FR (SET/KITS/TRAYS/PACK) ×1
TRAY FOLEY W/BAG SLVR 16FR ST (SET/KITS/TRAYS/PACK) IMPLANT
WATER STERILE IRR 1000ML POUR (IV SOLUTION) ×2 IMPLANT

## 2022-10-20 NOTE — Evaluation (Signed)
Physical Therapy Evaluation Patient Details Name: Terri Mckee MRN: [id number] DOB: 05/26/1940 Today's Date: 10/20/2022  History of Present Illness  82 y.o. female presents to [facility] hospital on 10/20/2022 for elective R THA. PMH includes HLD, HTN, GERD.  Clinical Impression  Pt presents to PT with deficits in functional mobility, gait, balance, strength, power, endurance. Pt currently requires physical assistance to perform bed mobility due to LE and core weakness. Pt's tolerance for ambulation is limited by R hip pain. PT provides education on surgical hip exercise packet. PT encourages frequent mobilization in an effort to improve mobility quality and restore independence.        If plan is discharge home, recommend the following: A little help with walking and/or transfers;A lot of help with bathing/dressing/bathroom;Assistance with cooking/housework;Assist for transportation;Help with stairs or ramp for entrance   Can travel by private vehicle        Equipment Recommendations Rolling walker (2 wheels);BSC/3in1  Recommendations for Other Services       Functional Status Assessment Patient has had a recent decline in their functional status and demonstrates the ability to make significant improvements in function in a reasonable and predictable amount of time.     Precautions / Restrictions Precautions Precautions: Fall Precaution Comments: direct anterior THA Restrictions Weight Bearing Restrictions: Yes RLE Weight Bearing: Weight bearing as tolerated      Mobility  Bed Mobility Overal bed mobility: Needs Assistance Bed Mobility: Supine to Sit, Sit to Supine     Supine to sit: Mod assist, HOB elevated Sit to supine: Min assist        Transfers Overall transfer level: Needs assistance Equipment used: Rolling walker (2 wheels) Transfers: Sit to/from Stand Sit to Stand: Contact guard assist, From elevated surface           General transfer comment:  verbal cues for hand placement and increased trunk flexion    Ambulation/Gait Ambulation/Gait assistance: Contact guard assist Gait Distance (Feet): 15 Feet Assistive device: Rolling walker (2 wheels) Gait Pattern/deviations: Step-to pattern Gait velocity: reduced Gait velocity interpretation: <1.31 ft/sec, indicative of household ambulator   General Gait Details: slowed step-to gait, antalgic gait on RLE  Stairs            Wheelchair Mobility     Tilt Bed    Modified Rankin (Stroke Patients Only)       Balance Overall balance assessment: Needs assistance Sitting-balance support: No upper extremity supported, Feet supported Sitting balance-Leahy Scale: Fair     Standing balance support: Bilateral upper extremity supported, Reliant on assistive device for balance Standing balance-Leahy Scale: Poor                               Pertinent Vitals/Pain Pain Assessment Pain Assessment: 0-10 Pain Score: 3  Pain Location: R hip Pain Descriptors / Indicators: Sore Pain Intervention(s): Monitored during session    Home Living Family/patient expects to be discharged to:: Private residence Living Arrangements: Spouse/significant other;Children Available Help at Discharge: Family;Available PRN/intermittently Type of Home: House Home Access: Stairs to enter Entrance Stairs-Rails: Can reach both Entrance Stairs-Number of Steps: 4   Home Layout: One level Home Equipment: Rollator (4 wheels);Cane - single point;BSC/3in1;Shower seat      Prior Function Prior Level of Function : Independent/Modified Independent             Mobility Comments: ambulating with rollator most recently due to hip pain and balance impairments  Extremity/Trunk Assessment   Upper Extremity Assessment Upper Extremity Assessment: Overall WFL for tasks assessed    Lower Extremity Assessment Lower Extremity Assessment: Generalized weakness    Cervical / Trunk  Assessment Cervical / Trunk Assessment: Other exceptions Cervical / Trunk Exceptions: body habitus  Communication   Communication Communication: No apparent difficulties Cueing Techniques: Verbal cues  Cognition Arousal: Alert Behavior During Therapy: WFL for tasks assessed/performed Overall Cognitive Status: Within Functional Limits for tasks assessed                                          General Comments General comments (skin integrity, edema, etc.): VSS on RA    Exercises Other Exercises Other Exercises: PT provides education on surgical hip exercise packet   Assessment/Plan    PT Assessment Patient needs continued PT services  PT Problem List Decreased strength;Decreased balance;Decreased activity tolerance;Decreased mobility;Decreased knowledge of use of DME;Pain       PT Treatment Interventions DME instruction;Gait training;Stair training;Functional mobility training;Therapeutic exercise;Therapeutic activities;Neuromuscular re-education;Balance training;Patient/family education    PT Goals (Current goals can be found in the Care Plan section)  Acute Rehab PT Goals Patient Stated Goal: to return to independence PT Goal Formulation: With patient Time For Goal Achievement: 10/24/22 Potential to Achieve Goals: Good    Frequency Min 1X/week     Co-evaluation               AM-PAC PT "6 Clicks" Mobility  Outcome Measure Help needed turning from your back to your side while in a flat bed without using bedrails?: A Little Help needed moving from lying on your back to sitting on the side of a flat bed without using bedrails?: A Lot Help needed moving to and from a bed to a chair (including a wheelchair)?: A Little Help needed standing up from a chair using your arms (e.g., wheelchair or bedside chair)?: A Little Help needed to walk in hospital room?: A Little Help needed climbing 3-5 steps with a railing? : Total 6 Click Score: 15    End of  Session Equipment Utilized During Treatment: Gait belt Activity Tolerance: Patient tolerated treatment well Patient left: in bed;with call bell/phone within reach Nurse Communication: Mobility status PT Visit Diagnosis: Other abnormalities of gait and mobility (R26.89);Muscle weakness (generalized) (M62.81);Pain Pain - Right/Left: Right Pain - part of body: Hip    Time: 2841-3244 PT Time Calculation (min) (ACUTE ONLY): 25 min   Charges:   PT Evaluation $PT Eval Low Complexity: 1 Low   PT General Charges $$ ACUTE PT VISIT: 1 Visit         Arlyss Gandy, PT, DPT Acute Rehabilitation Office [phone]   Arlyss Gandy 10/20/2022, 4:50 PM

## 2022-10-20 NOTE — Interval H&P Note (Signed)
History and Physical Interval Note: Patient presents today for right total hip replacement to treat her severe right hip arthritis.  There is any acute or interval change in her medical status.  The risks and benefits of surgery were discussed in detail and informed consent has been obtained.  The right operative hip has been marked.  10/20/2022 9:17 AM  Terri Mckee  has presented today for surgery, with the diagnosis of osteoarthritis right hip.  The various methods of treatment have been discussed with the patient and family. After consideration of risks, benefits and other options for treatment, the patient has consented to  Procedure(s): RIGHT TOTAL HIP ARTHROPLASTY ANTERIOR APPROACH (Right) as a surgical intervention.  The patient's history has been reviewed, patient examined, no change in status, stable for surgery.  I have reviewed the patient's chart and labs.  Questions were answered to the patient's satisfaction.     Kathryne Hitch

## 2022-10-20 NOTE — Op Note (Signed)
Operative Note  Date of operation: 10/20/2022 Preoperative diagnosis: Right hip primary osteoarthritis Postoperative diagnosis: Same  Procedure: Right direct anterior total hip arthroplasty  Implants: Implant Name Type Inv. Item Serial No. Manufacturer Lot No. LRB No. Used Action  PIN SECTOR W/GRIP ACE CUP - [id number] Hips PIN SECTOR W/GRIP ACE CUP  [employer] [id number] Right 1 Implanted  LINER NEUTRAL 52X36MM PLUS 4 - [id number] Liner LINER NEUTRAL [id number] PLUS 4  DEPU[employer]TH[employer] [id number] Right 1 Implanted  STEM CORAIL KA12 - [id number] Stem STEM CORAIL KA12  DEPU[employer] [id number] Right 1 Implanted  SROM M HEAD PLUS 1.5 - [id number] Hips SROM M HEAD PLUS 1.5  DEPU[employer]TH[employer] [id number] Right 1 Implanted   Surgeon: Vanita Panda. Magnus Ivan, MD Assistant Surgeon: Wylene Men MD  Assistant: Rexene Edison, PA-C  Anesthesia: #1 attempted spinal, #2 General Antibiotics: 2 g IV Ancef EBL: 150 cc Complications: None  Indications: The patient is an 82 year old female with debilitating arthritis involving her right hip.  She has arthritis of her left hip but is asymptomatic.  At this point her right hip pain is daily and it is detrimentally affecting her mobility, her quality of life and her actives daily living to the point she wished to proceed with a hip replacement.  She has tried and failed conservative treatment for a year now.  Her right hip x-rays show bone-on-bone wear.  Her hip is very painful throughout its arc of motion.  We agree with proceeding with hip replacement surgery.  We discussed the risk of acute blood loss anemia, nerve vessel injury, fracture, infection, DVT, dislocation, implant failure, learning differences and wound healing issues.  She understands that our goals are hopefully decrease pain, improve mobility, and improve quality of life.  Procedure description: After informed consent was obtained and the appropriate right hip was marked,  the patient was brought to the operating room and set up on the stretcher where spinal anesthesia was attempted was not successful.  She was laid in the spine position on the stretcher and general anesthesia was obtained.  Traction boots were placed on both her feet and she was placed supine on the Hana fracture table with a perineal post in place in both legs and inline skeletal traction devices with no traction applied.  Her right operative hip and pelvis were assessed radiographically.  The right hip was prepped and draped with DuraPrep and sterile drapes.  A timeout was called and she was identified as correct patient and correct right hip.  An incision was then made just inferior and posterior to the ASIS and carried slightly obliquely down the leg.  Dissection was carried down to the tensor fascia lata muscle and the tensor fascia was divided longitudinally to proceed with a direct interposed the hip.  Circumflex vessels were identified and cauterized.  The hip capsule identified and opened up in L-type format finding a moderate joint effusion.  Cobra retractors were placed around the medial and lateral femoral neck and a femoral neck cut was made with an oscillating saw just proximal to the lesser trochanter.  This cut was completed with an osteotome.  A corkscrew guide was placed in femoral head and the femoral head was removed its entirety and found a wide area devoid of cartilage.  A bent Hohmann was then placed over the medial acetabular rim and remnants of the acetabular labrum and other debris removed.  Reaming was then initiated from a size 43 reamer and stepwise increments, up  to a size 51 reamer with all reamers placed under direct visualization and the last reamer placed under direct fluoroscopy in order to obtain the depth of reaming, the inclination and the anteversion.  The real DePuy sector GRIPTION acetabular component size 52 was placed without difficulty followed by 36+4 polyethylene liner.   Attention was then turned to the knee.  With the right leg externally rotated to 120 degrees, extended and adducted, a Mueller retractor was placed medially by the calcar and a long bent Hohmann was placed on the greater trochanter.  The lateral joint capsule was released and a box cutting osteotome was used to enter the femoral canal.  Broaching was then initiated from a size 8 broach using theCorail broaching system going up to a size 12.  With size 12 and placed a trial standard offset femoral neck and a 36+1.5 trial hip ball.  This was reduced in the pelvis and was very tight but we felt like this was better for her for leg length and offset based on her obesity.  We decided to trial components.  We then placed the real femoral component size 12 femoral component with standard offset and the real 36+1.5 metal head ball.  Again this was reduced and acetabulum and we did increase her regimen.  We are pleased with offset and range of motion as well as stability.  The soft tissue was irrigated with normal saline solution.  The joint capsule was closed with interrupted #1 Ethibond suture followed by normal Vicryl to the tensor fascia.  0 Vicryl was used for the deep tissue and 2-0 Vicryl was used to close subcutaneous tissue.  The skin was closed with staples.  An Aquacel dressing was applied.  Patient was awakened, extubated and taken recovery in stable condition.  Rexene Edison, PA-C did assist during entire case and beginning him with assistance was crucial and medically necessary for soft tissue management and retraction, helping guide implant placement and then later closure of the wound.

## 2022-10-20 NOTE — Plan of Care (Signed)
  Problem: Pain Management: Goal: Pain level will decrease with appropriate interventions Outcome: Progressing   Problem: Skin Integrity: Goal: Will show signs of wound healing Outcome: Progressing   Problem: Education: Goal: Knowledge of General Education information will improve Description: Including pain rating scale, medication(s)/side effects and non-pharmacologic comfort measures Outcome: Progressing

## 2022-10-20 NOTE — Discharge Instructions (Signed)

## 2022-10-20 NOTE — Transfer of Care (Signed)
Immediate Anesthesia Transfer of Care Note  Patient: Terri Mckee  Procedure(s) Performed: RIGHT TOTAL HIP ARTHROPLASTY ANTERIOR APPROACH (Right: Hip)  Patient Location: PACU  Anesthesia Type:General  Level of Consciousness: awake, alert , and oriented  Airway & Oxygen Therapy: Patient Spontanous Breathing  Post-op Assessment: Post -op Vital signs reviewed and stable  Post vital signs: stable  Last Vitals:  Vitals Value Taken Time  BP 142/78 10/20/22 1218  Temp    Pulse 69 10/20/22 1219  Resp 16 10/20/22 1219  SpO2 99 % 10/20/22 1219  Vitals shown include unfiled device data.  Last Pain:  Vitals:   10/20/22 0834  TempSrc:   PainSc: 0-No pain         Complications: No notable events documented.

## 2022-10-20 NOTE — Anesthesia Postprocedure Evaluation (Signed)
Anesthesia Post Note  Patient: Terri Mckee  Procedure(s) Performed: RIGHT TOTAL HIP ARTHROPLASTY ANTERIOR APPROACH (Right: Hip)     Patient location during evaluation: PACU Anesthesia Type: Spinal and General Level of consciousness: awake and alert Pain management: pain level controlled Vital Signs Assessment: post-procedure vital signs reviewed and stable Respiratory status: spontaneous breathing, nonlabored ventilation, respiratory function stable and patient connected to nasal cannula oxygen Cardiovascular status: blood pressure returned to baseline and stable Postop Assessment: no apparent nausea or vomiting Anesthetic complications: no   No notable events documented.  Last Vitals:  Vitals:   10/20/22 1330 10/20/22 1400  BP: (!) 153/80 (!) 157/72  Pulse: 62 63  Resp: 13 19  Temp:    SpO2: 100% 100%    Last Pain:  Vitals:   10/20/22 1315  TempSrc:   PainSc: Asleep                 Collene Schlichter

## 2022-10-20 NOTE — Anesthesia Procedure Notes (Signed)
Spinal  Patient location during procedure: OR Start time: 10/20/2022 10:21 AM End time: 10/20/2022 10:31 AM Reason for block: surgical anesthesia Staffing Performed: anesthesiologist  Anesthesiologist: Collene Schlichter, MD Performed by: Collene Schlichter, MD Authorized by: Collene Schlichter, MD   Preanesthetic Checklist Completed: patient identified, IV checked, risks and benefits discussed, surgical consent, monitors and equipment checked, pre-op evaluation and timeout performed Spinal Block Patient position: sitting Prep: DuraPrep and site prepped and draped Patient monitoring: continuous pulse ox and blood pressure Approach: midline Location: L3-4 Injection technique: single-shot Needle Needle type: Whitacre  Needle gauge: 22 G Assessment Events: CSF return Additional Notes Functioning IV was confirmed and monitors were applied. Sterile prep and drape, including hand hygiene, mask and sterile gloves were used. The patient was positioned and the spine was prepped. The skin was anesthetized with lidocaine.  Free flow of clear CSF was obtained prior to injecting local anesthetic into the CSF.  The spinal needle aspirated freely following injection.  The needle was carefully withdrawn.  The patient tolerated the procedure well. Consent was obtained prior to procedure with all questions answered and concerns addressed. Risks including but not limited to bleeding, infection, nerve damage, paralysis, failed block, inadequate analgesia, allergic reaction, high spinal, itching and headache were discussed and the patient wished to proceed.    Arrie Aran, MD

## 2022-10-20 NOTE — Anesthesia Procedure Notes (Signed)
Procedure Name: Intubation Date/Time: 10/20/2022 10:43 AM  Performed by: Thomasene Ripple, CRNAPre-anesthesia Checklist: Patient identified, Emergency Drugs available, Suction available and Patient being monitored Patient Re-evaluated:Patient Re-evaluated prior to induction Oxygen Delivery Method: Circle System Utilized Preoxygenation: Pre-oxygenation with 100% oxygen Induction Type: IV induction Ventilation: Mask ventilation without difficulty Laryngoscope Size: Miller and 3 Grade View: Grade I Tube type: Oral Tube size: 7.0 mm Number of attempts: 1 Airway Equipment and Method: Stylet and Oral airway Placement Confirmation: ETT inserted through vocal cords under direct vision, positive ETCO2 and breath sounds checked- equal and bilateral Tube secured with: Tape Dental Injury: Teeth and Oropharynx as per pre-operative assessment

## 2022-10-20 NOTE — Anesthesia Preprocedure Evaluation (Signed)
Anesthesia Evaluation  Patient identified by MRN, date of birth, ID band Patient awake    Reviewed: Allergy & Precautions, NPO status , Patient's Chart, lab work & pertinent test results  Airway Mallampati: II  TM Distance: >3 FB Neck ROM: Full    Dental  (+) Teeth Intact, Dental Advisory Given   Pulmonary former smoker   Pulmonary exam normal breath sounds clear to auscultation       Cardiovascular hypertension, Pt. on medications Normal cardiovascular exam Rhythm:Regular Rate:Normal     Neuro/Psych  Neuromuscular disease    GI/Hepatic Neg liver ROS,GERD  Medicated,,  Endo/Other  negative endocrine ROS    Renal/GU negative Renal ROS Bladder dysfunction      Musculoskeletal  (+) Arthritis , Osteoarthritis,    Abdominal   Peds  Hematology negative hematology ROS (+)   Anesthesia Other Findings Day of surgery medications reviewed with the patient.  Reproductive/Obstetrics                             Anesthesia Physical Anesthesia Plan  ASA: 3  Anesthesia Plan: Spinal   Post-op Pain Management: Tylenol PO (pre-op)*   Induction: Intravenous  PONV Risk Score and Plan: 2 and TIVA, Dexamethasone and Ondansetron  Airway Management Planned: Natural Airway and Simple Face Mask  Additional Equipment:   Intra-op Plan:   Post-operative Plan:   Informed Consent: I have reviewed the patients History and Physical, chart, labs and discussed the procedure including the risks, benefits and alternatives for the proposed anesthesia with the patient or authorized representative who has indicated his/her understanding and acceptance.     Dental advisory given  Plan Discussed with: CRNA  Anesthesia Plan Comments:        Anesthesia Quick Evaluation

## 2022-10-21 ENCOUNTER — Encounter (HOSPITAL_COMMUNITY): Payer: Self-pay | Admitting: Orthopaedic Surgery

## 2022-10-21 LAB — BASIC METABOLIC PANEL
Anion gap: 10 (ref 5–15)
BUN: 16 mg/dL (ref 8–23)
CO2: 23 mmol/L (ref 22–32)
Calcium: 8.4 mg/dL — ABNORMAL LOW (ref 8.9–10.3)
Chloride: 105 mmol/L (ref 98–111)
Creatinine, Ser: 1.47 mg/dL — ABNORMAL HIGH (ref 0.44–1.00)
GFR, Estimated: 35 mL/min — ABNORMAL LOW (ref >60)
Glucose, Bld: 104 mg/dL — ABNORMAL HIGH (ref 70–99)
Potassium: 4.1 mmol/L (ref 3.5–5.1)
Sodium: 138 mmol/L (ref 135–145)

## 2022-10-21 LAB — CBC
HCT: 31.7 % — ABNORMAL LOW (ref 36.0–46.0)
Hemoglobin: 10.4 g/dL — ABNORMAL LOW (ref 12.0–15.0)
MCH: 29.3 pg (ref 26.0–34.0)
MCHC: 32.8 g/dL (ref 30.0–36.0)
MCV: 89.3 fL (ref 80.0–100.0)
Platelets: 175 10*3/uL (ref 150–400)
RBC: 3.55 MIL/uL — ABNORMAL LOW (ref 3.87–5.11)
RDW: 13.9 % (ref 11.5–15.5)
WBC: 8.1 10*3/uL (ref 4.0–10.5)
nRBC: 0 % (ref 0.0–0.2)

## 2022-10-21 MED ORDER — DICLOFENAC SODIUM 1 % EX GEL
4.0000 g | Freq: Three times a day (TID) | CUTANEOUS | Status: DC | PRN
  Administered 2022-10-21 – 2022-10-23 (×3): 4 g via TOPICAL
  Filled 2022-10-21: qty 100

## 2022-10-21 NOTE — Progress Notes (Signed)
Subjective: 1 Day Post-Op Procedure(s) (LRB): RIGHT TOTAL HIP ARTHROPLASTY ANTERIOR APPROACH (Right) Patient reports pain as moderate.  Eating and drinking well.   Objective: Vital signs in last 24 hours: Temp:  [97.5 F (36.4 C)-98.8 F (37.1 C)] 98.1 F (36.7 C) (10/09 0713) Pulse Rate:  [60-84] 74 (10/09 0713) Resp:  [10-19] 17 (10/09 0713) BP: (119-171)/(50-93) 131/62 (10/09 0713) SpO2:  [95 %-100 %] 95 % (10/09 0826)  Intake/Output from previous day: 10/08 0701 - 10/09 0700 In: 668.7 [I.V.:568.7; IV Piggyback:100] Out: 850 [Urine:700; Blood:150] Intake/Output this shift: No intake/output data recorded.  Recent Labs    10/21/22 0614  HGB 10.4*   Recent Labs    10/21/22 0614  WBC 8.1  RBC 3.55*  HCT 31.7*  PLT 175   Recent Labs    10/21/22 0614  NA 138  K 4.1  CL 105  CO2 23  BUN 16  CREATININE 1.47*  GLUCOSE 104*  CALCIUM 8.4*   No results for input(s): "LABPT", "INR" in the last 72 hours.  Sensation intact distally Dorsiflexion/Plantar flexion intact Incision: dressing C/D/I Compartment soft   Assessment/Plan: 1 Day Post-Op Procedure(s) (LRB): RIGHT TOTAL HIP ARTHROPLASTY ANTERIOR APPROACH (Right) Up with therapy Dispo pending progress with PT    Shelise Maron 10/21/2022, 8:42 AM

## 2022-10-21 NOTE — Progress Notes (Signed)
Physical Therapy Treatment Patient Details Name: Terri Mckee MRN: [id number] DOB: [birth year] Today's Date: 10/21/2022   History of Present Illness 82 y.o. female presents to [facility] hospital on 10/20/2022 for elective R THA. PMH includes HLD, HTN, GERD.    PT Comments  Patient with limited tolerance in standing and with bed mobility due to pain in R Hip and arm fatigue standing and walking.  Could not tolerate more walking right after resting her arms due to pain.  RN aware to premedicate prior to after noon session.  PT will follow up.     If plan is discharge home, recommend the following: A little help with walking and/or transfers;A lot of help with bathing/dressing/bathroom;Assistance with cooking/housework;Assist for transportation;Help with stairs or ramp for entrance   Can travel by private vehicle        Equipment Recommendations  Rolling walker (2 wheels);BSC/3in1    Recommendations for Other Services       Precautions / Restrictions Precautions Precautions: Fall Precaution Comments: direct anterior THA Restrictions RLE Weight Bearing: Weight bearing as tolerated     Mobility  Bed Mobility Overal bed mobility: Needs Assistance Bed Mobility: Supine to Sit     Supine to sit: HOB elevated, Min assist     General bed mobility comments: cues for technique, increased time, able to lift trunk and scoot with effort, assist for R LE    Transfers Overall transfer level: Needs assistance Equipment used: Rolling walker (2 wheels) Transfers: Sit to/from Stand Sit to Stand: Min assist           General transfer comment: help for balance and cues for hand placement    Ambulation/Gait   Gait Distance (Feet): 12 Feet Assistive device: Rolling walker (2 wheels) Gait Pattern/deviations: Step-to pattern, Decreased stride length, Antalgic, Trunk flexed, Wide base of support       General Gait Details: cues for sequencing, limited with fatigue and  pain   Stairs             Wheelchair Mobility     Tilt Bed    Modified Rankin (Stroke Patients Only)       Balance Overall balance assessment: Needs assistance Sitting-balance support: Feet supported Sitting balance-Leahy Scale: Poor Sitting balance - Comments: leaning back on hands   Standing balance support: Bilateral upper extremity supported, Reliant on assistive device for balance Standing balance-Leahy Scale: Poor                              Cognition Arousal: Alert Behavior During Therapy: WFL for tasks assessed/performed, Flat affect Overall Cognitive Status: Within Functional Limits for tasks assessed                                          Exercises      General Comments General comments (skin integrity, edema, etc.): RN aware plans to see after noon and premedicate      Pertinent Vitals/Pain Pain Assessment Pain Assessment: Faces Faces Pain Scale: Hurts whole lot Pain Location: R hip with mobility Pain Descriptors / Indicators: Discomfort, Sore, Guarding, Grimacing Pain Intervention(s): Repositioned, Monitored during session, Limited activity within patient's tolerance, Patient requesting pain meds-RN notified    Home Living  Prior Function            PT Goals (current goals can now be found in the care plan section) Progress towards PT goals: Not progressing toward goals - comment    Frequency    7X/week      PT Plan      Co-evaluation              AM-PAC PT "6 Clicks" Mobility   Outcome Measure  Help needed turning from your back to your side while in a flat bed without using bedrails?: A Little Help needed moving from lying on your back to sitting on the side of a flat bed without using bedrails?: A Lot Help needed moving to and from a bed to a chair (including a wheelchair)?: A Little Help needed standing up from a chair using your arms (e.g.,  wheelchair or bedside chair)?: A Little Help needed to walk in hospital room?: A Little Help needed climbing 3-5 steps with a railing? : Total 6 Click Score: 15    End of Session Equipment Utilized During Treatment: Gait belt Activity Tolerance: Patient limited by fatigue Patient left: in chair;with call bell/phone within reach   PT Visit Diagnosis: Other abnormalities of gait and mobility (R26.89);Muscle weakness (generalized) (M62.81);Pain;Difficulty in walking, not elsewhere classified (R26.2) Pain - Right/Left: Right Pain - part of body: Hip     Time: 6295-2841 PT Time Calculation (min) (ACUTE ONLY): 20 min  Charges:    $Gait Training: 8-22 mins PT General Charges $$ ACUTE PT VISIT: 1 Visit                     Sheran Lawless, PT Acute Rehabilitation Services Office:[phone] 10/21/2022    [name] 10/21/2022, 11:57 AM

## 2022-10-21 NOTE — Plan of Care (Signed)
  Problem: Pain Management: Goal: Pain level will decrease with appropriate interventions Outcome: Progressing   Problem: Skin Integrity: Goal: Will show signs of wound healing Outcome: Progressing   Problem: Clinical Measurements: Goal: Will remain free from infection Outcome: Progressing   Problem: Nutrition: Goal: Adequate nutrition will be maintained Outcome: Progressing   Problem: Pain Managment: Goal: General experience of comfort will improve Outcome: Progressing   Problem: Safety: Goal: Ability to remain free from injury will improve Outcome: Progressing   Problem: Skin Integrity: Goal: Risk for impaired skin integrity will decrease Outcome: Progressing

## 2022-10-21 NOTE — Telephone Encounter (Signed)
Encounter Error. Please disregard.

## 2022-10-21 NOTE — Progress Notes (Signed)
    Durable Medical Equipment  (From admission, onward)           Start     Ordered   10/20/22 1533  DME 3 n 1  Once        10/20/22 1533   10/20/22 1533  DME Walker rolling  Once       Question Answer Comment  Walker: With 5 Inch Wheels   Patient needs a walker to treat with the following condition Status post total replacement of right hip      10/20/22 1533

## 2022-10-21 NOTE — Care Management Obs Status (Signed)
MEDICARE OBSERVATION STATUS NOTIFICATION   Patient Details  Name: Terri Mckee MRN: [id number] Date of Birth: 30-Oct-1940   Medicare Observation Status Notification Given:  Yes    Lawerance Sabal, RN 10/21/2022, 8:28 AM

## 2022-10-21 NOTE — Progress Notes (Signed)
Physical Therapy Treatment Patient Details Name: Terri Mckee MRN: [id number] DOB: 01-Aug-1940 Today's Date: 10/21/2022   History of Present Illness 82 y.o. female presents to [facility] hospital on 10/20/2022 for elective R THA. PMH includes HLD, HTN, GERD.    PT Comments  Patient still reporting too much pain to ambulate despite RN medicating about 1 hour prior to session.  She needed increased help for sit to stand from low recliner and after sitting almost 3 hours.  She reports hopeful to do better tomorrow.  May need to consider further rehab prior to d/c if unable to ambulate tomorrow.  PT will continue to follow   If plan is discharge home, recommend the following: A little help with walking and/or transfers;A lot of help with bathing/dressing/bathroom;Assistance with cooking/housework;Assist for transportation;Help with stairs or ramp for entrance   Can travel by private vehicle        Equipment Recommendations  Rolling walker (2 wheels);BSC/3in1    Recommendations for Other Services       Precautions / Restrictions Precautions Precautions: Fall Precaution Comments: direct anterior THA Restrictions RLE Weight Bearing: Weight bearing as tolerated     Mobility  Bed Mobility Overal bed mobility: Needs Assistance Bed Mobility: Supine to Sit     Supine to sit: HOB elevated, Min assist Sit to supine: Min assist   General bed mobility comments: assist for R LE into bed and cues for repositioning    Transfers Overall transfer level: Needs assistance Equipment used: Rolling walker (2 wheels) Transfers: Sit to/from Stand, Bed to chair/wheelchair/BSC Sit to Stand: Mod assist   Step pivot transfers: Min assist       General transfer comment: heavy lifting help from recliner with cues for hand placement, assist for anterior weight shift; increased time and cues for sequencing stepping from recliner to bed    Ambulation/Gait   Gait Distance (Feet): 12  Feet Assistive device: Rolling walker (2 wheels) Gait Pattern/deviations: Step-to pattern, Decreased stride length, Antalgic, Trunk flexed, Wide base of support       General Gait Details: patient refused ambulation stating in too much pain despite medication given prior   Stairs             Wheelchair Mobility     Tilt Bed    Modified Rankin (Stroke Patients Only)       Balance Overall balance assessment: Needs assistance Sitting-balance support: Feet supported Sitting balance-Leahy Scale: Fair Sitting balance - Comments: leaning back on hands   Standing balance support: Bilateral upper extremity supported, Reliant on assistive device for balance Standing balance-Leahy Scale: Poor                              Cognition Arousal: Alert Behavior During Therapy: WFL for tasks assessed/performed Overall Cognitive Status: Within Functional Limits for tasks assessed                                          Exercises      General Comments General comments (skin integrity, edema, etc.): Educated on need for increased ambulation for going home, but pt reports she just can't      Pertinent Vitals/Pain Pain Assessment Pain Assessment: Faces Faces Pain Scale: Hurts whole lot Pain Location: R hip with mobility Pain Descriptors / Indicators: Discomfort, Sore, Guarding, Grimacing Pain Intervention(s): Monitored during  session, Repositioned, Premedicated before session    Home Living                          Prior Function            PT Goals (current goals can now be found in the care plan section) Progress towards PT goals: Not progressing toward goals - comment    Frequency    7X/week      PT Plan      Co-evaluation              AM-PAC PT "6 Clicks" Mobility   Outcome Measure  Help needed turning from your back to your side while in a flat bed without using bedrails?: A Little Help needed moving from  lying on your back to sitting on the side of a flat bed without using bedrails?: A Little Help needed moving to and from a bed to a chair (including a wheelchair)?: A Little Help needed standing up from a chair using your arms (e.g., wheelchair or bedside chair)?: A Lot Help needed to walk in hospital room?: Total Help needed climbing 3-5 steps with a railing? : Total 6 Click Score: 13    End of Session Equipment Utilized During Treatment: Gait belt Activity Tolerance: Patient limited by pain Patient left: in bed;with call bell/phone within reach   PT Visit Diagnosis: Other abnormalities of gait and mobility (R26.89);Muscle weakness (generalized) (M62.81);Pain;Difficulty in walking, not elsewhere classified (R26.2) Pain - Right/Left: Right Pain - part of body: Hip     Time: 1329-1340 PT Time Calculation (min) (ACUTE ONLY): 11 min  Charges:    $Therapeutic Activity: 8-22 mins PT General Charges $$ ACUTE PT VISIT: 1 Visit                     Terri Mckee, PT Acute Rehabilitation Services Office:[phone] 10/21/2022    Terri Mckee 10/21/2022, 4:56 PM

## 2022-10-22 DIAGNOSIS — E739 Lactose intolerance, unspecified: Secondary | ICD-10-CM | POA: Diagnosis present

## 2022-10-22 DIAGNOSIS — M1612 Unilateral primary osteoarthritis, left hip: Secondary | ICD-10-CM | POA: Diagnosis present

## 2022-10-22 DIAGNOSIS — Z811 Family history of alcohol abuse and dependence: Secondary | ICD-10-CM | POA: Diagnosis not present

## 2022-10-22 DIAGNOSIS — E88819 Insulin resistance, unspecified: Secondary | ICD-10-CM | POA: Diagnosis present

## 2022-10-22 DIAGNOSIS — Z860100 Personal history of colon polyps, unspecified: Secondary | ICD-10-CM | POA: Diagnosis not present

## 2022-10-22 DIAGNOSIS — Q631 Lobulated, fused and horseshoe kidney: Secondary | ICD-10-CM | POA: Diagnosis not present

## 2022-10-22 DIAGNOSIS — Z808 Family history of malignant neoplasm of other organs or systems: Secondary | ICD-10-CM | POA: Diagnosis not present

## 2022-10-22 DIAGNOSIS — I10 Essential (primary) hypertension: Secondary | ICD-10-CM | POA: Diagnosis present

## 2022-10-22 DIAGNOSIS — Z96641 Presence of right artificial hip joint: Secondary | ICD-10-CM | POA: Diagnosis present

## 2022-10-22 DIAGNOSIS — E782 Mixed hyperlipidemia: Secondary | ICD-10-CM | POA: Diagnosis present

## 2022-10-22 DIAGNOSIS — Z87891 Personal history of nicotine dependence: Secondary | ICD-10-CM | POA: Diagnosis not present

## 2022-10-22 DIAGNOSIS — Z9889 Other specified postprocedural states: Secondary | ICD-10-CM | POA: Diagnosis not present

## 2022-10-22 DIAGNOSIS — Z87448 Personal history of other diseases of urinary system: Secondary | ICD-10-CM | POA: Diagnosis not present

## 2022-10-22 DIAGNOSIS — Z862 Personal history of diseases of the blood and blood-forming organs and certain disorders involving the immune mechanism: Secondary | ICD-10-CM | POA: Diagnosis not present

## 2022-10-22 DIAGNOSIS — H9190 Unspecified hearing loss, unspecified ear: Secondary | ICD-10-CM | POA: Diagnosis present

## 2022-10-22 DIAGNOSIS — E669 Obesity, unspecified: Secondary | ICD-10-CM | POA: Diagnosis present

## 2022-10-22 DIAGNOSIS — E559 Vitamin D deficiency, unspecified: Secondary | ICD-10-CM | POA: Diagnosis present

## 2022-10-22 DIAGNOSIS — M1611 Unilateral primary osteoarthritis, right hip: Secondary | ICD-10-CM | POA: Diagnosis present

## 2022-10-22 DIAGNOSIS — Z833 Family history of diabetes mellitus: Secondary | ICD-10-CM | POA: Diagnosis not present

## 2022-10-22 DIAGNOSIS — Z8739 Personal history of other diseases of the musculoskeletal system and connective tissue: Secondary | ICD-10-CM | POA: Diagnosis not present

## 2022-10-22 DIAGNOSIS — Z832 Family history of diseases of the blood and blood-forming organs and certain disorders involving the immune mechanism: Secondary | ICD-10-CM | POA: Diagnosis not present

## 2022-10-22 DIAGNOSIS — G4700 Insomnia, unspecified: Secondary | ICD-10-CM | POA: Diagnosis present

## 2022-10-22 DIAGNOSIS — M8580 Other specified disorders of bone density and structure, unspecified site: Secondary | ICD-10-CM | POA: Diagnosis present

## 2022-10-22 DIAGNOSIS — K219 Gastro-esophageal reflux disease without esophagitis: Secondary | ICD-10-CM | POA: Diagnosis present

## 2022-10-22 DIAGNOSIS — Z9049 Acquired absence of other specified parts of digestive tract: Secondary | ICD-10-CM | POA: Diagnosis not present

## 2022-10-22 NOTE — NC FL2 (Signed)
[place] MEDICAID FL2 LEVEL OF CARE FORM     IDENTIFICATION  Patient Name: Terri Mckee Birthdate: February 25, 1940 Sex: female Admission Date (Current Location): 10/20/2022  [facility] and [place] Number:  Producer, television/film/video and Address:  [facility]. [facility], [address] [place], [place] [address]      Provider Number: [id number]  Attending Physician Name and Address:  Kathryne Hitch,*  Relative Name and Phone Number:  Nat Christen [phone]    Current Level of Care: Hospital Recommended Level of Care: Skilled Nursing Facility Prior Approval Number:    Date Approved/Denied:   PASRR Number: [id number]  Discharge Plan: SNF    Current Diagnoses: Patient Active Problem List   Diagnosis Date Noted   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   Unilateral primary osteoarthritis, right hip 09/02/2022   CRP elevated 09/01/2020   Grief at loss of child 09/01/2020   Pain in left knee 04/17/2020   Muscle weakness 03/31/2020   Myalgia 03/31/2020   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   Class 1 obesity with serious comorbidity and body mass index (BMI) of 31.0 to 31.9 in adult 03/16/2018   Muscle cramp, nocturnal 06/14/2017   Preventative health care 04/05/2016   Vitamin D deficiency 04/03/2016   Cataracts, bilateral 07/25/2015   Left shoulder pain 01/29/2015   Breast cancer screening 09/02/2014   Decreased visual acuity 09/02/2014   Hearing loss 09/02/2014   Bilateral hip pain 09/02/2014   Medicare annual wellness visit, subsequent 09/02/2014   Osteopenia 08/17/2014   Right knee pain 09/24/2013   Low back pain 12/03/2012   Skin lesion 07/10/2012   Urinary incontinence 07/10/2012   Cervical cancer screening 07/07/2012   Cough 04/01/2012   Hoarseness of voice 03/13/2012   Cervical radiculopathy 12/03/2011   GERD 09/13/2009   Pain in both knees 12/21/2008   ECZEMA, 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   Hyperlipidemia, mixed  10/28/2007   INSOMNIA 10/28/2007   Allergic rhinitis 07/01/2007   Muscle cramps 03/21/2007   Essential hypertension 08/06/2006   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    Orientation RESPIRATION BLADDER Height & Weight     Self, Time, Situation  Normal Continent Weight: 223 lb (101.2 kg) Height:  5\' 9"  (175.3 cm)  BEHAVIORAL SYMPTOMS/MOOD NEUROLOGICAL BOWEL NUTRITION STATUS      Continent Diet (see discharge summary)  AMBULATORY STATUS COMMUNICATION OF NEEDS Skin   Limited Assist Verbally Surgical wounds                       Personal Care Assistance Level of Assistance  Bathing, Feeding, Dressing, Total care Bathing Assistance: Maximum assistance Feeding assistance: Limited assistance Dressing Assistance: Maximum assistance Total Care Assistance: Maximum assistance   Functional Limitations Info  Sight, Hearing, Speech Sight Info: Adequate Hearing Info: Adequate Speech Info: Adequate    SPECIAL CARE FACTORS FREQUENCY  PT (By licensed PT), OT (By licensed OT)     PT Frequency: 5x week OT Frequency: 5x week            Contractures Contractures Info: Not present    Additional Factors Info  Code Status, Allergies Code Status Info: full Allergies Info: Allegra (Fexofenadine), Haemophilus Influenzae Vaccines, Erythromycin, Oxycodone-acetaminophen, Propoxyphene N-acetaminophen, Zocor (Simvastatin)           Current Medications (10/22/2022):  This is the current hospital active medication list Current Facility-Administered Medications  Medication Dose Route Frequency Provider Last Rate Last Admin   acetaminophen (TYLENOL) tablet 325-650 mg  325-650 mg  Oral Q6H PRN Kathryne Hitch, MD       albuterol (PROVENTIL) (2.5 MG/3ML) 0.083% nebulizer solution 3 mL  3 mL Inhalation Q6H PRN Kathryne Hitch, MD       alum & mag hydroxide-simeth (MAALOX/MYLANTA) 200-200-20 MG/5ML suspension 30 mL  30 mL Oral Q4H PRN Kathryne Hitch, MD       aspirin chewable  tablet 81 mg  81 mg Oral BID Kathryne Hitch, MD   81 mg at 10/22/22 0809   diclofenac Sodium (VOLTAREN) 1 % topical gel 4 g  4 g Topical TID PRN Kathryne Hitch, MD   4 g at 10/21/22 2052   diphenhydrAMINE (BENADRYL) 12.5 MG/5ML elixir 12.5-25 mg  12.5-25 mg Oral Q4H PRN Kathryne Hitch, MD       docusate sodium (COLACE) capsule 100 mg  100 mg Oral BID Kathryne Hitch, MD   100 mg at 10/22/22 0809   famotidine (PEPCID) tablet 20 mg  20 mg Oral BID Kathryne Hitch, MD   20 mg at 10/22/22 0809   felodipine (PLENDIL) 24 hr tablet 10 mg  10 mg Oral Daily Kathryne Hitch, MD   10 mg at 10/22/22 0821   HYDROcodone-acetaminophen (NORCO) 7.5-325 MG per tablet 1-2 tablet  1-2 tablet Oral Q4H PRN Kathryne Hitch, MD   2 tablet at 10/20/22 1322   HYDROcodone-acetaminophen (NORCO/VICODIN) 5-325 MG per tablet 1-2 tablet  1-2 tablet Oral Q4H PRN Kathryne Hitch, MD   2 tablet at 10/22/22 1259   menthol-cetylpyridinium (CEPACOL) lozenge 3 mg  1 lozenge Oral PRN Kathryne Hitch, MD       Or   phenol (CHLORASEPTIC) mouth spray 1 spray  1 spray Mouth/Throat PRN Kathryne Hitch, MD       methocarbamol (ROBAXIN) tablet 500 mg  500 mg Oral Q6H PRN Kathryne Hitch, MD   500 mg at 10/20/22 1322   Or   methocarbamol (ROBAXIN) 500 mg in dextrose 5 % 50 mL IVPB  500 mg Intravenous Q6H PRN Kathryne Hitch, MD       metoCLOPramide (REGLAN) tablet 5-10 mg  5-10 mg Oral Q8H PRN Kathryne Hitch, MD       Or   metoCLOPramide (REGLAN) injection 5-10 mg  5-10 mg Intravenous Q8H PRN Kathryne Hitch, MD       mometasone-formoterol [facility]) 200-5 MCG/ACT inhaler 2 puff  2 puff Inhalation BID Kathryne Hitch, MD   2 puff at 10/22/22 0811   morphine (PF) 2 MG/ML injection 0.5-1 mg  0.5-1 mg Intravenous Q2H PRN Kathryne Hitch, MD       ondansetron [facility]) tablet 4 mg  4 mg Oral Q6H PRN Kathryne Hitch, MD       Or   ondansetron [facility]) injection 4 mg  4 mg Intravenous Q6H PRN Kathryne Hitch, MD         Discharge Medications: Please see discharge summary for a list of discharge medications.  Relevant Imaging Results:  Relevant Lab Results:   Additional Information SSN: [id number]  Lorri Frederick, LCSW

## 2022-10-22 NOTE — Plan of Care (Signed)
  Problem: Activity: Goal: Ability to avoid complications of mobility impairment will improve Outcome: Progressing   Problem: Pain Management: Goal: Pain level will decrease with appropriate interventions Outcome: Progressing   Problem: Clinical Measurements: Goal: Cardiovascular complication will be avoided Outcome: Progressing

## 2022-10-22 NOTE — Plan of Care (Signed)
  Problem: Pain Management: Goal: Pain level will decrease with appropriate interventions Outcome: Progressing   Problem: Skin Integrity: Goal: Will show signs of wound healing Outcome: Progressing   Problem: Clinical Measurements: Goal: Will remain free from infection Outcome: Progressing   Problem: Nutrition: Goal: Adequate nutrition will be maintained Outcome: Progressing   Problem: Safety: Goal: Ability to remain free from injury will improve Outcome: Progressing   Problem: Skin Integrity: Goal: Risk for impaired skin integrity will decrease Outcome: Progressing

## 2022-10-22 NOTE — Progress Notes (Signed)
Patient ID: Terri Mckee, female   DOB: 27-Oct-1940, 82 y.o.   MRN: [id number] The patient is currently sitting in a bedside chair.  Her vital signs are stable.  Her right operative hip is stable.  Upon review of the notes from physical therapy, the patient's mobility has been significant limited.  There are no notes from therapy today.  It does not look like the patient will be able to go home today at all.  She is still needing a lot of assistance.  She has a elderly husband at home but does have other family.  I talked to her about the possibility of looking into skilled nursing but she would rather just try to be able to get home.  With that being said, I will switch her to an inpatient admission because there is no way that she can go home today.  Hopefully she will do better with her mobility today with the potential of going home sometime over the next 1 to 2 days.

## 2022-10-22 NOTE — Plan of Care (Signed)
  Problem: Activity: Goal: Ability to avoid complications of mobility impairment will improve Outcome: Progressing   Problem: Pain Management: Goal: Pain level will decrease with appropriate interventions Outcome: Progressing   Problem: Clinical Measurements: Goal: Ability to maintain clinical measurements within normal limits will improve Outcome: Progressing   

## 2022-10-22 NOTE — Progress Notes (Signed)
Patient ID: Terri Mckee, female   DOB: 04-28-1940, 82 y.o.   MRN: [id number] Physical therapy just informing that the patient is having significant difficulties with mobility.  They are recommending short-term skilled nursing placement given her safety with mobility.  I will put in a consultation for the transitional care team to start looking into this process.

## 2022-10-22 NOTE — TOC Initial Note (Addendum)
Transition of Care [facility]) - Initial/Assessment Note    Patient Details  Name: Terri Mckee MRN: [id number] Date of Birth: 03/14/1940  Transition of Care [facility] CM/SW Contact:    Lorri Frederick, LCSW Phone Number: 10/22/2022, 2:29 PM  Clinical Narrative:  CSW informed by PT that pt unable to ambulate sufficiently for DC home.  CSW spoke with pt in room, discussed SNF and pt does agree.  Pt [facility] if possible.  Pt from home with husband and a disabled granddaughter.  Permission given to speak with husband Onalee Hua and another Surveyor, quantity, Cameron.  No current services.          Referral sent out in hub, CSW reached out to [place] to review.       1500: [facility] does offer, CSW spoke with pt and she does want to accept.  CSW spoke with granddaughter Margaretmary Lombard to update.  She is also in agreement with this plan and available to sign paperwork at [facility].  Unable to locate pt in Nav[employer] portal, Auth request made over the phone and clinical faxed to [phone].        Expected Discharge Plan: Skilled Nursing Facility Barriers to Discharge: Continued Medical Work up, SNF Pending bed offer   Patient Goals and CMS Choice Patient states their goals for this hospitalization and ongoing recovery are:: walk   Choice offered to / list presented to : Patient (requesting [facility])      Expected Discharge Plan and Services In-house Referral: Clinical Social Work   Post Acute Care Choice: Skilled Nursing Facility Living arrangements for the past 2 months: Single Family Home                                      Prior Living Arrangements/Services Living arrangements for the past 2 months: Single Family Home Lives with:: Spouse, Relatives (granddaughter) Patient language and need for interpreter reviewed:: Yes Do you feel safe going back to the place where you live?: Yes      Need for Family Participation in Patient Care: Yes (Comment) Care giver support  system in place?: Yes (comment) Current home services: Other (comment) (none) Criminal Activity/Legal Involvement Pertinent to Current Situation/Hospitalization: No - Comment as needed  Activities of Daily Living   ADL Screening (condition at time of admission) Independently performs ADLs?: No Does the patient have a NEW difficulty with bathing/dressing/toileting/self-feeding that is expected to last >3 days?: Yes (Initiates electronic notice to provider for possible OT consult) Does the patient have a NEW difficulty with getting in/out of bed, walking, or climbing stairs that is expected to last >3 days?: Yes (Initiates electronic notice to provider for possible PT consult) Does the patient have a NEW difficulty with communication that is expected to last >3 days?: No Is the patient deaf or have difficulty hearing?: Yes Does the patient have difficulty seeing, even when wearing glasses/contacts?: No Does the patient have difficulty concentrating, remembering, or making decisions?: No  Permission Sought/Granted Permission sought to share information with : Family Supports Permission granted to share information with : Yes, Verbal Permission Granted  Share Information with NAME: granddaughter Margaretmary Lombard  Permission granted to share info w AGENCY: SNF        Emotional Assessment Appearance:: Appears stated age Attitude/Demeanor/Rapport: Engaged Affect (typically observed): Appropriate, Pleasant Orientation: : Oriented to Self, Oriented to Place, Oriented to  Time, Oriented to Situation  Admission diagnosis:  Status post total replacement of right hip [Z96.641] Patient Active Problem List   Diagnosis Date Noted   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   Unilateral primary osteoarthritis, right hip 09/02/2022   CRP elevated 09/01/2020   Grief at loss of child 09/01/2020   Pain in left knee 04/17/2020   Muscle weakness 03/31/2020   Myalgia 03/31/2020   Insulin  resistance 03/30/2018   Class 1 obesity with serious comorbidity and body mass index (BMI) of 31.0 to 31.9 in adult 03/16/2018   Muscle cramp, nocturnal 06/14/2017   Preventative health care 04/05/2016   Vitamin D deficiency 04/03/2016   Cataracts, bilateral 07/25/2015   Left shoulder pain 01/29/2015   Breast cancer screening 09/02/2014   Decreased visual acuity 09/02/2014   Hearing loss 09/02/2014   Bilateral hip pain 09/02/2014   Medicare annual wellness visit, subsequent 09/02/2014   Osteopenia 08/17/2014   Right knee pain 09/24/2013   Low back pain 12/03/2012   Skin lesion 07/10/2012   Urinary incontinence 07/10/2012   Cervical cancer screening 07/07/2012   Cough 04/01/2012   Hoarseness of voice 03/13/2012   Cervical radiculopathy 12/03/2011   GERD 09/13/2009   Pain in both knees 12/21/2008   ECZEMA, 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   Hyperlipidemia, mixed 10/28/2007   INSOMNIA 10/28/2007   Allergic rhinitis 07/01/2007   Muscle cramps 03/21/2007   Essential hypertension 08/06/2006   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   PCP:  Bradd Canary, MD Pharmacy:   [facility] [phone] - [place], [place] - [address] AT [facility] [address] [place] [address] Phone: [phone] Fax: [phone]     Social Determinants of Health (SDOH) Social History: SDOH Screenings   Food Insecurity: No Food Insecurity (10/20/2022)  Housing: Low Risk  (10/20/2022)  Transportation Needs: No Transportation Needs (10/20/2022)  Utilities: Not At Risk (10/20/2022)  Alcohol Screen: Low Risk  (01/26/2022)  Depression (PHQ2-9): Low Risk  (09/24/2022)  Financial Resource Strain: Low Risk  (01/17/2021)  Physical Activity: Inactive (01/17/2021)  Social Connections: Moderately Integrated (01/17/2021)  Stress: No Stress Concern Present (01/17/2021)  Tobacco Use: Medium Risk (10/20/2022)   SDOH Interventions:     Readmission Risk Interventions     No data to display

## 2022-10-22 NOTE — Evaluation (Signed)
Occupational Therapy Evaluation Patient Details Name: Terri Mckee MRN: [id number] DOB: November 24, 1940 Today's Date: 10/22/2022   History of Present Illness 82 y.o. female presents to [facility] hospital on 10/20/2022 for elective R THA. PMH includes HLD, HTN, GERD.   Clinical Impression   PTA patient reports using RW for mobility and able to manage ADLs, but needing assist for IADls.  Admitted for above and presents with problem list below.  She requires mod assist for bed mobility, mod assist for transfers with max cueing for safety/ technique using RW, and setup to total assist for ADLs.  Pt remains hopeful to dc home, but does report her spouse uses a RW himself and cannot really assist her.  Will follow acutely to optimize independence and safety with ADLs and mobility, at this time would recommend inpatient setting with >3hrs/day but dc plan will be set by surgeon.        If plan is discharge home, recommend the following: A lot of help with walking and/or transfers;A lot of help with bathing/dressing/bathroom;Assist for transportation;Assistance with cooking/housework;Help with stairs or ramp for entrance    Functional Status Assessment  Patient has had a recent decline in their functional status and demonstrates the ability to make significant improvements in function in a reasonable and predictable amount of time.  Equipment Recommendations  Other (comment) (RW)    Recommendations for Other Services       Precautions / Restrictions Precautions Precautions: Fall Precaution Comments: direct anterior THA Restrictions Weight Bearing Restrictions: No RLE Weight Bearing: Weight bearing as tolerated      Mobility Bed Mobility Overal bed mobility: Needs Assistance Bed Mobility: Supine to Sit     Supine to sit: HOB elevated, Mod assist     General bed mobility comments: assist for RLE to EOB, requires increased assist to scoot forward    Transfers Overall transfer  level: Needs assistance Equipment used: Rolling walker (2 wheels) Transfers: Sit to/from Stand, Bed to chair/wheelchair/BSC Sit to Stand: Mod assist, From elevated surface     Step pivot transfers: Mod assist     General transfer comment: from elevated EOB, mod assist to fully power up with cueing to maintain upright position once standing.  She requires max cueing for technique and safety with pivoting towards L side, pt frequently stepping with R LE but not moving L LE (as difficulty to shift weight onto surgical leg) and attempting to reach for recliner for "more support" before she was close enough, as well as cueing to keep hands RW handles      Balance Overall balance assessment: Needs assistance Sitting-balance support: Feet supported Sitting balance-Leahy Scale: Fair Sitting balance - Comments: limited dynamically, but able to sit without UE support and supervision   Standing balance support: Bilateral upper extremity supported, During functional activity Standing balance-Leahy Scale: Poor Standing balance comment: relies on BUEs in standing                           ADL either performed or assessed with clinical judgement   ADL Overall ADL's : Needs assistance/impaired     Grooming: Set up;Sitting           Upper Body Dressing : Set up;Sitting   Lower Body Dressing: Total assistance;Sit to/from stand Lower Body Dressing Details (indicate cue type and reason): requires assist for socks, relies on BUE support in standing and needs total assist Toilet Transfer: Moderate assistance;Rolling walker (2 wheels) Statistician  Details (indicate cue type and reason): bed to chair         Functional mobility during ADLs: Moderate assistance;Rolling walker (2 wheels);Cueing for safety;Cueing for sequencing       Vision   Vision Assessment?: No apparent visual deficits;Wears glasses for reading     Perception         Praxis         Pertinent  Vitals/Pain Pain Assessment Pain Assessment: Faces Faces Pain Scale: Hurts even more Pain Location: R hip with mobility Pain Descriptors / Indicators: Discomfort, Sore, Guarding, Grimacing, Operative site guarding Pain Intervention(s): Limited activity within patient's tolerance, Monitored during session, Premedicated before session, Repositioned, Ice applied     Extremity/Trunk Assessment Upper Extremity Assessment Upper Extremity Assessment: Overall WFL for tasks assessed   Lower Extremity Assessment Lower Extremity Assessment: Defer to PT evaluation       Communication Communication Communication: No apparent difficulties Cueing Techniques: Verbal cues   Cognition Arousal: Alert Behavior During Therapy: WFL for tasks assessed/performed Overall Cognitive Status: Within Functional Limits for tasks assessed                                       General Comments       Exercises     Shoulder Instructions      Home Living Family/patient expects to be discharged to:: Private residence Living Arrangements: Spouse/significant other;Children (granddaughter) Available Help at Discharge: Family;Available PRN/intermittently Type of Home: House Home Access: Stairs to enter [employer] of Steps: 4 Entrance Stairs-Rails: Can reach both Home Layout: One level     Bathroom Shower/Tub: Producer, television/film/video: Standard (3:1 over toilet)     Home Equipment: Rollator (4 wheels);Cane - single point;BSC/3in1;Shower seat;Grab bars - tub/shower   Additional Comments: spouse is on a walker      Prior Functioning/Environment Prior Level of Function : Independent/Modified Independent             Mobility Comments: ambulating with rollator most recently due to hip pain and balance impairments ADLs Comments: was managing bathing/dressing, but needed assistance with IADLs, driving; pt managing her medication        OT Problem List:  Decreased strength;Decreased activity tolerance;Impaired balance (sitting and/or standing);Decreased safety awareness;Decreased knowledge of use of DME or AE;Decreased knowledge of precautions;Pain;Obesity      OT Treatment/Interventions: Self-care/ADL training;Therapeutic exercise;DME and/or AE instruction;Therapeutic activities;Balance training;Patient/family education    OT Goals(Current goals can be found in the care plan section) Acute Rehab OT Goals Patient Stated Goal: home OT Goal Formulation: With patient Time For Goal Achievement: 11/05/22 Potential to Achieve Goals: Fair  OT Frequency: Min 1X/week    Co-evaluation              AM-PAC OT "6 Clicks" Daily Activity     Outcome Measure Help from another person eating meals?: None Help from another person taking care of personal grooming?: A Little Help from another person toileting, which includes using toliet, bedpan, or urinal?: Total Help from another person bathing (including washing, rinsing, drying)?: A Lot Help from another person to put on and taking off regular upper body clothing?: A Little Help from another person to put on and taking off regular lower body clothing?: Total 6 Click Score: 14   End of Session Equipment Utilized During Treatment: Gait belt;Rolling walker (2 wheels) Nurse Communication: Mobility status  Activity Tolerance: Patient tolerated treatment  well Patient left: in chair;with call bell/phone within reach;with chair alarm set  OT Visit Diagnosis: Other abnormalities of gait and mobility (R26.89);Muscle weakness (generalized) (M62.81);Pain Pain - Right/Left: Right Pain - part of body: Hip;Leg                Time: 1610-9604 OT Time Calculation (min): 29 min Charges:  OT General Charges $OT Visit: 1 Visit OT Evaluation $OT Eval Moderate Complexity: 1 Mod OT Treatments $Self Care/Home Management : 8-22 mins  Barry Brunner, OT Acute Rehabilitation Services Office  [phone]   Terri Mckee 10/22/2022, 11:09 AM

## 2022-10-22 NOTE — Progress Notes (Signed)
Physical Therapy Treatment Patient Details Name: Terri Mckee MRN: [id number] DOB: 1940-07-11 Today's Date: 10/22/2022   History of Present Illness 82 y.o. female presents to [facility] hospital on 10/20/2022 for elective R THA. PMH includes HLD, HTN, GERD.    PT Comments  Pt with fair tolerance to treatment today. Pt continues to be very limited by pain and decreased activity tolerance. Pt able to ambulate very short distance in room before stating that she couldn't go any further. Pt states that at home she has a far walk from the street, 4 steps to enter, and an elder husband who is physically unable to assist. Pt educated that she would benefit from SNF. Pt reluctant but appeared to be agreeable. MD made aware. PT will continue to follow.    If plan is discharge home, recommend the following: A little help with walking and/or transfers;A lot of help with bathing/dressing/bathroom;Assistance with cooking/housework;Assist for transportation;Help with stairs or ramp for entrance   Can travel by private vehicle        Equipment Recommendations  Rolling walker (2 wheels);BSC/3in1    Recommendations for Other Services       Precautions / Restrictions Precautions Precautions: Fall Precaution Comments: direct anterior THA Restrictions Weight Bearing Restrictions: No RLE Weight Bearing: Weight bearing as tolerated     Mobility  Bed Mobility                    Transfers Overall transfer level: Needs assistance Equipment used: Rolling walker (2 wheels) Transfers: Sit to/from Stand Sit to Stand: Min assist           General transfer comment: Min A to power up from chair. Pt requires multiple attempts and cues for hand placement.    Ambulation/Gait Ambulation/Gait assistance: Contact guard assist Gait Distance (Feet): 10 Feet Assistive device: Rolling walker (2 wheels) Gait Pattern/deviations: Step-to pattern, Decreased stride length, Antalgic, Trunk flexed,  Wide base of support Gait velocity: reduced     General Gait Details: Pt able to ambulate about 54ft forward before stating that pain is too much requested to go back to chair.   Stairs             Wheelchair Mobility     Tilt Bed    Modified Rankin (Stroke Patients Only)       Balance Overall balance assessment: Needs assistance Sitting-balance support: Feet supported Sitting balance-Leahy Scale: Fair Sitting balance - Comments: limited dynamically, but able to sit without UE support and supervision   Standing balance support: Bilateral upper extremity supported, During functional activity Standing balance-Leahy Scale: Poor Standing balance comment: relies on BUEs in standing                            Cognition Arousal: Alert Behavior During Therapy: WFL for tasks assessed/performed Overall Cognitive Status: Within Functional Limits for tasks assessed                                          Exercises      General Comments General comments (skin integrity, edema, etc.): VSS      Pertinent Vitals/Pain Pain Assessment Pain Assessment: Faces Faces Pain Scale: Hurts even more Pain Location: R hip with mobility Pain Descriptors / Indicators: Discomfort, Sore, Guarding, Grimacing, Operative site guarding Pain Intervention(s): Limited activity within patient's tolerance, Monitored  during session, Premedicated before session, Repositioned, Ice applied    Home Living Family/patient expects to be discharged to:: Private residence Living Arrangements: Spouse/significant other;Children (granddaughter) Available Help at Discharge: Family;Available PRN/intermittently Type of Home: House Home Access: Stairs to enter Entrance Stairs-Rails: Can reach both Entrance Stairs-Number of Steps: 4   Home Layout: One level Home Equipment: Rollator (4 wheels);Cane - single point;BSC/3in1;Shower seat;Grab bars - tub/shower Additional Comments:  spouse is on a walker    Prior Function            PT Goals (current goals can now be found in the care plan section) Progress towards PT goals: Progressing toward goals    Frequency    7X/week      PT Plan      Co-evaluation              AM-PAC PT "6 Clicks" Mobility   Outcome Measure  Help needed turning from your back to your side while in a flat bed without using bedrails?: A Little Help needed moving from lying on your back to sitting on the side of a flat bed without using bedrails?: A Little Help needed moving to and from a bed to a chair (including a wheelchair)?: A Little Help needed standing up from a chair using your arms (e.g., wheelchair or bedside chair)?: A Lot Help needed to walk in hospital room?: Total Help needed climbing 3-5 steps with a railing? : Total 6 Click Score: 13    End of Session Equipment Utilized During Treatment: Gait belt Activity Tolerance: Patient limited by pain Patient left: in chair;with call bell/phone within reach Nurse Communication: Mobility status PT Visit Diagnosis: Other abnormalities of gait and mobility (R26.89);Muscle weakness (generalized) (M62.81);Pain;Difficulty in walking, not elsewhere classified (R26.2) Pain - Right/Left: Right Pain - part of body: Hip     Time: 1152-1207 PT Time Calculation (min) (ACUTE ONLY): 15 min  Charges:    $Gait Training: 8-22 mins PT General Charges $$ ACUTE PT VISIT: 1 Visit                     Shela Nevin, PT, DPT Acute Rehab Services [phone]    Terri Mckee 10/22/2022, 12:25 PM

## 2022-10-23 MED ORDER — HYDROCODONE-ACETAMINOPHEN 5-325 MG PO TABS: 1.0000 | ORAL_TABLET | Freq: Four times a day (QID) | ORAL | 0 refills | Status: DC | PRN

## 2022-10-23 MED ORDER — ASPIRIN 81 MG PO CHEW: 81.0000 mg | CHEWABLE_TABLET | Freq: Two times a day (BID) | ORAL | 0 refills | Status: AC

## 2022-10-23 NOTE — Plan of Care (Signed)
  Problem: Activity: Goal: Ability to avoid complications of mobility impairment will improve Outcome: Completed/Met Goal: Ability to tolerate increased activity will improve Outcome: Completed/Met   Problem: Clinical Measurements: Goal: Postoperative complications will be avoided or minimized Outcome: Completed/Met   Problem: Pain Management: Goal: Pain level will decrease with appropriate interventions Outcome: Completed/Met   Problem: Skin Integrity: Goal: Will show signs of wound healing Outcome: Completed/Met   Problem: Education: Goal: Knowledge of General Education information will improve Description: Including pain rating scale, medication(s)/side effects and non-pharmacologic comfort measures Outcome: Completed/Met   Problem: Health Behavior/Discharge Planning: Goal: Ability to manage health-related needs will improve Outcome: Completed/Met   Problem: Activity: Goal: Ability to avoid complications of mobility impairment will improve Outcome: Completed/Met Goal: Ability to tolerate increased activity will improve Outcome: Completed/Met   Problem: Clinical Measurements: Goal: Postoperative complications will be avoided or minimized Outcome: Completed/Met   Problem: Pain Management: Goal: Pain level will decrease with appropriate interventions Outcome: Completed/Met   Problem: Skin Integrity: Goal: Will show signs of wound healing Outcome: Completed/Met   Problem: Education: Goal: Knowledge of General Education information will improve Description: Including pain rating scale, medication(s)/side effects and non-pharmacologic comfort measures Outcome: Completed/Met   Problem: Health Behavior/Discharge Planning: Goal: Ability to manage health-related needs will improve Outcome: Completed/Met

## 2022-10-23 NOTE — Progress Notes (Signed)
Patient ID: Terri Mckee, female   DOB: 1940-11-28, 82 y.o.   MRN: [id number] The patient is awake and alert this morning.  Her vital signs are stable.  Her mobility is certainly been limited and thus therapy has recommended short-term skilled nursing.  An FL 2 has been signed.  Social work is now involved with the process of finding a bed for her.  I will check on her later today.  Her right operative hip is stable.  The dressing is clean and dry.

## 2022-10-23 NOTE — TOC Progression Note (Addendum)
Transition of Care [facility]) - Progression Note    Patient Details  Name: Terri Mckee MRN: [id number] Date of Birth: July 13, 1940  Transition of Care [facility]) CM/SW Contact  Lorri Frederick, LCSW Phone Number: 10/23/2022, 8:26 AM  Clinical Narrative:   SNF auth approved: [id number], 5 days: 10/11-10/15.    1100: TC Fatima.  She was at [facility], trying to sign paperwork for [place], CSW spoke with Quandra[facility], redirected Chad to [facility] building.     Expected Discharge Plan: Skilled Nursing Facility Barriers to Discharge: Continued Medical Work up, SNF Pending bed offer  Expected Discharge Plan and Services In-house Referral: Clinical Social Work   Post Acute Care Choice: Skilled Nursing Facility Living arrangements for the past 2 months: Single Family Home                                       Social Determinants of Health (SDOH) Interventions SDOH Screenings   Food Insecurity: No Food Insecurity (10/20/2022)  Housing: Low Risk  (10/20/2022)  Transportation Needs: No Transportation Needs (10/20/2022)  Utilities: Not At Risk (10/20/2022)  Alcohol Screen: Low Risk  (01/26/2022)  Depression (PHQ2-9): Low Risk  (09/24/2022)  Financial Resource Strain: Low Risk  (01/17/2021)  Physical Activity: Inactive (01/17/2021)  Social Connections: Moderately Integrated (01/17/2021)  Stress: No Stress Concern Present (01/17/2021)  Tobacco Use: Medium Risk (10/20/2022)    Readmission Risk Interventions     No data to display

## 2022-10-23 NOTE — TOC Transition Note (Signed)
Transition of Care [facility]) - CM/SW Discharge Note   Patient Details  Name: Terri Mckee MRN: [id number] Date of Birth: 09-06-1940  Transition of Care [facility]) CM/SW Contact:  Lorri Frederick, LCSW Phone Number: 10/23/2022, 1:22 PM   Clinical Narrative:   Pt discharging to [facility].  RN call report to [phone].      Final next level of care: Skilled Nursing Facility Barriers to Discharge: Barriers Resolved   Patient Goals and CMS Choice   Choice offered to / list presented to : Patient (requesting [facility])  Discharge Placement                Patient chooses bed at: [facility] Patient to be transferred to facility by: [facility] Name of family member notified: Margaretmary Lombard, granddaughter Patient and family notified of of transfer: 10/23/22  Discharge Plan and Services Additional resources added to the After Visit Summary for   In-house Referral: Clinical Social Work   Post Acute Care Choice: Skilled Nursing Facility                               Social Determinants of Health (SDOH) Interventions SDOH Screenings   Food Insecurity: No Food Insecurity (10/20/2022)  Housing: Low Risk  (10/20/2022)  Transportation Needs: No Transportation Needs (10/20/2022)  Utilities: Not At Risk (10/20/2022)  Alcohol Screen: Low Risk  (01/26/2022)  Depression (PHQ2-9): Low Risk  (09/24/2022)  Financial Resource Strain: Low Risk  (01/17/2021)  Physical Activity: Inactive (01/17/2021)  Social Connections: Moderately Integrated (01/17/2021)  Stress: No Stress Concern Present (01/17/2021)  Tobacco Use: Medium Risk (10/20/2022)     Readmission Risk Interventions     No data to display

## 2022-10-23 NOTE — Discharge Summary (Signed)
Patient ID: Terri Mckee MRN: [id number] DOB/AGE: 1940-10-28 82 y.o.  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   Cataract    Cataracts, bilateral 07/25/2015   Cervical cancer screening 07/07/2012   Colon polyps    Elevated serum creatinine    with ACE inhibitor (Normal MRA of renal arteries)   GERD (gastroesophageal reflux disease)    History of cyst of breast    Hoarseness of voice 03/13/2012   Hypertension    Lactose intolerance    causes gas   Medicare annual wellness visit, subsequent 09/02/2014   Muscle spasm of left lower extremity 03/21/2007   Qualifier: Diagnosis of  By: Nena Jordan    Osteoarthritis    Osteopenia 08/17/2014   Preventative health care 04/05/2016   Shortness of breath    Skin lesion 07/10/2012   Urinary incontinence 07/10/2012   Can't always get to bathroom on time   Vitamin D deficiency 04/03/2016    Surgeries: Procedure(s): RIGHT TOTAL HIP ARTHROPLASTY ANTERIOR APPROACH on 10/20/2022   Consultants:   Discharged Condition: Improved  Hospital Course: Terri Mckee is an 82 y.o. female who was admitted 10/20/2022 for operative treatment ofUnilateral primary osteoarthritis, right hip. Patient has severe unremitting pain that affects sleep, daily activities, and work/hobbies. After pre-op clearance the patient was taken to the operating room on 10/20/2022 and underwent  Procedure(s): RIGHT TOTAL HIP ARTHROPLASTY ANTERIOR APPROACH.    Patient was given  perioperative antibiotics:  Anti-infectives (From admission, onward)    Start     Dose/Rate Route Frequency Ordered Stop   10/20/22 1630  ceFAZolin (ANCEF) IVPB 1 g/50 mL premix        1 g 100 mL/hr over 30 Minutes Intravenous Every 6 hours 10/20/22 1533 10/20/22 2310   10/20/22 0815  ceFAZolin (ANCEF) IVPB 2g/100 mL premix        2 g 200 mL/hr over 30 Minutes Intravenous On call to O.R. 10/20/22 0806 10/20/22 1037        Patient was given sequential compression devices, early ambulation, and chemoprophylaxis to prevent DVT.  Patient benefited maximally from hospital stay and there were no complications.    Recent vital signs: Patient Vitals for the past 24 hrs:  BP Temp Temp src Pulse Resp SpO2  10/23/22 0854 -- -- -- 89 16 97 %  10/23/22 0755 (!) 123/58 98.6 F (37 C) Oral 70 14 95 %  10/23/22 0514 (!) 125/56 -- -- 81 16 96 %  10/22/22 2032 -- -- -- 84 18 --  10/22/22 1949 (!) 142/64 98.1 F (36.7 C) Oral 86 20 94 %     Recent laboratory studies:  Recent Labs    10/21/22 0614  WBC 8.1  HGB 10.4*  HCT 31.7*  PLT 175  NA 138  K 4.1  CL 105  CO2 23  BUN 16  CREATININE 1.47*  GLUCOSE 104*  CALCIUM 8.4*     Discharge Medications:   Allergies as of  10/23/2022       Reactions   Allegra [fexofenadine] Shortness Of Breath   Haemophilus Influenzae Vaccines Nausea And Vomiting   Erythromycin Nausea Only   Oxycodone-acetaminophen Nausea And Vomiting   Propoxyphene N-acetaminophen Nausea And Vomiting     severe vomiting   Zocor [simvastatin] Other (See Comments)     Myalgia        Medication List     STOP taking these medications    aspirin EC 81 MG tablet Replaced by: aspirin 81 MG chewable tablet       TAKE these medications    acetaminophen 650 MG CR tablet Commonly known as: TYLENOL Take 650 mg by mouth in the morning, at noon, and at bedtime.   albuterol 108 (90 Base) MCG/ACT inhaler Commonly known as: VENTOLIN HFA Inhale 2 puffs into the  lungs every 6 (six) hours as needed for wheezing or shortness of breath.   aspirin 81 MG chewable tablet Chew 1 tablet (81 mg total) by mouth 2 (two) times daily. Replaces: aspirin EC 81 MG tablet   Blood Glucose Monitoring Suppl Devi 1 each by Does not apply route in the morning, at noon, and at bedtime. May substitute to any manufacturer covered by patient's insurance.   BLOOD GLUCOSE TEST STRIPS Strp 1 each by In Vitro route in the morning, at noon, and at bedtime. May substitute to any manufacturer covered by patient's insurance.   calcium carbonate 600 MG Tabs tablet Commonly known as: OS-CAL Take 600 mg by mouth in the morning.   famotidine 20 MG tablet Commonly known as: PEPCID TAKE 1 TABLET(20 MG) BY MOUTH TWICE DAILY   felodipine 10 MG 24 hr tablet Commonly known as: PLENDIL TAKE 1 TABLET(10 MG) BY MOUTH DAILY   GARLIC PO Take 1 capsule by mouth in the morning.   Gemtesa 75 MG Tabs Generic drug: Vibegron Take 75 mg by mouth daily in the afternoon.   HYDROcodone-acetaminophen 5-325 MG tablet Commonly known as: NORCO/VICODIN Take 1-2 tablets by mouth every 6 (six) hours as needed for moderate pain (pain score 4-6).   Lancet Device Misc 1 each by Does not apply route in the morning, at noon, and at bedtime. May substitute to any manufacturer covered by patient's insurance.   Lancets Misc. Misc 1 each by Does not apply route in the morning, at noon, and at bedtime. May substitute to any manufacturer covered by patient's insurance.   LEG CRAMPS SL Place 3 tablets under the tongue at bedtime.   Mometasone Furo-Formoterol Fum 50-5 MCG/ACT Aero Inhale 2 puffs into the lungs 2 (two) times daily.   multivitamin with minerals Tabs tablet Take 1 tablet by mouth in the morning.   OMEGA 3 PO Take 1 capsule by mouth in the morning and at bedtime. OmegaXL   PATADAY OP Place 1 drop into both eyes 2 (two) times daily as needed (allergy/irritated eyes.).   POTASSIUM  PO Take 1 tablet by mouth in the morning.   VITAMIN D3 PO Take 1 tablet by mouth in the morning.   Xyzal 5 MG tablet Generic drug: levocetirizine Take 1 tablet (5 mg total) by mouth every evening.               Durable Medical Equipment  (From admission, onward)           Start     Ordered   10/20/22 1533  DME 3 n 1  Once        10/20/22 1533  10/20/22 1533  DME Walker rolling  Once       Question Answer Comment  Walker: With 5 Inch Wheels   Patient needs a walker to treat with the following condition Status post total replacement of right hip      10/20/22 1533            Diagnostic Studies: DG Pelvis Portable  Result Date: 10/20/2022 CLINICAL DATA:  Postop in PACU EXAM: PORTABLE PELVIS 1-2 VIEWS COMPARISON:  None Available. FINDINGS: Right hip arthroplasty in expected alignment. No periprosthetic lucency or fracture. Recent postsurgical change includes air and edema in the soft tissues. Lateral skin staples in place. Calcified uterine fibroids in the pelvis. IMPRESSION: Right hip arthroplasty without immediate postoperative complication. Electronically Signed   By: Narda Rutherford M.D.   On: 10/20/2022 14:55   DG HIP UNILAT WITH PELVIS 1V RIGHT  Result Date: 10/20/2022 CLINICAL DATA:  Elective surgery. EXAM: DG HIP (WITH OR WITHOUT PELVIS) 1V RIGHT COMPARISON:  None Available. FINDINGS: Three fluoroscopic spot views of the pelvis and right hip obtained in the operating room. Images during hip arthroplasty. Fluoroscopy time 15 seconds. Dose 1.81 mGy. IMPRESSION: Intraoperative fluoroscopy for right hip arthroplasty. Electronically Signed   By: Narda Rutherford M.D.   On: 10/20/2022 14:54   DG C-Arm 1-60 Min-No Report  Result Date: 10/20/2022 Fluoroscopy was utilized by the requesting physician.  No radiographic interpretation.   MM 3D DIAGNOSTIC MAMMOGRAM UNILATERAL LEFT BREAST  Result Date: 10/15/2022 CLINICAL DATA:  Screening recall for possible left breast  mass. EXAM: DIGITAL DIAGNOSTIC UNILATERAL LEFT MAMMOGRAM WITH TOMOSYNTHESIS AND CAD; ULTRASOUND LEFT BREAST LIMITED TECHNIQUE: Left digital diagnostic mammography and breast tomosynthesis was performed. The images were evaluated with computer-aided detection. ; Targeted ultrasound examination of the left breast was performed. COMPARISON:  Previous exam(s). ACR Breast Density Category b: There are scattered areas of fibroglandular density. FINDINGS: Additional tomograms were performed of the left breast. There is an oval circumscribed mass versus 2 adjacent masses in the outer left breast measuring 1.1 cm. Targeted ultrasound of the outer left breast was performed demonstrating multiple small areas of fibrocystic change. Two adjacent cysts in the left breast at 4 o'clock 5 cm from nipple together measure 1.3 x 0.4 x 0.9 cm. The cysts likely correspond with the mass seen in the outer left breast at mammography. No suspicious masses or any other worrisome abnormality seen in the outer left breast sonographically. IMPRESSION: No findings of malignancy in the left breast. RECOMMENDATION: Screening mammogram in one year.(Code:SM-B-01Y) I have discussed the findings and recommendations with the patient. If applicable, a reminder letter will be sent to the patient regarding the next appointment. BI-RADS CATEGORY  2: Benign. Electronically Signed   By: Edwin Cap M.D.   On: 10/15/2022 11:18   [place] LIMITED ULTRASOUND INCLUDING AXILLA LEFT BREAST   Result Date: 10/15/2022 CLINICAL DATA:  Screening recall for possible left breast mass. EXAM: DIGITAL DIAGNOSTIC UNILATERAL LEFT MAMMOGRAM WITH TOMOSYNTHESIS AND CAD; ULTRASOUND LEFT BREAST LIMITED TECHNIQUE: Left digital diagnostic mammography and breast tomosynthesis was performed. The images were evaluated with computer-aided detection. ; Targeted ultrasound examination of the left breast was performed. COMPARISON:  Previous exam(s). ACR Breast Density Category b: There  are scattered areas of fibroglandular density. FINDINGS: Additional tomograms were performed of the left breast. There is an oval circumscribed mass versus 2 adjacent masses in the outer left breast measuring 1.1 cm. Targeted ultrasound of the outer left breast was performed demonstrating multiple small areas of fibrocystic  change. Two adjacent cysts in the left breast at 4 o'clock 5 cm from nipple together measure 1.3 x 0.4 x 0.9 cm. The cysts likely correspond with the mass seen in the outer left breast at mammography. No suspicious masses or any other worrisome abnormality seen in the outer left breast sonographically. IMPRESSION: No findings of malignancy in the left breast. RECOMMENDATION: Screening mammogram in one year.(Code:SM-B-01Y) I have discussed the findings and recommendations with the patient. If applicable, a reminder letter will be sent to the patient regarding the next appointment. BI-RADS CATEGORY  2: Benign. Electronically Signed   By: Edwin Cap M.D.   On: 10/15/2022 11:18    Disposition: Discharge disposition: 03-Skilled Nursing Facility          Contact information for follow-up providers     Kathryne Hitch, MD Follow up in 2 week(s).   Specialty: Orthopedic Surgery Contact information: [address] [place] [address] [phone]         Bradd Canary, MD Follow up.   Specialty: Family Medicine Contact information: [address] [place] [address] [phone]         [facility] Follow up.   Specialty: Home Health Services Why: home health services will be provided by [facility], start of care within 48 hours post discharge Contact information: [address] [place] [address] [phone]              Contact information for after-discharge care     Destination     [facility] Preferred SNF .   Service: Skilled Nursing Contact information: [address] [place] [address] [phone]                      Signed: Kathryne Hitch 10/23/2022, 12:40 PM

## 2022-10-23 NOTE — Progress Notes (Signed)
Report given to nurse Neysa Bonito at [facility], Pt is no awaiting [facility] for transfer.

## 2022-10-23 NOTE — Progress Notes (Signed)
PT Cancellation Note  Patient Details Name: Rosenda Geffrard MRN: [id number] DOB: 23-Apr-1940   Cancelled Treatment:    Reason Eval/Treat Not Completed: Patient declined, no reason specified (Pt reported feeling dizzy and nauseous. Politely declined PT today. Will follow up if time allows however appears pt may be DC to SNF later today.)   Gladys Damme 10/23/2022, 10:16 AM

## 2022-10-23 NOTE — Progress Notes (Signed)
Occupational Therapy Treatment Patient Details Name: Terri Mckee MRN: [id number] DOB: 03/15/1940 Today's Date: 10/23/2022   History of present illness 82 y.o. female presents to [facility] hospital on 10/20/2022 for elective R THA. PMH includes HLD, HTN, GERD.   OT comments  Session focused on education of AE for LB ADLs as pt with difficulty reaching B feet and R hip pain with mobility. Overall pt requires Min-Mod A for use of various AE and reports interest in using these devices at home (provided handout/info on where to get). Pt continues to require hands on assist for all bed mobility and transfers with RLE support needed. Pt hopeful to progress home but aware of deficits and agreeable for postacute rehab if still needed at time of DC.      If plan is discharge home, recommend the following:  A lot of help with walking and/or transfers;A lot of help with bathing/dressing/bathroom;Assist for transportation;Assistance with cooking/housework;Help with stairs or ramp for entrance   Equipment Recommendations  Other (comment) (RW; hip kit AE)    Recommendations for Other Services      Precautions / Restrictions Precautions Precautions: Fall Precaution Comments: direct anterior THA Restrictions Weight Bearing Restrictions: Yes RLE Weight Bearing: Weight bearing as tolerated       Mobility Bed Mobility Overal bed mobility: Needs Assistance Bed Mobility: Supine to Sit, Sit to Supine     Supine to sit: Mod assist, HOB elevated, Used rails Sit to supine: Min assist   General bed mobility comments: good use of bed rails to lift trunk and swivel hips to EOB. Assist for RLE to EOB on R side with use of bed pad to scoot hips fully to EOB. Use of swivel method to get back to supine with OT assist for RLE    Transfers Overall transfer level: Needs assistance Equipment used: Rolling walker (2 wheels) Transfers: Sit to/from Stand Sit to Stand: Min assist, From elevated surface            General transfer comment: from very elevated bed, Min A to stand and Min A to side step at bedside with cues for quick step with L foot vs "shimmying" etc to minimize pressure as needed. cues to reach back and kick RLE out to minimize discomfort     Balance Overall balance assessment: Needs assistance Sitting-balance support: Feet supported Sitting balance-Leahy Scale: Fair     Standing balance support: Bilateral upper extremity supported, During functional activity Standing balance-Leahy Scale: Poor Standing balance comment: relies on BUEs in standing                           ADL either performed or assessed with clinical judgement   ADL Overall ADL's : Needs assistance/impaired                     Lower Body Dressing: Moderate assistance;Sit to/from stand;Sitting/lateral leans;With adaptive equipment Lower Body Dressing Details (indicate cue type and reason): focus on AE education for LB dressing. Pt able to return demo use of reacher/sock aide for sock mgmt with Min A, simulated donning pants with reacher at Mod A(will need increased assist in standing). Pt reports having a shoehorn and a reacher at home. Provided AE handout for pt reference to use at home. Reports her husband also has difficulty with sock/shoe mgmt at times  Extremity/Trunk Assessment Upper Extremity Assessment Upper Extremity Assessment: Generalized weakness;Right hand dominant   Lower Extremity Assessment Lower Extremity Assessment: Defer to PT evaluation        Vision   Vision Assessment?: No apparent visual deficits;Wears glasses for reading   Perception     Praxis      Cognition Arousal: Alert Behavior During Therapy: WFL for tasks assessed/performed Overall Cognitive Status: Within Functional Limits for tasks assessed                                          Exercises      Shoulder Instructions       General  Comments Pt reports feeling dizzy and nauseous, reports hydrocodone makes her sick. RN notified and OT inquired if other pain med options available    Pertinent Vitals/ Pain       Pain Assessment Pain Assessment: Faces Faces Pain Scale: Hurts little more Pain Location: R hip with mobility Pain Descriptors / Indicators: Discomfort, Grimacing Pain Intervention(s): Limited activity within patient's tolerance, Monitored during session, Premedicated before session, Repositioned  Home Living                                          Prior Functioning/Environment              Frequency  Min 1X/week        Progress Toward Goals  OT Goals(current goals can now be found in the care plan section)  Progress towards OT goals: Progressing toward goals  Acute Rehab OT Goals Patient Stated Goal: be able to get better and go home OT Goal Formulation: With patient Time For Goal Achievement: 11/05/22 Potential to Achieve Goals: Fair ADL Goals Pt Will Perform Grooming: with contact guard assist;sitting Pt Will Perform Lower Body Dressing: with contact guard assist;sitting/lateral leans;with adaptive equipment;sit to/from stand Pt Will Transfer to Toilet: with contact guard assist;ambulating Pt Will Perform Toileting - Clothing Manipulation and hygiene: with contact guard assist;sit to/from stand;sitting/lateral leans Additional ADL Goal #1: Pt will complete bed mobility with supervision.  Plan      Co-evaluation                 AM-PAC OT "6 Clicks" Daily Activity     Outcome Measure   Help from another person eating meals?: None Help from another person taking care of personal grooming?: A Little Help from another person toileting, which includes using toliet, bedpan, or urinal?: A Lot Help from another person bathing (including washing, rinsing, drying)?: A Lot Help from another person to put on and taking off regular upper body clothing?: A Little Help  from another person to put on and taking off regular lower body clothing?: A Lot 6 Click Score: 16    End of Session Equipment Utilized During Treatment: Rolling walker (2 wheels)  OT Visit Diagnosis: Other abnormalities of gait and mobility (R26.89);Muscle weakness (generalized) (M62.81);Pain Pain - Right/Left: Right Pain - part of body: Hip;Leg   Activity Tolerance Patient tolerated treatment well   Patient Left in bed;with call bell/phone within reach;with bed alarm set   Nurse Communication Mobility status;Other (comment) (nausea med request)        Time: 1478-2956 OT Time Calculation (min): 24 min  Charges: OT General Charges $OT Visit: 1  Visit OT Treatments $Self Care/Home Management : 23-37 mins  Bradd Canary, OTR/L Acute Rehab Services Office: [phone]   Lorre Munroe 10/23/2022, 8:46 AM

## 2022-10-23 NOTE — Progress Notes (Signed)
Patient ID: Terri Mckee, female   DOB: 1940/07/16, 82 y.o.   MRN: [id number] The patient is sitting up and eating lunch.  Apparently she does have a bed offer for short-term skilled nursing.  Her right hip incision looks good.  I placed a new Aquacel dressing.  She can be discharged today.  We will put prescriptions on her chart and a discharge summary as well.

## 2022-10-28 ENCOUNTER — Telehealth: Payer: Self-pay

## 2022-10-28 NOTE — Telephone Encounter (Signed)
I noticed the patient recently underwent a total hip replacement, and contacted the family to see if she was planning on attending tomorrow's visit.  Pt is currently in a rehab facility after undergoing a hip replacement. Spoke to pt's granddaughter to confirmed.  Appointment canceled for tomorrow.  Sent our best wishes for a speedy recovery.

## 2022-10-28 NOTE — Progress Notes (Deleted)
   Rubin Payor, PhD, LAT, ATC acting as a scribe for Clementeen Graham, MD.  Terri Mckee is a 82 y.o. female who presents to [facility] today for ***   Pertinent review of systems: ***  Relevant historical information: ***   Exam:  LMP  (LMP Unknown)  General: Well Developed, well nourished, and in no acute distress.   MSK: ***    Lab and Radiology Results No results found for this or any previous visit (from the past 72 hour(s)). No results found.     Assessment and Plan: 82 y.o. female with ***   PDMP not reviewed this encounter. No orders of the defined types were placed in this encounter.  No orders of the defined types were placed in this encounter.    Discussed warning signs or symptoms. Please see discharge instructions. Patient expresses understanding.   ***

## 2022-10-29 ENCOUNTER — Ambulatory Visit: Payer: Medicare Other | Admitting: Family Medicine

## 2022-11-02 ENCOUNTER — Ambulatory Visit (INDEPENDENT_AMBULATORY_CARE_PROVIDER_SITE_OTHER): Payer: Medicare Other | Admitting: Orthopaedic Surgery

## 2022-11-02 ENCOUNTER — Encounter: Payer: Self-pay | Admitting: Orthopaedic Surgery

## 2022-11-02 DIAGNOSIS — Z96641 Presence of right artificial hip joint: Secondary | ICD-10-CM

## 2022-11-02 NOTE — Progress Notes (Signed)
The patient is a 82 year old female is here for first postoperative visit status post a right total hip replacement.  She is staying in a skilled nursing facility as she recovers.  She reports a lot of right hip pain over the last few days but they have been really working out and she described them having her do squats and other strengthening exercises.  I have filled out notes for the skilled nursing facility to only have them work on balance and coordination with walking and ADLs.  She does not need to be doing hip abduction exercises or squats for now.  Her right hip incision looks good.  The staples were removed and Steri-Strips applied.  I did observe her stand and put weight on her right hip.  Her leg lengths are near equal.  From my standpoint the next time I need series in 4 weeks to see how she is doing from a mobility standpoint but no x-rays are needed.

## 2022-11-08 NOTE — Assessment & Plan Note (Signed)
Encouraged good sleep hygiene such as dark, quiet room. No blue/green glowing lights such as computer screens in bedroom. No alcohol or stimulants in evening. Cut down on caffeine as able. Regular exercise is helpful but not just prior to bed time.  

## 2022-11-08 NOTE — Assessment & Plan Note (Signed)
Hydrate and monitor 

## 2022-11-08 NOTE — Assessment & Plan Note (Signed)
Encourage heart healthy diet such as MIND or DASH diet, increase exercise, avoid trans fats, simple carbohydrates and processed foods, consider a krill or fish or flaxseed oil cap daily.  °

## 2022-11-08 NOTE — Assessment & Plan Note (Signed)
Well controlled, no changes to meds. Encouraged heart healthy diet such as the DASH diet and exercise as tolerated.  °

## 2022-11-08 NOTE — Assessment & Plan Note (Signed)
Has recently returned home after her THR and stay in Rehab

## 2022-11-08 NOTE — Assessment & Plan Note (Signed)
Supplement and monitor 

## 2022-11-09 ENCOUNTER — Ambulatory Visit (INDEPENDENT_AMBULATORY_CARE_PROVIDER_SITE_OTHER): Payer: Medicare Other | Admitting: Family Medicine

## 2022-11-09 ENCOUNTER — Encounter: Payer: Self-pay | Admitting: Family Medicine

## 2022-11-09 VITALS — BP 124/62 | HR 81 | Temp 97.8°F | Resp 18 | Ht 69.0 in | Wt 228.8 lb

## 2022-11-09 DIAGNOSIS — I10 Essential (primary) hypertension: Secondary | ICD-10-CM | POA: Diagnosis not present

## 2022-11-09 DIAGNOSIS — G4700 Insomnia, unspecified: Secondary | ICD-10-CM | POA: Diagnosis not present

## 2022-11-09 DIAGNOSIS — E782 Mixed hyperlipidemia: Secondary | ICD-10-CM | POA: Diagnosis not present

## 2022-11-09 DIAGNOSIS — E559 Vitamin D deficiency, unspecified: Secondary | ICD-10-CM

## 2022-11-09 DIAGNOSIS — R252 Cramp and spasm: Secondary | ICD-10-CM

## 2022-11-09 DIAGNOSIS — Z96641 Presence of right artificial hip joint: Secondary | ICD-10-CM

## 2022-11-09 LAB — COMPREHENSIVE METABOLIC PANEL
ALT: 22 U/L (ref 0–35)
AST: 14 U/L (ref 0–37)
Albumin: 3.9 g/dL (ref 3.5–5.2)
Alkaline Phosphatase: 79 U/L (ref 39–117)
BUN: 21 mg/dL (ref 6–23)
CO2: 26 meq/L (ref 19–32)
Calcium: 9.4 mg/dL (ref 8.4–10.5)
Chloride: 104 meq/L (ref 96–112)
Creatinine, Ser: 1.32 mg/dL — ABNORMAL HIGH (ref 0.40–1.20)
GFR: 37.54 mL/min — ABNORMAL LOW (ref >60.00)
Glucose, Bld: 99 mg/dL (ref 70–99)
Potassium: 4.2 meq/L (ref 3.5–5.1)
Sodium: 141 meq/L (ref 135–145)
Total Bilirubin: 0.5 mg/dL (ref 0.2–1.2)
Total Protein: 6.9 g/dL (ref 6.0–8.3)

## 2022-11-09 NOTE — Progress Notes (Signed)
Subjective:    Patient ID: Terri Mckee, female    DOB: 27-Feb-1940, 82 y.o.   MRN: [id number]  Chief Complaint  Patient presents with  . Follow-up    Rehab Right Hip    HPI Discussed the use of AI scribe software for clinical note transcription with the patient, who gave verbal consent to proceed.  History of Present Illness   The patient, with a history of recent orthopedic surgeries, presents with ongoing pain and mobility issues. She reports significant discomfort and difficulty in moving, which has been exacerbated by physical therapy sessions. The patient has been taking Tylenol for arthritis and hydrocodone for pain management, but reports that these medications only provide minimal relief. The patient also mentions a recent stay in a nursing home for two weeks, during which she experienced significant discomfort and difficulty with mobility. Since returning home, the patient has been receiving physical therapy at home, but reports that the therapy is often too strenuous and leaves her feeling worse. The patient's husband assists with mobility and personal care, but the patient reports that she often feels too weak to get out of bed or go to the bathroom without assistance. The patient expresses frustration with her current state of health and the limitations it imposes on her daily life.        Past Medical History:  Diagnosis Date  . 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 Acute upper respiratory infection 04/09/2014  . Anemia   . Arthritis    back  . Back pain   . 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 Cataract   . Cataracts, bilateral 07/25/2015  . Cervical cancer screening 07/07/2012  . Colon polyps   . Elevated serum creatinine    with ACE inhibitor (Normal MRA of renal arteries)  . GERD (gastroesophageal reflux disease)   . History of cyst of breast   . Hoarseness of voice 03/13/2012  .  Hypertension   . Lactose intolerance    causes gas  . Medicare annual wellness visit, subsequent 09/02/2014  . Muscle spasm of left lower extremity 03/21/2007   Qualifier: Diagnosis of  By: Nena Jordan   . Osteoarthritis   . Osteopenia 08/17/2014  . Preventative health care 04/05/2016  . Shortness of breath   . Skin lesion 07/10/2012  . Urinary incontinence 07/10/2012   Can't always get to bathroom on time  . Vitamin D deficiency 04/03/2016    Past Surgical History:  Procedure Laterality Date  . APPENDECTOMY    . BREAST CYST EXCISION Bilateral    right breast, twice  . OVARY SURGERY     right ovary & tube replacement  . TOTAL HIP ARTHROPLASTY Right 10/20/2022   Procedure: RIGHT TOTAL HIP ARTHROPLASTY ANTERIOR APPROACH;  Surgeon: Kathryne Hitch, MD;  Location: [facility] OR;  Service: Orthopedics;  Laterality: Right;  Attempted spinal converted to general  . WRIST GANGLION EXCISION     rt    Family History  Problem Relation Age of Onset  . Brain cancer Mother        deceased age 71  . Alcohol abuse Mother   . Obesity Mother   . Other Brother        died of sepsis  . Gout Brother   . Alcohol abuse Father   . Cancer Sister        throat  . Pulmonary embolism  Daughter   . Mental illness Daughter        anxiety  . Cancer Daughter        uterine?  . Diabetes Maternal Aunt   . Diabetes Maternal Aunt   . Seizures Maternal Aunt   . Birth defects Maternal Aunt   . Lupus Grandchild   . Sickle cell anemia Grandchild   . Colon cancer Neg Hx   . Breast cancer Neg Hx     Social History   Socioeconomic History  . Marital status: Married    Spouse name: Onalee Hua  . Number of children: 0  . Years of education: Not on file  . Highest education level: Not on file  Occupational History  . Occupation: retired  Tobacco Use  . Smoking status: Former    Current packs/day: 0.00    Types: Cigarettes    Quit date: 01/13/1991    Years since quitting: 31.8  . Smokeless  tobacco: Never  . Tobacco comments:    quit 20 years ago 1/2 ppd x 40 years  Vaping Use  . Vaping status: Never Used  Substance and Sexual Activity  . Alcohol use: No  . Drug use: No  . Sexual activity: Never    Comment: lives with husband, no dietary restrictions,   Other Topics Concern  . Not on file  Social History Narrative   Former Smoker - quit 30 years ago (1/2 ppd x 40 years) Daughter passed away in 12/02/20 d/t kidney cancer       Social Determinants of Health   Financial Resource Strain: Low Risk  (01/17/2021)   Overall Financial Resource Strain (CARDIA)   . Difficulty of Paying Living Expenses: Not hard at all  Food Insecurity: No Food Insecurity (10/20/2022)   Hunger Vital Sign   . Worried About Programme researcher, broadcasting/film/video in the Last Year: Never true   . Ran Out of Food in the Last Year: Never true  Transportation Needs: No Transportation Needs (10/20/2022)   PRAPARE - Transportation   . Lack of Transportation (Medical): No   . Lack of Transportation (Non-Medical): No  Physical Activity: Inactive (01/17/2021)   Exercise Vital Sign   . Days of Exercise per Week: 0 days   . Minutes of Exercise per Session: 0 min  Stress: No Stress Concern Present (01/17/2021)   [employer] of Occupational Health - Occupational Stress Questionnaire   . Feeling of Stress : Not at all  Social Connections: Moderately Integrated (01/17/2021)   Social Connection and Isolation Panel [NHANES]   . Frequency of Communication with Friends and Family: More than three times a week   . Frequency of Social Gatherings with Friends and Family: More than three times a week   . Attends Religious Services: More than 4 times per year   . Active Member of Clubs or Organizations: No   . Attends Banker Meetings: Never   . Marital Status: Married  Catering manager Violence: Not At Risk (10/20/2022)   Humiliation, Afraid, Rape, and Kick questionnaire   . Fear of Current or Ex-Partner: No   . Emotionally  Abused: No   . Physically Abused: No   . Sexually Abused: No    Outpatient Medications Prior to Visit  Medication Sig Dispense Refill  . acetaminophen (TYLENOL) 650 MG CR tablet Take 650 mg by mouth in the morning, at noon, and at bedtime.    Marland Kitchen albuterol (VENTOLIN HFA) 108 (90 Base) MCG/ACT inhaler Inhale 2 puffs into the lungs  every 6 (six) hours as needed for wheezing or shortness of breath. 18 g 5  . aspirin 81 MG chewable tablet Chew 1 tablet (81 mg total) by mouth 2 (two) times daily. 30 tablet 0  . Blood Glucose Monitoring Suppl DEVI 1 each by Does not apply route in the morning, at noon, and at bedtime. May substitute to any manufacturer covered by patient's insurance. 1 each 0  . calcium carbonate (OS-CAL) 600 MG TABS Take 600 mg by mouth in the morning.    . Cholecalciferol (VITAMIN D3 PO) Take 1 tablet by mouth in the morning.    . famotidine (PEPCID) 20 MG tablet TAKE 1 TABLET(20 MG) BY MOUTH TWICE DAILY 180 tablet 0  . felodipine (PLENDIL) 10 MG 24 hr tablet TAKE 1 TABLET(10 MG) BY MOUTH DAILY 90 tablet 1  . GARLIC PO Take 1 capsule by mouth in the morning.    . Homeopathic Products (LEG CRAMPS SL) Place 3 tablets under the tongue at bedtime.    Marland Kitchen HYDROcodone-acetaminophen (NORCO/VICODIN) 5-325 MG tablet Take 1-2 tablets by mouth every 6 (six) hours as needed for moderate pain (pain score 4-6). 30 tablet 0  . Mometasone Furo-Formoterol Fum 50-5 MCG/ACT AERO Inhale 2 puffs into the lungs 2 (two) times daily. 1 each 5  . Multiple Vitamin (MULTIVITAMIN WITH MINERALS) TABS tablet Take 1 tablet by mouth in the morning.    . Olopatadine HCl (PATADAY OP) Place 1 drop into both eyes 2 (two) times daily as needed (allergy/irritated eyes.).    Marland Kitchen Omega-3 Fatty Acids (OMEGA 3 PO) Take 1 capsule by mouth in the morning and at bedtime. OmegaXL    . POTASSIUM PO Take 1 tablet by mouth in the morning.    . Vibegron (GEMTESA) 75 MG TABS Take 75 mg by mouth daily in the afternoon.    Marland Kitchen XYZAL 5 MG  tablet Take 1 tablet (5 mg total) by mouth every evening. 90 tablet 3   No facility-administered medications prior to visit.    Allergies  Allergen Reactions  . Allegra [Fexofenadine] Shortness Of Breath  . Haemophilus Influenzae Vaccines Nausea And Vomiting  . Erythromycin Nausea Only  . Oxycodone-Acetaminophen Nausea And Vomiting  . Propoxyphene N-Acetaminophen Nausea And Vomiting      severe vomiting  . Zocor [Simvastatin] Other (See Comments)      Myalgia    Review of Systems  Constitutional:  Positive for malaise/fatigue. Negative for fever.  HENT:  Negative for congestion.   Eyes:  Negative for blurred vision.  Respiratory:  Negative for shortness of breath.   Cardiovascular:  Negative for chest pain, palpitations and leg swelling.  Gastrointestinal:  Negative for abdominal pain, blood in stool and nausea.  Genitourinary:  Negative for dysuria and frequency.  Musculoskeletal:  Positive for joint pain. Negative for falls.  Skin:  Negative for rash.  Neurological:  Negative for dizziness, loss of consciousness and headaches.  Endo/Heme/Allergies:  Negative for environmental allergies.  Psychiatric/Behavioral:  Negative for depression. The patient is not nervous/anxious.       Objective:    Physical Exam Constitutional:      General: She is not in acute distress.    Appearance: Normal appearance. She is well-developed. She is not toxic-appearing.  HENT:     Head: Normocephalic and atraumatic.     Right Ear: External ear normal.     Left Ear: External ear normal.     Nose: Nose normal.  Eyes:     General:  Right eye: No discharge.        Left eye: No discharge.     Conjunctiva/sclera: Conjunctivae normal.  Neck:     Thyroid: No thyromegaly.  Cardiovascular:     Rate and Rhythm: Normal rate and regular rhythm.     Heart sounds: Normal heart sounds. No murmur heard. Pulmonary:     Effort: Pulmonary effort is normal. No respiratory distress.     Breath  sounds: Normal breath sounds.  Abdominal:     General: Bowel sounds are normal.     Palpations: Abdomen is soft.     Tenderness: There is no abdominal tenderness. There is no guarding.  Musculoskeletal:        General: Normal range of motion.     Cervical back: Neck supple.  Lymphadenopathy:     Cervical: No cervical adenopathy.  Skin:    General: Skin is warm and dry.  Neurological:     Mental Status: She is alert and oriented to person, place, and time.  Psychiatric:        Mood and Affect: Mood normal.        Behavior: Behavior normal.        Thought Content: Thought content normal.        Judgment: Judgment normal.   BP 124/62 (BP Location: Left Arm, Patient Position: Sitting, Cuff Size: Large)   Pulse 81   Temp 97.8 F (36.6 C) (Oral)   Resp 18   Ht 5\' 9"  (1.753 m)   Wt 228 lb 12.8 oz (103.8 kg)   LMP  (LMP Unknown)   SpO2 100%   BMI 33.79 kg/m  Wt Readings from Last 3 Encounters:  11/09/22 228 lb 12.8 oz (103.8 kg)  10/20/22 223 lb (101.2 kg)  10/08/22 228 lb 4.8 oz (103.6 kg)    Diabetic Foot Exam - Simple   No data filed    Lab Results  Component Value Date   WBC 8.1 10/21/2022   HGB 10.4 (L) 10/21/2022   HCT 31.7 (L) 10/21/2022   PLT 175 10/21/2022   GLUCOSE 99 11/09/2022   CHOL 197 09/24/2022   TRIG 93.0 09/24/2022   HDL 53.80 09/24/2022   LDLCALC 125 (H) 09/24/2022   ALT 22 11/09/2022   AST 14 11/09/2022   NA 141 11/09/2022   K 4.2 11/09/2022   CL 104 11/09/2022   CREATININE 1.32 (H) 11/09/2022   BUN 21 11/09/2022   CO2 26 11/09/2022   TSH 2.77 09/24/2022   HGBA1C 5.2 09/24/2022    Lab Results  Component Value Date   TSH 2.77 09/24/2022   Lab Results  Component Value Date   WBC 8.1 10/21/2022   HGB 10.4 (L) 10/21/2022   HCT 31.7 (L) 10/21/2022   MCV 89.3 10/21/2022   PLT 175 10/21/2022   Lab Results  Component Value Date   NA 141 11/09/2022   K 4.2 11/09/2022   CO2 26 11/09/2022   GLUCOSE 99 11/09/2022   BUN 21 11/09/2022    CREATININE 1.32 (H) 11/09/2022   BILITOT 0.5 11/09/2022   ALKPHOS 79 11/09/2022   AST 14 11/09/2022   ALT 22 11/09/2022   PROT 6.9 11/09/2022   ALBUMIN 3.9 11/09/2022   CALCIUM 9.4 11/09/2022   ANIONGAP 10 10/21/2022   EGFR 42.0 01/29/2022   GFR 37.54 (L) 11/09/2022   Lab Results  Component Value Date   CHOL 197 09/24/2022   Lab Results  Component Value Date   HDL 53.80 09/24/2022   Lab  Results  Component Value Date   LDLCALC 125 (H) 09/24/2022   Lab Results  Component Value Date   TRIG 93.0 09/24/2022   Lab Results  Component Value Date   CHOLHDL 4 09/24/2022   Lab Results  Component Value Date   HGBA1C 5.2 09/24/2022       Assessment & Plan:  Essential hypertension Assessment & Plan: Well controlled, no changes to meds. Encouraged heart healthy diet such as the DASH diet and exercise as tolerated.   Orders: -     Comprehensive metabolic panel  Hyperlipidemia, mixed Assessment & Plan: Encourage heart healthy diet such as MIND or DASH diet, increase exercise, avoid trans fats, simple carbohydrates and processed foods, consider a krill or fish or flaxseed oil cap daily.    Insomnia, unspecified type Assessment & Plan: Encouraged good sleep hygiene such as dark, quiet room. No blue/green glowing lights such as computer screens in bedroom. No alcohol or stimulants in evening. Cut down on caffeine as able. Regular exercise is helpful but not just prior to bed time.    Muscle cramp, nocturnal Assessment & Plan: Hydrate and monitor    Status post total replacement of right hip Assessment & Plan: Has recently returned home after her THR and stay in Rehab   Vitamin D deficiency Assessment & Plan: Supplement and monitor     Assessment and Plan    Post-operative pain management Patient is post-operative from hip surgery and is experiencing pain. Currently taking 650mg  Tylenol for arthritis and hydrocodone as needed. Discussed the maximum daily dose of  Tylenol (3000mg ) and the importance of not exceeding this limit due to potential liver complications. -Continue Tylenol 650mg  as needed, not exceeding 3000mg  in 24 hours. -Continue hydrocodone as needed for severe pain. -Consider using lidocaine gel or Biofreeze for topical pain relief.  Renal function Elevated creatinine (1.47) noted in recent hospital records, indicating potential renal impairment. Discussed the potential renal risks associated with NSAIDs and the need for regular monitoring of renal function. -Order renal function tests today to assess current kidney function. -If renal function is stable, consider prescribing Voltaren gel for knee pain.  Physical therapy Patient has been contacted for physical therapy at home following hip surgery. -Encourage participation in physical therapy to improve mobility and strength.  Nutrition and hydration Discussed the importance of regular protein intake for muscle recovery and the need for regular hydration, especially given potential renal impairment. -Encourage regular protein intake every 4 hours. -Advise drinking 10 ounces of water every 1-2 hours.  Vaccinations Patient has received COVID-19 booster but has not received flu shot or RSV vaccine. -Recommend receiving RSV vaccine at pharmacy. -Consider flu shot at next appointment.  Follow-up Plan to review blood work results and assess progress in January. -Schedule follow-up appointment in January 2025. -Order blood work prior to January appointment.         Danise Edge, MD

## 2022-11-09 NOTE — Patient Instructions (Addendum)
Tylenol/Acetaminophen/APAP max of 3000 mg in 24 hours   Tylenol/Arthritis ES 500 mg tabs, 1-2 tabs up to 3 x daily as needed for pain, while not surpassing the total of 3000 Topical gels containing Lidocaine, Biofreeze, menthol are safe for kidneys RSV, R espiratory Syncitial Virus Vaccine, Arexvy  at pharmacy   Shingrix is the new shingles shot, 2 shots over 2-6 months, at pharmacy   Hip Pain The hip is the joint between the upper legs and the lower pelvis. The bones, cartilage, tendons, and muscles of your hip joint support your body and allow you to move around. Hip pain can range from a minor ache to severe pain in one or both of your hips. The pain may be felt on the inside of the hip joint near the groin, or on the outside near the buttocks and upper thigh. You may also have swelling or stiffness in your hip area. Follow these instructions at home: Managing pain, stiffness, and swelling     If told, put ice on the painful area. Put ice in a plastic bag. Place a towel between your skin and the bag. Leave the ice on for 20 minutes, 2-3 times a day. If told, apply heat to the affected area as often as told by your health care provider. Use the heat source that your provider recommends, such as a moist heat pack or a heating pad. Place a towel between your skin and the heat source. Leave the heat on for 20-30 minutes. If your skin turns bright red, remove the ice or heat right away to prevent skin damage. The risk of damage is higher if you cannot feel pain, heat, or cold. Activity Do exercises as told by your provider. Avoid activities that cause pain. General instructions  Take over-the-counter and prescription medicines only as told by your provider. Keep a journal of your symptoms. Write down: How often you have hip pain. The location of your pain. What the pain feels like. What makes the pain worse. Sleep with a pillow between your legs on your most comfortable side. Keep  all follow-up visits. Your provider will monitor your pain and activity. Contact a health care provider if: You cannot put weight on your leg. Your pain or swelling gets worse after a week. It gets harder to walk. You have a fever. Get help right away if: You fall. You have a sudden increase in pain and swelling in your hip. Your hip is red or swollen or very tender to touch. This information is not intended to replace advice given to you by your health care provider. Make sure you discuss any questions you have with your health care provider. Document Revised: 09/02/2021 Document Reviewed: 09/02/2021 Elsevier Patient Education  2024 [employer].

## 2022-11-23 ENCOUNTER — Telehealth: Payer: Self-pay | Admitting: Orthopaedic Surgery

## 2022-11-23 NOTE — Telephone Encounter (Signed)
Terri Mckee (PT) from [facility] called stating they faxed over orders for a 3 and  and also wheelchair with cushion. Please call Terri Mckee at [phone].

## 2022-11-24 NOTE — Telephone Encounter (Signed)
faxed

## 2022-11-30 ENCOUNTER — Ambulatory Visit (INDEPENDENT_AMBULATORY_CARE_PROVIDER_SITE_OTHER): Payer: Medicare Other | Admitting: Orthopaedic Surgery

## 2022-11-30 ENCOUNTER — Other Ambulatory Visit: Payer: Self-pay | Admitting: Family Medicine

## 2022-11-30 DIAGNOSIS — Z96641 Presence of right artificial hip joint: Secondary | ICD-10-CM

## 2022-11-30 NOTE — Progress Notes (Signed)
The patient is now 6 weeks status post a right total hip replacement.  She does ambulate with a rolling walker and reports good range of motion and strength of her right hip.  She still denies any left hip pain at all.  She does have arthritis of the left hip but again is asymptomatic.  She says her biggest complaint is getting out of any chair at home and she would like to have a wheelchair that is more starting that she can push out of more easily.  She does have bilateral upper extremity weakness.  Her right operative hip moves smoothly and fluidly as is her left hip.  From my standpoint I do not need to see her back for 3 months unless she is having any issues.  Will have a low AP pelvis and lateral of her right operative hip at that visit.  I did give her a prescription for standard wheelchair to see if she can take that by a medical supply store.

## 2023-01-14 ENCOUNTER — Other Ambulatory Visit: Payer: Self-pay | Admitting: Family Medicine

## 2023-01-25 NOTE — Assessment & Plan Note (Signed)
 hgba1c acceptable, minimize simple carbs. Increase exercise as tolerated.

## 2023-01-25 NOTE — Assessment & Plan Note (Signed)
 Encouraged good sleep hygiene such as dark, quiet room. No blue/green glowing lights such as computer screens in bedroom. No alcohol or stimulants in evening. Cut down on caffeine as able. Regular exercise is helpful but not just prior to bed time.

## 2023-01-25 NOTE — Assessment & Plan Note (Signed)
 Encourage heart healthy diet such as MIND or DASH diet, increase exercise, avoid trans fats, simple carbohydrates and processed foods, consider a krill or fish or flaxseed oil cap daily.

## 2023-01-25 NOTE — Assessment & Plan Note (Signed)
 Hydrate and monitor

## 2023-01-25 NOTE — Assessment & Plan Note (Signed)
 Supplement and monitor

## 2023-01-26 ENCOUNTER — Ambulatory Visit: Payer: Medicare Other | Admitting: Family Medicine

## 2023-01-26 ENCOUNTER — Ambulatory Visit (HOSPITAL_BASED_OUTPATIENT_CLINIC_OR_DEPARTMENT_OTHER)
Admission: RE | Admit: 2023-01-26 | Discharge: 2023-01-26 | Disposition: A | Discharge status: Home / Self Care | Payer: Medicare Other | Source: Ambulatory Visit | Attending: Family Medicine | Admitting: Family Medicine

## 2023-01-26 VITALS — BP 136/72 | HR 72 | Temp 97.7°F | Resp 18 | Ht 69.0 in | Wt 231.6 lb

## 2023-01-26 DIAGNOSIS — E559 Vitamin D deficiency, unspecified: Secondary | ICD-10-CM

## 2023-01-26 DIAGNOSIS — E782 Mixed hyperlipidemia: Secondary | ICD-10-CM | POA: Diagnosis not present

## 2023-01-26 DIAGNOSIS — E88819 Insulin resistance, unspecified: Secondary | ICD-10-CM | POA: Diagnosis not present

## 2023-01-26 DIAGNOSIS — G8929 Other chronic pain: Secondary | ICD-10-CM | POA: Insufficient documentation

## 2023-01-26 DIAGNOSIS — R252 Cramp and spasm: Secondary | ICD-10-CM

## 2023-01-26 DIAGNOSIS — M25562 Pain in left knee: Secondary | ICD-10-CM

## 2023-01-26 DIAGNOSIS — M25561 Pain in right knee: Secondary | ICD-10-CM | POA: Insufficient documentation

## 2023-01-26 DIAGNOSIS — G4700 Insomnia, unspecified: Secondary | ICD-10-CM | POA: Diagnosis not present

## 2023-01-26 LAB — COMPREHENSIVE METABOLIC PANEL
ALT: 10 U/L (ref 0–35)
AST: 13 U/L (ref 0–37)
Albumin: 4.1 g/dL (ref 3.5–5.2)
Alkaline Phosphatase: 59 U/L (ref 39–117)
BUN: 16 mg/dL (ref 6–23)
CO2: 26 meq/L (ref 19–32)
Calcium: 9.1 mg/dL (ref 8.4–10.5)
Chloride: 102 meq/L (ref 96–112)
Creatinine, Ser: 1.27 mg/dL — ABNORMAL HIGH (ref 0.40–1.20)
GFR: 39.26 mL/min — ABNORMAL LOW (ref >60.00)
Glucose, Bld: 76 mg/dL (ref 70–99)
Potassium: 3.6 meq/L (ref 3.5–5.1)
Sodium: 139 meq/L (ref 135–145)
Total Bilirubin: 0.4 mg/dL (ref 0.2–1.2)
Total Protein: 6.9 g/dL (ref 6.0–8.3)

## 2023-01-26 LAB — CBC WITH DIFFERENTIAL/PLATELET
Basophils Absolute: 0 10*3/uL (ref 0.0–0.1)
Basophils Relative: 0.5 % (ref 0.0–3.0)
Eosinophils Absolute: 0.2 10*3/uL (ref 0.0–0.7)
Eosinophils Relative: 3.1 % (ref 0.0–5.0)
HCT: 39.8 % (ref 36.0–46.0)
Hemoglobin: 12.9 g/dL (ref 12.0–15.0)
Lymphocytes Relative: 32 % (ref 12.0–46.0)
Lymphs Abs: 2.1 10*3/uL (ref 0.7–4.0)
MCHC: 32.4 g/dL (ref 30.0–36.0)
MCV: 88.8 fL (ref 78.0–100.0)
Monocytes Absolute: 0.6 10*3/uL (ref 0.1–1.0)
Monocytes Relative: 9 % (ref 3.0–12.0)
Neutro Abs: 3.6 10*3/uL (ref 1.4–7.7)
Neutrophils Relative %: 55.4 % (ref 43.0–77.0)
Platelets: 216 10*3/uL (ref 150.0–400.0)
RBC: 4.49 Mil/uL (ref 3.87–5.11)
RDW: 14.6 % (ref 11.5–15.5)
WBC: 6.5 10*3/uL (ref 4.0–10.5)

## 2023-01-26 LAB — VITAMIN D 25 HYDROXY (VIT D DEFICIENCY, FRACTURES): VITD: 37.02 ng/mL (ref 30.00–100.00)

## 2023-01-26 LAB — MAGNESIUM: Magnesium: 2.1 mg/dL (ref 1.5–2.5)

## 2023-01-26 LAB — LIPID PANEL
Cholesterol: 209 mg/dL — ABNORMAL HIGH (ref 0–200)
HDL: 46.9 mg/dL (ref >39.00)
LDL Cholesterol: 140 mg/dL — ABNORMAL HIGH (ref 0–99)
NonHDL: 161.77
Total CHOL/HDL Ratio: 4
Triglycerides: 107 mg/dL (ref 0.0–149.0)
VLDL: 21.4 mg/dL (ref 0.0–40.0)

## 2023-01-26 LAB — HEMOGLOBIN A1C: Hgb A1c MFr Bld: 5.5 % (ref 4.6–6.5)

## 2023-01-26 LAB — TSH: TSH: 2.77 u[IU]/mL (ref 0.35–5.50)

## 2023-01-26 NOTE — Patient Instructions (Signed)
 Shingrix is the new shingles shot, 2 shots over 2-6 months, confirm coverage with insurance and document, then can return here for shots with nurse appt or at pharmacy   RSV, Respiratory Syncitial Virus Vaccine, Arexvy pharmacy

## 2023-01-27 ENCOUNTER — Encounter: Payer: Self-pay | Admitting: Family Medicine

## 2023-01-27 NOTE — Progress Notes (Signed)
 Subjective:    Patient ID: Terri Mckee, female    DOB: 12/12/1940, 83 y.o.   MRN: [id number]  Chief Complaint  Patient presents with  . Follow-up    3 month    HPI Discussed the use of AI scribe software for clinical note transcription with the patient, who gave verbal consent to proceed.  History of Present Illness   The patient, with a history of hip surgery, presents with increasing knee pain and stiffness. The pain is not constant but is particularly noticeable when rising from a seated position. The patient reports that the knees feel weak and stiff after sitting for prolonged periods, making it difficult to stand up. Once up and moving, the patient reports no pain in the knees. The patient has tried topical treatments for the pain, but these have not provided relief. The patient also mentions that they have been taking a multivitamin supplement, Memory XL, and asks the doctor about its safety and efficacy. The patient has no other new or worsening symptoms.        Past Medical History:  Diagnosis Date  . 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 Acute upper respiratory infection 04/09/2014  . Anemia   . Arthritis    back  . Back pain   . 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 Cataract   . Cataracts, bilateral 07/25/2015  . Cervical cancer screening 07/07/2012  . Colon polyps   . Elevated serum creatinine    with ACE inhibitor (Normal MRA of renal arteries)  . GERD (gastroesophageal reflux disease)   . History of cyst of breast   . Hoarseness of voice 03/13/2012  . Hypertension   . Lactose intolerance    causes gas  . Medicare annual wellness visit, subsequent 09/02/2014  . Muscle spasm of left lower extremity 03/21/2007   Qualifier: Diagnosis of  By: [name]   . Osteoarthritis   . Osteopenia 08/17/2014  . Preventative health care 04/05/2016  . Shortness of breath   . Skin lesion  07/10/2012  . Urinary incontinence 07/10/2012   Can't always get to bathroom on time  . Vitamin D  deficiency 04/03/2016    Past Surgical History:  Procedure Laterality Date  . APPENDECTOMY    . BREAST CYST EXCISION Bilateral    right breast, twice  . OVARY SURGERY     right ovary & tube replacement  . 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 WRIST GANGLION EXCISION     rt    Family History  Problem Relation Age of Onset  . Brain cancer Mother        deceased age 5  . Alcohol abuse Mother   . Obesity Mother   . Other Brother        died of sepsis  . Gout Brother   . Alcohol abuse Father   . Cancer Sister        throat  . Pulmonary embolism Daughter   . Mental illness Daughter        anxiety  . Cancer Daughter        uterine?  . Diabetes Maternal Aunt   . Diabetes Maternal Aunt   . Seizures Maternal Aunt   .  Birth defects Maternal Aunt   . Lupus Grandchild   . Sickle cell anemia Grandchild   . Colon cancer Neg Hx   . Breast cancer Neg Hx     Social History   Socioeconomic History  . Marital status: Married    Spouse name: Alm  . Number of children: 0  . Years of education: Not on file  . Highest education level: Not on file  Occupational History  . Occupation: retired  Tobacco Use  . Smoking status: Former    Current packs/day: 0.00    Types: Cigarettes    Quit date: 01/13/1991    Years since quitting: 32.0  . Smokeless tobacco: Never  . Tobacco comments:    quit 20 years ago 1/2 ppd x 40 years  Vaping Use  . Vaping status: Never Used  Substance and Sexual Activity  . Alcohol use: No  . Drug use: No  . Sexual activity: Never    Comment: lives with husband, no dietary restrictions,   Other Topics Concern  . Not on file  Social History Narrative    Former Smoker - quit 30 years ago (1/2 ppd x 40 years) Daughter passed away in Jan 06, 2021 d/t kidney cancer       Social Drivers of Health   Financial Resource Strain: Low Risk  (01/17/2021)   Overall Financial Resource Strain (CARDIA)   . Difficulty of Paying Living Expenses: Not hard at all  Food Insecurity: No Food Insecurity (10/20/2022)   Hunger Vital Sign   . Worried About Programme Researcher, Broadcasting/film/video in the Last Year: Never true   . Ran Out of Food in the Last Year: Never true  Transportation Needs: No Transportation Needs (10/20/2022)   PRAPARE - Transportation   . Lack of Transportation (Medical): No   . Lack of Transportation (Non-Medical): No  Physical Activity: Inactive (01/17/2021)   Exercise Vital Sign   . Days of Exercise per Week: 0 days   . Minutes of Exercise per Session: 0 min  Stress: No Stress Concern Present (01/17/2021)   [employer] of Occupational Health - Occupational Stress Questionnaire   . Feeling of Stress : Not at all  Social Connections: Moderately Integrated (01/17/2021)   Social Connection and Isolation Panel [NHANES]   . Frequency of Communication with Friends and Family: More than three times a week   . Frequency of Social Gatherings with Friends and Family: More than three times a week   . Attends Religious Services: More than 4 times per year   . Active Member of Clubs or Organizations: No   . Attends Banker Meetings: Never   . Marital Status: Married  Catering Manager Violence: Not At Risk (10/20/2022)   Humiliation, Afraid, Rape, and Kick questionnaire   . Fear of Current or Ex-Partner: No   . Emotionally Abused: No   . Physically Abused: No   . Sexually Abused: No    Outpatient Medications Prior to Visit  Medication Sig Dispense Refill  . acetaminophen  (TYLENOL ) 650 MG CR tablet Take 650 mg by mouth in the morning, at noon, and at bedtime.    . albuterol  (VENTOLIN  HFA) 108 (90 Base) MCG/ACT inhaler Inhale 2 puffs into the lungs every 6 (six)  hours as needed for wheezing or shortness of breath. 18 g 5  . aspirin  81 MG chewable tablet Chew 1 tablet (81 mg total) by mouth 2 (two) times daily. 30 tablet 0  . Blood Glucose Monitoring Suppl DEVI 1 each by  Does not apply route in the morning, at noon, and at bedtime. May substitute to any manufacturer covered by patient's insurance. 1 each 0  . calcium carbonate (OS-CAL) 600 MG TABS Take 600 mg by mouth in the morning.    . Cholecalciferol (VITAMIN D3 PO) Take 1 tablet by mouth in the morning.    . famotidine  (PEPCID ) 20 MG tablet Take 1 tablet (20 mg total) by mouth 2 (two) times daily. 180 tablet 0  . felodipine  (PLENDIL ) 10 MG 24 hr tablet TAKE 1 TABLET(10 MG) BY MOUTH DAILY 90 tablet 1  . GARLIC PO Take 1 capsule by mouth in the morning.    . Homeopathic Products (LEG CRAMPS SL) Place 3 tablets under the tongue at bedtime.    . HYDROcodone -acetaminophen  (NORCO/VICODIN) 5-325 MG tablet Take 1-2 tablets by mouth every 6 (six) hours as needed for moderate pain (pain score 4-6). 30 tablet 0  . Mometasone  Furo-Formoterol  Fum 50-5 MCG/ACT AERO Inhale 2 puffs into the lungs 2 (two) times daily. 1 each 5  . Multiple Vitamin (MULTIVITAMIN WITH MINERALS) TABS tablet Take 1 tablet by mouth in the morning.    . Olopatadine HCl (PATADAY OP) Place 1 drop into both eyes 2 (two) times daily as needed (allergy/irritated eyes.).    SABRA Omega-3 Fatty Acids (OMEGA 3 PO) Take 1 capsule by mouth in the morning and at bedtime. OmegaXL    . POTASSIUM PO Take 1 tablet by mouth in the morning.    . Vibegron (GEMTESA) 75 MG TABS Take 75 mg by mouth daily in the afternoon.    . XYZAL  5 MG tablet Take 1 tablet (5 mg total) by mouth every evening. 90 tablet 3   No facility-administered medications prior to visit.    Allergies  Allergen Reactions  . Allegra [Fexofenadine] Shortness Of Breath  . Haemophilus Influenzae Vaccines Nausea And Vomiting  . Erythromycin Nausea Only  . Oxycodone-Acetaminophen  Nausea And  Vomiting  . Propoxyphene N-Acetaminophen  Nausea And Vomiting      severe vomiting  . Zocor [Simvastatin] Other (See Comments)      Myalgia    Review of Systems  Constitutional:  Negative for fever and malaise/fatigue.  HENT:  Negative for congestion.   Eyes:  Negative for blurred vision.  Respiratory:  Negative for shortness of breath.   Cardiovascular:  Negative for chest pain, palpitations and leg swelling.  Gastrointestinal:  Negative for abdominal pain, blood in stool and nausea.  Genitourinary:  Negative for dysuria and frequency.  Musculoskeletal:  Positive for joint pain. Negative for falls.  Skin:  Negative for rash.  Neurological:  Negative for dizziness, loss of consciousness and headaches.  Endo/Heme/Allergies:  Negative for environmental allergies.  Psychiatric/Behavioral:  Negative for depression. The patient is not nervous/anxious.        Objective:    Physical Exam Constitutional:      General: She is not in acute distress.    Appearance: Normal appearance. She is well-developed. She is not toxic-appearing.  HENT:     Head: Normocephalic and atraumatic.     Right Ear: External ear normal.     Left Ear: External ear normal.     Nose: Nose normal.  Eyes:     General:        Right eye: No discharge.        Left eye: No discharge.     Conjunctiva/sclera: Conjunctivae normal.  Neck:     Thyroid : No thyromegaly.  Cardiovascular:     Rate  and Rhythm: Normal rate and regular rhythm.     Heart sounds: Normal heart sounds. No murmur heard. Pulmonary:     Effort: Pulmonary effort is normal. No respiratory distress.     Breath sounds: Normal breath sounds.  Abdominal:     General: Bowel sounds are normal.     Palpations: Abdomen is soft.     Tenderness: There is no abdominal tenderness. There is no guarding.  Musculoskeletal:        General: Normal range of motion.     Cervical back: Neck supple.  Lymphadenopathy:     Cervical: No cervical adenopathy.   Skin:    General: Skin is warm and dry.  Neurological:     Mental Status: She is alert and oriented to person, place, and time.  Psychiatric:        Mood and Affect: Mood normal.        Behavior: Behavior normal.        Thought Content: Thought content normal.        Judgment: Judgment normal.    BP 136/72 (BP Location: Left Arm, Patient Position: Sitting, Cuff Size: Large)   Pulse 72   Temp 97.7 F (36.5 C) (Oral)   Resp 18   Ht 5' 9 (1.753 m)   Wt 231 lb 9.6 oz (105.1 kg)   LMP  (LMP Unknown)   SpO2 98%   BMI 34.20 kg/m  Wt Readings from Last 3 Encounters:  01/26/23 231 lb 9.6 oz (105.1 kg)  11/09/22 228 lb 12.8 oz (103.8 kg)  10/20/22 223 lb (101.2 kg)    Diabetic Foot Exam - Simple   No data filed    Lab Results  Component Value Date   WBC 6.5 01/26/2023   HGB 12.9 01/26/2023   HCT 39.8 01/26/2023   PLT 216.0 01/26/2023   GLUCOSE 76 01/26/2023   CHOL 209 (H) 01/26/2023   TRIG 107.0 01/26/2023   HDL 46.90 01/26/2023   LDLCALC 140 (H) 01/26/2023   ALT 10 01/26/2023   AST 13 01/26/2023   NA 139 01/26/2023   K 3.6 01/26/2023   CL 102 01/26/2023   CREATININE 1.27 (H) 01/26/2023   BUN 16 01/26/2023   CO2 26 01/26/2023   TSH 2.77 01/26/2023   HGBA1C 5.5 01/26/2023    Lab Results  Component Value Date   TSH 2.77 01/26/2023   Lab Results  Component Value Date   WBC 6.5 01/26/2023   HGB 12.9 01/26/2023   HCT 39.8 01/26/2023   MCV 88.8 01/26/2023   PLT 216.0 01/26/2023   Lab Results  Component Value Date   NA 139 01/26/2023   K 3.6 01/26/2023   CO2 26 01/26/2023   GLUCOSE 76 01/26/2023   BUN 16 01/26/2023   CREATININE 1.27 (H) 01/26/2023   BILITOT 0.4 01/26/2023   ALKPHOS 59 01/26/2023   AST 13 01/26/2023   ALT 10 01/26/2023   PROT 6.9 01/26/2023   ALBUMIN 4.1 01/26/2023   CALCIUM 9.1 01/26/2023   ANIONGAP 10 10/21/2022   EGFR 42.0 01/29/2022   GFR 39.26 (L) 01/26/2023   Lab Results  Component Value Date   CHOL 209 (H) 01/26/2023    Lab Results  Component Value Date   HDL 46.90 01/26/2023   Lab Results  Component Value Date   LDLCALC 140 (H) 01/26/2023   Lab Results  Component Value Date   TRIG 107.0 01/26/2023   Lab Results  Component Value Date   CHOLHDL 4 01/26/2023  Lab Results  Component Value Date   HGBA1C 5.5 01/26/2023       Assessment & Plan:  Hyperlipidemia, mixed Assessment & Plan: Encourage heart healthy diet such as MIND or DASH diet, increase exercise, avoid trans fats, simple carbohydrates and processed foods, consider a krill or fish or flaxseed oil cap daily.   Orders: -     Lipid panel -     TSH  Insomnia, unspecified type Assessment & Plan: Encouraged good sleep hygiene such as dark, quiet room. No blue/green glowing lights such as computer screens in bedroom. No alcohol or stimulants in evening. Cut down on caffeine as able. Regular exercise is helpful but not just prior to bed time.    Insulin  resistance Assessment & Plan: hgba1c acceptable, minimize simple carbs. Increase exercise as tolerated.   Orders: -     Comprehensive metabolic panel -     Hemoglobin A1c -     TSH  Muscle cramps Assessment & Plan: Hydrate and monitor   Orders: -     Magnesium  Vitamin D  deficiency Assessment & Plan: Supplement and monitor  Orders: -     VITAMIN D  25 Hydroxy (Vit-D Deficiency, Fractures)  Chronic pain of right knee -     CBC with Differential/Platelet -     DG Knee Complete 4 Views Right; Future  Chronic pain of left knee -     CBC with Differential/Platelet -     DG Knee Complete 4 Views Left; Future    Assessment and Plan    Knee Arthritis Reports of stiffness and weakness in knees, particularly after sitting. No ongoing chronic pain reported. -Order knee X-rays for baseline assessment of arthritis severity. -Consider physical therapy for strengthening muscles around the knees. -Consider consultation with orthopedics if symptoms worsen.  General Health  Maintenance Discussed the risks and benefits of various vaccinations including shingles, RSV, and pneumonia. -Consider receiving shingles and RSV vaccinations at the pharmacy. -Continue with current pneumonia vaccination schedule. -Tetanus booster due in 2027, unless injury occurs.  Follow-up in 3 months, with the option for a virtual visit.         Harlene Horton, MD

## 2023-02-04 ENCOUNTER — Ambulatory Visit: Payer: Medicare Other

## 2023-02-04 VITALS — Ht 69.0 in | Wt 231.0 lb

## 2023-02-04 DIAGNOSIS — Z0000 Encounter for general adult medical examination without abnormal findings: Secondary | ICD-10-CM | POA: Diagnosis not present

## 2023-02-04 NOTE — Patient Instructions (Addendum)
[name] , Thank you for taking time to come for your Medicare Wellness Visit. I appreciate your ongoing commitment to your health goals. Please review the following plan we discussed and let me know if I can assist you in the future.   Referrals/Orders/Follow-Ups/Clinician Recommendations:   This is a list of the screening recommended for you and due dates:  Health Maintenance  Topic Date Due   Zoster (Shingles) Vaccine (1 of 2) 04/26/2023*   Medicare Annual Wellness Visit  02/04/2024   DTaP/Tdap/Td vaccine (2 - Tdap) 07/24/2025   Pneumonia Vaccine  Completed   DEXA scan (bone density measurement)  Completed   HPV Vaccine  Aged Out   Flu Shot  Discontinued   Colon Cancer Screening  Discontinued   COVID-19 Vaccine  Discontinued  *Topic was postponed. The date shown is not the original due date.  Opioid Pain Medicine Management Opioids are powerful medicines that are used to treat moderate to severe pain. When used for short periods of time, they can help you to: Sleep better. Do better in physical or occupational therapy. Feel better in the first few days after an injury. Recover from surgery. Opioids should be taken with the supervision of a trained health care provider. They should be taken for the shortest period of time possible. This is because opioids can be addictive, and the longer you take opioids, the greater your risk of addiction. This addiction can also be called opioid use disorder. What are the risks? Using opioid pain medicines for longer than 3 days increases your risk of side effects. Side effects include: Constipation. Nausea and vomiting. Breathing difficulties (respiratory depression). Drowsiness. Confusion. Opioid use disorder. Itching. Taking opioid pain medicine for a long period of time can affect your ability to do daily tasks. It also puts you at risk for: Motor vehicle crashes. Depression. Suicide. Heart attack. Overdose, which can be  life-threatening. What is a pain treatment plan? A pain treatment plan is an agreement between you and your health care provider. Pain is unique to each person, and treatments vary depending on your condition. To manage your pain, you and your health care provider need to work together. To help you do this: Discuss the goals of your treatment, including how much pain you might expect to have and how you will manage the pain. Review the risks and benefits of taking opioid medicines. Remember that a good treatment plan uses more than one approach and minimizes the chance of side effects. Be honest about the amount of medicines you take and about any drug or alcohol use. Get pain medicine prescriptions from only one health care provider. Pain can be managed with many types of alternative treatments. Ask your health care provider to refer you to one or more specialists who can help you manage pain through: Physical or occupational therapy. Counseling (cognitive behavioral therapy). Good nutrition. Biofeedback. Massage. Meditation. Non-opioid medicine. Following a gentle exercise program. How to use opioid pain medicine Taking medicine Take your pain medicine exactly as told by your health care provider. Take it only when you need it. If your pain gets less severe, you may take less than your prescribed dose if your health care provider approves. If you are not having pain, do nottake pain medicine unless your health care provider tells you to take it. If your pain is severe, do nottry to treat it yourself by taking more pills than instructed on your prescription. Contact your health care provider for help. Write down the  times when you take your pain medicine. It is easy to become confused while on pain medicine. Writing the time can help you avoid overdose. Take other over-the-counter or prescription medicines only as told by your health care provider. Keeping yourself and others safe  While  you are taking opioid pain medicine: Do not drive, use machinery, or power tools. Do not sign legal documents. Do not drink alcohol. Do not take sleeping pills. Do not supervise children by yourself. Do not do activities that require climbing or being in high places. Do not go to a lake, river, ocean, spa, or swimming pool. Do not share your pain medicine with anyone. Keep pain medicine in a locked cabinet or in a secure area where pets and children cannot reach it. Stopping your use of opioids If you have been taking opioid medicine for more than a few weeks, you may need to slowly decrease (taper) how much you take until you stop completely. Tapering your use of opioids can decrease your risk of symptoms of withdrawal, such as: Pain and cramping in the abdomen. Nausea. Sweating. Sleepiness. Restlessness. Uncontrollable shaking (tremors). Cravings for the medicine. Do not attempt to taper your use of opioids on your own. Talk with your health care provider about how to do this. Your health care provider may prescribe a step-down schedule based on how much medicine you are taking and how long you have been taking it. Getting rid of leftover pills Do not save any leftover pills. Get rid of leftover pills safely by: Taking the medicine to a prescription take-back program. This is usually offered by the county or law enforcement. Bringing them to a pharmacy that has a drug disposal container. Flushing them down the toilet. Check the label or package insert of your medicine to see whether this is safe to do. Throwing them out in the trash. Check the label or package insert of your medicine to see whether this is safe to do. If it is safe to throw it out, remove the medicine from the original container, put it into a sealable bag or container, and mix it with used coffee grounds, food scraps, dirt, or cat litter before putting it in the trash. Follow these instructions at home: Activity Do  exercises as told by your health care provider. Avoid activities that make your pain worse. Return to your normal activities as told by your health care provider. Ask your health care provider what activities are safe for you. General instructions You may need to take these actions to prevent or treat constipation: Drink enough fluid to keep your urine pale yellow. Take over-the-counter or prescription medicines. Eat foods that are high in fiber, such as beans, whole grains, and fresh fruits and vegetables. Limit foods that are high in fat and processed sugars, such as fried or sweet foods. Keep all follow-up visits. This is important. Where to find support If you have been taking opioids for a long time, you may benefit from receiving support for quitting from a local support group or counselor. Ask your health care provider for a referral to these resources in your area. Where to find more information Centers for Disease Control and Prevention (CDC): FootballExhibition.com.br [place] Food and Drug Administration (FDA): PumpkinSearch.com.ee Get help right away if: You may have taken too much of an opioid (overdosed). Common symptoms of an overdose: Your breathing is slower or more shallow than normal. You have a very slow heartbeat (pulse). You have slurred speech. You have nausea and  vomiting. Your pupils become very small. You have other potential symptoms: You are very confused. You faint or feel like you will faint. You have cold, clammy skin. You have blue lips or fingernails. You have thoughts of harming yourself or harming others. These symptoms may represent a serious problem that is an emergency. Do not wait to see if the symptoms will go away. Get medical help right away. Call your local emergency services (911 in the [place] Do not drive yourself to the hospital.  If you ever feel like you may hurt yourself or others, or have thoughts about taking your own life, get help right away. Go to your nearest  emergency department or: Call your local emergency services (911 in the [place] Call the [facility] (680-336-0411 in the [place] Call a suicide crisis helpline, such as the National Suicide Prevention Lifeline at (224)838-2664 or 988 in the [place] This is open 24 hours a day in the [place] If you're a Veteran: Call 988 and press 1. This is open 24 hours a day. Text the [employer] at 704-883-9969. Summary Opioid medicines can help you manage moderate to severe pain for a short period of time. A pain treatment plan is an agreement between you and your health care provider. Discuss the goals of your treatment, including how much pain you might expect to have and how you will manage the pain. If you think that you or someone else may have taken too much of an opioid, get medical help right away. This information is not intended to replace advice given to you by your health care provider. Make sure you discuss any questions you have with your health care provider. Document Revised: 10/05/2022 Document Reviewed: 04/10/2020 Elsevier Patient Education  2024 [employer]  Advanced directives: (Declined) Advance directive discussed with you today. Even though you declined this today, please call our office should you change your mind, and we can give you the proper paperwork for you to fill out.  Next Medicare Annual Wellness Visit scheduled for next year: Yes

## 2023-02-04 NOTE — Progress Notes (Signed)
Subjective:   Terri Mckee is a 83 y.o. female who presents for Medicare Annual (Subsequent) preventive examination.  Visit Complete: Virtual I connected with  Terri Mckee on 02/04/23 by a audio enabled telemedicine application and verified that I am speaking with the correct person using two identifiers.  Patient Location: Home  Provider Location: Home Office  I discussed the limitations of evaluation and management by telemedicine. The patient expressed understanding and agreed to proceed.  Vital Signs: Because this visit was a virtual/telehealth visit, some criteria may be missing or patient reported. Any vitals not documented were not able to be obtained and vitals that have been documented are patient reported.   Cardiac Risk Factors include: advanced age (>21men, >50 women);hypertension     Objective:    Today's Vitals   02/04/23 1436  Weight: 231 lb (104.8 kg)  Height: 5\' 9"  (1.753 m)   Body mass index is 34.11 kg/m.     02/04/2023    2:43 PM 10/20/2022    3:44 PM 10/08/2022    9:26 AM 01/26/2022   11:14 AM 01/17/2021   11:03 AM 12/28/2019    2:43 PM 02/14/2019    1:10 PM  Advanced Directives  Does Patient Have a Medical Advance Directive? No No No No No No No  Does patient want to make changes to medical advance directive?     No - Patient declined    Would patient like information on creating a medical advance directive? No - Patient declined No - Patient declined No - Patient declined No - Patient declined  No - Patient declined No - Patient declined    Current Medications (verified) Outpatient Encounter Medications as of 02/04/2023  Medication Sig   acetaminophen (TYLENOL) 650 MG CR tablet Take 650 mg by mouth in the morning, at noon, and at bedtime.   albuterol (VENTOLIN HFA) 108 (90 Base) MCG/ACT inhaler Inhale 2 puffs into the lungs every 6 (six) hours as needed for wheezing or shortness of breath.   aspirin 81 MG chewable tablet Chew  1 tablet (81 mg total) by mouth 2 (two) times daily.   Blood Glucose Monitoring Suppl DEVI 1 each by Does not apply route in the morning, at noon, and at bedtime. May substitute to any manufacturer covered by patient's insurance.   calcium carbonate (OS-CAL) 600 MG TABS Take 600 mg by mouth in the morning.   Cholecalciferol (VITAMIN D3 PO) Take 1 tablet by mouth in the morning.   famotidine (PEPCID) 20 MG tablet Take 1 tablet (20 mg total) by mouth 2 (two) times daily.   felodipine (PLENDIL) 10 MG 24 hr tablet TAKE 1 TABLET(10 MG) BY MOUTH DAILY   GARLIC PO Take 1 capsule by mouth in the morning.   Homeopathic Products (LEG CRAMPS SL) Place 3 tablets under the tongue at bedtime.   HYDROcodone-acetaminophen (NORCO/VICODIN) 5-325 MG tablet Take 1-2 tablets by mouth every 6 (six) hours as needed for moderate pain (pain score 4-6).   Mometasone Furo-Formoterol Fum 50-5 MCG/ACT AERO Inhale 2 puffs into the lungs 2 (two) times daily.   Multiple Vitamin (MULTIVITAMIN WITH MINERALS) TABS tablet Take 1 tablet by mouth in the morning.   Olopatadine HCl (PATADAY OP) Place 1 drop into both eyes 2 (two) times daily as needed (allergy/irritated eyes.).   Omega-3 Fatty Acids (OMEGA 3 PO) Take 1 capsule by mouth in the morning and at bedtime. OmegaXL   POTASSIUM PO Take 1 tablet by mouth in the  morning.   Vibegron (GEMTESA) 75 MG TABS Take 75 mg by mouth daily in the afternoon.   XYZAL 5 MG tablet Take 1 tablet (5 mg total) by mouth every evening.   No facility-administered encounter medications on file as of 02/04/2023.    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   Cataract    Cataracts, bilateral 07/25/2015   Cervical cancer screening 07/07/2012   Colon polyps    Elevated serum creatinine    with ACE inhibitor (Normal MRA of renal arteries)   GERD (gastroesophageal reflux disease)    History of cyst of breast    Hoarseness of voice 03/13/2012   Hypertension    Lactose intolerance    causes gas   Medicare annual wellness visit, subsequent 09/02/2014   Muscle spasm of left lower extremity 03/21/2007   Qualifier: Diagnosis of  By: Nena Jordan    Osteoarthritis    Osteopenia 08/17/2014   Preventative health care 04/05/2016   Shortness of breath    Skin lesion 07/10/2012   Urinary incontinence 07/10/2012   Can't always get to bathroom on time   Vitamin D deficiency 04/03/2016   Past Surgical History:  Procedure Laterality Date   APPENDECTOMY     BREAST CYST EXCISION Bilateral    right breast, twice   OVARY SURGERY     right ovary & tube replacement   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   Diabetes Maternal Aunt    Diabetes Maternal Aunt    Seizures Maternal Aunt    Birth defects Maternal Aunt    Lupus Grandchild    Sickle cell anemia Grandchild    Colon cancer Neg Hx    Breast cancer Neg Hx    Social History   Socioeconomic History   Marital status: Married    Spouse name:  Terri Mckee   Number of children: 0   Years of education: Not on file   Highest education level: Not on file  Occupational History   Occupation: retired  Tobacco Use   Smoking status: Former    Current packs/day: 0.00    Types: Cigarettes    Quit date: 01/13/1991    Years since quitting: 32.0   Smokeless tobacco: Never   Tobacco comments:  quit 20 years ago 1/2 ppd x 40 years  Vaping Use   Vaping status: Never Used  Substance and Sexual Activity   Alcohol use: No   Drug use: No   Sexual activity: Never    Comment: lives with husband, no dietary restrictions,   Other Topics Concern   Not on file  Social History Narrative   Former Smoker - quit 30 years ago (1/2 ppd x 40 years) Daughter passed away in Mar 06, 2020 d/t kidney cancer       Social Drivers of Corporate investment banker Strain: Low Risk  (02/04/2023)   Overall Financial Resource Strain (CARDIA)    Difficulty of Paying Living Expenses: Not hard at all  Food Insecurity: No Food Insecurity (02/04/2023)   Hunger Vital Sign    Worried About Running Out of Food in the Last Year: Never true    Ran Out of Food in the Last Year: Never true  Transportation Needs: No Transportation Needs (02/04/2023)   PRAPARE - Administrator, Civil Service (Medical): No    Lack of Transportation (Non-Medical): No  Physical Activity: Sufficiently Active (02/04/2023)   Exercise Vital Sign    Days of Exercise per Week: 5 days    Minutes of Exercise per Session: 30 min  Stress: No Stress Concern Present (02/04/2023)   [employer] of Occupational Health - Occupational Stress Questionnaire    Feeling of Stress : Not at all  Social Connections: Socially Integrated (02/04/2023)   Social Connection and Isolation Panel [NHANES]    Frequency of Communication with Friends and Family: More than three times a week    Frequency of Social Gatherings with Friends and Family: More than three times a week    Attends Religious Services: More than 4  times per year    Active Member of [employer] or Organizations: Yes    Attends Engineer, structural: More than 4 times per year    Marital Status: Married    Tobacco Counseling Counseling given: Not Answered Tobacco comments: quit 20 years ago 1/2 ppd x 40 years   Clinical Intake:  Pre-visit preparation completed: Yes  Pain : No/denies pain     Nutritional Risks: None Diabetes: No  How often do you need to have someone help you when you read instructions, pamphlets, or other written materials from your doctor or pharmacy?: 1 - Never  Interpreter Needed?: No  Information entered by :: Theresa Mulligan LPN   Activities of Daily Living    02/04/2023    2:42 PM 10/20/2022    3:44 PM  In your present state of health, do you have any difficulty performing the following activities:  Hearing? 0 1  Vision? 0 0  Difficulty concentrating or making decisions? 0 0  Walking or climbing stairs? 1   Comment Uses Cane and Walker   Dressing or bathing? 0   Doing errands, shopping? 0   Preparing Food and eating ? N   Using the Toilet? N   In the past six months, have you accidently leaked urine? N   Do you have problems with loss of bowel control? N   Managing your Medications? N   Managing your Finances? N   Housekeeping or managing your Housekeeping? N     Patient Care Team: Bradd Canary, MD as PCP - General (Family Medicine) Pati Gallo, MD as Consulting Physician (Family Medicine) Hilarie Fredrickson, MD as Consulting Physician (Gastroenterology) Alfredo Martinez, MD as Consulting Physician (Urology) Terri Piedra,  Gelene Mink, MD as Consulting Physician (Dermatology) Ranee Gosselin, MD as Consulting Physician (Orthopedic Surgery)  Indicate any recent Medical Services you may have received from other than [facility] providers in the past year (date may be approximate).     Assessment:   This is a routine wellness examination for Kindle.  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 1 1 0 0 0  Number falls in past yr: 0 0 0 0 0  Injury with Fall? 0 0 0 0 0  Risk for fall due to : No Fall Risks  No Fall Risks  Impaired balance/gait;Impaired mobility  Follow up Falls prevention discussed Falls evaluation completed Falls evaluation completed  Falls evaluation completed    MEDICARE RISK AT HOME: Medicare Risk at Home Any stairs in or around the home?: No If so, are there any without handrails?: Yes Home free of loose throw rugs in walkways, pet beds, electrical cords, etc?: Yes Adequate lighting in your home to reduce risk of falls?: Yes Life alert?: No Use of a cane, walker or w/c?: Yes Grab bars in the bathroom?: Yes Shower chair or bench in shower?: Yes Elevated toilet seat or a handicapped toilet?: Yes  TIMED UP AND GO:  Was the test performed?  No    Cognitive Function:    01/26/2022   11:22 AM  MMSE - Mini Mental State Exam  Not completed: Unable to complete        02/04/2023    2:43 PM  6CIT Screen  What Year? 0 points  What month? 0 points  What time? 0 points  Count back from 20 0 points  Months in reverse 0 points  Repeat phrase 0 points  Total Score 0 points    Immunizations Immunization History  Administered Date(s) Administered   PFIZER(Purple Top)SARS-COV-2 Vaccination 01/27/2019, 02/17/2019, 10/18/2019   Pfizer(Comirnaty)Fall Seasonal Vaccine 12 years and older 09/24/2022    Pneumococcal Conjugate-13 09/21/2013   Pneumococcal Polysaccharide-23 10/31/2010, 10/05/2016   Td 07/25/2015    TDAP status: Up to date    Pneumococcal vaccine status: Up to date    Qualifies for Shingles Vaccine? Yes   Zostavax completed No   Shingrix Completed?: No.    Education has been provided regarding the importance of this vaccine. Patient has been advised to call insurance company to determine out of pocket expense if they have not yet received this vaccine. Advised may also receive vaccine at local pharmacy or Health Dept. Verbalized acceptance and understanding.  Screening Tests Health Maintenance  Topic Date Due   Zoster Vaccines- Shingrix (1 of 2) 04/26/2023 (Originally 02/10/1990)   Medicare Annual Wellness (AWV)  02/04/2024   DTaP/Tdap/Td (2 - Tdap) 07/24/2025   Pneumonia Vaccine 19+ Years old  Completed   DEXA SCAN  Completed   HPV VACCINES  Aged Out   INFLUENZA VACCINE  Discontinued   Colonoscopy  Discontinued   COVID-19 Vaccine  Discontinued    Health Maintenance  There are no preventive care reminders to display for this patient.       Bone Density status: Completed 06/18/20. Results reflect: Bone density results: OSTEOPENIA. Repeat every   years.     Additional Screening:    Vision Screening: Recommended annual ophthalmology exams for early detection of glaucoma and other disorders of the eye. Is the patient up to date with their annual eye exam?  Yes  Who is the provider or what is the name of the office in which the patient attends annual eye exams? [facility] If pt is not established with a provider, would they like to be referred to a provider to establish care? No .   Dental Screening: Recommended annual dental exams for proper oral hygiene    Community Resource Referral / Chronic Care Management:  CRR required this visit?  No   CCM required this visit?  No     Plan:     I have personally reviewed and noted the following  in the patient's chart:   Medical and social history Use of alcohol, tobacco or illicit drugs  Current medications and supplements including opioid prescriptions. Patient is currently taking opioid prescriptions. Information provided to patient regarding non-opioid alternatives. Patient advised to discuss non-opioid treatment plan with their provider. Functional ability and status Nutritional status Physical activity Advanced directives List of other physicians Hospitalizations, surgeries, and ER visits in previous 12 months Vitals Screenings to include cognitive, depression, and falls Referrals and appointments  In addition, I have reviewed and discussed with patient certain preventive protocols, quality metrics, and best practice recommendations. A written personalized care plan for preventive services as well as general preventive health recommendations were provided to patient.     Tillie Rung, LPN   06/28/735   After Visit Summary: (MyChart) Due to this being a telephonic visit, the after visit summary with patients personalized plan was offered to patient via MyChart   Nurse Notes: None

## 2023-03-03 ENCOUNTER — Ambulatory Visit: Payer: Medicare Other | Admitting: Orthopaedic Surgery

## 2023-03-10 ENCOUNTER — Other Ambulatory Visit (INDEPENDENT_AMBULATORY_CARE_PROVIDER_SITE_OTHER): Payer: Self-pay

## 2023-03-10 ENCOUNTER — Other Ambulatory Visit: Payer: Self-pay

## 2023-03-10 ENCOUNTER — Encounter: Payer: Self-pay | Admitting: Orthopaedic Surgery

## 2023-03-10 ENCOUNTER — Ambulatory Visit: Payer: Medicare Other | Admitting: Orthopaedic Surgery

## 2023-03-10 DIAGNOSIS — R29898 Other symptoms and signs involving the musculoskeletal system: Secondary | ICD-10-CM

## 2023-03-10 DIAGNOSIS — Z96641 Presence of right artificial hip joint: Secondary | ICD-10-CM

## 2023-03-10 NOTE — Progress Notes (Signed)
 The patient is well-known to [place].  She is now 4 months status post a right total hip arthroplasty.  She does have known arthritis in her left hip but is asymptomatic with that left hip.  She reports bilateral knee weakness and says when she first gets up both knees are weak and she has a hard time walking until she can walk it off a little bit.  She does ambulate with a cane.  She says her right hip is doing well.  She says her knees and legs just feel weak.  She has had x-rays of both her knees before showing some mild arthritic changes.  Examination of her left hip shows it moves smoothly and fluidly and still remains asymptomatic in spite of her the arthritis.  Her right operative hip moves smoothly and fluidly with no blocks to rotation.  Both knees have just slight flexion contractures with some fluid on both knees with effusion.  I do feel that she would benefit from outpatient physical therapy to strengthen the muscles around both of her knees.  This could help with her gait in general.  She is asking about knee braces but I do think that she would benefit more from physical therapy.  She can always get over-the-counter knee braces or we can provide them here but I would like to try physical therapy for her knees first.  We will work on getting that set up for her and then see her back in 6 weeks to see how she is doing overall.  No x-rays to be needed.

## 2023-03-24 ENCOUNTER — Ambulatory Visit (INDEPENDENT_AMBULATORY_CARE_PROVIDER_SITE_OTHER): Payer: Medicare Other | Admitting: Physical Therapy

## 2023-03-24 ENCOUNTER — Encounter: Payer: Self-pay | Admitting: Physical Therapy

## 2023-03-24 DIAGNOSIS — M79605 Pain in left leg: Secondary | ICD-10-CM | POA: Diagnosis not present

## 2023-03-24 DIAGNOSIS — R2689 Other abnormalities of gait and mobility: Secondary | ICD-10-CM

## 2023-03-24 DIAGNOSIS — M79604 Pain in right leg: Secondary | ICD-10-CM

## 2023-03-24 DIAGNOSIS — M2560 Stiffness of unspecified joint, not elsewhere classified: Secondary | ICD-10-CM | POA: Diagnosis not present

## 2023-03-24 DIAGNOSIS — M6281 Muscle weakness (generalized): Secondary | ICD-10-CM | POA: Diagnosis not present

## 2023-03-24 NOTE — Therapy (Signed)
 OUTPATIENT PHYSICAL THERAPY LOWER EXTREMITY EVALUATION   Patient Name: Terri Mckee MRN: [id number] DOB:[birth year], 83 y.o., female Today's Date: 03/24/2023  END OF SESSION:  PT End of Session - 03/24/23 1559     Visit Number 1    Number of Visits 13    Date for PT Re-Evaluation 05/07/23    Authorization Type [employer] Medicare    Progress Note Due on Visit 10    PT Start Time 1510    PT Stop Time 1600    PT Time Calculation (min) 50 min    Activity Tolerance Patient limited by pain    Behavior During Therapy [facility] for tasks assessed/performed             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   Cataract    Cataracts, bilateral 07/25/2015   Cervical cancer screening 07/07/2012   Colon polyps    Elevated serum creatinine    with ACE inhibitor (Normal MRA of renal arteries)   GERD (gastroesophageal reflux disease)    History of cyst of breast    Hoarseness of voice 03/13/2012   Hypertension    Lactose intolerance    causes gas   Medicare annual wellness visit, subsequent 09/02/2014   Muscle spasm of left lower extremity 03/21/2007   Qualifier: Diagnosis of  By: Nena Jordan    Osteoarthritis    Osteopenia 08/17/2014   Preventative health care 04/05/2016   Shortness of breath    Skin lesion 07/10/2012   Urinary incontinence 07/10/2012   Can't always get to bathroom on time   Vitamin D deficiency 04/03/2016   Past Surgical History:  Procedure Laterality Date   APPENDECTOMY     BREAST CYST EXCISION Bilateral    right breast, twice   OVARY SURGERY     right ovary & tube replacement   TOTAL HIP ARTHROPLASTY Right 10/20/2022   Procedure: RIGHT TOTAL HIP ARTHROPLASTY ANTERIOR APPROACH;  Surgeon: Kathryne Hitch, MD;   Location: [facility] OR;  Service: Orthopedics;  Laterality: Right;  Attempted spinal converted to general   WRIST GANGLION EXCISION     rt   Patient Active Problem List   Diagnosis Date Noted   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   CRP elevated 09/01/2020   Grief at loss of child 09/01/2020   Pain in left knee 04/17/2020   Muscle weakness 03/31/2020   Myalgia 03/31/2020   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   Class 1 obesity with serious comorbidity and body mass index (BMI) of 31.0 to 31.9 in adult 03/16/2018   Muscle cramp, nocturnal 06/14/2017   Preventative health care 04/05/2016   Vitamin D deficiency 04/03/2016   Cataracts, bilateral 07/25/2015   Left shoulder pain 01/29/2015   Breast cancer screening 09/02/2014   Decreased visual acuity 09/02/2014   Hearing loss 09/02/2014   Bilateral hip pain 09/02/2014   Medicare annual wellness visit, subsequent 09/02/2014   Osteopenia 08/17/2014   Right knee pain 09/24/2013   Low back pain 12/03/2012   Skin lesion 07/10/2012   Urinary incontinence 07/10/2012   Cervical cancer screening 07/07/2012   Cough 04/01/2012   Hoarseness of voice 03/13/2012   Cervical radiculopathy 12/03/2011  GERD 09/13/2009   Pain in both knees 12/21/2008   ECZEMA, 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   Hyperlipidemia, mixed 10/28/2007   INSOMNIA 10/28/2007   Allergic rhinitis 07/01/2007   Muscle cramps 03/21/2007   Essential hypertension 08/06/2006   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  She presented to office visit with Dr. Magnus Ivan on 03/10/2023 with c/o BLE weakness especially her knees.  She has trouble with initiating gait.   PERTINENT HISTORY: Right THA 10/20/22, OA, muscle weakness, Myalgia, Vit D deficiency, osteopenia, LBP, HTN, cervical radiculopathy  PAIN:  NPRS scale: both knees - sitting 0/10 first stand up 10/10, after 5 minutes 3/10; back lumbar 10/10 after standing > ~10 minutes Pain location: both knees & thighs, low back Pain description: ache Aggravating factors: standing especially initially & walking Relieving factors: after moving some for knees,  back needs to sit to rest  PRECAUTIONS: Fall  WEIGHT BEARING RESTRICTIONS: No  FALLS:  Has patient fallen in last 6 months? No  LIVING ENVIRONMENT: Lives with: lives with their spouse / elderly using walker Lives in: House single level Stairs: Yes: External: 4 steps; can reach both Has following equipment at home: Single point cane, Walker - 2 wheeled, Environmental consultant - 4 wheeled, Tour manager, and Grab bars  OCCUPATION: retired  PLOF: Independent using cane for limited distances in community  PATIENT GOALS: walk good,   Next MD visit:  OBJECTIVE:  DIAGNOSTIC FINDINGS:   Patient-Specific Activity Scoring Scheme  "0" represents "unable to perform." "10" represents "able to perform at prior level. 0 1 2 3 4 5 6 7 8 9  10 (Date and Score)   Activity Eval     1. Clean house  1    2. Put on shoes   1    3. walking 2   4.getting in / out of car 3   5.    Score 1.75    Total score = sum of the activity scores/number of activities Minimum detectable change (90%CI) for average score = 2 points Minimum detectable change (90%CI) for single activity score = 3 points  COGNITION: Overall cognitive status: WFL    SENSATION: WFL except numbness right lateral hip  MUSCLE LENGTH: Hamstrings  (hips at 90* extending knee): Right -47 deg; Left -44 deg Thomas test: Right -39 deg; Left -35 deg  POSTURE: rounded shoulders, forward head, and flexed trunk   PALPATION: Tenderness over Bil. Quad including quad & patella tendon,  lateral knee R>L,   LOWER EXTREMITY ROM:   ROM Right eval Left eval  Hip flexion    Hip extension Standing A: -12* Standing A: -14*  Hip abduction    Hip adduction    Hip internal rotation    Hip external rotation    Knee flexion Seated A: 109* Seated A: 113*  Knee extension LAQ A: -17* LAQ A: -15*  Ankle dorsiflexion    Ankle plantarflexion    Ankle inversion  Ankle eversion     (Blank rows = not tested)  LOWER EXTREMITY MMT:  MMT Right eval Left eval  Hip flexion    Hip extension Standing 4/5 Standing 4/5  Hip abduction Standing 4/5 Standing 4/5  Hip adduction    Hip internal rotation    Hip external rotation    Knee flexion Standing 4/5 Standing 4/5  Knee extension 4/5 4/5  Ankle dorsiflexion 5/5 5/5  Ankle plantarflexion    Ankle inversion    Ankle eversion     (Blank rows = not tested)  FUNCTIONAL TESTS:  18 inch chair transfer: significant UE use on armrests with multiple attempts. 5 times sit to/from stand: 54.57 sec pt reports no change in BLE pain from 1st to 5th.  TUG with rollator walker: 24.58 sec  GAIT: Distance walked: 150' Assistive device utilized: Environmental consultant - 4 wheeled / rollator walker Level of assistance: SBA /no balance deficits with BUE support of walker but needs cues for deviaitons Comments: gait velocity 1.99 ft/sec  decreased stride length R>L, flexed trunk, hip & knees flexed in stance,    TODAY'S TREATMENT                                                                          DATE: 03/24/2023: Therex: HEP instruction/performance c cues for techniques, handout provided.  Trial set performed of each for comprehension and symptom assessment.  See below for exercise list  PATIENT EDUCATION:   Education details: HEP, POC Person educated: Patient Education method: Explanation, Demonstration, Verbal cues, and Handouts Education comprehension: verbalized understanding, returned demonstration, and verbal cues required  HOME EXERCISE PROGRAM: Access Code: [email or web address] URL: [email or web address] Date: 03/24/2023 Prepared by: Vladimir Faster  Exercises - Standing Hip Abduction Alternating legs  - 1 x daily - 5 x weekly - 1 sets - 10 reps - 2 seconds hold - Standing hip extension alternating legs  - 1 x daily - 5 x weekly - 1 sets - 10 reps - 2 seconds hold - sit to stand to sit with counter  - 1 x daily - 5 x weekly - 2 sets - 5 reps - 5 seconds hold  ASSESSMENT: CLINICAL IMPRESSION: Patient is a 83 y.o. who comes to clinic with complaints of bilateral leg pain especially in knees with mobility, strength and movement coordination deficits that impair their ability to perform usual daily and recreational functional activities without increase difficulty/symptoms at this time.  Patient to benefit from skilled PT services to address impairments and limitations to improve to previous level of function without restriction secondary to condition.   OBJECTIVE IMPAIRMENTS: Abnormal gait, decreased activity tolerance, decreased balance, decreased knowledge of condition, decreased knowledge of use of DME, decreased mobility, difficulty walking, decreased ROM, decreased strength, impaired flexibility, postural dysfunction, obesity, and pain.   ACTIVITY LIMITATIONS: carrying, lifting, bending, standing, squatting, stairs, transfers, and locomotion level  PARTICIPATION LIMITATIONS: community activity  PERSONAL FACTORS: Age, Fitness, Time since onset of injury/illness/exacerbation, and 3+ comorbidities: see PMH  are also affecting patient's functional outcome.   REHAB POTENTIAL: Good  CLINICAL DECISION MAKING: Stable/uncomplicated  EVALUATION COMPLEXITY: Low   GOALS: Goals  reviewed with patient? Yes  SHORT TERM GOALS: (target date for Short term goals 04/16/2023)  1.  Patient will demonstrate independent use of home exercise program to maintain progress from in clinic treatments. Baseline: See objective data Goal status: Initial  2. Patient reports bilateral low extremity pain improved 25% since evaluation Baseline: See objective data Goal status: Initial  LONG TERM GOALS: (target dates for all long term goals  05/07/2023 )   1. Patient will demonstrate/report pain less than or equal to 2/10 to facilitate minimal limitation in daily activity secondary to pain symptoms. Baseline: See objective data Goal status: Initial   2. Patient will demonstrate independent use of home exercise program to facilitate ability to maintain/progress functional gains from skilled physical therapy services. Baseline: See objective data Goal status: Initial  3.  Patient reports Patient-Specific Activity Score improved the average by 3 to indicate improvement in functional activities.  Baseline: SEE OBJECTIVE DATA Goal status: INITIAL   4.  Patient will demonstrate bilateral LE MMT >4/5 throughout to faciltiate usual transfers, stairs, squatting at [facility] for daily life.  Baseline: See objective data Goal status: Initial   5.  Timed Up & Go with rollator walker <13.5 sec Baseline: See objective data Goal status: Initial   6.  5 times sit to/from stand <40 sec Baseline: See objective data Goal status: Initial   7.  Patient ambulates 100' with cane safely modified independent.  Baseline: See objective data Goal status: Initial  PLAN:  PT FREQUENCY:  2x/week  PT DURATION: 6 weeks  PLANNED INTERVENTIONS: 97164- PT Re-evaluation, 97110-Therapeutic exercises, 97530- Therapeutic activity, 97112- Neuromuscular re-education, 97535- Self Care, 16109- Manual therapy, [phone]- Gait training, [phone]- Ionotophoresis 4mg /ml Dexamethasone, Patient/Family education, Balance training,  Stair training, Dry Needling, Joint mobilization, Vestibular training, DME instructions, and Physical Performance Testing  PLAN FOR NEXT SESSION: Review & Update HEP for BLE flexibility and strength.  Functional activities for strength.    Vladimir Faster, PT, DPT 03/24/2023, 4:16 PM

## 2023-04-02 ENCOUNTER — Encounter: Payer: Self-pay | Admitting: Physical Therapy

## 2023-04-02 ENCOUNTER — Ambulatory Visit: Admitting: Physical Therapy

## 2023-04-02 DIAGNOSIS — M79604 Pain in right leg: Secondary | ICD-10-CM

## 2023-04-02 DIAGNOSIS — M2560 Stiffness of unspecified joint, not elsewhere classified: Secondary | ICD-10-CM | POA: Diagnosis not present

## 2023-04-02 DIAGNOSIS — M79605 Pain in left leg: Secondary | ICD-10-CM

## 2023-04-02 DIAGNOSIS — R2689 Other abnormalities of gait and mobility: Secondary | ICD-10-CM | POA: Diagnosis not present

## 2023-04-02 DIAGNOSIS — M6281 Muscle weakness (generalized): Secondary | ICD-10-CM

## 2023-04-02 NOTE — Therapy (Signed)
 OUTPATIENT PHYSICAL THERAPY LOWER EXTREMITY TREATMENT    Patient Name: Terri Mckee MRN: [id number] DOB:1940-08-09, 83 y.o., female Today's Date: 04/02/2023  END OF SESSION:  PT End of Session - 04/02/23 0938     Visit Number 2    Number of Visits 13    Date for PT Re-Evaluation 05/07/23    Authorization Type [employer] Medicare    Progress Note Due on Visit 10    PT Start Time 0931    PT Stop Time 1010    PT Time Calculation (min) 39 min    Activity Tolerance Patient tolerated treatment well    Behavior During Therapy [facility] for tasks assessed/performed;Flat affect              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   Cataract    Cataracts, bilateral 07/25/2015   Cervical cancer screening 07/07/2012   Colon polyps    Elevated serum creatinine    with ACE inhibitor (Normal MRA of renal arteries)   GERD (gastroesophageal reflux disease)    History of cyst of breast    Hoarseness of voice 03/13/2012   Hypertension    Lactose intolerance    causes gas   Medicare annual wellness visit, subsequent 09/02/2014   Muscle spasm of left lower extremity 03/21/2007   Qualifier: Diagnosis of  By: Nena Jordan    Osteoarthritis    Osteopenia 08/17/2014   Preventative health care 04/05/2016   Shortness of breath    Skin lesion 07/10/2012   Urinary incontinence 07/10/2012   Can't always get to bathroom on time   Vitamin D deficiency 04/03/2016   Past Surgical History:  Procedure Laterality Date   APPENDECTOMY     BREAST CYST EXCISION Bilateral    right breast, twice   OVARY SURGERY     right ovary & tube replacement   TOTAL HIP ARTHROPLASTY Right 10/20/2022   Procedure: RIGHT TOTAL HIP ARTHROPLASTY ANTERIOR APPROACH;  Surgeon:  Kathryne Hitch, MD;  Location: [facility] OR;  Service: Orthopedics;  Laterality: Right;  Attempted spinal converted to general   WRIST GANGLION EXCISION     rt   Patient Active Problem List   Diagnosis Date Noted   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   CRP elevated 09/01/2020   Grief at loss of child 09/01/2020   Pain in left knee 04/17/2020   Muscle weakness 03/31/2020   Myalgia 03/31/2020   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   Class 1 obesity with serious comorbidity and body mass index (BMI) of 31.0 to 31.9 in adult 03/16/2018   Muscle cramp, nocturnal 06/14/2017   Preventative health care 04/05/2016   Vitamin D deficiency 04/03/2016   Cataracts, bilateral 07/25/2015   Left shoulder pain 01/29/2015   Breast cancer screening 09/02/2014   Decreased visual acuity 09/02/2014   Hearing loss 09/02/2014   Bilateral hip pain 09/02/2014   Medicare annual wellness visit, subsequent 09/02/2014   Osteopenia 08/17/2014   Right knee pain 09/24/2013   Low back pain 12/03/2012   Skin lesion 07/10/2012   Urinary incontinence 07/10/2012   Cervical cancer screening 07/07/2012   Cough 04/01/2012   Hoarseness of voice 03/13/2012   Cervical  radiculopathy 12/03/2011   GERD 09/13/2009   Pain in both knees 12/21/2008   ECZEMA, 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   Hyperlipidemia, mixed 10/28/2007   INSOMNIA 10/28/2007   Allergic rhinitis 07/01/2007   Muscle cramps 03/21/2007   Essential hypertension 08/06/2006   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     Eval: This 83yo patient underwent a right Total Hip Arthroplasty 10/20/2022 and has history of left hip arthritis but does not appear to need THA.  She presented to office visit with Dr. Magnus Ivan on 03/10/2023 with c/o BLE weakness especially her knees.  She has trouble with initiating gait.   PERTINENT HISTORY: Right THA 10/20/22, OA, muscle weakness, Myalgia, Vit D deficiency, osteopenia, LBP, HTN, cervical radiculopathy  PAIN:  NPRS scale: 3/10 Pain location: both knees & thighs Pain description: aching and sore , little bit of numbness  Aggravating factors: standing especially initially & walking Relieving factors: after moving some for knees, also resting for knees  PRECAUTIONS: Fall  WEIGHT BEARING RESTRICTIONS: No  FALLS:  Has patient fallen in last 6 months? No  LIVING ENVIRONMENT: Lives with: lives with their spouse / elderly using walker Lives in: House single level Stairs: Yes: External: 4 steps; can reach both Has following equipment at home: Single point cane, Walker - 2 wheeled, Environmental consultant - 4 wheeled, Tour manager, and Grab bars  OCCUPATION: retired  PLOF: Independent using cane for limited distances in community  PATIENT GOALS: walk good,   Next MD visit:  OBJECTIVE:  DIAGNOSTIC FINDINGS:   Patient-Specific Activity Scoring Scheme  "0" represents "unable to perform." "10" represents "able to perform at prior level. 0 1 2 3 4 5 6 7 8 9  10 (Date and Score)   Activity Eval     1. Clean house  1    2. Put on shoes   1    3. walking 2   4.getting in / out of car 3   5.    Score 1.75    Total score = sum of the activity scores/number of activities Minimum detectable change (90%CI) for average score = 2 points Minimum detectable change (90%CI) for single activity score = 3 points  COGNITION: Overall cognitive status: WFL    SENSATION: WFL except numbness right lateral hip  MUSCLE  LENGTH: Hamstrings (hips at 90* extending knee): Right -47 deg; Left -44 deg Thomas test: Right -39 deg; Left -35 deg  POSTURE: rounded shoulders, forward head, and flexed trunk   PALPATION: Tenderness over Bil. Quad including quad & patella tendon,  lateral knee R>L,   LOWER EXTREMITY ROM:   ROM Right eval Left eval  Hip flexion    Hip extension Standing A: -12* Standing A: -14*  Hip abduction    Hip adduction    Hip internal rotation    Hip external rotation    Knee flexion Seated A: 109* Seated A: 113*  Knee extension LAQ A: -17* LAQ A: -15*  Ankle dorsiflexion    Ankle plantarflexion    Ankle inversion  Ankle eversion     (Blank rows = not tested)  LOWER EXTREMITY MMT:  MMT Right eval Left eval  Hip flexion    Hip extension Standing 4/5 Standing 4/5  Hip abduction Standing 4/5 Standing 4/5  Hip adduction    Hip internal rotation    Hip external rotation    Knee flexion Standing 4/5 Standing 4/5  Knee extension 4/5 4/5  Ankle dorsiflexion 5/5 5/5  Ankle plantarflexion    Ankle inversion    Ankle eversion     (Blank rows = not tested)  FUNCTIONAL TESTS:  18 inch chair transfer: significant UE use on armrests with multiple attempts. 5 times sit to/from stand: 54.57 sec pt reports no change in BLE pain from 1st to 5th.  TUG with rollator walker: 24.58 sec  GAIT: Distance walked: 150' Assistive device utilized: Environmental consultant - 4 wheeled / rollator walker Level of assistance: SBA /no balance deficits with BUE support of walker but needs cues for deviaitons Comments: gait velocity 1.99 ft/sec  decreased stride length R>L, flexed trunk, hip & knees flexed in stance,    TODAY'S TREATMENT                                                                          DATE:   04/02/23   Nustep L4x8 minutes all four extremities for w/u, tissue perfusion, functional activity tolerance Wide tandem stance 2x30 seconds B EC   Seated LAQs 3# 2x12 B Seated marches  3# 2x12 B Standing HS curls with BUE support 2x12 3# B  Standing hip ABD red TB above knees 2x10 B Standing hip extensions red TB above knees 2x10 B     03/24/2023: Therex: HEP instruction/performance c cues for techniques, handout provided.  Trial set performed of each for comprehension and symptom assessment.  See below for exercise list  PATIENT EDUCATION:  Education details: HEP, POC Person educated: Patient Education method: Explanation, Demonstration, Verbal cues, and Handouts Education comprehension: verbalized understanding, returned demonstration, and verbal cues required  HOME EXERCISE PROGRAM: Access Code: [email or web address] URL: [email or web address] Date: 03/24/2023 Prepared by: Vladimir Faster  Exercises - Standing Hip Abduction Alternating legs  - 1 x daily - 5 x weekly - 1 sets - 10 reps - 2 seconds hold - Standing hip extension alternating legs  - 1 x daily - 5 x weekly - 1 sets - 10 reps - 2 seconds hold - sit to stand to sit with counter  - 1 x daily - 5 x weekly - 2 sets - 5 reps - 5 seconds hold  ASSESSMENT: CLINICAL IMPRESSION:  Pt arrives today doing OK, no major changes since last visit. Focused on functional strength and conditioning with some balance training as time allowed as per POC. Did well, will continue to progress challenges as tolerated.    EVAL: Patient is a 83 y.o. who comes to clinic with complaints of bilateral leg pain especially in knees with mobility, strength and movement coordination deficits that impair their ability to perform usual daily and recreational functional activities without increase difficulty/symptoms at this time.  Patient to benefit from skilled PT services to address impairments and limitations to improve to previous level of function without restriction secondary to condition.  OBJECTIVE IMPAIRMENTS: Abnormal gait, decreased activity tolerance, decreased balance, decreased knowledge of condition, decreased knowledge  of use of DME, decreased mobility, difficulty walking, decreased ROM, decreased strength, impaired flexibility, postural dysfunction, obesity, and pain.   ACTIVITY LIMITATIONS: carrying, lifting, bending, standing, squatting, stairs, transfers, and locomotion level  PARTICIPATION LIMITATIONS: community activity  PERSONAL FACTORS: Age, Fitness, Time since onset of injury/illness/exacerbation, and 3+ comorbidities: see PMH  are also affecting patient's functional outcome.   REHAB POTENTIAL: Good  CLINICAL DECISION MAKING: Stable/uncomplicated  EVALUATION COMPLEXITY: Low   GOALS: Goals reviewed with patient? Yes  SHORT TERM GOALS: (target date for Short term goals 04/16/2023)   1.  Patient will demonstrate independent use of home exercise program to maintain progress from in clinic treatments. Baseline: See objective data Goal status: Initial  2. Patient reports bilateral low extremity pain improved 25% since evaluation Baseline: See objective data Goal status: Initial  LONG TERM GOALS: (target dates for all long term goals  05/07/2023 )   1. Patient will demonstrate/report pain less than or equal to 2/10 to facilitate minimal limitation in daily activity secondary to pain symptoms. Baseline: See objective data Goal status: Initial   2. Patient will demonstrate independent use of home exercise program to facilitate ability to maintain/progress functional gains from skilled physical therapy services. Baseline: See objective data Goal status: Initial  3.  Patient reports Patient-Specific Activity Score improved the average by 3 to indicate improvement in functional activities.  Baseline: SEE OBJECTIVE DATA Goal status: INITIAL   4.  Patient will demonstrate bilateral LE MMT >4/5 throughout to faciltiate usual transfers, stairs, squatting at [facility] for daily life.  Baseline: See objective data Goal status: Initial   5.  Timed Up & Go with rollator walker <13.5 sec Baseline: See  objective data Goal status: Initial   6.  5 times sit to/from stand <40 sec Baseline: See objective data Goal status: Initial   7.  Patient ambulates 100' with cane safely modified independent.  Baseline: See objective data Goal status: Initial  PLAN:  PT FREQUENCY:  2x/week  PT DURATION: 6 weeks  PLANNED INTERVENTIONS: 97164- PT Re-evaluation, 97110-Therapeutic exercises, 97530- Therapeutic activity, 97112- Neuromuscular re-education, 97535- Self Care, 16109- Manual therapy, [phone]- Gait training, [phone]- Ionotophoresis 4mg /ml Dexamethasone, Patient/Family education, Balance training, Stair training, Dry Needling, Joint mobilization, Vestibular training, DME instructions, and Physical Performance Testing  PLAN FOR NEXT SESSION: Review & Update HEP for BLE flexibility and strength.  Functional activities for strength. Balance training.    Nedra Hai, PT, DPT 04/02/23 10:10 AM

## 2023-04-05 ENCOUNTER — Encounter: Payer: Self-pay | Admitting: Rehabilitative and Restorative Service Providers"

## 2023-04-05 ENCOUNTER — Ambulatory Visit: Admitting: Rehabilitative and Restorative Service Providers"

## 2023-04-05 DIAGNOSIS — M79604 Pain in right leg: Secondary | ICD-10-CM

## 2023-04-05 DIAGNOSIS — M6281 Muscle weakness (generalized): Secondary | ICD-10-CM

## 2023-04-05 DIAGNOSIS — R2689 Other abnormalities of gait and mobility: Secondary | ICD-10-CM | POA: Diagnosis not present

## 2023-04-05 DIAGNOSIS — M79605 Pain in left leg: Secondary | ICD-10-CM | POA: Diagnosis not present

## 2023-04-05 NOTE — Therapy (Signed)
 OUTPATIENT PHYSICAL THERAPY  TREATMENT    Patient Name: Terri Mckee MRN: [id number] DOB:[birth year], 83 y.o., female Today's Date: 04/05/2023  END OF SESSION:  PT End of Session - 04/05/23 0941     Visit Number 3    Number of Visits 13    Date for PT Re-Evaluation 05/07/23    Authorization Type BCBS Medicare    Progress Note Due on Visit 10    PT Start Time 0932    PT Stop Time 1011    PT Time Calculation (min) 39 min    Activity Tolerance Patient limited by fatigue;Patient limited by pain    Behavior During Therapy [facility] for tasks assessed/performed               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   Cataract    Cataracts, bilateral 07/25/2015   Cervical cancer screening 07/07/2012   Colon polyps    Elevated serum creatinine    with ACE inhibitor (Normal MRA of renal arteries)   GERD (gastroesophageal reflux disease)    History of cyst of breast    Hoarseness of voice 03/13/2012   Hypertension    Lactose intolerance    causes gas   Medicare annual wellness visit, subsequent 09/02/2014   Muscle spasm of left lower extremity 03/21/2007   Qualifier: Diagnosis of  By: Nena Jordan    Osteoarthritis    Osteopenia 08/17/2014   Preventative health care 04/05/2016   Shortness of breath    Skin lesion 07/10/2012   Urinary incontinence 07/10/2012   Can't always get to bathroom on time   Vitamin D deficiency 04/03/2016   Past Surgical History:  Procedure Laterality Date   APPENDECTOMY     BREAST CYST EXCISION Bilateral    right breast, twice   OVARY SURGERY     right ovary & tube replacement   TOTAL HIP ARTHROPLASTY Right 10/20/2022   Procedure: RIGHT TOTAL HIP ARTHROPLASTY ANTERIOR APPROACH;  Surgeon: Kathryne Hitch, MD;  Location: [facility] OR;  Service: Orthopedics;  Laterality: Right;  Attempted spinal converted to general   WRIST GANGLION EXCISION     rt   Patient Active Problem List   Diagnosis Date Noted   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   CRP elevated 09/01/2020   Grief at loss of child 09/01/2020   Pain in left knee 04/17/2020   Muscle weakness 03/31/2020   Myalgia 03/31/2020   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   Class 1 obesity with serious comorbidity and body mass index (BMI) of 31.0 to 31.9 in adult 03/16/2018   Muscle cramp, nocturnal 06/14/2017   Preventative health care 04/05/2016   Vitamin D deficiency 04/03/2016   Cataracts, bilateral 07/25/2015   Left shoulder pain 01/29/2015   Breast cancer screening 09/02/2014   Decreased visual acuity 09/02/2014   Hearing loss 09/02/2014   Bilateral hip pain 09/02/2014   Medicare annual wellness visit, subsequent 09/02/2014   Osteopenia 08/17/2014   Right knee pain 09/24/2013   Low back pain 12/03/2012   Skin lesion 07/10/2012   Urinary incontinence 07/10/2012   Cervical cancer screening 07/07/2012   Cough 04/01/2012   Hoarseness of voice 03/13/2012  Cervical radiculopathy 12/03/2011   GERD 09/13/2009   Pain in both knees 12/21/2008   ECZEMA, 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   Hyperlipidemia, mixed 10/28/2007   INSOMNIA 10/28/2007   Allergic rhinitis 07/01/2007   Muscle cramps 03/21/2007   Essential hypertension 08/06/2006   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  Reported no symptoms at rest, then reported noticing it with movement and initial steps.    PERTINENT HISTORY: Right THA 10/20/22, OA, muscle weakness, Myalgia, Vit D deficiency, osteopenia, LBP, HTN, cervical radiculopathy  PAIN:  NPRS scale: 3/10 upon arrival  Pain location: both knees & thighs Pain description: aching and sore , little bit of numbness  Aggravating factors: standing especially initially & walking Relieving factors: after moving some for knees, also resting for knees  PRECAUTIONS: Fall  WEIGHT BEARING RESTRICTIONS: No  FALLS:  Has patient fallen in last 6 months? No  LIVING ENVIRONMENT: Lives with: lives with their spouse / elderly using walker Lives in: House single level Stairs: Yes: External: 4 steps; can reach both Has following equipment at home: Single point cane, Walker - 2 wheeled, Environmental consultant - 4 wheeled, Tour manager, and Grab bars  OCCUPATION: retired  PLOF: Independent using cane for limited distances in community  PATIENT GOALS: walk good,   OBJECTIVE:    Patient-Specific Activity Scoring Scheme  "0" represents "unable to perform." "10" represents "able to perform at prior level. 0 1 2 3 4 5 6 7 8 9  10 (Date and Score)   Activity 03/24/2023    1. Clean house  1    2. Put on shoes   1    3. walking 2   4.getting in / out of car 3   5.    Score 1.75    Total score = sum of the activity scores/number of activities Minimum detectable change (90%CI) for average score = 2 points Minimum detectable change (90%CI) for single activity score = 3 points  COGNITION: 03/24/2023 Overall cognitive status: WFL    SENSATION: 03/24/2023 WFL except numbness right lateral hip  MUSCLE LENGTH: 03/24/2023 Hamstrings (hips at 90* extending knee): Right -47 deg; Left -44 deg Thomas test: Right -39 deg; Left -35 deg  POSTURE:  03/24/2023 rounded shoulders, forward head, and flexed trunk   PALPATION: 03/24/2023 Tenderness over Bil.  Quad including quad & patella tendon,  lateral knee R>L,   LOWER EXTREMITY ROM:   ROM Right 03/24/2023 Left 03/24/2023  Hip flexion    Hip extension Standing A: -12* Standing A: -14*  Hip abduction    Hip adduction    Hip internal rotation    Hip external rotation    Knee flexion Seated A: 109* Seated A: 113*  Knee extension LAQ A: -17* LAQ A: -15*  Ankle dorsiflexion    Ankle plantarflexion    Ankle inversion    Ankle eversion     (Blank rows = not tested)  LOWER EXTREMITY MMT:  MMT Right 03/24/2023 Left 03/24/2023  Hip flexion    Hip extension Standing 4/5 Standing 4/5  Hip abduction Standing  4/5 Standing 4/5  Hip adduction    Hip internal rotation    Hip external rotation    Knee flexion Standing 4/5 Standing 4/5  Knee extension 4/5 4/5  Ankle dorsiflexion 5/5 5/5  Ankle plantarflexion    Ankle inversion    Ankle eversion     (Blank rows = not tested)  FUNCTIONAL TESTS:  03/24/2023 18 inch chair transfer: significant UE use on armrests with multiple attempts. 5 times sit to/from stand: 54.57 sec pt reports no change in BLE pain from 1st to 5th.  TUG with rollator walker: 24.58 sec  GAIT: 03/24/2023 Distance walked: 150' Assistive device utilized: Environmental consultant - 4 wheeled / rollator walker Level of assistance: SBA /no balance deficits with BUE support of walker but needs cues for deviaitons Comments: gait velocity 1.99 ft/sec  decreased stride length R>L, flexed trunk, hip & knees flexed in stance,                  TODAY'S TREATMENT                                                                          DATE: 04/05/2023 Therex: Nustep lvl 4 8 mins UE/LE Supine hooklying marching alternating 2 x 5 bilateral  (added to HEP) Supine hooklying clam shell 2 x 10 with isometric hold contralateral leg green band  (added to HEP) Supine bridge 2 x 5 (added to HEP)   Neuro Re-ed: (to improve neural recruitment, stability and control) Supine bilateral quad set 5  sec hold x 10 (added to HEP) Supine glute set 5 sec hold x 1  Feet together stance with head turns x 10 each way with SBA Modified tandem stance (full step forward but not in line) 1 min x 1 bilateral with SBA   TherActivity: Verbal cues and visual demonstration for supine to sit to supine transfers to improve ability and incorporate log rolling and positioning to improve success.  Trial x 1 each direction with extended time due to review.     TODAY'S TREATMENT                                                                          DATE: 04/02/23 Nustep L4x8 minutes all four extremities for w/u, tissue perfusion, functional activity tolerance Wide tandem stance 2x30 seconds B EC   Seated LAQs 3# 2x12 B Seated marches 3# 2x12 B Standing HS curls with BUE support 2x12 3# B  Standing hip ABD red TB above knees 2x10 B Standing hip extensions red TB above knees 2x10 B   TODAY'S TREATMENT  DATE:03/24/2023: Therex: HEP instruction/performance c cues for techniques, handout provided.  Trial set performed of each for comprehension and symptom assessment.  See below for exercise list  PATIENT EDUCATION:  04/05/2023:  Education details: HEP update Person educated: Patient Education method: Programmer, multimedia, Demonstration, Verbal cues, and Handouts Education comprehension: verbalized understanding, returned demonstration, and verbal cues required  HOME EXERCISE PROGRAM: Access Code: [email or web address] URL: [email or web address] Date: 04/05/2023 Prepared by: Chyrel Masson  Exercises - Standing Hip Abduction Alternating legs  - 1 x daily - 5 x weekly - 1 sets - 10 reps - 2 seconds hold - Standing hip extension alternating legs  - 1 x daily - 5 x weekly - 1 sets - 10 reps - 2 seconds hold - sit to stand to sit with counter  - 1 x daily - 5 x weekly - 2 sets - 5 reps - 5 seconds hold - Supine March with Posterior Pelvic  Tilt  - 1-2 x daily - 7 x weekly - 2 sets - 5-10 reps - Supine Bridge with Resistance Band  - 1-2 x daily - 7 x weekly - 1-2 sets - 5-10 reps - 2 hold - Hooklying Isometric Clamshell  - 1-2 x daily - 7 x weekly - 2-3 sets - 10-15 reps - Seated Quad Set  - 2-3 x daily - 7 x weekly - 1 sets - 10 reps - 5 hold  ASSESSMENT: CLINICAL IMPRESSION: Weakness and fatigue evident in bilateral LE with Rt leg more noted than Lt.  Impairments negatively impact functional movement and transfers due to weakness/ pain.  Continued skilled PT services warranted at this time.   OBJECTIVE IMPAIRMENTS: Abnormal gait, decreased activity tolerance, decreased balance, decreased knowledge of condition, decreased knowledge of use of DME, decreased mobility, difficulty walking, decreased ROM, decreased strength, impaired flexibility, postural dysfunction, obesity, and pain.   ACTIVITY LIMITATIONS: carrying, lifting, bending, standing, squatting, stairs, transfers, and locomotion level  PARTICIPATION LIMITATIONS: community activity  PERSONAL FACTORS: Age, Fitness, Time since onset of injury/illness/exacerbation, and 3+ comorbidities: see PMH  are also affecting patient's functional outcome.   REHAB POTENTIAL: Good  CLINICAL DECISION MAKING: Stable/uncomplicated  EVALUATION COMPLEXITY: Low   GOALS: Goals reviewed with patient? Yes  SHORT TERM GOALS: (target date for Short term goals 04/16/2023)   1.  Patient will demonstrate independent use of home exercise program to maintain progress from in clinic treatments. Baseline: See objective data Goal status: on going 04/05/2023  2. Patient reports bilateral low extremity pain improved 25% since evaluation Baseline: See objective data Goal status: on going 04/05/2023  LONG TERM GOALS: (target dates for all long term goals  05/07/2023 )   1. Patient will demonstrate/report pain less than or equal to 2/10 to facilitate minimal limitation in daily activity secondary to  pain symptoms. Baseline: See objective data Goal status: Initial   2. Patient will demonstrate independent use of home exercise program to facilitate ability to maintain/progress functional gains from skilled physical therapy services. Baseline: See objective data Goal status: Initial  3.  Patient reports Patient-Specific Activity Score improved the average by 3 to indicate improvement in functional activities.  Baseline: SEE OBJECTIVE DATA Goal status: INITIAL   4.  Patient will demonstrate bilateral LE MMT >4/5 throughout to faciltiate usual transfers, stairs, squatting at [facility] for daily life.  Baseline: See objective data Goal status: Initial   5.  Timed Up & Go with rollator walker <13.5 sec Baseline: See objective data Goal status: Initial   6.  5 times  sit to/from stand <40 sec Baseline: See objective data Goal status: Initial   7.  Patient ambulates 100' with cane safely modified independent.  Baseline: See objective data Goal status: Initial  PLAN:  PT FREQUENCY:  2x/week  PT DURATION: 6 weeks  PLANNED INTERVENTIONS: 97164- PT Re-evaluation, 97110-Therapeutic exercises, 97530- Therapeutic activity, 97112- Neuromuscular re-education, 97535- Self Care, 01027- Manual therapy, [phone]- Gait training, [phone]- Ionotophoresis 4mg /ml Dexamethasone, Patient/Family education, Balance training, Stair training, Dry Needling, Joint mobilization, Vestibular training, DME instructions, and Physical Performance Testing  PLAN FOR NEXT SESSION: Continued progressive strengthening as able with balance interventions.   Chyrel Masson, PT, DPT, OCS, ATC 04/05/23  10:17 AM

## 2023-04-08 ENCOUNTER — Ambulatory Visit: Admitting: Physical Therapy

## 2023-04-08 ENCOUNTER — Encounter: Payer: Self-pay | Admitting: Physical Therapy

## 2023-04-08 DIAGNOSIS — R2689 Other abnormalities of gait and mobility: Secondary | ICD-10-CM

## 2023-04-08 DIAGNOSIS — M6281 Muscle weakness (generalized): Secondary | ICD-10-CM | POA: Diagnosis not present

## 2023-04-08 DIAGNOSIS — M2560 Stiffness of unspecified joint, not elsewhere classified: Secondary | ICD-10-CM

## 2023-04-08 DIAGNOSIS — M79604 Pain in right leg: Secondary | ICD-10-CM

## 2023-04-08 DIAGNOSIS — M79605 Pain in left leg: Secondary | ICD-10-CM | POA: Diagnosis not present

## 2023-04-08 NOTE — Therapy (Signed)
 OUTPATIENT PHYSICAL THERAPY  TREATMENT    Patient Name: Terri Mckee MRN: [id number] DOB:05-Feb-1940, 83 y.o., female Today's Date: 04/08/2023  END OF SESSION:  PT End of Session - 04/08/23 0928     Visit Number 4    Number of Visits 13    Date for PT Re-Evaluation 05/07/23    Authorization Type BC[employer] Medicare    Progress Note Due on Visit 10    PT Start Time 0928    PT Stop Time 1015    PT Time Calculation (min) 47 min    Activity Tolerance Patient limited by fatigue;Patient limited by pain    Behavior During Therapy [facility] for tasks assessed/performed                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   Cataract    Cataracts, bilateral 07/25/2015   Cervical cancer screening 07/07/2012   Colon polyps    Elevated serum creatinine    with ACE inhibitor (Normal MRA of renal arteries)   GERD (gastroesophageal reflux disease)    History of cyst of breast    Hoarseness of voice 03/13/2012   Hypertension    Lactose intolerance    causes gas   Medicare annual wellness visit, subsequent 09/02/2014   Muscle spasm of left lower extremity 03/21/2007   Qualifier: Diagnosis of  By: Nena Jordan    Osteoarthritis    Osteopenia 08/17/2014   Preventative health care 04/05/2016   Shortness of breath    Skin lesion 07/10/2012   Urinary incontinence 07/10/2012   Can't always get to bathroom on time   Vitamin D deficiency 04/03/2016   Past Surgical History:  Procedure Laterality Date   APPENDECTOMY     BREAST CYST EXCISION Bilateral    right breast, twice   OVARY SURGERY     right ovary & tube replacement   TOTAL HIP ARTHROPLASTY Right 10/20/2022   Procedure: RIGHT TOTAL HIP ARTHROPLASTY ANTERIOR APPROACH;  Surgeon: Kathryne Hitch, MD;  Location: [facility] OR;  Service: Orthopedics;  Laterality: Right;  Attempted spinal converted to general   WRIST GANGLION EXCISION     rt   Patient Active Problem List   Diagnosis Date Noted   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   CRP elevated 09/01/2020   Grief at loss of child 09/01/2020   Pain in left knee 04/17/2020   Muscle weakness 03/31/2020   Myalgia 03/31/2020   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   Class 1 obesity with serious comorbidity and body mass index (BMI) of 31.0 to 31.9 in adult 03/16/2018   Muscle cramp, nocturnal 06/14/2017   Preventative health care 04/05/2016   Vitamin D deficiency 04/03/2016   Cataracts, bilateral 07/25/2015   Left shoulder pain 01/29/2015   Breast cancer screening 09/02/2014   Decreased visual acuity 09/02/2014   Hearing loss 09/02/2014   Bilateral hip pain 09/02/2014   Medicare annual wellness visit, subsequent 09/02/2014   Osteopenia 08/17/2014   Right knee pain 09/24/2013   Low back pain 12/03/2012   Skin lesion 07/10/2012   Urinary incontinence 07/10/2012   Cervical cancer screening 07/07/2012   Cough 04/01/2012   Hoarseness of voice 03/13/2012  Cervical radiculopathy 12/03/2011   GERD 09/13/2009   Pain in both knees 12/21/2008   ECZEMA, 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   Hyperlipidemia, mixed 10/28/2007   INSOMNIA 10/28/2007   Allergic rhinitis 07/01/2007   Muscle cramps 03/21/2007   Essential hypertension 08/06/2006   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   PERTINENT HISTORY: Right THA 10/20/22, OA, muscle weakness, Myalgia, Vit D deficiency, osteopenia, LBP, HTN, cervical radiculopathy  PAIN:  NPRS scale:   4/10 upon arrival, since last PT lowest 3/10 - highest 6/10 Pain location: both knees & thighs Pain description: aching and sore , little bit of numbness  Aggravating factors: standing especially initially & walking Relieving factors: after moving some for knees, also resting for knees   PRECAUTIONS: Fall  WEIGHT BEARING RESTRICTIONS: No  FALLS:  Has patient fallen in last 6 months? No  LIVING ENVIRONMENT: Lives with: lives with their spouse / elderly using walker Lives in: House single level Stairs: Yes: External: 4 steps; can reach both Has following equipment at home: Single point cane, Walker - 2 wheeled, Environmental consultant - 4 wheeled, Tour manager, and Grab bars  OCCUPATION: retired  PLOF: Independent using cane for limited distances in community  PATIENT GOALS: walk good,   OBJECTIVE:    Patient-Specific Activity Scoring Scheme  "0" represents "unable to perform." "10" represents "able to perform at prior level. 0 1 2 3 4 5 6 7 8 9  10 (Date and Score)   Activity 03/24/2023    1. Clean house  1    2. Put on shoes   1    3. walking 2   4.getting in / out of car 3   5.    Score 1.75    Total score = sum of the activity scores/number of activities Minimum detectable change (90%CI) for average score = 2 points Minimum detectable change (90%CI) for single activity score = 3 points  COGNITION: 03/24/2023 Overall cognitive status: WFL    SENSATION: 03/24/2023 WFL except numbness right lateral hip  MUSCLE LENGTH: 03/24/2023 Hamstrings (hips at 90* extending knee): Right -47 deg; Left -44 deg Thomas test: Right -39 deg; Left -35 deg  POSTURE:  03/24/2023 rounded shoulders, forward head, and flexed trunk   PALPATION: 03/24/2023 Tenderness  over Bil. Quad including quad & patella tendon,  lateral knee R>L,   LOWER EXTREMITY ROM:   ROM Right 03/24/2023 Left 03/24/2023  Hip flexion    Hip extension Standing A: -12* Standing A: -14*  Hip abduction    Hip adduction    Hip internal rotation    Hip external rotation    Knee flexion Seated A: 109* Seated A: 113*  Knee extension LAQ A: -17* LAQ A: -15*  Ankle dorsiflexion    Ankle plantarflexion    Ankle inversion    Ankle eversion     (Blank rows = not tested)  LOWER EXTREMITY MMT:  MMT Right 03/24/2023 Left 03/24/2023  Hip flexion    Hip extension  Standing 4/5 Standing 4/5  Hip abduction Standing 4/5 Standing 4/5  Hip adduction    Hip internal rotation    Hip external rotation    Knee flexion Standing 4/5 Standing 4/5  Knee extension 4/5 4/5  Ankle dorsiflexion 5/5 5/5  Ankle plantarflexion    Ankle inversion    Ankle eversion     (Blank rows = not tested)  FUNCTIONAL TESTS:  03/24/2023 18 inch chair transfer: significant UE use on armrests with multiple attempts. 5 times sit to/from stand: 54.57 sec pt reports no change in BLE pain from 1st to 5th.  TUG with rollator walker: 24.58 sec  GAIT: 03/24/2023 Distance walked: 150' Assistive device utilized: Environmental consultant - 4 wheeled / rollator walker Level of assistance: SBA /no balance deficits with BUE support of walker but needs cues for deviaitons Comments: gait velocity 1.99 ft/sec  decreased stride length R>L, flexed trunk, hip & knees flexed in stance,                  TODAY'S TREATMENT                                                                          DATE: 04/08/2023 Therapeutic Exercise: Seated LAQ 3# 5 reps 2 sets with ea LE Seated hamstring curl green theraband 5 reps 2 sets with ea LE PT recommended breaking up HEP into groups by location (bed, standing, chair), doing one group taking 1-2 hour break then another group. So that she does not feel like they are too much and improve  compliance.  PT instructed in need for activity / exercises as sedentary will make her continue to get weaker.  Pt verbalized understanding.   Therapeutic Activities: leg press BLEs 75# 10 reps 2 sets;  PT demo & verbal cues on sit to/from stand technique;  5 reps sit to/from stand 18" chair without armrests using BUEs on seat PT demo & verbal cues on using bar stool for standing ADLs like kitchen.  Pt able to sit to/from stand on 24" 3 reps with intermittent touch //bar.  Pt able to sit to/from stand from 29" bar stool 3 reps and stabilize without assist. PT recommended sitting on 29" bar stool for ADLs that she typically would stand to build strength and endurance.  Pt verbalized understanding.  Neuromuscular Re-education: Standing hip width crossways on balance beam to facilitate hip strategy for balance: eyes open with visual feedback mirror and tactile/manual (intermittent minA) cues for stability - head turns right/mirror/left, up/mirror/right, up-right/mirror/down-left and up-left/mirror/down-right;   static eyes closed 10 sec 3 reps with tactile/manual (intermittent minA) cues for stability   TREATMENT                                                                          DATE: 04/05/2023 Therex: Nustep lvl 4 8 mins UE/LE Supine hooklying marching alternating 2 x 5 bilateral  (added to HEP) Supine hooklying clam shell 2 x 10  with isometric hold contralateral leg green band  (added to HEP) Supine bridge 2 x 5 (added to HEP)   Neuro Re-ed: (to improve neural recruitment, stability and control) Supine bilateral quad set 5 sec hold x 10 (added to HEP) Supine glute set 5 sec hold x 1  Feet together stance with head turns x 10 each way with SBA Modified tandem stance (full step forward but not in line) 1 min x 1 bilateral with SBA   TherActivity: Verbal cues and visual demonstration for supine to sit to supine transfers to improve ability and incorporate log rolling and positioning to  improve success.  Trial x 1 each direction with extended time due to review.     TREATMENT                                                                          DATE: 04/02/23 Nustep L4x8 minutes all four extremities for w/u, tissue perfusion, functional activity tolerance Wide tandem stance 2x30 seconds B EC   Seated LAQs 3# 2x12 B Seated marches 3# 2x12 B Standing HS curls with BUE support 2x12 3# B  Standing hip ABD red TB above knees 2x10 B Standing hip extensions red TB above knees 2x10 B     PATIENT EDUCATION:  04/05/2023:  Education details: HEP update Person educated: Patient Education method: Programmer, multimedia, Demonstration, Verbal cues, and Handouts Education comprehension: verbalized understanding, returned demonstration, and verbal cues required  HOME EXERCISE PROGRAM: Access Code: [email or web address] URL: [email or web address] Date: 04/05/2023 Prepared by: Chyrel Masson  Exercises - Standing Hip Abduction Alternating legs  - 1 x daily - 5 x weekly - 1 sets - 10 reps - 2 seconds hold - Standing hip extension alternating legs  - 1 x daily - 5 x weekly - 1 sets - 10 reps - 2 seconds hold - sit to stand to sit with counter  - 1 x daily - 5 x weekly - 2 sets - 5 reps - 5 seconds hold - Supine March with Posterior Pelvic Tilt  - 1-2 x daily - 7 x weekly - 2 sets - 5-10 reps - Supine Bridge with Resistance Band  - 1-2 x daily - 7 x weekly - 1-2 sets - 5-10 reps - 2 hold - Hooklying Isometric Clamshell  - 1-2 x daily - 7 x weekly - 2-3 sets - 10-15 reps - Seated Quad Set  - 2-3 x daily - 7 x weekly - 1 sets - 10 reps - 5 hold   ASSESSMENT: CLINICAL IMPRESSION: Pt appears to understand HEP recommendations to help improve compliance.  PT progress functional activities including bar stool to build endurance for standing ADLs.   Continued skilled PT services warranted at this time.   OBJECTIVE IMPAIRMENTS: Abnormal gait, decreased activity tolerance, decreased balance,  decreased knowledge of condition, decreased knowledge of use of DME, decreased mobility, difficulty walking, decreased ROM, decreased strength, impaired flexibility, postural dysfunction, obesity, and pain.   ACTIVITY LIMITATIONS: carrying, lifting, bending, standing, squatting, stairs, transfers, and locomotion level  PARTICIPATION LIMITATIONS: community activity  PERSONAL FACTORS: Age, Fitness, Time since onset of injury/illness/exacerbation, and 3+ comorbidities: see PMH  are also affecting patient's functional outcome.   REHAB POTENTIAL: Good  CLINICAL DECISION  MAKING: Stable/uncomplicated  EVALUATION COMPLEXITY: Low   GOALS: Goals reviewed with patient? Yes  SHORT TERM GOALS: (target date for Short term goals 04/16/2023)   1.  Patient will demonstrate independent use of home exercise program to maintain progress from in clinic treatments. Baseline: See objective data Goal status: on going 04/08/2023  2. Patient reports bilateral low extremity pain improved 25% since evaluation Baseline: See objective data Goal status: on going 04/08/2023  LONG TERM GOALS: (target dates for all long term goals  05/07/2023 )   1. Patient will demonstrate/report pain less than or equal to 2/10 to facilitate minimal limitation in daily activity secondary to pain symptoms. Baseline: See objective data Goal status: Ongoing  04/08/2023   2. Patient will demonstrate independent use of home exercise program to facilitate ability to maintain/progress functional gains from skilled physical therapy services. Baseline: See objective data Goal status: Ongoing  04/08/2023  3.  Patient reports Patient-Specific Activity Score improved the average by 3 to indicate improvement in functional activities.  Baseline: SEE OBJECTIVE DATA Goal status: Ongoing  04/08/2023   4.  Patient will demonstrate bilateral LE MMT >4/5 throughout to faciltiate usual transfers, stairs, squatting at [facility] for daily life.  Baseline:  See objective data Goal status: Ongoing  04/08/2023   5.  Timed Up & Go with rollator walker <13.5 sec Baseline: See objective data Goal status: Ongoing  04/08/2023   6.  5 times sit to/from stand <40 sec Baseline: See objective data Goal status: Ongoing  04/08/2023   7.  Patient ambulates 100' with cane safely modified independent.  Baseline: See objective data Goal status: Ongoing  04/08/2023  PLAN:  PT FREQUENCY:  2x/week  PT DURATION: 6 weeks  PLANNED INTERVENTIONS: 97164- PT Re-evaluation, 97110-Therapeutic exercises, 97530- Therapeutic activity, 97112- Neuromuscular re-education, 97535- Self Care, 16109- Manual therapy, [phone]- Gait training, [phone]- Ionotophoresis 4mg /ml Dexamethasone, Patient/Family education, Balance training, Stair training, Dry Needling, Joint mobilization, Vestibular training, DME instructions, and Physical Performance Testing  PLAN FOR NEXT SESSION: check STGs next week including interim PSFS, check if she tried bar stool for kitchen ADLs,     Continued progressive strengthening as able with balance interventions.    Vladimir Faster, PT, DPT 04/08/2023, 10:32 AM

## 2023-04-13 ENCOUNTER — Ambulatory Visit: Admitting: Physical Therapy

## 2023-04-13 ENCOUNTER — Encounter: Payer: Self-pay | Admitting: Physical Therapy

## 2023-04-13 DIAGNOSIS — M79605 Pain in left leg: Secondary | ICD-10-CM | POA: Diagnosis not present

## 2023-04-13 DIAGNOSIS — R2689 Other abnormalities of gait and mobility: Secondary | ICD-10-CM | POA: Diagnosis not present

## 2023-04-13 DIAGNOSIS — M79604 Pain in right leg: Secondary | ICD-10-CM | POA: Diagnosis not present

## 2023-04-13 DIAGNOSIS — M6281 Muscle weakness (generalized): Secondary | ICD-10-CM

## 2023-04-13 DIAGNOSIS — M2560 Stiffness of unspecified joint, not elsewhere classified: Secondary | ICD-10-CM

## 2023-04-13 NOTE — Therapy (Signed)
 OUTPATIENT PHYSICAL THERAPY  TREATMENT    Patient Name: Lalitha Ilyas MRN: [id number] DOB:[birth year], 83 y.o., female Today's Date: 04/13/2023  END OF SESSION:  PT End of Session - 04/13/23 1438     Visit Number 5    Number of Visits 13    Date for PT Re-Evaluation 05/07/23    Authorization Type BC[employer] Medicare    Progress Note Due on Visit 10    PT Start Time 1433    PT Stop Time 1515    PT Time Calculation (min) 42 min    Activity Tolerance Patient limited by fatigue;Patient limited by pain    Behavior During Therapy [facility] for tasks assessed/performed                 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   Cataract    Cataracts, bilateral 07/25/2015   Cervical cancer screening 07/07/2012   Colon polyps    Elevated serum creatinine    with ACE inhibitor (Normal MRA of renal arteries)   GERD (gastroesophageal reflux disease)    History of cyst of breast    Hoarseness of voice 03/13/2012   Hypertension    Lactose intolerance    causes gas   Medicare annual wellness visit, subsequent 09/02/2014   Muscle spasm of left lower extremity 03/21/2007   Qualifier: Diagnosis of  By: Nena Jordan    Osteoarthritis    Osteopenia 08/17/2014   Preventative health care 04/05/2016   Shortness of breath    Skin lesion 07/10/2012   Urinary incontinence 07/10/2012   Can't always get to bathroom on time   Vitamin D deficiency 04/03/2016   Past Surgical History:  Procedure Laterality Date   APPENDECTOMY     BREAST CYST EXCISION Bilateral    right breast, twice   OVARY SURGERY     right ovary & tube replacement   TOTAL HIP ARTHROPLASTY Right 10/20/2022   Procedure: RIGHT TOTAL HIP ARTHROPLASTY ANTERIOR APPROACH;  Surgeon: Kathryne Hitch, MD;  Location: [facility] OR;  Service: Orthopedics;  Laterality: Right;  Attempted spinal converted to general   WRIST GANGLION EXCISION     rt   Patient Active Problem List   Diagnosis Date Noted   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   CRP elevated 09/01/2020   Grief at loss of child 09/01/2020   Pain in left knee 04/17/2020   Muscle weakness 03/31/2020   Myalgia 03/31/2020   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   Class 1 obesity with serious comorbidity and body mass index (BMI) of 31.0 to 31.9 in adult 03/16/2018   Muscle cramp, nocturnal 06/14/2017   Preventative health care 04/05/2016   Vitamin D deficiency 04/03/2016   Cataracts, bilateral 07/25/2015   Left shoulder pain 01/29/2015   Breast cancer screening 09/02/2014   Decreased visual acuity 09/02/2014   Hearing loss 09/02/2014   Bilateral hip pain 09/02/2014   Medicare annual wellness visit, subsequent 09/02/2014   Osteopenia 08/17/2014   Right knee pain 09/24/2013   Low back pain 12/03/2012   Skin lesion 07/10/2012   Urinary incontinence 07/10/2012   Cervical cancer screening 07/07/2012   Cough 04/01/2012   Hoarseness of voice  03/13/2012   Cervical radiculopathy 12/03/2011   GERD 09/13/2009   Pain in both knees 12/21/2008   ECZEMA, 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   Hyperlipidemia, mixed 10/28/2007   INSOMNIA 10/28/2007   Allergic rhinitis 07/01/2007   Muscle cramps 03/21/2007   Essential hypertension 08/06/2006   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 She feels like she can do a little more.  She has not made it back to church yet.   PERTINENT HISTORY: Right THA 10/20/22, OA, muscle weakness, Myalgia, Vit D deficiency, osteopenia, LBP, HTN, cervical radiculopathy  PAIN:  NPRS scale:   3/10 upon arrival, since last PT lowest 3/10 - highest 7/10 Pain location: both knees & thighs Pain description: aching and sore , little bit of numbness  Aggravating factors: standing especially initially & walking Relieving factors: after moving some for knees, also resting for knees   PRECAUTIONS: Fall  WEIGHT BEARING RESTRICTIONS: No  FALLS:  Has patient fallen in last 6 months? No  LIVING ENVIRONMENT: Lives with: lives with their spouse / elderly using walker Lives in: House single level Stairs: Yes: External: 4 steps; can reach both Has following equipment at home: Single point cane, Walker - 2 wheeled, Environmental consultant - 4 wheeled, Tour manager, and Grab bars  OCCUPATION: retired  PLOF: Independent using cane for limited distances in community  PATIENT GOALS: walk good,   OBJECTIVE:    Patient-Specific Activity Scoring Scheme  "0" represents "unable to perform." "10" represents "able to perform at prior level. 0 1 2 3 4 5 6 7 8 9  10 (Date and Score)   Activity 03/24/2023 04/13/23   1. Clean house  1 5   2. Put on shoes   1  4  3. walking 2 8  4.getting in / out of car 3 6  5.    Score 1.75 5.75   Total score = sum of the activity scores/number of activities Minimum detectable change (90%CI) for average score = 2 points Minimum detectable change (90%CI) for single activity score = 3 points  COGNITION: 03/24/2023 Overall cognitive status: WFL    SENSATION: 03/24/2023 WFL except numbness right lateral hip  MUSCLE LENGTH: 03/24/2023 Hamstrings (hips at 90* extending knee): Right -47 deg; Left -44 deg Thomas test: Right -39 deg; Left -35 deg  POSTURE:  03/24/2023 rounded shoulders,  forward head, and flexed trunk   PALPATION: 03/24/2023 Tenderness over Bil. Quad including quad & patella tendon,  lateral knee R>L,   LOWER EXTREMITY ROM:   ROM Right 03/24/2023 Left 03/24/2023  Hip flexion    Hip extension Standing A: -12* Standing A: -14*  Hip abduction    Hip adduction    Hip internal rotation    Hip external rotation    Knee flexion Seated A: 109* Seated A: 113*  Knee extension LAQ A: -17* LAQ A: -15*  Ankle dorsiflexion    Ankle plantarflexion    Ankle inversion    Ankle eversion     (Blank rows = not tested)  LOWER EXTREMITY MMT:  MMT Right 03/24/2023 Left 03/24/2023  Hip flexion    Hip extension Standing 4/5 Standing 4/5  Hip abduction Standing 4/5 Standing 4/5  Hip adduction    Hip internal rotation    Hip external rotation    Knee flexion Standing 4/5 Standing 4/5  Knee extension 4/5 4/5  Ankle dorsiflexion 5/5 5/5  Ankle plantarflexion    Ankle inversion    Ankle eversion     (Blank rows = not tested)  FUNCTIONAL TESTS:  03/24/2023 18 inch chair transfer: significant UE use on armrests with multiple attempts. 5 times sit to/from stand: 54.57 sec pt reports no change in BLE pain from 1st to 5th.  TUG with rollator walker: 24.58 sec  GAIT: 03/24/2023 Distance walked: 150' Assistive device utilized: Environmental consultant - 4 wheeled / rollator walker Level of assistance: SBA /no balance deficits with BUE support of walker but needs cues for deviaitons Comments: gait velocity 1.99 ft/sec  decreased stride length R>L, flexed trunk, hip & knees flexed in stance,                  TODAY'S TREATMENT                                                                          DATE: 04/13/2023 Therapeutic Exercise: Recumbent bike seat 9 rocking for flexion stretch 8 minutes  Therapeutic Activities: PT educated on side to side weight shift upon arising prior to walking.  When pt performed she had less antalgic pattern.  leg press BLEs 81# 10 reps 2  sets; single leg 37# 10 reps 2 sets ea LE Standing with cane support with CGA / minA red theraband stepping flexion, abduction, ext, adduction BLEs 5 reps ea.    Neuromuscular Re-education: Tandem stance on foam beam 30 sec LLE in front & in back intermittent touch Standing hip width crossways on balance beam to facilitate hip strategy for balance: eyes open with visual feedback mirror and tactile/manual (intermittent minA) cues for stability - head turns right/mirror/left, up/mirror/right, up-right/mirror/down-left and up-left/mirror/down-right;   static eyes closed 10 sec 3 reps with tactile/manual (intermittent minA) cues for stability    TREATMENT                                                                          DATE: 04/08/2023 Therapeutic Exercise: Seated LAQ 3# 5 reps 2 sets with ea LE Seated hamstring curl green theraband 5 reps 2 sets with ea LE PT recommended breaking up HEP into groups by location (bed, standing, chair), doing one group taking 1-2 hour break then another group. So that she does not feel like they are too much and improve compliance.  PT instructed in need for activity / exercises as sedentary will make her continue to get weaker.  Pt verbalized understanding.   Therapeutic Activities: leg press BLEs 75# 10 reps 2 sets;  PT demo & verbal cues on sit to/from stand technique;  5 reps sit  to/from stand 18" chair without armrests using BUEs on seat PT demo & verbal cues on using bar stool for standing ADLs like kitchen.  Pt able to sit to/from stand on 24" 3 reps with intermittent touch //bar.  Pt able to sit to/from stand from 29" bar stool 3 reps and stabilize without assist. PT recommended sitting on 29" bar stool for ADLs that she typically would stand to build strength and endurance.  Pt verbalized understanding.  Neuromuscular Re-education: Standing hip width crossways on balance beam to facilitate hip strategy for balance: eyes open with visual feedback  mirror and tactile/manual (intermittent minA) cues for stability - head turns right/mirror/left, up/mirror/right, up-right/mirror/down-left and up-left/mirror/down-right;   static eyes closed 10 sec 3 reps with tactile/manual (intermittent minA) cues for stability   TREATMENT                                                                          DATE: 04/05/2023 Therex: Nustep lvl 4 8 mins UE/LE Supine hooklying marching alternating 2 x 5 bilateral  (added to HEP) Supine hooklying clam shell 2 x 10 with isometric hold contralateral leg green band  (added to HEP) Supine bridge 2 x 5 (added to HEP)   Neuro Re-ed: (to improve neural recruitment, stability and control) Supine bilateral quad set 5 sec hold x 10 (added to HEP) Supine glute set 5 sec hold x 1  Feet together stance with head turns x 10 each way with SBA Modified tandem stance (full step forward but not in line) 1 min x 1 bilateral with SBA   TherActivity: Verbal cues and visual demonstration for supine to sit to supine transfers to improve ability and incorporate log rolling and positioning to improve success.  Trial x 1 each direction with extended time due to review.    PATIENT EDUCATION:  04/05/2023:  Education details: HEP update Person educated: Patient Education method: Solicitor, Verbal cues, and Handouts Education comprehension: verbalized understanding, returned demonstration, and verbal cues required  HOME EXERCISE PROGRAM: Access Code: [email or web address] URL: [email or web address] Date: 04/05/2023 Prepared by: Chyrel Masson  Exercises - Standing Hip Abduction Alternating legs  - 1 x daily - 5 x weekly - 1 sets - 10 reps - 2 seconds hold - Standing hip extension alternating legs  - 1 x daily - 5 x weekly - 1 sets - 10 reps - 2 seconds hold - sit to stand to sit with counter  - 1 x daily - 5 x weekly - 2 sets - 5 reps - 5 seconds hold - Supine March with Posterior Pelvic Tilt  - 1-2 x  daily - 7 x weekly - 2 sets - 5-10 reps - Supine Bridge with Resistance Band  - 1-2 x daily - 7 x weekly - 1-2 sets - 5-10 reps - 2 hold - Hooklying Isometric Clamshell  - 1-2 x daily - 7 x weekly - 2-3 sets - 10-15 reps - Seated Quad Set  - 2-3 x daily - 7 x weekly - 1 sets - 10 reps - 5 hold   ASSESSMENT: CLINICAL IMPRESSION: Pt rated higher patient related activity scale.  Patient is slowly tolerating progressive functional activities.  She continues to struggle with sit to stand  activities.  Continued skilled PT services warranted at this time.   OBJECTIVE IMPAIRMENTS: Abnormal gait, decreased activity tolerance, decreased balance, decreased knowledge of condition, decreased knowledge of use of DME, decreased mobility, difficulty walking, decreased ROM, decreased strength, impaired flexibility, postural dysfunction, obesity, and pain.   ACTIVITY LIMITATIONS: carrying, lifting, bending, standing, squatting, stairs, transfers, and locomotion level  PARTICIPATION LIMITATIONS: community activity  PERSONAL FACTORS: Age, Fitness, Time since onset of injury/illness/exacerbation, and 3+ comorbidities: see PMH  are also affecting patient's functional outcome.   REHAB POTENTIAL: Good  CLINICAL DECISION MAKING: Stable/uncomplicated  EVALUATION COMPLEXITY: Low   GOALS: Goals reviewed with patient? Yes  SHORT TERM GOALS: (target date for Short term goals 04/16/2023)   1.  Patient will demonstrate independent use of home exercise program to maintain progress from in clinic treatments. Baseline: See objective data Goal status: MET 04/13/2023  2. Patient reports bilateral low extremity pain improved 25% since evaluation Baseline: See objective data Goal status: Met 04/13/2023  LONG TERM GOALS: (target dates for all long term goals  05/07/2023 )   1. Patient will demonstrate/report pain less than or equal to 2/10 to facilitate minimal limitation in daily activity secondary to pain  symptoms. Baseline: See objective data Goal status: Ongoing  04/08/2023   2. Patient will demonstrate independent use of home exercise program to facilitate ability to maintain/progress functional gains from skilled physical therapy services. Baseline: See objective data Goal status: Ongoing  04/08/2023  3.  Patient reports Patient-Specific Activity Score improved the average by 3 to indicate improvement in functional activities.  Baseline: SEE OBJECTIVE DATA Goal status: Ongoing  04/08/2023   4.  Patient will demonstrate bilateral LE MMT >4/5 throughout to faciltiate usual transfers, stairs, squatting at [facility] for daily life.  Baseline: See objective data Goal status: Ongoing  04/08/2023   5.  Timed Up & Go with rollator walker <13.5 sec Baseline: See objective data Goal status: Ongoing  04/08/2023   6.  5 times sit to/from stand <40 sec Baseline: See objective data Goal status: Ongoing  04/08/2023   7.  Patient ambulates 100' with cane safely modified independent.  Baseline: See objective data Goal status: Ongoing  04/08/2023  PLAN:  PT FREQUENCY:  2x/week  PT DURATION: 6 weeks  PLANNED INTERVENTIONS: 97164- PT Re-evaluation, 97110-Therapeutic exercises, 97530- Therapeutic activity, 97112- Neuromuscular re-education, 97535- Self Care, 16109- Manual therapy, [phone]- Gait training, [phone]- Ionotophoresis 4mg /ml Dexamethasone, Patient/Family education, Balance training, Stair training, Dry Needling, Joint mobilization, Vestibular training, DME instructions, and Physical Performance Testing  PLAN FOR NEXT SESSION: Work towards LTG's , continued progressive strengthening as able with balance interventions.    Vladimir Faster, PT, DPT 04/13/2023, 4:55 PM

## 2023-04-15 ENCOUNTER — Ambulatory Visit: Admitting: Physical Therapy

## 2023-04-15 ENCOUNTER — Encounter: Payer: Self-pay | Admitting: Physical Therapy

## 2023-04-15 DIAGNOSIS — M79605 Pain in left leg: Secondary | ICD-10-CM

## 2023-04-15 DIAGNOSIS — M79604 Pain in right leg: Secondary | ICD-10-CM | POA: Diagnosis not present

## 2023-04-15 DIAGNOSIS — R2689 Other abnormalities of gait and mobility: Secondary | ICD-10-CM

## 2023-04-15 DIAGNOSIS — M6281 Muscle weakness (generalized): Secondary | ICD-10-CM | POA: Diagnosis not present

## 2023-04-15 DIAGNOSIS — M2560 Stiffness of unspecified joint, not elsewhere classified: Secondary | ICD-10-CM

## 2023-04-15 NOTE — Therapy (Signed)
 OUTPATIENT PHYSICAL THERAPY  TREATMENT    Patient Name: Terri Mckee MRN: [id number] DOB:May 12, 1940, 83 y.o., female Today's Date: 04/15/2023  END OF SESSION:  PT End of Session - 04/15/23 1300     Visit Number 6    Number of Visits 13    Date for PT Re-Evaluation 05/07/23    Authorization Type BCBS Medicare    Progress Note Due on Visit 10    PT Start Time 1300    PT Stop Time 1347    PT Time Calculation (min) 47 min    Activity Tolerance Patient limited by fatigue;Patient limited by pain    Behavior During Therapy [facility] for tasks assessed/performed                  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   Cataract    Cataracts, bilateral 07/25/2015   Cervical cancer screening 07/07/2012   Colon polyps    Elevated serum creatinine    with ACE inhibitor (Normal MRA of renal arteries)   GERD (gastroesophageal reflux disease)    History of cyst of breast    Hoarseness of voice 03/13/2012   Hypertension    Lactose intolerance    causes gas   Medicare annual wellness visit, subsequent 09/02/2014   Muscle spasm of left lower extremity 03/21/2007   Qualifier: Diagnosis of  By: Nena Jordan    Osteoarthritis    Osteopenia 08/17/2014   Preventative health care 04/05/2016   Shortness of breath    Skin lesion 07/10/2012   Urinary incontinence 07/10/2012   Can't always get to bathroom on time   Vitamin D deficiency 04/03/2016   Past Surgical History:  Procedure Laterality Date   APPENDECTOMY     BREAST CYST EXCISION Bilateral    right breast, twice   OVARY SURGERY     right ovary & tube replacement   TOTAL HIP ARTHROPLASTY Right 10/20/2022   Procedure: RIGHT TOTAL HIP ARTHROPLASTY ANTERIOR APPROACH;  Surgeon: Kathryne Hitch, MD;  Location: [facility] OR;  Service: Orthopedics;  Laterality: Right;  Attempted spinal converted to general   WRIST GANGLION EXCISION     rt   Patient Active Problem List   Diagnosis Date Noted   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   CRP elevated 09/01/2020   Grief at loss of child 09/01/2020   Pain in left knee 04/17/2020   Muscle weakness 03/31/2020   Myalgia 03/31/2020   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   Class 1 obesity with serious comorbidity and body mass index (BMI) of 31.0 to 31.9 in adult 03/16/2018   Muscle cramp, nocturnal 06/14/2017   Preventative health care 04/05/2016   Vitamin D deficiency 04/03/2016   Cataracts, bilateral 07/25/2015   Left shoulder pain 01/29/2015   Breast cancer screening 09/02/2014   Decreased visual acuity 09/02/2014   Hearing loss 09/02/2014   Bilateral hip pain 09/02/2014   Medicare annual wellness visit, subsequent 09/02/2014   Osteopenia 08/17/2014   Right knee pain 09/24/2013   Low back pain 12/03/2012   Skin lesion 07/10/2012   Urinary incontinence 07/10/2012   Cervical cancer screening 07/07/2012   Cough 04/01/2012   Hoarseness of  voice 03/13/2012   Cervical radiculopathy 12/03/2011   GERD 09/13/2009   Pain in both knees 12/21/2008   ECZEMA, 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   Hyperlipidemia, mixed 10/28/2007   INSOMNIA 10/28/2007   Allergic rhinitis 07/01/2007   Muscle cramps 03/21/2007   Essential hypertension 08/06/2006   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   PERTINENT HISTORY: Right THA 10/20/22, OA, muscle weakness, Myalgia, Vit D deficiency, osteopenia, LBP, HTN, cervical radiculopathy  PAIN:  NPRS scale:  3/10 upon arrival, since last PT lowest 3/10 - highest 7/10 Pain location: both knees & thighs Pain description: aching and sore , little bit of numbness  Aggravating factors: standing especially initially & walking Relieving factors: after moving some for knees, also resting for knees   PRECAUTIONS: Fall  WEIGHT BEARING RESTRICTIONS: No  FALLS:  Has patient fallen in last 6 months? No  LIVING ENVIRONMENT: Lives with: lives with their spouse / elderly using walker Lives in: House single level Stairs: Yes: External: 4 steps; can reach both Has following equipment at home: Single point cane, Walker - 2 wheeled, Environmental consultant - 4 wheeled, Tour manager, and Grab bars  OCCUPATION: retired  PLOF: Independent using cane for limited distances in community  PATIENT GOALS: walk good,   OBJECTIVE:    Patient-Specific Activity Scoring Scheme  "0" represents "unable to perform." "10" represents "able to perform at prior level. 0 1 2 3 4 5 6 7 8 9  10 (Date and Score)   Activity 03/24/2023 04/13/23   1. Clean house  1 5   2. Put on shoes   1  4  3. walking 2 8  4.getting in / out of car 3 6  5.    Score 1.75 5.75   Total score = sum of the activity scores/number of activities Minimum detectable change (90%CI) for average score = 2 points Minimum detectable change (90%CI) for single activity score = 3 points  COGNITION: 03/24/2023 Overall cognitive status: WFL    SENSATION: 03/24/2023 WFL except numbness right lateral hip  MUSCLE LENGTH: 03/24/2023 Hamstrings (hips at 90* extending knee): Right -47 deg; Left -44 deg Thomas test: Right -39 deg; Left -35 deg  POSTURE:  03/24/2023 rounded shoulders, forward head, and flexed trunk   PALPATION: 03/24/2023 Tenderness over Bil. Quad  including quad & patella tendon,  lateral knee R>L,   LOWER EXTREMITY ROM:   ROM Right 03/24/2023 Left 03/24/2023  Hip flexion    Hip extension Standing A: -12* Standing A: -14*  Hip abduction    Hip adduction    Hip internal rotation    Hip external rotation    Knee flexion Seated A: 109* Seated A: 113*  Knee extension LAQ A: -17* LAQ A: -15*  Ankle dorsiflexion    Ankle plantarflexion    Ankle inversion    Ankle eversion     (Blank rows = not tested)  LOWER EXTREMITY MMT:  MMT Right 03/24/2023 Left 03/24/2023  Hip flexion    Hip extension Standing 4/5 Standing 4/5  Hip abduction Standing 4/5 Standing 4/5  Hip adduction    Hip internal rotation    Hip external rotation    Knee flexion Standing 4/5 Standing 4/5  Knee extension 4/5 4/5  Ankle dorsiflexion 5/5 5/5  Ankle plantarflexion    Ankle inversion    Ankle eversion     (Blank rows = not tested)  FUNCTIONAL TESTS:  03/24/2023 18 inch chair transfer: significant UE use on armrests with multiple attempts. 5 times sit to/from stand: 54.57 sec pt reports no change in BLE pain from 1st to 5th.  TUG with rollator walker: 24.58 sec  GAIT: 03/24/2023 Distance walked: 150' Assistive device utilized: Environmental consultant - 4 wheeled / rollator walker Level of assistance: SBA /no balance deficits with BUE support of walker but needs cues for deviaitons Comments: gait velocity 1.99 ft/sec  decreased stride length R>L, flexed trunk, hip & knees flexed in stance,                  TODAY'S TREATMENT                                                                          DATE: 04/15/2023 Therapeutic Exercise: Recumbent bike seat 9 full revolutions level 1 8 minutes  Therapeutic Activities: Standing with back to doorframe reaching single UEs & BUEs overhead 2 reps ea 2 deep breathes.  PT recommended performing when exiting bathroom throughout her day.  This exercise is trying to facilitate more upright posture.  Pt verbalized  understanding.  leg press BLEs 87# 10 reps 2 sets; single leg 37# 10 reps 2 sets ea LE Standing hip strengthening and balance/stance stability with green Thera-Band, single upper extremity support on chair back: 10 reps each with both LEs SLR flexion, abduction, extension and adduction.  Self-care: PT demo and verbal cues on ice massage to right hip for edema and pain.  Patient verbalized understanding. PT demo and verbal cues on elevation getting BLEs over her heart and performing intermittent ankle activities for muscle use.  PT recommending 10 to 15 minutes 2 times per day.  Patient verbalized understanding   TREATMENT                                                                          DATE: 04/13/2023 Therapeutic Exercise: Recumbent bike seat 9 rocking for flexion stretch 8 minutes  Therapeutic Activities: PT educated on side to side weight shift upon arising prior to walking.  When pt performed she had less antalgic pattern.  leg press BLEs 81# 10 reps 2 sets; single leg 37# 10 reps 2 sets ea LE Standing with cane support with CGA / minA red theraband stepping flexion, abduction, ext, adduction BLEs 5 reps ea.    Neuromuscular Re-education: Tandem stance on foam beam 30 sec LLE in front & in back intermittent touch Standing hip width crossways on balance beam to facilitate hip strategy for balance: eyes open with visual feedback mirror and  tactile/manual (intermittent minA) cues for stability - head turns right/mirror/left, up/mirror/right, up-right/mirror/down-left and up-left/mirror/down-right;   static eyes closed 10 sec 3 reps with tactile/manual (intermittent minA) cues for stability    TREATMENT                                                                          DATE: 04/08/2023 Therapeutic Exercise: Seated LAQ 3# 5 reps 2 sets with ea LE Seated hamstring curl green theraband 5 reps 2 sets with ea LE PT recommended breaking up HEP into groups by location (bed, standing,  chair), doing one group taking 1-2 hour break then another group. So that she does not feel like they are too much and improve compliance.  PT instructed in need for activity / exercises as sedentary will make her continue to get weaker.  Pt verbalized understanding.   Therapeutic Activities: leg press BLEs 75# 10 reps 2 sets;  PT demo & verbal cues on sit to/from stand technique;  5 reps sit to/from stand 18" chair without armrests using BUEs on seat PT demo & verbal cues on using bar stool for standing ADLs like kitchen.  Pt able to sit to/from stand on 24" 3 reps with intermittent touch //bar.  Pt able to sit to/from stand from 29" bar stool 3 reps and stabilize without assist. PT recommended sitting on 29" bar stool for ADLs that she typically would stand to build strength and endurance.  Pt verbalized understanding.  Neuromuscular Re-education: Standing hip width crossways on balance beam to facilitate hip strategy for balance: eyes open with visual feedback mirror and tactile/manual (intermittent minA) cues for stability - head turns right/mirror/left, up/mirror/right, up-right/mirror/down-left and up-left/mirror/down-right;   static eyes closed 10 sec 3 reps with tactile/manual (intermittent minA) cues for stability    PATIENT EDUCATION:  04/05/2023:  Education details: HEP update Person educated: Patient Education method: Programmer, multimedia, Demonstration, Verbal cues, and Handouts Education comprehension: verbalized understanding, returned demonstration, and verbal cues required  HOME EXERCISE PROGRAM: Access Code: [email or web address] URL: [email or web address] Date: 04/05/2023 Prepared by: Chyrel Masson  Exercises - Standing Hip Abduction Alternating legs  - 1 x daily - 5 x weekly - 1 sets - 10 reps - 2 seconds hold - Standing hip extension alternating legs  - 1 x daily - 5 x weekly - 1 sets - 10 reps - 2 seconds hold - sit to stand to sit with counter  - 1 x daily - 5 x weekly - 2  sets - 5 reps - 5 seconds hold - Supine March with Posterior Pelvic Tilt  - 1-2 x daily - 7 x weekly - 2 sets - 5-10 reps - Supine Bridge with Resistance Band  - 1-2 x daily - 7 x weekly - 1-2 sets - 5-10 reps - 2 hold - Hooklying Isometric Clamshell  - 1-2 x daily - 7 x weekly - 2-3 sets - 10-15 reps - Seated Quad Set  - 2-3 x daily - 7 x weekly - 1 sets - 10 reps - 5 hold   ASSESSMENT: CLINICAL IMPRESSION: Patient appears to understand ice massage and elevation that should help with the swelling issues that she was concerned about.  PT progressed hip strengthening exercises to functional standing  utilizing Thera-Band.  Continued skilled PT services warranted at this time.   OBJECTIVE IMPAIRMENTS: Abnormal gait, decreased activity tolerance, decreased balance, decreased knowledge of condition, decreased knowledge of use of DME, decreased mobility, difficulty walking, decreased ROM, decreased strength, impaired flexibility, postural dysfunction, obesity, and pain.   ACTIVITY LIMITATIONS: carrying, lifting, bending, standing, squatting, stairs, transfers, and locomotion level  PARTICIPATION LIMITATIONS: community activity  PERSONAL FACTORS: Age, Fitness, Time since onset of injury/illness/exacerbation, and 3+ comorbidities: see PMH  are also affecting patient's functional outcome.   REHAB POTENTIAL: Good  CLINICAL DECISION MAKING: Stable/uncomplicated  EVALUATION COMPLEXITY: Low   GOALS: Goals reviewed with patient? Yes  SHORT TERM GOALS: (target date for Short term goals 04/16/2023)   1.  Patient will demonstrate independent use of home exercise program to maintain progress from in clinic treatments. Baseline: See objective data Goal status: MET 04/13/2023  2. Patient reports bilateral low extremity pain improved 25% since evaluation Baseline: See objective data Goal status: Met 04/13/2023  LONG TERM GOALS: (target dates for all long term goals  05/07/2023 )   1. Patient will  demonstrate/report pain less than or equal to 2/10 to facilitate minimal limitation in daily activity secondary to pain symptoms. Baseline: See objective data Goal status: Ongoing   04/15/2023   2. Patient will demonstrate independent use of home exercise program to facilitate ability to maintain/progress functional gains from skilled physical therapy services. Baseline: See objective data Goal status: Ongoing    04/15/2023  3.  Patient reports Patient-Specific Activity Score improved the average by 3 to indicate improvement in functional activities.  Baseline: SEE OBJECTIVE DATA Goal status: Ongoing   04/15/2023   4.  Patient will demonstrate bilateral LE MMT >4/5 throughout to faciltiate usual transfers, stairs, squatting at [facility] for daily life.  Baseline: See objective data Goal status: Ongoing    04/15/2023   5.  Timed Up & Go with rollator walker <13.5 sec Baseline: See objective data Goal status: Ongoing   04/15/2023   6.  5 times sit to/from stand <40 sec Baseline: See objective data Goal status: Ongoing  04/15/2023   7.  Patient ambulates 100' with cane safely modified independent.  Baseline: See objective data Goal status: Ongoing  04/15/2023  PLAN:  PT FREQUENCY:  2x/week  PT DURATION: 6 weeks  PLANNED INTERVENTIONS: 97164- PT Re-evaluation, 97110-Therapeutic exercises, 97530- Therapeutic activity, 97112- Neuromuscular re-education, 97535- Self Care, 13086- Manual therapy, [phone]- Gait training, [phone]- Ionotophoresis 4mg /ml Dexamethasone, Patient/Family education, Balance training, Stair training, Dry Needling, Joint mobilization, Vestibular training, DME instructions, and Physical Performance Testing  PLAN FOR NEXT SESSION: Check to see of ice massage and elevation was helpful, check on standing upright at doorframe HEP, continue with standing Thera-Band exercises, tried stairs, work towards LTG's , continued progressive strengthening as able with balance interventions.    Vladimir Faster, PT, DPT 04/15/2023, 1:57 PM

## 2023-04-20 ENCOUNTER — Ambulatory Visit: Admitting: Physical Therapy

## 2023-04-20 ENCOUNTER — Encounter: Payer: Self-pay | Admitting: Physical Therapy

## 2023-04-20 DIAGNOSIS — M6281 Muscle weakness (generalized): Secondary | ICD-10-CM

## 2023-04-20 DIAGNOSIS — M2560 Stiffness of unspecified joint, not elsewhere classified: Secondary | ICD-10-CM

## 2023-04-20 DIAGNOSIS — M79604 Pain in right leg: Secondary | ICD-10-CM | POA: Diagnosis not present

## 2023-04-20 DIAGNOSIS — R2689 Other abnormalities of gait and mobility: Secondary | ICD-10-CM | POA: Diagnosis not present

## 2023-04-20 DIAGNOSIS — M79605 Pain in left leg: Secondary | ICD-10-CM

## 2023-04-20 NOTE — Therapy (Signed)
 OUTPATIENT PHYSICAL THERAPY TREATMENT    Patient Name: Terri Mckee MRN: [id number] DOB:[birth year], 83 y.o., female Today's Date: 04/20/2023  END OF SESSION:  PT End of Session - 04/20/23 1430     Visit Number 7    Number of Visits 13    Date for PT Re-Evaluation 05/07/23    Authorization Type BCBS Medicare    Progress Note Due on Visit 10    PT Start Time 1430    PT Stop Time 1516    PT Time Calculation (min) 46 min    Activity Tolerance Patient limited by fatigue;Patient limited by pain    Behavior During Therapy [facility] for tasks assessed/performed                   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   Cataract    Cataracts, bilateral 07/25/2015   Cervical cancer screening 07/07/2012   Colon polyps    Elevated serum creatinine    with ACE inhibitor (Normal MRA of renal arteries)   GERD (gastroesophageal reflux disease)    History of cyst of breast    Hoarseness of voice 03/13/2012   Hypertension    Lactose intolerance    causes gas   Medicare annual wellness visit, subsequent 09/02/2014   Muscle spasm of left lower extremity 03/21/2007   Qualifier: Diagnosis of  By: Nena Jordan    Osteoarthritis    Osteopenia 08/17/2014   Preventative health care 04/05/2016   Shortness of breath    Skin lesion 07/10/2012   Urinary incontinence 07/10/2012   Can't always get to bathroom on time   Vitamin D deficiency 04/03/2016   Past Surgical History:  Procedure Laterality Date   APPENDECTOMY     BREAST CYST EXCISION Bilateral    right breast, twice   OVARY SURGERY     right ovary & tube replacement   TOTAL HIP ARTHROPLASTY Right 10/20/2022   Procedure: RIGHT TOTAL HIP ARTHROPLASTY ANTERIOR APPROACH;  Surgeon: Kathryne Hitch, MD;  Location: [facility] OR;  Service: Orthopedics;  Laterality: Right;  Attempted spinal converted to general   WRIST GANGLION EXCISION     rt   Patient Active Problem List   Diagnosis Date Noted   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   CRP elevated 09/01/2020   Grief at loss of child 09/01/2020   Pain in left knee 04/17/2020   Muscle weakness 03/31/2020   Myalgia 03/31/2020   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   Class 1 obesity with serious comorbidity and body mass index (BMI) of 31.0 to 31.9 in adult 03/16/2018   Muscle cramp, nocturnal 06/14/2017   Preventative health care 04/05/2016   Vitamin D deficiency 04/03/2016   Cataracts, bilateral 07/25/2015   Left shoulder pain 01/29/2015   Breast cancer screening 09/02/2014   Decreased visual acuity 09/02/2014   Hearing loss 09/02/2014   Bilateral hip pain 09/02/2014   Medicare annual wellness visit, subsequent 09/02/2014   Osteopenia 08/17/2014   Right knee pain 09/24/2013   Low back pain 12/03/2012   Skin lesion 07/10/2012   Urinary incontinence 07/10/2012   Cervical cancer screening 07/07/2012   Cough 04/01/2012   Hoarseness of  voice 03/13/2012   Cervical radiculopathy 12/03/2011   GERD 09/13/2009   Pain in both knees 12/21/2008   ECZEMA, 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   Hyperlipidemia, mixed 10/28/2007   INSOMNIA 10/28/2007   Allergic rhinitis 07/01/2007   Muscle cramps 03/21/2007   Essential hypertension 08/06/2006   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  She tried ice massage and it helped some.  The stretch standing at door frame helped.   PERTINENT HISTORY: Right THA 10/20/22, OA, muscle weakness, Myalgia, Vit D deficiency, osteopenia, LBP, HTN, cervical radiculopathy  PAIN:  NPRS scale:   4/10 upon arrival, since last PT lowest 2/10 - highest 7/10 Pain location: both knees & thighs Pain description: aching and sore , little bit of numbness  Aggravating factors: standing especially initially & walking Relieving factors: after moving some for knees, also resting for knees   PRECAUTIONS: Fall  WEIGHT BEARING RESTRICTIONS: No  FALLS:  Has patient fallen in last 6 months? No  LIVING ENVIRONMENT: Lives with: lives with their spouse / elderly using walker Lives in: House single level Stairs: Yes: External: 4 steps; can reach both Has following equipment at home: Single point cane, Walker - 2 wheeled, Environmental consultant - 4 wheeled, Tour manager, and Grab bars  OCCUPATION: retired  PLOF: Independent using cane for limited distances in community  PATIENT GOALS: walk good,   OBJECTIVE:    Patient-Specific Activity Scoring Scheme  "0" represents "unable to perform." "10" represents "able to perform at prior level. 0 1 2 3 4 5 6 7 8 9  10 (Date and Score)   Activity 03/24/2023 04/13/23   1. Clean house  1 5   2. Put on shoes   1  4  3. walking 2 8  4.getting in / out of car 3 6  5.    Score 1.75 5.75   Total score = sum of the activity scores/number of activities Minimum detectable change (90%CI) for average score = 2 points Minimum detectable change (90%CI) for single activity score = 3 points  COGNITION: 03/24/2023 Overall cognitive status: WFL    SENSATION: 03/24/2023 WFL except numbness right lateral hip  MUSCLE LENGTH: 03/24/2023 Hamstrings (hips at 90* extending knee): Right -47 deg; Left -44 deg Thomas test: Right -39 deg; Left -35 deg  POSTURE:  03/24/2023 rounded shoulders,  forward head, and flexed trunk   PALPATION: 03/24/2023 Tenderness over Bil. Quad including quad & patella tendon,  lateral knee R>L,   LOWER EXTREMITY ROM:   ROM Right 03/24/2023 Left 03/24/2023  Hip flexion    Hip extension Standing A: -12* Standing A: -14*  Hip abduction    Hip adduction    Hip internal rotation    Hip external rotation    Knee flexion Seated A: 109* Seated A: 113*  Knee extension LAQ A: -17* LAQ A: -15*  Ankle dorsiflexion    Ankle plantarflexion    Ankle inversion    Ankle eversion     (Blank rows = not tested)  LOWER EXTREMITY MMT:  MMT Right 03/24/2023 Left 03/24/2023  Hip flexion    Hip extension Standing 4/5 Standing 4/5  Hip abduction Standing 4/5 Standing 4/5  Hip adduction    Hip internal rotation    Hip external rotation    Knee flexion Standing 4/5 Standing 4/5  Knee extension 4/5 4/5  Ankle dorsiflexion 5/5 5/5  Ankle plantarflexion    Ankle inversion    Ankle eversion     (Blank rows = not tested)  FUNCTIONAL TESTS:  03/24/2023 18 inch chair transfer: significant UE use on armrests with multiple attempts. 5 times sit to/from stand: 54.57 sec pt reports no change in BLE pain from 1st to 5th.  TUG with rollator walker: 24.58 sec  GAIT: 04/20/2023: Gait velocity with cane 2.11 ft/sec  03/24/2023 Distance walked: 150' Assistive device utilized: Environmental consultant - 4 wheeled / rollator walker Level of assistance: SBA /no balance deficits with BUE support of walker but needs cues for deviaitons Comments: gait velocity 1.99 ft/sec  decreased stride length R>L, flexed trunk, hip & knees flexed in stance,                  TODAY'S TREATMENT                                                                          DATE: 04/20/2023 Therapeutic Exercise: Recumbent bike seat 9 full revolutions level 1 8 minutes  Therapeutic Activities: Gait velocity with cane 2.11 ft/sec leg press BLEs 87# 10 reps 2 sets; single leg 25# 10 reps 2 sets ea LE  (tried 37# but too heavy today) Stepping over 6" hurdles (4) in //bars leading over with RLE one direction & LLE other direction 3 laps Side stepping over 6" hurdle with BUE on //bar 10 reps Standing hip strengthening and balance/stance stability with green Thera-Band, single upper extremity support on chair back: 10 reps each with both LEs SLR flexion, abduction, extension and adduction.  Self-Care: PT educated pt on shoes. They should provide cushion and support.  PT recommended having [employer] analyze her gait & standing with shoe recommendation.  Pt verbalized understanding.     TREATMENT                                                                          DATE: 04/15/2023 Therapeutic Exercise: Recumbent bike seat 9 full revolutions level 1 8 minutes  Therapeutic Activities: Standing with back to doorframe reaching single UEs & BUEs overhead 2 reps ea 2 deep breathes.  PT recommended performing when exiting bathroom throughout her day.  This exercise is trying to facilitate more upright posture.  Pt verbalized understanding.  leg press BLEs 87# 10 reps 2 sets; single leg 37# 10 reps 2 sets ea LE Standing hip strengthening and balance/stance stability with green Thera-Band, single upper extremity support on chair back: 10 reps each with both LEs SLR flexion, abduction, extension and adduction.  Self-care: PT demo and verbal cues on ice  massage to right hip for edema and pain.  Patient verbalized understanding. PT demo and verbal cues on elevation getting BLEs over her heart and performing intermittent ankle activities for muscle use.  PT recommending 10 to 15 minutes 2 times per day.  Patient verbalized understanding   TREATMENT                                                                          DATE: 04/13/2023 Therapeutic Exercise: Recumbent bike seat 9 rocking for flexion stretch 8 minutes  Therapeutic Activities: PT educated on side to side weight shift upon arising prior  to walking.  When pt performed she had less antalgic pattern.  leg press BLEs 81# 10 reps 2 sets; single leg 37# 10 reps 2 sets ea LE Standing with cane support with CGA / minA red theraband stepping flexion, abduction, ext, adduction BLEs 5 reps ea.    Neuromuscular Re-education: Tandem stance on foam beam 30 sec LLE in front & in back intermittent touch Standing hip width crossways on balance beam to facilitate hip strategy for balance: eyes open with visual feedback mirror and tactile/manual (intermittent minA) cues for stability - head turns right/mirror/left, up/mirror/right, up-right/mirror/down-left and up-left/mirror/down-right;   static eyes closed 10 sec 3 reps with tactile/manual (intermittent minA) cues for stability    PATIENT EDUCATION:  04/05/2023:  Education details: HEP update Person educated: Patient Education method: Programmer, multimedia, Demonstration, Verbal cues, and Handouts Education comprehension: verbalized understanding, returned demonstration, and verbal cues required  HOME EXERCISE PROGRAM: Access Code: [email or web address] URL: [email or web address] Date: 04/05/2023 Prepared by: Chyrel Masson  Exercises - Standing Hip Abduction Alternating legs  - 1 x daily - 5 x weekly - 1 sets - 10 reps - 2 seconds hold - Standing hip extension alternating legs  - 1 x daily - 5 x weekly - 1 sets - 10 reps - 2 seconds hold - sit to stand to sit with counter  - 1 x daily - 5 x weekly - 2 sets - 5 reps - 5 seconds hold - Supine March with Posterior Pelvic Tilt  - 1-2 x daily - 7 x weekly - 2 sets - 5-10 reps - Supine Bridge with Resistance Band  - 1-2 x daily - 7 x weekly - 1-2 sets - 5-10 reps - 2 hold - Hooklying Isometric Clamshell  - 1-2 x daily - 7 x weekly - 2-3 sets - 10-15 reps - Seated Quad Set  - 2-3 x daily - 7 x weekly - 1 sets - 10 reps - 5 hold   ASSESSMENT: CLINICAL IMPRESSION: Patient has improved her functional strength and mobility.  However she continues to  report LE pain (ratings have decreased but fluctuate up to 7-8/10) and swelling in hip.  PT recommending that patient acquire shoes with more support and cushion which verbalized understanding.    OBJECTIVE IMPAIRMENTS: Abnormal gait, decreased activity tolerance, decreased balance, decreased knowledge of condition, decreased knowledge of use of DME, decreased mobility, difficulty walking, decreased ROM, decreased strength, impaired flexibility, postural dysfunction, obesity, and pain.   ACTIVITY LIMITATIONS: carrying, lifting, bending, standing, squatting, stairs, transfers, and locomotion level  PARTICIPATION LIMITATIONS: community activity  PERSONAL FACTORS: Age, Fitness, Time since onset of injury/illness/exacerbation, and  3+ comorbidities: see PMH  are also affecting patient's functional outcome.   REHAB POTENTIAL: Good  CLINICAL DECISION MAKING: Stable/uncomplicated  EVALUATION COMPLEXITY: Low   GOALS: Goals reviewed with patient? Yes  SHORT TERM GOALS: (target date for Short term goals 04/16/2023)   1.  Patient will demonstrate independent use of home exercise program to maintain progress from in clinic treatments. Baseline: See objective data Goal status: MET 04/13/2023  2. Patient reports bilateral low extremity pain improved 25% since evaluation Baseline: See objective data Goal status: Met 04/13/2023  LONG TERM GOALS: (target dates for all long term goals  05/07/2023 )   1. Patient will demonstrate/report pain less than or equal to 2/10 to facilitate minimal limitation in daily activity secondary to pain symptoms. Baseline: See objective data Goal status: Ongoing    04/20/2023   2. Patient will demonstrate independent use of home exercise program to facilitate ability to maintain/progress functional gains from skilled physical therapy services. Baseline: See objective data Goal status: Ongoing     04/20/2023  3.  Patient reports Patient-Specific Activity Score improved the  average by 3 to indicate improvement in functional activities.  Baseline: SEE OBJECTIVE DATA Goal status: Ongoing    04/20/2023   4.  Patient will demonstrate bilateral LE MMT >4/5 throughout to faciltiate usual transfers, stairs, squatting at [facility] for daily life.  Baseline: See objective data Goal status: Ongoing     04/20/2023   5.  Timed Up & Go with rollator walker <13.5 sec Baseline: See objective data Goal status: Ongoing    04/20/2023   6.  5 times sit to/from stand <40 sec Baseline: See objective data Goal status: Ongoing  04/20/2023   7.  Patient ambulates 100' with cane safely modified independent.  Baseline: See objective data Goal status: Ongoing  04/20/2023  PLAN:  PT FREQUENCY:  2x/week  PT DURATION: 6 weeks  PLANNED INTERVENTIONS: 97164- PT Re-evaluation, 97110-Therapeutic exercises, 97530- Therapeutic activity, 97112- Neuromuscular re-education, 97535- Self Care, [id number]- Manual therapy, [phone]- Gait training, [phone]- Ionotophoresis 4mg /ml Dexamethasone, Patient/Family education, Balance training, Stair training, Dry Needling, Joint mobilization, Vestibular training, DME instructions, and Physical Performance Testing  PLAN FOR NEXT SESSION: check doctor's note,  continue with standing Thera-Band exercises, tried stairs, work towards LTG's , continued progressive strengthening as able with balance interventions.    Vladimir Faster, PT, DPT 04/20/2023, 3:26 PM

## 2023-04-21 ENCOUNTER — Encounter: Payer: Self-pay | Admitting: Orthopaedic Surgery

## 2023-04-21 ENCOUNTER — Ambulatory Visit: Payer: Medicare Other | Admitting: Orthopaedic Surgery

## 2023-04-21 DIAGNOSIS — R29898 Other symptoms and signs involving the musculoskeletal system: Secondary | ICD-10-CM

## 2023-04-21 DIAGNOSIS — Z96641 Presence of right artificial hip joint: Secondary | ICD-10-CM

## 2023-04-21 NOTE — Progress Notes (Signed)
 The patient is now 6 months status post a right total hip arthroplasty.  She is 83 years old.  She is significantly affected by her decreased mobility.  We have had her in physical therapy.  She has a weak right hip with hip flexion but also has arthritic changes in her knees.  She still feels like that right hip has swelling deep within her.  I did look at the right hip and on inspection there is no significant swelling of the right hip and there is no redness or warmth.  Incisions healed over nicely.  She does have weak hip flexion.  I gave her reassurance that this should hopefully improve with time.  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  Weight loss would certainly help as well.  From my standpoint there is no other surgical intervention I would recommend.  Will see her back in 3 months and I would like a standing AP pelvis at that visit.

## 2023-04-22 ENCOUNTER — Encounter: Payer: Self-pay | Admitting: Physical Therapy

## 2023-04-22 ENCOUNTER — Ambulatory Visit: Admitting: Physical Therapy

## 2023-04-22 DIAGNOSIS — R2689 Other abnormalities of gait and mobility: Secondary | ICD-10-CM

## 2023-04-22 DIAGNOSIS — M6281 Muscle weakness (generalized): Secondary | ICD-10-CM | POA: Diagnosis not present

## 2023-04-22 DIAGNOSIS — M79605 Pain in left leg: Secondary | ICD-10-CM | POA: Diagnosis not present

## 2023-04-22 DIAGNOSIS — M79604 Pain in right leg: Secondary | ICD-10-CM

## 2023-04-22 DIAGNOSIS — M2560 Stiffness of unspecified joint, not elsewhere classified: Secondary | ICD-10-CM

## 2023-04-22 NOTE — Therapy (Signed)
 OUTPATIENT PHYSICAL THERAPY TREATMENT    Patient Name: Terri Mckee MRN: [id number] DOB:[birth year], 83 y.o., female Today's Date: 04/22/2023  END OF SESSION:  PT End of Session - 04/22/23 1257     Visit Number 8    Number of Visits 13    Date for PT Re-Evaluation 05/07/23    Authorization Type BCBS Medicare    Progress Note Due on Visit 10    PT Start Time 1257    PT Stop Time 1344    PT Time Calculation (min) 47 min    Activity Tolerance Patient limited by fatigue;Patient limited by pain    Behavior During Therapy [facility] for tasks assessed/performed                    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   Cataract    Cataracts, bilateral 07/25/2015   Cervical cancer screening 07/07/2012   Colon polyps    Elevated serum creatinine    with ACE inhibitor (Normal MRA of renal arteries)   GERD (gastroesophageal reflux disease)    History of cyst of breast    Hoarseness of voice 03/13/2012   Hypertension    Lactose intolerance    causes gas   Medicare annual wellness visit, subsequent 09/02/2014   Muscle spasm of left lower extremity 03/21/2007   Qualifier: Diagnosis of  By: Nena Jordan    Osteoarthritis    Osteopenia 08/17/2014   Preventative health care 04/05/2016   Shortness of breath    Skin lesion 07/10/2012   Urinary incontinence 07/10/2012   Can't always get to bathroom on time   Vitamin D deficiency 04/03/2016   Past Surgical History:  Procedure Laterality Date   APPENDECTOMY     BREAST CYST EXCISION Bilateral    right breast, twice   OVARY SURGERY     right ovary & tube replacement   TOTAL HIP ARTHROPLASTY Right 10/20/2022   Procedure: RIGHT TOTAL HIP ARTHROPLASTY ANTERIOR APPROACH;  Surgeon:  Kathryne Hitch, MD;  Location: [facility] OR;  Service: Orthopedics;  Laterality: Right;  Attempted spinal converted to general   WRIST GANGLION EXCISION     rt   Patient Active Problem List   Diagnosis Date Noted   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   CRP elevated 09/01/2020   Grief at loss of child 09/01/2020   Pain in left knee 04/17/2020   Muscle weakness 03/31/2020   Myalgia 03/31/2020   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   Class 1 obesity with serious comorbidity and body mass index (BMI) of 31.0 to 31.9 in adult 03/16/2018   Muscle cramp, nocturnal 06/14/2017   Preventative health care 04/05/2016   Vitamin D deficiency 04/03/2016   Cataracts, bilateral 07/25/2015   Left shoulder pain 01/29/2015   Breast cancer screening 09/02/2014   Decreased visual acuity 09/02/2014   Hearing loss 09/02/2014   Bilateral hip pain 09/02/2014   Medicare annual wellness visit, subsequent 09/02/2014   Osteopenia 08/17/2014   Right knee pain 09/24/2013   Low back pain 12/03/2012   Skin lesion 07/10/2012   Urinary incontinence 07/10/2012   Cervical cancer screening 07/07/2012   Cough 04/01/2012   Hoarseness  of voice 03/13/2012   Cervical radiculopathy 12/03/2011   GERD 09/13/2009   Pain in both knees 12/21/2008   ECZEMA, 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   Hyperlipidemia, mixed 10/28/2007   INSOMNIA 10/28/2007   Allergic rhinitis 07/01/2007   Muscle cramps 03/21/2007   Essential hypertension 08/06/2006   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 Magnus Ivan. He said to continue PT and Dot agrees that it is helping.  Don't use ice but warm water like bath.    PERTINENT HISTORY: Right THA 10/20/22, OA, muscle weakness, Myalgia, Vit D deficiency, osteopenia, LBP, HTN, cervical radiculopathy  PAIN:  NPRS scale:   4/10 upon arrival, since last PT lowest 2/10 - highest 7/10 Pain location: both knees & thighs Pain description: aching and sore , little bit of numbness  Aggravating factors: standing especially initially & walking Relieving factors: after moving some for knees, also resting for knees   PRECAUTIONS: Fall  WEIGHT BEARING RESTRICTIONS: No  FALLS:  Has patient fallen in last 6 months? No  LIVING ENVIRONMENT: Lives with: lives with their spouse / elderly using walker Lives in: House single level Stairs: Yes: External: 4 steps; can reach both Has following equipment at home: Single point cane, Walker - 2 wheeled, Environmental consultant - 4 wheeled, Tour manager, and Grab bars  OCCUPATION: retired  PLOF: Independent using cane for limited distances in community  PATIENT GOALS: walk good,   OBJECTIVE:    Patient-Specific Activity Scoring Scheme  "0" represents "unable to perform." "10" represents "able to perform at prior level. 0 1 2 3 4 5 6 7 8 9  10 (Date and Score)   Activity 03/24/2023 04/13/23   1. Clean house  1 5   2. Put on shoes   1  4  3. walking 2 8  4.getting in / out of car 3 6  5.    Score 1.75 5.75   Total score = sum of the activity scores/number of activities Minimum detectable change (90%CI) for average score = 2 points Minimum detectable change (90%CI) for single activity score = 3 points  COGNITION: 03/24/2023 Overall cognitive status: WFL    SENSATION: 03/24/2023 WFL except numbness right lateral hip  MUSCLE LENGTH: 03/24/2023 Hamstrings (hips at 90* extending knee): Right -47 deg; Left -44 deg Thomas test: Right -39 deg; Left -35 deg  POSTURE:  03/24/2023 rounded  shoulders, forward head, and flexed trunk   PALPATION: 03/24/2023 Tenderness over Bil. Quad including quad & patella tendon,  lateral knee R>L,   LOWER EXTREMITY ROM:   ROM Right 03/24/2023 Left 03/24/2023  Hip flexion    Hip extension Standing A: -12* Standing A: -14*  Hip abduction    Hip adduction    Hip internal rotation    Hip external rotation    Knee flexion Seated A: 109* Seated A: 113*  Knee extension LAQ A: -17* LAQ A: -15*  Ankle dorsiflexion    Ankle plantarflexion    Ankle inversion    Ankle eversion     (Blank rows = not tested)  LOWER EXTREMITY  MMT:  MMT Right 03/24/2023 Left 03/24/2023  Hip flexion    Hip extension Standing 4/5 Standing 4/5  Hip abduction Standing 4/5 Standing 4/5  Hip adduction    Hip internal rotation    Hip external rotation    Knee flexion Standing 4/5 Standing 4/5  Knee extension 4/5 4/5  Ankle dorsiflexion 5/5 5/5  Ankle plantarflexion    Ankle inversion    Ankle eversion     (Blank rows = not tested)  FUNCTIONAL TESTS:  03/24/2023 18 inch chair transfer: significant UE use on armrests with multiple attempts. 5 times sit to/from stand: 54.57 sec pt reports no change in BLE pain from 1st to 5th.  TUG with rollator walker: 24.58 sec  GAIT: 04/20/2023: Gait velocity with cane 2.11 ft/sec  03/24/2023 Distance walked: 150' Assistive device utilized: Environmental consultant - 4 wheeled / rollator walker Level of assistance: SBA /no balance deficits with BUE support of walker but needs cues for deviaitons Comments: gait velocity 1.99 ft/sec  decreased stride length R>L, flexed trunk, hip & knees flexed in stance,                  TODAY'S TREATMENT                                                                          DATE: 04/22/2023 Therapeutic Exercise: Recumbent bike seat 9 full revolutions level 3 8 minutes  Therapeutic Activities: Sit to stand 24" bar stool without UE assist 5 reps; stand to sit with light touch bar stool to  ensure under her  leg press BLEs 93# 10 reps 2 sets; single leg 37# 10 reps 2 sets ea LE Standing hip strengthening and balance/stance stability with green Thera-Band, single upper extremity support on chair back& cane: 10 reps each with both LEs SLR flexion, abduction, extension and adduction. PT added to HEP with HO.  Pt verbalized understanding.  Self-Care: PT educated pt that burns occur related to both temperature and time.  So even low heat (like a hot bath or heating pad) left on too long can cause a burn. Pt verbalized understanding.    TREATMENT                                                                          DATE: 04/20/2023 Therapeutic Exercise: Recumbent bike seat 9 full revolutions level 1 8 minutes  Therapeutic Activities: Gait velocity with cane 2.11 ft/sec leg press BLEs 87# 10 reps 2 sets; single leg 25# 10 reps 2 sets ea LE (tried 37# but too heavy today) Stepping over 6" hurdles (4) in //bars leading over with RLE one direction & LLE other direction 3 laps Side stepping over 6" hurdle with BUE on //bar 10 reps Standing hip strengthening and balance/stance stability with green Thera-Band, single upper extremity support on chair back: 10 reps each with both LEs SLR flexion, abduction, extension and adduction.  Self-Care: PT educated pt on shoes. They should  provide cushion and support.  PT recommended having [employer] analyze her gait & standing with shoe recommendation.  Pt verbalized understanding.     TREATMENT                                                                          DATE: 04/15/2023 Therapeutic Exercise: Recumbent bike seat 9 full revolutions level 1 8 minutes  Therapeutic Activities: Standing with back to doorframe reaching single UEs & BUEs overhead 2 reps ea 2 deep breathes.  PT recommended performing when exiting bathroom throughout her day.  This exercise is trying to facilitate more upright posture.  Pt verbalized understanding.  leg press  BLEs 87# 10 reps 2 sets; single leg 37# 10 reps 2 sets ea LE Standing hip strengthening and balance/stance stability with green Thera-Band, single upper extremity support on chair back: 10 reps each with both LEs SLR flexion, abduction, extension and adduction.  Self-care: PT demo and verbal cues on ice massage to right hip for edema and pain.  Patient verbalized understanding. PT demo and verbal cues on elevation getting BLEs over her heart and performing intermittent ankle activities for muscle use.  PT recommending 10 to 15 minutes 2 times per day.  Patient verbalized understanding    PATIENT EDUCATION:  04/05/2023:  Education details: HEP update Person educated: Patient Education method: Programmer, multimedia, Demonstration, Verbal cues, and Handouts Education comprehension: verbalized understanding, returned demonstration, and verbal cues required  HOME EXERCISE PROGRAM: Access Code: [email or web address] URL: [email or web address] Date: 04/22/2023 Prepared by: Vladimir Faster  Exercises - sit to stand to sit with counter  - 1 x daily - 5 x weekly - 2 sets - 5 reps - 5 seconds hold - Supine March with Posterior Pelvic Tilt  - 1-2 x daily - 7 x weekly - 2 sets - 5-10 reps - Supine Bridge with Resistance Band  - 1-2 x daily - 7 x weekly - 1-2 sets - 5-10 reps - 2 hold - Hooklying Isometric Clamshell  - 1-2 x daily - 7 x weekly - 2-3 sets - 10-15 reps - Seated Quad Set  - 2-3 x daily - 7 x weekly - 1 sets - 10 reps - 5 hold - Standing Hip Flexion with Resistance (Mirrored)  - 1 x daily - 7 x weekly - 1 sets - 10 reps - Standing Hip Adduction with Resistance (Mirrored)  - 1 x daily - 7 x weekly - 1 sets - 10 reps - Standing Hip Extension with Resistance  - 1 x daily - 7 x weekly - 1 sets - 10 reps - Standing Hip Abduction with Theraband Resistance  - 1 x daily - 7 x weekly - 1 sets - 10 reps   ASSESSMENT: CLINICAL IMPRESSION: Patient is improving functional strength slowly.  She appears to  understand updated HEP.     OBJECTIVE IMPAIRMENTS: Abnormal gait, decreased activity tolerance, decreased balance, decreased knowledge of condition, decreased knowledge of use of DME, decreased mobility, difficulty walking, decreased ROM, decreased strength, impaired flexibility, postural dysfunction, obesity, and pain.   ACTIVITY LIMITATIONS: carrying, lifting, bending, standing, squatting, stairs, transfers, and locomotion level  PARTICIPATION LIMITATIONS: community activity  PERSONAL FACTORS: Age, Fitness, Time since onset of injury/illness/exacerbation, and 3+  comorbidities: see PMH  are also affecting patient's functional outcome.   REHAB POTENTIAL: Good  CLINICAL DECISION MAKING: Stable/uncomplicated  EVALUATION COMPLEXITY: Low   GOALS: Goals reviewed with patient? Yes  SHORT TERM GOALS: (target date for Short term goals 04/16/2023)   1.  Patient will demonstrate independent use of home exercise program to maintain progress from in clinic treatments. Baseline: See objective data Goal status: MET 04/13/2023  2. Patient reports bilateral low extremity pain improved 25% since evaluation Baseline: See objective data Goal status: Met 04/13/2023  LONG TERM GOALS: (target dates for all long term goals  05/07/2023 )   1. Patient will demonstrate/report pain less than or equal to 2/10 to facilitate minimal limitation in daily activity secondary to pain symptoms. Baseline: See objective data Goal status: Ongoing    04/20/2023   2. Patient will demonstrate independent use of home exercise program to facilitate ability to maintain/progress functional gains from skilled physical therapy services. Baseline: See objective data Goal status: Ongoing     04/20/2023  3.  Patient reports Patient-Specific Activity Score improved the average by 3 to indicate improvement in functional activities.  Baseline: SEE OBJECTIVE DATA Goal status: Ongoing    04/20/2023   4.  Patient will demonstrate bilateral  LE MMT >4/5 throughout to faciltiate usual transfers, stairs, squatting at [facility] for daily life.  Baseline: See objective data Goal status: Ongoing     04/20/2023   5.  Timed Up & Go with rollator walker <13.5 sec Baseline: See objective data Goal status: Ongoing    04/20/2023   6.  5 times sit to/from stand <40 sec Baseline: See objective data Goal status: Ongoing  04/20/2023   7.  Patient ambulates 100' with cane safely modified independent.  Baseline: See objective data Goal status: Ongoing  04/20/2023  PLAN:  PT FREQUENCY:  2x/week  PT DURATION: 6 weeks  PLANNED INTERVENTIONS: 97164- PT Re-evaluation, 97110-Therapeutic exercises, 97530- Therapeutic activity, 97112- Neuromuscular re-education, 97535- Self Care, 65784- Manual therapy, [phone]- Gait training, [phone]- Ionotophoresis 4mg /ml Dexamethasone, Patient/Family education, Balance training, Stair training, Dry Needling, Joint mobilization, Vestibular training, DME instructions, and Physical Performance Testing  PLAN FOR NEXT SESSION: check HEP standing Thera-Band exercises, work on stairs, work towards LTG's , continued progressive strengthening as able with balance interventions.    Vladimir Faster, PT, DPT 04/22/2023, 1:52 PM

## 2023-05-03 ENCOUNTER — Encounter: Admitting: Physical Therapy

## 2023-05-03 NOTE — Therapy (Deleted)
 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   Cataract    Cataracts, bilateral 07/25/2015   Cervical cancer screening 07/07/2012   Colon polyps    Elevated serum creatinine    with ACE inhibitor (Normal MRA of renal arteries)   GERD (gastroesophageal reflux disease)    History of cyst of breast    Hoarseness of voice 03/13/2012   Hypertension    Lactose intolerance    causes gas   Medicare annual wellness visit, subsequent 09/02/2014   Muscle spasm of left lower extremity 03/21/2007   Qualifier: Diagnosis of  By: Marthe Slain    Osteoarthritis    Osteopenia 08/17/2014   Preventative health care 04/05/2016   Shortness of breath    Skin lesion 07/10/2012   Urinary incontinence 07/10/2012   Can't always get to bathroom on time   Vitamin D  deficiency 04/03/2016   Past Surgical History:  Procedure Laterality Date   APPENDECTOMY     BREAST CYST EXCISION Bilateral    right breast, twice   OVARY SURGERY     right ovary & tube replacement   TOTAL HIP ARTHROPLASTY Right 10/20/2022   Procedure: RIGHT TOTAL HIP ARTHROPLASTY ANTERIOR APPROACH;  Surgeon: Arnie Lao, MD;  Location: [facility] OR;  Service: Orthopedics;  Laterality: Right;  Attempted spinal converted to general   WRIST GANGLION EXCISION     rt   Patient Active Problem List   Diagnosis Date Noted   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   CRP elevated 09/01/2020   Grief at loss of child 09/01/2020   Pain in left knee 04/17/2020   Muscle weakness 03/31/2020   Myalgia 03/31/2020    Insulin  resistance 03/30/2018   Class 1 obesity with serious comorbidity and body mass index (BMI) of 31.0 to 31.9 in adult 03/16/2018   Muscle cramp, nocturnal 06/14/2017   Preventative health care 04/05/2016   Vitamin D  deficiency 04/03/2016   Cataracts, bilateral 07/25/2015   Left shoulder pain 01/29/2015   Breast cancer screening 09/02/2014   Decreased visual acuity 09/02/2014   Hearing loss 09/02/2014   Bilateral hip pain 09/02/2014   Medicare annual wellness visit, subsequent 09/02/2014   Osteopenia 08/17/2014   Right knee pain 09/24/2013   Low back pain 12/03/2012   Skin lesion 07/10/2012   Urinary incontinence 07/10/2012   Cervical cancer screening 07/07/2012   Cough 04/01/2012   Hoarseness of voice 03/13/2012   Cervical radiculopathy 12/03/2011   GERD 09/13/2009   Pain in both knees 12/21/2008   ECZEMA, 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   Hyperlipidemia, mixed 10/28/2007   INSOMNIA 10/28/2007   Allergic rhinitis 07/01/2007   Muscle cramps 03/21/2007   Essential hypertension 08/06/2006   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  Rationale for Evaluation and Treatment: Rehabilitation  ONSET DATE: 03/10/2023 MD  referral to PT  SUBJECTIVE:   SUBJECTIVE STATEMENT: ***  She saw Dr. Lucienne Ryder. He said to continue PT and Dot agrees that it is helping.  Don't use ice but warm water like bath.    PERTINENT HISTORY: Right THA 10/20/22, OA, muscle weakness, Myalgia, Vit D deficiency, osteopenia, LBP, HTN, cervical radiculopathy  PAIN:  NPRS scale:  *** 4/10 upon arrival, since last PT lowest 2/10 - highest 7/10 Pain location: both knees & thighs Pain description: aching and sore , little bit of numbness  Aggravating factors: standing especially initially & walking Relieving factors: after moving some for  knees, also resting for knees   PRECAUTIONS: Fall  WEIGHT BEARING RESTRICTIONS: No  FALLS:  Has patient fallen in last 6 months? No  LIVING ENVIRONMENT: Lives with: lives with their spouse / elderly using walker Lives in: House single level Stairs: Yes: External: 4 steps; can reach both Has following equipment at home: Single point cane, Walker - 2 wheeled, Environmental consultant - 4 wheeled, Tour manager, and Grab bars  OCCUPATION: retired  PLOF: Independent using cane for limited distances in community  PATIENT GOALS: walk good,   OBJECTIVE:    Patient-Specific Activity Scoring Scheme  "0" represents "unable to perform." "10" represents "able to perform at prior level. 0 1 2 3 4 5 6 7 8 9  10 (Date and Score)   Activity 03/24/2023 04/13/23   1. Clean house  1 5   2. Put on shoes   1  4  3. walking 2 8  4.getting in / out of car 3 6  5.    Score 1.75 5.75   Total score = sum of the activity scores/number of activities Minimum detectable change (90%CI) for average score = 2 points Minimum detectable change (90%CI) for single activity score = 3 points  COGNITION: 03/24/2023 Overall cognitive status: WFL    SENSATION: 03/24/2023 WFL except numbness right lateral hip  MUSCLE LENGTH: 03/24/2023 Hamstrings (hips at 90* extending knee): Right -47 deg; Left -44 deg Thomas test: Right -39 deg; Left -35 deg  POSTURE:  03/24/2023 rounded shoulders, forward head, and flexed trunk   PALPATION: 03/24/2023 Tenderness over Bil. Quad including quad & patella tendon,  lateral knee R>L,   LOWER EXTREMITY ROM:   ROM Right 03/24/2023 Left 03/24/2023  Hip flexion    Hip extension Standing A: -12* Standing A: -14*  Hip abduction    Hip adduction    Hip internal rotation    Hip external rotation    Knee flexion Seated A: 109* Seated A: 113*  Knee extension LAQ A: -17* LAQ A: -15*  Ankle dorsiflexion    Ankle plantarflexion    Ankle inversion    Ankle eversion     (Blank rows =  not tested)  LOWER EXTREMITY MMT:  MMT Right 03/24/2023 Left 03/24/2023  Hip flexion    Hip extension Standing 4/5 Standing 4/5  Hip abduction Standing 4/5 Standing 4/5  Hip adduction    Hip internal rotation    Hip external rotation    Knee flexion Standing 4/5 Standing 4/5  Knee extension 4/5 4/5  Ankle dorsiflexion 5/5 5/5  Ankle plantarflexion    Ankle inversion    Ankle eversion     (Blank rows = not tested)  FUNCTIONAL TESTS:  03/24/2023 18 inch chair transfer: significant UE use on armrests with multiple attempts. 5 times sit to/from stand: 54.57 sec pt reports no change in BLE pain from 1st to 5th.  TUG with rollator  walker: 24.58 sec  GAIT: 04/20/2023: Gait velocity with cane 2.11 ft/sec  03/24/2023 Distance walked: 150' Assistive device utilized: Environmental consultant - 4 wheeled / rollator walker Level of assistance: SBA /no balance deficits with BUE support of walker but needs cues for deviaitons Comments: gait velocity 1.99 ft/sec  decreased stride length R>L, flexed trunk, hip & knees flexed in stance,                  TODAY'S TREATMENT                                                                          DATE: 05/03/2023 Therapeutic Exercise: Recumbent bike seat 9 full revolutions level 3 8 minutes  Therapeutic Activities: S***   TREATMENT                                                                          DATE: 04/22/2023 Therapeutic Exercise: Recumbent bike seat 9 full revolutions level 3 8 minutes  Therapeutic Activities: Sit to stand 24" bar stool without UE assist 5 reps; stand to sit with light touch bar stool to ensure under her  leg press BLEs 93# 10 reps 2 sets; single leg 37# 10 reps 2 sets ea LE Standing hip strengthening and balance/stance stability with green Thera-Band, single upper extremity support on chair back& cane: 10 reps each with both LEs SLR flexion, abduction, extension and adduction. PT added to HEP with HO.  Pt verbalized  understanding.  Self-Care: PT educated pt that burns occur related to both temperature and time.  So even low heat (like a hot bath or heating pad) left on too long can cause a burn. Pt verbalized understanding.    TREATMENT                                                                          DATE: 04/20/2023 Therapeutic Exercise: Recumbent bike seat 9 full revolutions level 1 8 minutes  Therapeutic Activities: Gait velocity with cane 2.11 ft/sec leg press BLEs 87# 10 reps 2 sets; single leg 25# 10 reps 2 sets ea LE (tried 37# but too heavy today) Stepping over 6" hurdles (4) in //bars leading over with RLE one direction & LLE other direction 3 laps Side stepping over 6" hurdle with BUE on //bar 10 reps Standing hip strengthening and balance/stance stability with green Thera-Band, single upper extremity support on chair back: 10 reps each with both LEs SLR flexion, abduction, extension and adduction.  Self-Care: PT educated pt on shoes. They should provide cushion and support.  PT recommended having [employer] analyze her gait & standing with shoe recommendation.  Pt verbalized understanding.     PATIENT  EDUCATION:  04/05/2023:  Education details: HEP update Person educated: Patient Education method: Programmer, multimedia, Demonstration, Verbal cues, and Handouts Education comprehension: verbalized understanding, returned demonstration, and verbal cues required  HOME EXERCISE PROGRAM: Access Code: [email or web address] URL: [email or web address] Date: 04/22/2023 Prepared by: Lorie Rook  Exercises - sit to stand to sit with counter  - 1 x daily - 5 x weekly - 2 sets - 5 reps - 5 seconds hold - Supine March with Posterior Pelvic Tilt  - 1-2 x daily - 7 x weekly - 2 sets - 5-10 reps - Supine Bridge with Resistance Band  - 1-2 x daily - 7 x weekly - 1-2 sets - 5-10 reps - 2 hold - Hooklying Isometric Clamshell  - 1-2 x daily - 7 x weekly - 2-3 sets - 10-15 reps - Seated Quad Set  - 2-3  x daily - 7 x weekly - 1 sets - 10 reps - 5 hold - Standing Hip Flexion with Resistance (Mirrored)  - 1 x daily - 7 x weekly - 1 sets - 10 reps - Standing Hip Adduction with Resistance (Mirrored)  - 1 x daily - 7 x weekly - 1 sets - 10 reps - Standing Hip Extension with Resistance  - 1 x daily - 7 x weekly - 1 sets - 10 reps - Standing Hip Abduction with Theraband Resistance  - 1 x daily - 7 x weekly - 1 sets - 10 reps   ASSESSMENT: CLINICAL IMPRESSION: ***  Patient is improving functional strength slowly.  She appears to understand updated HEP.     OBJECTIVE IMPAIRMENTS: Abnormal gait, decreased activity tolerance, decreased balance, decreased knowledge of condition, decreased knowledge of use of DME, decreased mobility, difficulty walking, decreased ROM, decreased strength, impaired flexibility, postural dysfunction, obesity, and pain.   ACTIVITY LIMITATIONS: carrying, lifting, bending, standing, squatting, stairs, transfers, and locomotion level  PARTICIPATION LIMITATIONS: community activity  PERSONAL FACTORS: Age, Fitness, Time since onset of injury/illness/exacerbation, and 3+ comorbidities: see PMH  are also affecting patient's functional outcome.   REHAB POTENTIAL: Good  CLINICAL DECISION MAKING: Stable/uncomplicated  EVALUATION COMPLEXITY: Low   GOALS: Goals reviewed with patient? Yes  SHORT TERM GOALS: (target date for Short term goals 04/16/2023)   1.  Patient will demonstrate independent use of home exercise program to maintain progress from in clinic treatments. Baseline: See objective data Goal status: MET 04/13/2023  2. Patient reports bilateral low extremity pain improved 25% since evaluation Baseline: See objective data Goal status: Met 04/13/2023  LONG TERM GOALS: (target dates for all long term goals  05/07/2023 )   1. Patient will demonstrate/report pain less than or equal to 2/10 to facilitate minimal limitation in daily activity secondary to pain  symptoms. Baseline: See objective data Goal status: Ongoing    05/03/2023   2. Patient will demonstrate independent use of home exercise program to facilitate ability to maintain/progress functional gains from skilled physical therapy services. Baseline: See objective data Goal status: Ongoing     05/03/2023  3.  Patient reports Patient-Specific Activity Score improved the average by 3 to indicate improvement in functional activities.  Baseline: SEE OBJECTIVE DATA Goal status: Ongoing    05/03/2023   4.  Patient will demonstrate bilateral LE MMT >4/5 throughout to faciltiate usual transfers, stairs, squatting at [facility] for daily life.  Baseline: See objective data Goal status: Ongoing     05/03/2023   5.  Timed Up & Go with rollator walker <13.5 sec Baseline: See objective data Goal status:  Ongoing    05/03/2023   6.  5 times sit to/from stand <40 sec Baseline: See objective data Goal status: Ongoing  05/03/2023   7.  Patient ambulates 100' with cane safely modified independent.  Baseline: See objective data Goal status: Ongoing  05/03/2023  PLAN:  PT FREQUENCY:  2x/week  PT DURATION: 6 weeks  PLANNED INTERVENTIONS: 97164- PT Re-evaluation, 97110-Therapeutic exercises, 97530- Therapeutic activity, 97112- Neuromuscular re-education, 97535- Self Care, 16109- Manual therapy, [phone]- Gait training, [phone]- Ionotophoresis 4mg /ml Dexamethasone , Patient/Family education, Balance training, Stair training, Dry Needling, Joint mobilization, Vestibular training, DME instructions, and Physical Performance Testing  PLAN FOR NEXT SESSION: ***  check HEP standing Thera-Band exercises, work on stairs, work towards LTG's, continued progressive strengthening as able with balance interventions.    Adiana Smelcer, PT, DPT 05/03/2023, 7:56 AM

## 2023-05-07 ENCOUNTER — Other Ambulatory Visit: Payer: Self-pay | Admitting: Family Medicine

## 2023-05-13 ENCOUNTER — Ambulatory Visit: Admitting: Rehabilitative and Restorative Service Providers"

## 2023-05-13 ENCOUNTER — Encounter: Payer: Self-pay | Admitting: Rehabilitative and Restorative Service Providers"

## 2023-05-13 DIAGNOSIS — M2560 Stiffness of unspecified joint, not elsewhere classified: Secondary | ICD-10-CM

## 2023-05-13 DIAGNOSIS — M79605 Pain in left leg: Secondary | ICD-10-CM | POA: Diagnosis not present

## 2023-05-13 DIAGNOSIS — R2689 Other abnormalities of gait and mobility: Secondary | ICD-10-CM | POA: Diagnosis not present

## 2023-05-13 DIAGNOSIS — M6281 Muscle weakness (generalized): Secondary | ICD-10-CM

## 2023-05-13 DIAGNOSIS — M79604 Pain in right leg: Secondary | ICD-10-CM

## 2023-05-13 NOTE — Therapy (Signed)
 OUTPATIENT PHYSICAL THERAPY TREATMENT / PROGRESS NOTE/ RECERT   Patient Name: Terri Mckee MRN: [id number] DOB:1940-08-27, 83 y.o., female Today's Date: 05/13/2023  Progress Note Reporting Period 03/24/2023 to 05/13/2023  See note below for Objective Data and Assessment of Progress/Goals.    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   Cataract    Cataracts, bilateral 07/25/2015   Cervical cancer screening 07/07/2012   Colon polyps    Elevated serum creatinine    with ACE inhibitor (Normal MRA of renal arteries)   GERD (gastroesophageal reflux disease)    History of cyst of breast    Hoarseness of voice 03/13/2012   Hypertension    Lactose intolerance    causes gas   Medicare annual wellness visit, subsequent 09/02/2014   Muscle spasm of left lower extremity 03/21/2007   Qualifier: Diagnosis of  By: Marthe Slain    Osteoarthritis    Osteopenia 08/17/2014   Preventative health care 04/05/2016   Shortness of breath    Skin lesion 07/10/2012   Urinary incontinence 07/10/2012   Can't always get to bathroom on time   Vitamin D  deficiency 04/03/2016   Past Surgical History:  Procedure Laterality Date   APPENDECTOMY     BREAST CYST EXCISION Bilateral    right breast, twice   OVARY SURGERY     right ovary & tube  replacement   TOTAL HIP ARTHROPLASTY Right 10/20/2022   Procedure: RIGHT TOTAL HIP ARTHROPLASTY ANTERIOR APPROACH;  Surgeon: Arnie Lao, MD;  Location: [facility] OR;  Service: Orthopedics;  Laterality: Right;  Attempted spinal converted to general   WRIST GANGLION EXCISION     rt   Patient Active Problem List   Diagnosis Date Noted   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   CRP elevated 09/01/2020   Grief at loss of child 09/01/2020   Pain in left knee 04/17/2020   Muscle weakness 03/31/2020   Myalgia 03/31/2020   Insulin  resistance 03/30/2018   Class 1 obesity with serious comorbidity and body mass index (BMI) of 31.0 to 31.9 in adult 03/16/2018   Muscle cramp, nocturnal 06/14/2017   Preventative health care 04/05/2016   Vitamin D  deficiency 04/03/2016   Cataracts, bilateral 07/25/2015   Left shoulder pain 01/29/2015   Breast cancer screening 09/02/2014   Decreased visual acuity 09/02/2014   Hearing loss 09/02/2014   Bilateral hip pain 09/02/2014   Medicare annual wellness visit, subsequent 09/02/2014   Osteopenia 08/17/2014   Right knee pain 09/24/2013   Low back pain 12/03/2012  Skin lesion 07/10/2012   Urinary incontinence 07/10/2012   Cervical cancer screening 07/07/2012   Cough 04/01/2012   Hoarseness of voice 03/13/2012   Cervical radiculopathy 12/03/2011   GERD 09/13/2009   Pain in both knees 12/21/2008   ECZEMA, 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   Hyperlipidemia, mixed 10/28/2007   INSOMNIA 10/28/2007   Allergic rhinitis 07/01/2007   Muscle cramps 03/21/2007   Essential hypertension 08/06/2006   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  Pt indicated highest pain number at 4/10 in last week.  Pt indicated about 50% overall improvement to this point.    Pt indicated noting improvement in walking ability, car transfers.  Pt indicated still having main trouble with getting up after sitting prolonged, walking prolonged,   PERTINENT HISTORY: Right THA 10/20/22, OA, muscle weakness, Myalgia, Vit D deficiency, osteopenia, LBP, HTN, cervical radiculopathy  PAIN:  NPRS scale:   up to 4/10 Pain location: Rt knee primary Pain description: 'hurting pain" Aggravating factors: getting up after sitting.  Relieving factors: after moving some for knees, also resting for knees   PRECAUTIONS: Fall  WEIGHT BEARING RESTRICTIONS: No  FALLS:  Has patient fallen in last 6 months? No  LIVING ENVIRONMENT: Lives with: lives with their spouse / elderly using walker Lives in: House single level Stairs: Yes: External: 4 steps; can reach both Has following equipment at home: Single point cane, Walker - 2 wheeled, Environmental consultant - 4 wheeled, Tour manager, and Grab bars  OCCUPATION: retired  PLOF: Independent using cane for limited distances in community  PATIENT GOALS: walk good,   OBJECTIVE:    Patient-Specific Activity Scoring Scheme  "0" represents "unable to perform." "10" represents "able to perform at prior level. 0 1 2 3 4 5 6 7 8 9  10 (Date and Score)   Activity 03/24/2023 04/13/23  05/13/2023  1. Clean house  1 5  5   2. Put on shoes   1  4 7   3. walking 2 8 6   4.getting in / out of car 3 6 5   5.     Score 1.75 5.75 5.75 avg   Total score = sum of the activity scores/number of activities Minimum detectable change (90%CI) for average score = 2 points Minimum detectable change (90%CI) for single activity score = 3  points  COGNITION: 03/24/2023 Overall cognitive status: WFL    SENSATION: 03/24/2023 WFL except numbness right lateral hip  MUSCLE LENGTH: 03/24/2023 Hamstrings (hips at 90* extending knee): Right -47 deg; Left -44 deg Thomas test: Right -39 deg; Left -35 deg  POSTURE:  03/24/2023 rounded shoulders, forward head, and flexed trunk   PALPATION: 03/24/2023 Tenderness over Bil. Quad including quad & patella tendon,  lateral knee R>L,   LOWER EXTREMITY ROM:   ROM Right 03/24/2023 Left 03/24/2023  Hip flexion    Hip extension Standing A: -12* Standing A: -14*  Hip abduction    Hip adduction    Hip internal rotation    Hip external rotation    Knee  flexion Seated A: 109* Seated A: 113*  Knee extension LAQ A: -17* LAQ A: -15*  Ankle dorsiflexion    Ankle plantarflexion    Ankle inversion    Ankle eversion     (Blank rows = not tested)  LOWER EXTREMITY MMT:  MMT Right 03/24/2023 Left 03/24/2023 Right 05/13/2023 Left 05/13/2023  Hip flexion   4/5 5/5  Hip extension Standing 4/5 Standing 4/5    Hip abduction Standing 4/5 Standing 4/5    Hip adduction      Hip internal rotation      Hip external rotation      Knee flexion Standing 4/5 Standing 4/5 5/5 5/5  Knee extension 4/5 4/5 5/5 5/5  Ankle dorsiflexion 5/5 5/5    Ankle plantarflexion      Ankle inversion      Ankle eversion       (Blank rows = not tested)  FUNCTIONAL TESTS:  05/13/2023: TUG with SPC Rt hand: 27.5 seconds  03/24/2023 18 inch chair transfer: significant UE use on armrests with multiple attempts. 5 times sit to/from stand: 54.57 sec pt reports no change in BLE pain from 1st to 5th.  TUG with rollator walker: 24.58 sec  GAIT: 05/13/2023:  Ambulation with SPC.  Reported FWW with store activity.  Reported walking independently at home mostly.   04/20/2023: Gait velocity with cane 2.11 ft/sec  03/24/2023 Distance walked: 150' Assistive device utilized: Environmental consultant - 4 wheeled / rollator  walker Level of assistance: SBA /no balance deficits with BUE support of walker but needs cues for deviaitons Comments: gait velocity 1.99 ft/sec  decreased stride length R>L, flexed trunk, hip & knees flexed in stance,                  TODAY'S TREATMENT                                                                          DATE: 05/13/2023 Therapeutic Exercise: Nustep lvl 5 UE/LE 10 mins for endurance, ROM Sit to stand to sit x 5 with hands on 18 inch chair, 18 inch chair with foam pad with focus on reduced hand pressure as able x 5  Seated marching x 10 bilateral   Neuro Re-ed Tandem stance 1 min x 2 bilateral with occasional HHA on // bars.  Lateral side stepping in // bars 10 ft x 3 each way   Gait Training Time spent in verbal and visual education, demonstration and education on rationale for [facility] use in Lt UE to help offload and support Rt leg.  Trial in clinic 100 ft with verbal cues, then performed between activity in clinic with occasional cues.    TODAY'S TREATMENT                                                                          DATE: 04/22/2023 Therapeutic Exercise: Recumbent bike seat 9 full revolutions level 3 8 minutes  Therapeutic Activities: Sit  to stand 24" bar stool without UE assist 5 reps; stand to sit with light touch bar stool to ensure under her  leg press BLEs 93# 10 reps 2 sets; single leg 37# 10 reps 2 sets ea LE Standing hip strengthening and balance/stance stability with green Thera-Band, single upper extremity support on chair back& cane: 10 reps each with both LEs SLR flexion, abduction, extension and adduction. PT added to HEP with HO.  Pt verbalized understanding.  Self-Care: PT educated pt that burns occur related to both temperature and time.  So even low heat (like a hot bath or heating pad) left on too long can cause a burn. Pt verbalized understanding.    TREATMENT                                                                          DATE:  04/20/2023 Therapeutic Exercise: Recumbent bike seat 9 full revolutions level 1 8 minutes  Therapeutic Activities: Gait velocity with cane 2.11 ft/sec leg press BLEs 87# 10 reps 2 sets; single leg 25# 10 reps 2 sets ea LE (tried 37# but too heavy today) Stepping over 6" hurdles (4) in //bars leading over with RLE one direction & LLE other direction 3 laps Side stepping over 6" hurdle with BUE on //bar 10 reps Standing hip strengthening and balance/stance stability with green Thera-Band, single upper extremity support on chair back: 10 reps each with both LEs SLR flexion, abduction, extension and adduction.  Self-Care: PT educated pt on shoes. They should provide cushion and support.  PT recommended having [employer] analyze her gait & standing with shoe recommendation.  Pt verbalized understanding.     TREATMENT                                                                          DATE: 04/15/2023 Therapeutic Exercise: Recumbent bike seat 9 full revolutions level 1 8 minutes  Therapeutic Activities: Standing with back to doorframe reaching single UEs & BUEs overhead 2 reps ea 2 deep breathes.  PT recommended performing when exiting bathroom throughout her day.  This exercise is trying to facilitate more upright posture.  Pt verbalized understanding.  leg press BLEs 87# 10 reps 2 sets; single leg 37# 10 reps 2 sets ea LE Standing hip strengthening and balance/stance stability with green Thera-Band, single upper extremity support on chair back: 10 reps each with both LEs SLR flexion, abduction, extension and adduction.  Self-care: PT demo and verbal cues on ice massage to right hip for edema and pain.  Patient verbalized understanding. PT demo and verbal cues on elevation getting BLEs over her heart and performing intermittent ankle activities for muscle use.  PT recommending 10 to 15 minutes 2 times per day.  Patient verbalized understanding    PATIENT EDUCATION:  04/05/2023:  Education  details: HEP update Person educated: Patient Education method: Programmer, multimedia, Demonstration, Verbal cues, and Handouts Education comprehension: verbalized understanding, returned demonstration, and verbal cues  required  HOME EXERCISE PROGRAM: Access Code: [email or web address] URL: [email or web address] Date: 04/22/2023 Prepared by: Lorie Rook  Exercises - sit to stand to sit with counter  - 1 x daily - 5 x weekly - 2 sets - 5 reps - 5 seconds hold - Supine March with Posterior Pelvic Tilt  - 1-2 x daily - 7 x weekly - 2 sets - 5-10 reps - Supine Bridge with Resistance Band  - 1-2 x daily - 7 x weekly - 1-2 sets - 5-10 reps - 2 hold - Hooklying Isometric Clamshell  - 1-2 x daily - 7 x weekly - 2-3 sets - 10-15 reps - Seated Quad Set  - 2-3 x daily - 7 x weekly - 1 sets - 10 reps - 5 hold - Standing Hip Flexion with Resistance (Mirrored)  - 1 x daily - 7 x weekly - 1 sets - 10 reps - Standing Hip Adduction with Resistance (Mirrored)  - 1 x daily - 7 x weekly - 1 sets - 10 reps - Standing Hip Extension with Resistance  - 1 x daily - 7 x weekly - 1 sets - 10 reps - Standing Hip Abduction with Theraband Resistance  - 1 x daily - 7 x weekly - 1 sets - 10 reps   ASSESSMENT: CLINICAL IMPRESSION: The patient has attended 9 visits over the course of treatment cycle.  Patient has reported overall improvement at 50%.  See objective data above for updated information regarding current presentation.  Pt has made transitoining to more use of SPC.  Detailed instruction and education given about recommendation of cane in Lt hand to help Rt leg vs. Current use of Rt hand. Pt continued to present with pain complaints with muscle deficits and balance deficits.    OBJECTIVE IMPAIRMENTS: Abnormal gait, decreased activity tolerance, decreased balance, decreased knowledge of condition, decreased knowledge of use of DME, decreased mobility, difficulty walking, decreased ROM, decreased strength, impaired  flexibility, postural dysfunction, obesity, and pain.   ACTIVITY LIMITATIONS: carrying, lifting, bending, standing, squatting, stairs, transfers, and locomotion level  PARTICIPATION LIMITATIONS: community activity  PERSONAL FACTORS: Age, Fitness, Time since onset of injury/illness/exacerbation, and 3+ comorbidities: see PMH  are also affecting patient's functional outcome.   REHAB POTENTIAL: Good  CLINICAL DECISION MAKING: Stable/uncomplicated  EVALUATION COMPLEXITY: Low   GOALS: Goals reviewed with patient? Yes  SHORT TERM GOALS: (target date for Short term goals 04/16/2023)   1.  Patient will demonstrate independent use of home exercise program to maintain progress from in clinic treatments. Baseline: See objective data Goal status: MET 04/13/2023  2. Patient reports bilateral low extremity pain improved 25% since evaluation Baseline: See objective data Goal status: Met 04/13/2023  LONG TERM GOALS: (target dates for all long term goals  .06/24/2023 )   1. Patient will demonstrate/report pain less than or equal to 2/10 to facilitate minimal limitation in daily activity secondary to pain symptoms. Baseline: See objective data Goal status: revised on 05/13/2023   2. Patient will demonstrate independent use of home exercise program to facilitate ability to maintain/progress functional gains from skilled physical therapy services. Baseline: See objective data Goal status: revised on 05/13/2023  3.  Patient reports Patient-Specific Activity Score improved the average by 3 to indicate improvement in functional activities.  Baseline: SEE OBJECTIVE DATA Goal status:  revised on 05/13/2023   4.  Patient will demonstrate bilateral LE MMT >4/5 throughout to faciltiate usual transfers, stairs, squatting at [facility] for daily life.  Baseline: See objective data Goal  status:  revised on 05/13/2023   5.  Timed Up & Go with FWW < 15 sec Baseline: See objective data Goal status:  revised on 05/13/2023    6.  5 times sit to/from stand <40 sec Baseline: See objective data Goal status:  revised on 05/13/2023   7.  Patient ambulates 300' with cane safely modified independent.  Baseline: See objective data Goal status:  revised on 05/13/2023  PLAN:  PT FREQUENCY:  2x/week  PT DURATION: 6 weeks  PLANNED INTERVENTIONS: 97164- PT Re-evaluation, 97110-Therapeutic exercises, 97530- Therapeutic activity, 97112- Neuromuscular re-education, 97535- Self Care, 30865- Manual therapy, [phone]- Gait training, [phone]- Ionotophoresis 4mg /ml Dexamethasone , Patient/Family education, Balance training, Stair training, Dry Needling, Joint mobilization, Vestibular training, DME instructions, and Physical Performance Testing  PLAN FOR NEXT SESSION: Continued functional strengthening, balance control.  Recheck comfort with cane in Lt UE.    Bonna Bustard, PT, DPT, OCS, ATC 05/13/23  10:46 AM

## 2023-05-17 ENCOUNTER — Ambulatory Visit: Admitting: Rehabilitative and Restorative Service Providers"

## 2023-05-17 ENCOUNTER — Encounter: Payer: Self-pay | Admitting: Rehabilitative and Restorative Service Providers"

## 2023-05-17 DIAGNOSIS — M6281 Muscle weakness (generalized): Secondary | ICD-10-CM

## 2023-05-17 DIAGNOSIS — M79605 Pain in left leg: Secondary | ICD-10-CM | POA: Diagnosis not present

## 2023-05-17 DIAGNOSIS — M79604 Pain in right leg: Secondary | ICD-10-CM | POA: Diagnosis not present

## 2023-05-17 DIAGNOSIS — M2560 Stiffness of unspecified joint, not elsewhere classified: Secondary | ICD-10-CM

## 2023-05-17 DIAGNOSIS — R2689 Other abnormalities of gait and mobility: Secondary | ICD-10-CM

## 2023-05-17 NOTE — Therapy (Signed)
 OUTPATIENT PHYSICAL THERAPY TREATMENT   Patient Name: Terri Mckee MRN: [id number] DOB:[birth year], 83 y.o., female Today's Date: 05/17/2023   END OF SESSION:  PT End of Session - 05/17/23 1516     Visit Number 10    Number of Visits 20    Date for PT Re-Evaluation 06/24/23    Authorization Type BCBS Medicare    Progress Note Due on Visit 19    PT Start Time 1510    PT Stop Time 1550    PT Time Calculation (min) 40 min    Activity Tolerance Patient limited by fatigue;Patient limited by pain    Behavior During Therapy [facility] for tasks assessed/performed                      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   Cataract    Cataracts, bilateral 07/25/2015   Cervical cancer screening 07/07/2012   Colon polyps    Elevated serum creatinine    with ACE inhibitor (Normal MRA of renal arteries)   GERD (gastroesophageal reflux disease)    History of cyst of breast    Hoarseness of voice 03/13/2012   Hypertension    Lactose intolerance    causes gas   Medicare annual wellness visit, subsequent 09/02/2014   Muscle spasm of left lower extremity 03/21/2007   Qualifier: Diagnosis of  By: Marthe Slain    Osteoarthritis    Osteopenia 08/17/2014   Preventative health care 04/05/2016   Shortness of breath    Skin lesion 07/10/2012   Urinary incontinence 07/10/2012   Can't always get to bathroom on time   Vitamin D  deficiency 04/03/2016   Past Surgical History:  Procedure Laterality Date   APPENDECTOMY     BREAST CYST EXCISION Bilateral    right breast, twice   OVARY SURGERY     right ovary & tube replacement   TOTAL HIP ARTHROPLASTY Right 10/20/2022   Procedure: RIGHT TOTAL HIP ARTHROPLASTY ANTERIOR APPROACH;  Surgeon:  Arnie Lao, MD;  Location: [facility] OR;  Service: Orthopedics;  Laterality: Right;  Attempted spinal converted to general   WRIST GANGLION EXCISION     rt   Patient Active Problem List   Diagnosis Date Noted   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   CRP elevated 09/01/2020   Grief at loss of child 09/01/2020   Pain in left knee 04/17/2020   Muscle weakness 03/31/2020   Myalgia 03/31/2020   Insulin  resistance 03/30/2018   Class 1 obesity with serious comorbidity and body mass index (BMI) of 31.0 to 31.9 in adult 03/16/2018   Muscle cramp, nocturnal 06/14/2017   Preventative health care 04/05/2016   Vitamin D  deficiency 04/03/2016   Cataracts, bilateral 07/25/2015   Left shoulder pain 01/29/2015   Breast cancer screening 09/02/2014   Decreased visual acuity 09/02/2014   Hearing loss 09/02/2014   Bilateral hip pain 09/02/2014   Medicare annual wellness visit, subsequent 09/02/2014   Osteopenia 08/17/2014   Right knee pain 09/24/2013   Low back pain 12/03/2012   Skin lesion 07/10/2012   Urinary incontinence 07/10/2012   Cervical cancer screening 07/07/2012   Cough 04/01/2012  Hoarseness of voice 03/13/2012   Cervical radiculopathy 12/03/2011   GERD 09/13/2009   Pain in both knees 12/21/2008   ECZEMA, 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   Hyperlipidemia, mixed 10/28/2007   INSOMNIA 10/28/2007   Allergic rhinitis 07/01/2007   Muscle cramps 03/21/2007   Essential hypertension 08/06/2006   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   PERTINENT HISTORY: Right THA 10/20/22, OA, muscle weakness, Myalgia, Vit D deficiency, osteopenia, LBP, HTN, cervical radiculopathy  PAIN:  NPRS scale:   up to 4/10 Pain location: both knees  Pain description: 'hurting pain" Aggravating factors: getting up after sitting.  Relieving factors: after moving some for knees, also resting for knees   PRECAUTIONS: Fall  WEIGHT BEARING RESTRICTIONS: No  FALLS:  Has patient fallen in last 6 months? No  LIVING ENVIRONMENT: Lives with: lives with their spouse / elderly using walker Lives in: House single level Stairs: Yes: External: 4 steps; can reach both Has following equipment at home: Single point cane, Walker - 2 wheeled, Environmental consultant - 4 wheeled, Tour manager, and Grab bars  OCCUPATION: retired  PLOF: Independent using cane for limited distances in community  PATIENT GOALS: walk good,   OBJECTIVE:    Patient-Specific Activity Scoring Scheme  "0" represents "unable to perform." "10" represents "able to perform at prior level. 0 1 2 3 4 5 6 7 8 9  10 (Date and Score)   Activity 03/24/2023 04/13/23  05/13/2023  1. Clean house  1 5  5   2. Put on shoes   1  4 7   3. walking 2 8 6   4.getting in / out of car 3 6 5   5.     Score 1.75 5.75 5.75 avg   Total score = sum of the activity scores/number of activities Minimum detectable change (90%CI) for average score = 2 points Minimum detectable change (90%CI) for single activity score = 3 points  COGNITION: 03/24/2023 Overall cognitive status: WFL    SENSATION: 03/24/2023 WFL except numbness right lateral hip  MUSCLE LENGTH: 03/24/2023 Hamstrings (hips at 90* extending knee): Right -47 deg; Left -44 deg Thomas test: Right -39 deg; Left -35 deg  POSTURE:  03/24/2023 rounded shoulders, forward head, and flexed trunk   PALPATION: 03/24/2023 Tenderness over Bil. Quad including quad & patella tendon,  lateral knee  R>L,   LOWER EXTREMITY ROM:   ROM Right 03/24/2023 Left 03/24/2023  Hip flexion    Hip extension Standing A: -12* Standing A: -14*  Hip abduction    Hip adduction    Hip internal rotation    Hip external rotation    Knee flexion Seated A: 109* Seated A: 113*  Knee extension LAQ A: -17* LAQ A: -15*  Ankle dorsiflexion    Ankle plantarflexion    Ankle inversion    Ankle eversion     (Blank rows = not tested)  LOWER EXTREMITY MMT:  MMT Right 03/24/2023 Left 03/24/2023 Right 05/13/2023 Left 05/13/2023  Hip flexion   4/5 5/5  Hip extension Standing 4/5 Standing 4/5  Hip abduction Standing 4/5 Standing 4/5    Hip adduction      Hip internal rotation      Hip external rotation      Knee flexion Standing 4/5 Standing 4/5 5/5 5/5  Knee extension 4/5 4/5 5/5 5/5  Ankle dorsiflexion 5/5 5/5    Ankle plantarflexion      Ankle inversion      Ankle eversion       (Blank rows = not tested)  FUNCTIONAL TESTS:  05/13/2023: TUG with SPC Rt hand: 27.5 seconds  03/24/2023 18 inch chair transfer: significant UE use on armrests with multiple attempts. 5 times sit to/from stand: 54.57 sec pt reports no change in BLE pain from 1st to 5th.  TUG with rollator walker: 24.58 sec  GAIT: 05/13/2023:  Ambulation with SPC.  Reported FWW with store activity.  Reported walking independently at home mostly.   04/20/2023: Gait velocity with cane 2.11 ft/sec  03/24/2023 Distance walked: 150' Assistive device utilized: Environmental consultant - 4 wheeled / rollator walker Level of assistance: SBA /no balance deficits with BUE support of walker but needs cues for deviaitons Comments: gait velocity 1.99 ft/sec  decreased stride length R>L, flexed trunk, hip & knees flexed in stance,                  TODAY'S TREATMENT                                                                          DATE: 05/17/2023 Therapeutic Exercise: Incline gastroc stretch 30 sec x 3 bilateral  Seated marching 2 x 10 bilateral   Seated blue band hip clam shell x 20 bilateral with focus on isometric hold of contralateral leg.  Nustep lvl 5 UE/LE 10 mins for endurance, ROM  Neuro Re-ed Tandem stance 1 min x 2 bilateral with occasional HHA on // bars.  Standing alternating heel/toe lifts x 15 each Education verbally on neuro muscular control /coordination and benefits for ambulation stability.   TherActivity(to improve transfers, stairs, ambulation ) Leg press double press 62 lbs 2 x 15 (reduced weight due to Rt knee pain reported)   TODAY'S TREATMENT                                                                          DATE: 05/13/2023 Therapeutic Exercise: Nustep lvl 5 UE/LE 10 mins for endurance, ROM Sit to stand to sit x 5 with hands on 18 inch chair, 18 inch chair with foam pad with focus on reduced hand pressure as able x 5  Seated marching x 10 bilateral   Neuro Re-ed Tandem stance 1 min x 2 bilateral with occasional HHA on // bars.  Lateral side stepping in // bars 10 ft x 3 each way   Gait Training Time spent in verbal and visual education, demonstration and education on rationale for [facility] use in Lt UE to help offload and support Rt leg.  Trial in  clinic 100 ft with verbal cues, then performed between activity in clinic with occasional cues.    TODAY'S TREATMENT                                                                          DATE: 04/22/2023 Therapeutic Exercise: Recumbent bike seat 9 full revolutions level 3 8 minutes  Therapeutic Activities: Sit to stand 24" bar stool without UE assist 5 reps; stand to sit with light touch bar stool to ensure under her  leg press BLEs 93# 10 reps 2 sets; single leg 37# 10 reps 2 sets ea LE Standing hip strengthening and balance/stance stability with green Thera-Band, single upper extremity support on chair back& cane: 10 reps each with both LEs SLR flexion, abduction, extension and adduction. PT added to HEP with HO.  Pt verbalized  understanding.  Self-Care: PT educated pt that burns occur related to both temperature and time.  So even low heat (like a hot bath or heating pad) left on too long can cause a burn. Pt verbalized understanding.    TREATMENT                                                                          DATE: 04/20/2023 Therapeutic Exercise: Recumbent bike seat 9 full revolutions level 1 8 minutes  Therapeutic Activities: Gait velocity with cane 2.11 ft/sec leg press BLEs 87# 10 reps 2 sets; single leg 25# 10 reps 2 sets ea LE (tried 37# but too heavy today) Stepping over 6" hurdles (4) in //bars leading over with RLE one direction & LLE other direction 3 laps Side stepping over 6" hurdle with BUE on //bar 10 reps Standing hip strengthening and balance/stance stability with green Thera-Band, single upper extremity support on chair back: 10 reps each with both LEs SLR flexion, abduction, extension and adduction.  Self-Care: PT educated pt on shoes. They should provide cushion and support.  PT recommended having [employer] analyze her gait & standing with shoe recommendation.  Pt verbalized understanding.     TREATMENT                                                                          DATE: 04/15/2023 Therapeutic Exercise: Recumbent bike seat 9 full revolutions level 1 8 minutes  Therapeutic Activities: Standing with back to doorframe reaching single UEs & BUEs overhead 2 reps ea 2 deep breathes.  PT recommended performing when exiting bathroom throughout her day.  This exercise is trying to facilitate more upright posture.  Pt verbalized understanding.  leg press BLEs 87# 10 reps 2 sets; single leg 37# 10 reps 2 sets ea LE Standing hip strengthening and balance/stance stability  with green Thera-Band, single upper extremity support on chair back: 10 reps each with both LEs SLR flexion, abduction, extension and adduction.  Self-care: PT demo and verbal cues on ice massage to right hip for  edema and pain.  Patient verbalized understanding. PT demo and verbal cues on elevation getting BLEs over her heart and performing intermittent ankle activities for muscle use.  PT recommending 10 to 15 minutes 2 times per day.  Patient verbalized understanding    PATIENT EDUCATION:  04/05/2023:  Education details: HEP update Person educated: Patient Education method: Programmer, multimedia, Demonstration, Verbal cues, and Handouts Education comprehension: verbalized understanding, returned demonstration, and verbal cues required  HOME EXERCISE PROGRAM: Access Code: [email or web address] URL: [email or web address] Date: 04/22/2023 Prepared by: Lorie Rook  Exercises - sit to stand to sit with counter  - 1 x daily - 5 x weekly - 2 sets - 5 reps - 5 seconds hold - Supine March with Posterior Pelvic Tilt  - 1-2 x daily - 7 x weekly - 2 sets - 5-10 reps - Supine Bridge with Resistance Band  - 1-2 x daily - 7 x weekly - 1-2 sets - 5-10 reps - 2 hold - Hooklying Isometric Clamshell  - 1-2 x daily - 7 x weekly - 2-3 sets - 10-15 reps - Seated Quad Set  - 2-3 x daily - 7 x weekly - 1 sets - 10 reps - 5 hold - Standing Hip Flexion with Resistance (Mirrored)  - 1 x daily - 7 x weekly - 1 sets - 10 reps - Standing Hip Adduction with Resistance (Mirrored)  - 1 x daily - 7 x weekly - 1 sets - 10 reps - Standing Hip Extension with Resistance  - 1 x daily - 7 x weekly - 1 sets - 10 reps - Standing Hip Abduction with Theraband Resistance  - 1 x daily - 7 x weekly - 1 sets - 10 reps   ASSESSMENT: CLINICAL IMPRESSION: WB strengthening limited due to Rt knee pain reported, specifically on leg press even with reduced weight compared to previous performed weight. Continued strengthening and balance improvements to help improve ambulation control and daily progressive mobility.    OBJECTIVE IMPAIRMENTS: Abnormal gait, decreased activity tolerance, decreased balance, decreased knowledge of condition, decreased  knowledge of use of DME, decreased mobility, difficulty walking, decreased ROM, decreased strength, impaired flexibility, postural dysfunction, obesity, and pain.   ACTIVITY LIMITATIONS: carrying, lifting, bending, standing, squatting, stairs, transfers, and locomotion level  PARTICIPATION LIMITATIONS: community activity  PERSONAL FACTORS: Age, Fitness, Time since onset of injury/illness/exacerbation, and 3+ comorbidities: see PMH  are also affecting patient's functional outcome.   REHAB POTENTIAL: Good  CLINICAL DECISION MAKING: Stable/uncomplicated  EVALUATION COMPLEXITY: Low   GOALS: Goals reviewed with patient? Yes  SHORT TERM GOALS: (target date for Short term goals 04/16/2023)   1.  Patient will demonstrate independent use of home exercise program to maintain progress from in clinic treatments. Baseline: See objective data Goal status: MET 04/13/2023  2. Patient reports bilateral low extremity pain improved 25% since evaluation Baseline: See objective data Goal status: Met 04/13/2023  LONG TERM GOALS: (target dates for all long term goals  .06/24/2023 )   1. Patient will demonstrate/report pain less than or equal to 2/10 to facilitate minimal limitation in daily activity secondary to pain symptoms. Baseline: See objective data Goal status: revised on 05/13/2023   2. Patient will demonstrate independent use of home exercise program to facilitate ability to maintain/progress functional gains from skilled physical  therapy services. Baseline: See objective data Goal status: revised on 05/13/2023  3.  Patient reports Patient-Specific Activity Score improved the average by 3 to indicate improvement in functional activities.  Baseline: SEE OBJECTIVE DATA Goal status:  revised on 05/13/2023   4.  Patient will demonstrate bilateral LE MMT >4/5 throughout to faciltiate usual transfers, stairs, squatting at [facility] for daily life.  Baseline: See objective data Goal status:  revised on  05/13/2023   5.  Timed Up & Go with FWW < 15 sec Baseline: See objective data Goal status:  revised on 05/13/2023   6.  5 times sit to/from stand <40 sec Baseline: See objective data Goal status:  revised on 05/13/2023   7.  Patient ambulates 300' with cane safely modified independent.  Baseline: See objective data Goal status:  revised on 05/13/2023  PLAN:  PT FREQUENCY:  2x/week  PT DURATION: 6 weeks  PLANNED INTERVENTIONS: 97164- PT Re-evaluation, 97110-Therapeutic exercises, 97530- Therapeutic activity, 97112- Neuromuscular re-education, 97535- Self Care, 16109- Manual therapy, [phone]- Gait training, [phone]- Ionotophoresis 4mg /ml Dexamethasone , Patient/Family education, Balance training, Stair training, Dry Needling, Joint mobilization, Vestibular training, DME instructions, and Physical Performance Testing  PLAN FOR NEXT SESSION: WB strengthening as able, watch knee pain.    Bonna Bustard, PT, DPT, OCS, ATC 05/17/23  3:51 PM

## 2023-05-19 ENCOUNTER — Encounter: Payer: Self-pay | Admitting: Rehabilitative and Restorative Service Providers"

## 2023-05-19 ENCOUNTER — Ambulatory Visit: Admitting: Rehabilitative and Restorative Service Providers"

## 2023-05-19 DIAGNOSIS — M79605 Pain in left leg: Secondary | ICD-10-CM

## 2023-05-19 DIAGNOSIS — M6281 Muscle weakness (generalized): Secondary | ICD-10-CM

## 2023-05-19 DIAGNOSIS — M79604 Pain in right leg: Secondary | ICD-10-CM | POA: Diagnosis not present

## 2023-05-19 DIAGNOSIS — M2560 Stiffness of unspecified joint, not elsewhere classified: Secondary | ICD-10-CM

## 2023-05-19 DIAGNOSIS — R2689 Other abnormalities of gait and mobility: Secondary | ICD-10-CM | POA: Diagnosis not present

## 2023-05-19 NOTE — Therapy (Signed)
 OUTPATIENT PHYSICAL THERAPY TREATMENT   Patient Name: Terri Mckee MRN: [id number] DOB:1940-11-07, 83 y.o., female Today's Date: 05/19/2023   END OF SESSION:  PT End of Session - 05/19/23 1435     Visit Number 11    Number of Visits 20    Date for PT Re-Evaluation 06/24/23    Authorization Type BCBS Medicare    Progress Note Due on Visit 19    PT Start Time 1430    PT Stop Time 1509    PT Time Calculation (min) 39 min    Activity Tolerance Patient limited by fatigue;Patient limited by pain    Behavior During Therapy [facility] for tasks assessed/performed                       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   Cataract    Cataracts, bilateral 07/25/2015   Cervical cancer screening 07/07/2012   Colon polyps    Elevated serum creatinine    with ACE inhibitor (Normal MRA of renal arteries)   GERD (gastroesophageal reflux disease)    History of cyst of breast    Hoarseness of voice 03/13/2012   Hypertension    Lactose intolerance    causes gas   Medicare annual wellness visit, subsequent 09/02/2014   Muscle spasm of left lower extremity 03/21/2007   Qualifier: Diagnosis of  By: Marthe Slain    Osteoarthritis    Osteopenia 08/17/2014   Preventative health care 04/05/2016   Shortness of breath    Skin lesion 07/10/2012   Urinary incontinence 07/10/2012   Can't always get to bathroom on time   Vitamin D  deficiency 04/03/2016   Past Surgical History:  Procedure Laterality Date   APPENDECTOMY     BREAST CYST EXCISION Bilateral    right breast, twice   OVARY SURGERY     right ovary & tube replacement   TOTAL HIP ARTHROPLASTY Right 10/20/2022   Procedure: RIGHT TOTAL HIP ARTHROPLASTY ANTERIOR APPROACH;  Surgeon:  Arnie Lao, MD;  Location: [facility] OR;  Service: Orthopedics;  Laterality: Right;  Attempted spinal converted to general   WRIST GANGLION EXCISION     rt   Patient Active Problem List   Diagnosis Date Noted   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   CRP elevated 09/01/2020   Grief at loss of child 09/01/2020   Pain in left knee 04/17/2020   Muscle weakness 03/31/2020   Myalgia 03/31/2020   Insulin  resistance 03/30/2018   Class 1 obesity with serious comorbidity and body mass index (BMI) of 31.0 to 31.9 in adult 03/16/2018   Muscle cramp, nocturnal 06/14/2017   Preventative health care 04/05/2016   Vitamin D  deficiency 04/03/2016   Cataracts, bilateral 07/25/2015   Left shoulder pain 01/29/2015   Breast cancer screening 09/02/2014   Decreased visual acuity 09/02/2014   Hearing loss 09/02/2014   Bilateral hip pain 09/02/2014   Medicare annual wellness visit, subsequent 09/02/2014   Osteopenia 08/17/2014   Right knee pain 09/24/2013   Low back pain 12/03/2012   Skin lesion 07/10/2012   Urinary incontinence 07/10/2012   Cervical cancer screening 07/07/2012   Cough 04/01/2012  Hoarseness of voice 03/13/2012   Cervical radiculopathy 12/03/2011   GERD 09/13/2009   Pain in both knees 12/21/2008   ECZEMA, 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   Hyperlipidemia, mixed 10/28/2007   INSOMNIA 10/28/2007   Allergic rhinitis 07/01/2007   Muscle cramps 03/21/2007   Essential hypertension 08/06/2006   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  Pt indicated no worse from symptoms after last visit with leg press.   PERTINENT HISTORY: Right THA 10/20/22, OA, muscle weakness, Myalgia, Vit D deficiency, osteopenia, LBP, HTN, cervical radiculopathy  PAIN:  NPRS scale:   up to 5/10 Pain location: both knees  Pain description: 'hurting pain", sore Aggravating factors: getting up after sitting., WB Relieving factors: after moving some for knees, also resting for knees   PRECAUTIONS: Fall  WEIGHT BEARING RESTRICTIONS: No  FALLS:  Has patient fallen in last 6 months? No  LIVING ENVIRONMENT: Lives with: lives with their spouse / elderly using walker Lives in: House single level Stairs: Yes: External: 4 steps; can reach both Has following equipment at home: Single point cane, Walker - 2 wheeled, Environmental consultant - 4 wheeled, Tour manager, and Grab bars  OCCUPATION: retired  PLOF: Independent using cane for limited distances in community  PATIENT GOALS: walk good,   OBJECTIVE:    Patient-Specific Activity Scoring Scheme  "0" represents "unable to perform." "10" represents "able to perform at prior level. 0 1 2 3 4 5 6 7 8 9  10 (Date and Score)   Activity 03/24/2023 04/13/23  05/13/2023  1. Clean house  1 5  5   2. Put on shoes   1  4 7   3. walking 2 8 6   4.getting in / out of car 3 6 5   5.     Score 1.75 5.75 5.75 avg   Total score = sum of the activity scores/number of activities Minimum detectable change (90%CI) for average score = 2 points Minimum detectable change (90%CI) for single activity score = 3 points  COGNITION: 03/24/2023 Overall cognitive status: WFL    SENSATION: 03/24/2023 WFL except numbness right lateral hip  MUSCLE LENGTH: 03/24/2023 Hamstrings (hips at 90* extending knee): Right -47 deg; Left -44 deg Thomas test: Right -39 deg; Left -35 deg  POSTURE:  03/24/2023 rounded shoulders, forward head, and flexed trunk    PALPATION: 03/24/2023 Tenderness over Bil. Quad including quad & patella tendon,  lateral knee R>L,   LOWER EXTREMITY ROM:   ROM Right 03/24/2023 Left 03/24/2023  Hip flexion    Hip extension Standing A: -12* Standing A: -14*  Hip abduction    Hip adduction    Hip internal rotation    Hip external rotation    Knee flexion Seated A: 109* Seated A: 113*  Knee extension LAQ A: -17* LAQ A: -15*  Ankle dorsiflexion    Ankle plantarflexion    Ankle inversion    Ankle eversion     (Blank rows = not tested)  LOWER EXTREMITY MMT:  MMT Right 03/24/2023 Left 03/24/2023 Right 05/13/2023  Left 05/13/2023  Hip flexion   4/5 5/5  Hip extension Standing 4/5 Standing 4/5    Hip abduction Standing 4/5 Standing 4/5    Hip adduction      Hip internal rotation      Hip external rotation      Knee flexion Standing 4/5 Standing 4/5 5/5 5/5  Knee extension 4/5 4/5 5/5 5/5  Ankle dorsiflexion 5/5 5/5    Ankle plantarflexion      Ankle inversion      Ankle eversion       (Blank rows = not tested)  FUNCTIONAL TESTS:  05/13/2023: TUG with SPC Rt hand: 27.5 seconds  03/24/2023 18 inch chair transfer: significant UE use on armrests with multiple attempts. 5 times sit to/from stand: 54.57 sec pt reports no change in BLE pain from 1st to 5th.  TUG with rollator walker: 24.58 sec  GAIT: 05/13/2023:  Ambulation with SPC.  Reported FWW with store activity.  Reported walking independently at home mostly.   04/20/2023: Gait velocity with cane 2.11 ft/sec  03/24/2023 Distance walked: 150' Assistive device utilized: Environmental consultant - 4 wheeled / rollator walker Level of assistance: SBA /no balance deficits with BUE support of walker but needs cues for deviaitons Comments: gait velocity 1.99 ft/sec  decreased stride length R>L, flexed trunk, hip & knees flexed in stance,                  TODAY'S TREATMENT                                                                          DATE:  05/19/2023 Therapeutic Exercise: Nustep lvl 5 UE/LE 10 mins for endurance, ROM Seated LAQ for ROM 2 x 10 bilateral  Seated unsupported marching alternating x 15 bilateral   Neuro Re-ed (motor control improvements, balance, neuro recruitment) Tandem stance 1 min x 2 bilateral with occasional HHA on // bars.  Seated quad set 5 sec hold x 10 bilateral - Additional time for education of techniques to improve quad activation.  Standing alternating heel/toe lifts x 15 each Feet together stance on foam with SBA 1 min, with head turns x 10    TODAY'S TREATMENT                                                                          DATE: 05/17/2023 Therapeutic Exercise: Incline gastroc stretch 30 sec x 3 bilateral  Seated marching 2 x 10 bilateral  Seated blue band hip clam shell x 20 bilateral with focus on isometric hold of contralateral leg.  Nustep lvl 5 UE/LE 10 mins for endurance, ROM  Neuro Re-ed Tandem stance 1 min x 2 bilateral with occasional HHA on // bars.  Standing alternating heel/toe lifts x 15 each Education verbally on neuro muscular control /coordination and benefits for ambulation stability.   TherActivity(to improve transfers, stairs, ambulation ) Leg press double press 62 lbs 2 x 15 (reduced weight due  to Rt knee pain reported)   TODAY'S TREATMENT                                                                          DATE: 05/13/2023 Therapeutic Exercise: Nustep lvl 5 UE/LE 10 mins for endurance, ROM Sit to stand to sit x 5 with hands on 18 inch chair, 18 inch chair with foam pad with focus on reduced hand pressure as able x 5  Seated marching x 10 bilateral   Neuro Re-ed Tandem stance 1 min x 2 bilateral with occasional HHA on // bars.  Lateral side stepping in // bars 10 ft x 3 each way   Gait Training Time spent in verbal and visual education, demonstration and education on rationale for [facility] use in Lt UE to help offload and support Rt leg.  Trial in clinic 100 ft  with verbal cues, then performed between activity in clinic with occasional cues.    TODAY'S TREATMENT                                                                          DATE: 04/22/2023 Therapeutic Exercise: Recumbent bike seat 9 full revolutions level 3 8 minutes  Therapeutic Activities: Sit to stand 24" bar stool without UE assist 5 reps; stand to sit with light touch bar stool to ensure under her  leg press BLEs 93# 10 reps 2 sets; single leg 37# 10 reps 2 sets ea LE Standing hip strengthening and balance/stance stability with green Thera-Band, single upper extremity support on chair back& cane: 10 reps each with both LEs SLR flexion, abduction, extension and adduction. PT added to HEP with HO.  Pt verbalized understanding.  Self-Care: PT educated pt that burns occur related to both temperature and time.  So even low heat (like a hot bath or heating pad) left on too long can cause a burn. Pt verbalized understanding.    PATIENT EDUCATION:  04/05/2023:  Education details: HEP update Person educated: Patient Education method: Solicitor, Verbal cues, and Handouts Education comprehension: verbalized understanding, returned demonstration, and verbal cues required  HOME EXERCISE PROGRAM: Access Code: [email or web address] URL: [email or web address] Date: 04/22/2023 Prepared by: Lorie Rook  Exercises - sit to stand to sit with counter  - 1 x daily - 5 x weekly - 2 sets - 5 reps - 5 seconds hold - Supine March with Posterior Pelvic Tilt  - 1-2 x daily - 7 x weekly - 2 sets - 5-10 reps - Supine Bridge with Resistance Band  - 1-2 x daily - 7 x weekly - 1-2 sets - 5-10 reps - 2 hold - Hooklying Isometric Clamshell  - 1-2 x daily - 7 x weekly - 2-3 sets - 10-15 reps - Seated Quad Set  - 2-3 x daily - 7 x weekly - 1 sets - 10 reps - 5 hold - Standing Hip Flexion with Resistance (Mirrored)  - 1 x daily - 7 x weekly -  1 sets - 10 reps - Standing Hip Adduction with  Resistance (Mirrored)  - 1 x daily - 7 x weekly - 1 sets - 10 reps - Standing Hip Extension with Resistance  - 1 x daily - 7 x weekly - 1 sets - 10 reps - Standing Hip Abduction with Theraband Resistance  - 1 x daily - 7 x weekly - 1 sets - 10 reps   ASSESSMENT: CLINICAL IMPRESSION: Adjusted today intervention to avoid exacerbation of symptoms with prolonged WB activity/loading.  Will plan to reintroduce as able. Quad activation fair in quad set today.  Continued functional LE strengthening bilateral to help improvement in WB activty and knee pain.  Continued skilled PT services indicated at this time.    OBJECTIVE IMPAIRMENTS: Abnormal gait, decreased activity tolerance, decreased balance, decreased knowledge of condition, decreased knowledge of use of DME, decreased mobility, difficulty walking, decreased ROM, decreased strength, impaired flexibility, postural dysfunction, obesity, and pain.   ACTIVITY LIMITATIONS: carrying, lifting, bending, standing, squatting, stairs, transfers, and locomotion level  PARTICIPATION LIMITATIONS: community activity  PERSONAL FACTORS: Age, Fitness, Time since onset of injury/illness/exacerbation, and 3+ comorbidities: see PMH  are also affecting patient's functional outcome.   REHAB POTENTIAL: Good  CLINICAL DECISION MAKING: Stable/uncomplicated  EVALUATION COMPLEXITY: Low   GOALS: Goals reviewed with patient? Yes  SHORT TERM GOALS: (target date for Short term goals 04/16/2023)   1.  Patient will demonstrate independent use of home exercise program to maintain progress from in clinic treatments. Baseline: See objective data Goal status: MET 04/13/2023  2. Patient reports bilateral low extremity pain improved 25% since evaluation Baseline: See objective data Goal status: Met 04/13/2023  LONG TERM GOALS: (target dates for all long term goals  .06/24/2023 )   1. Patient will demonstrate/report pain less than or equal to 2/10 to facilitate minimal  limitation in daily activity secondary to pain symptoms. Baseline: See objective data Goal status: revised on 05/13/2023   2. Patient will demonstrate independent use of home exercise program to facilitate ability to maintain/progress functional gains from skilled physical therapy services. Baseline: See objective data Goal status: revised on 05/13/2023  3.  Patient reports Patient-Specific Activity Score improved the average by 3 to indicate improvement in functional activities.  Baseline: SEE OBJECTIVE DATA Goal status:  revised on 05/13/2023   4.  Patient will demonstrate bilateral LE MMT >4/5 throughout to faciltiate usual transfers, stairs, squatting at [facility] for daily life.  Baseline: See objective data Goal status:  revised on 05/13/2023   5.  Timed Up & Go with FWW < 15 sec Baseline: See objective data Goal status:  revised on 05/13/2023   6.  5 times sit to/from stand <40 sec Baseline: See objective data Goal status:  revised on 05/13/2023   7.  Patient ambulates 300' with cane safely modified independent.  Baseline: See objective data Goal status:  revised on 05/13/2023  PLAN:  PT FREQUENCY:  2x/week  PT DURATION: 6 weeks  PLANNED INTERVENTIONS: 97164- PT Re-evaluation, 97110-Therapeutic exercises, 97530- Therapeutic activity, 97112- Neuromuscular re-education, 97535- Self Care, 40981- Manual therapy, [phone]- Gait training, [phone]- Ionotophoresis 4mg /ml Dexamethasone , Patient/Family education, Balance training, Stair training, Dry Needling, Joint mobilization, Vestibular training, DME instructions, and Physical Performance Testing  PLAN FOR NEXT SESSION: WB strengthening per symptom presentation.  Improve hip strength and quad activiation.    Bonna Bustard, PT, DPT, OCS, ATC 05/19/23  3:06 PM

## 2023-05-24 ENCOUNTER — Ambulatory Visit: Admitting: Physical Therapy

## 2023-05-24 ENCOUNTER — Encounter: Payer: Self-pay | Admitting: Physical Therapy

## 2023-05-24 DIAGNOSIS — M6281 Muscle weakness (generalized): Secondary | ICD-10-CM | POA: Diagnosis not present

## 2023-05-24 DIAGNOSIS — M79605 Pain in left leg: Secondary | ICD-10-CM

## 2023-05-24 DIAGNOSIS — M79604 Pain in right leg: Secondary | ICD-10-CM

## 2023-05-24 DIAGNOSIS — R2689 Other abnormalities of gait and mobility: Secondary | ICD-10-CM

## 2023-05-24 DIAGNOSIS — M2560 Stiffness of unspecified joint, not elsewhere classified: Secondary | ICD-10-CM

## 2023-05-24 NOTE — Therapy (Signed)
 OUTPATIENT PHYSICAL THERAPY TREATMENT   Patient Name: Terri Mckee MRN: [id number] DOB:08-Dec-1940, 83 y.o., female Today's Date: 05/24/2023   END OF SESSION:  PT End of Session - 05/24/23 1456     Visit Number 12    Number of Visits 20    Date for PT Re-Evaluation 06/24/23    Authorization Type BCBS Medicare    Progress Note Due on Visit 19    PT Start Time 1456    PT Stop Time 1534    PT Time Calculation (min) 38 min    Activity Tolerance Patient limited by fatigue;Patient limited by pain    Behavior During Therapy [facility] for tasks assessed/performed                        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   Cataract    Cataracts, bilateral 07/25/2015   Cervical cancer screening 07/07/2012   Colon polyps    Elevated serum creatinine    with ACE inhibitor (Normal MRA of renal arteries)   GERD (gastroesophageal reflux disease)    History of cyst of breast    Hoarseness of voice 03/13/2012   Hypertension    Lactose intolerance    causes gas   Medicare annual wellness visit, subsequent 09/02/2014   Muscle spasm of left lower extremity 03/21/2007   Qualifier: Diagnosis of  By: Marthe Slain    Osteoarthritis    Osteopenia 08/17/2014   Preventative health care 04/05/2016   Shortness of breath    Skin lesion 07/10/2012   Urinary incontinence 07/10/2012   Can't always get to bathroom on time   Vitamin D  deficiency 04/03/2016   Past Surgical History:  Procedure Laterality Date   APPENDECTOMY     BREAST CYST EXCISION Bilateral    right breast, twice   OVARY SURGERY     right ovary & tube replacement   TOTAL HIP ARTHROPLASTY Right 10/20/2022   Procedure: RIGHT TOTAL HIP ARTHROPLASTY ANTERIOR APPROACH;   Surgeon: Arnie Lao, MD;  Location: [facility] OR;  Service: Orthopedics;  Laterality: Right;  Attempted spinal converted to general   WRIST GANGLION EXCISION     rt   Patient Active Problem List   Diagnosis Date Noted   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   CRP elevated 09/01/2020   Grief at loss of child 09/01/2020   Pain in left knee 04/17/2020   Muscle weakness 03/31/2020   Myalgia 03/31/2020   Insulin  resistance 03/30/2018   Class 1 obesity with serious comorbidity and body mass index (BMI) of 31.0 to 31.9 in adult 03/16/2018   Muscle cramp, nocturnal 06/14/2017   Preventative health care 04/05/2016   Vitamin D  deficiency 04/03/2016   Cataracts, bilateral 07/25/2015   Left shoulder pain 01/29/2015   Breast cancer screening 09/02/2014   Decreased visual acuity 09/02/2014   Hearing loss 09/02/2014   Bilateral hip pain 09/02/2014   Medicare annual wellness visit, subsequent 09/02/2014   Osteopenia 08/17/2014   Right knee pain 09/24/2013   Low back pain 12/03/2012   Skin lesion 07/10/2012   Urinary incontinence 07/10/2012   Cervical cancer screening 07/07/2012   Cough  04/01/2012   Hoarseness of voice 03/13/2012   Cervical radiculopathy 12/03/2011   GERD 09/13/2009   Pain in both knees 12/21/2008   ECZEMA, 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   Hyperlipidemia, mixed 10/28/2007   INSOMNIA 10/28/2007   Allergic rhinitis 07/01/2007   Muscle cramps 03/21/2007   Essential hypertension 08/06/2006   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  "Not sure if it's the weather or not."  PERTINENT HISTORY: Right THA 10/20/22, OA, muscle weakness, Myalgia, Vit D deficiency, osteopenia, LBP, HTN, cervical radiculopathy  PAIN:  NPRS scale:   up to 5/10 Pain location: both knees  Pain description: 'hurting pain", sore Aggravating factors: getting up after sitting., WB Relieving factors: after moving some for knees, also resting for knees   Back 4/10 low back central  PRECAUTIONS: Fall  WEIGHT BEARING RESTRICTIONS: No  FALLS:  Has patient fallen in last 6 months? No  LIVING ENVIRONMENT: Lives with: lives with their spouse / elderly using walker Lives in: House single level Stairs: Yes: External: 4 steps; can reach both Has following equipment at home: Single point cane, Walker - 2 wheeled, Environmental consultant - 4 wheeled, Tour manager, and Grab bars  OCCUPATION: retired  PLOF: Independent using cane for limited distances in community  PATIENT GOALS: walk good,   OBJECTIVE:    Patient-Specific Activity Scoring Scheme  "0" represents "unable to perform." "10" represents "able to perform at prior level. 0 1 2 3 4 5 6 7 8 9  10 (Date and Score)   Activity 03/24/2023 04/13/23  05/13/2023  1. Clean house  1 5  5   2. Put on shoes   1  4 7   3. walking 2 8 6   4.getting in / out of car 3 6 5   5.     Score 1.75 5.75 5.75 avg   Total score = sum of the activity scores/number of activities Minimum detectable change (90%CI) for average score = 2 points Minimum detectable change (90%CI) for single activity score = 3 points  COGNITION: 03/24/2023 Overall cognitive status: WFL    SENSATION: 03/24/2023 WFL except numbness right lateral hip  MUSCLE LENGTH: 03/24/2023 Hamstrings (hips at 90* extending knee): Right -47 deg; Left -44 deg Thomas test: Right -39 deg; Left -35 deg  POSTURE:  03/24/2023 rounded shoulders, forward head, and flexed trunk    PALPATION: 03/24/2023 Tenderness over Bil. Quad including quad & patella tendon,  lateral knee R>L,   LOWER EXTREMITY ROM:   ROM Right 03/24/2023 Left 03/24/2023  Hip flexion    Hip extension Standing A: -12* Standing A: -14*  Hip abduction    Hip adduction    Hip internal rotation    Hip external rotation    Knee flexion Seated A: 109* Seated A: 113*  Knee extension LAQ A: -17* LAQ A: -15*  Ankle dorsiflexion    Ankle plantarflexion    Ankle inversion    Ankle eversion     (Blank rows = not tested)  LOWER EXTREMITY MMT:  MMT Right 03/24/2023  Left 03/24/2023 Right 05/13/2023 Left 05/13/2023  Hip flexion   4/5 5/5  Hip extension Standing 4/5 Standing 4/5    Hip abduction Standing 4/5 Standing 4/5    Hip adduction      Hip internal rotation      Hip external rotation      Knee flexion Standing 4/5 Standing 4/5 5/5 5/5  Knee extension 4/5 4/5 5/5 5/5  Ankle dorsiflexion 5/5 5/5    Ankle plantarflexion      Ankle inversion      Ankle eversion       (Blank rows = not tested)  FUNCTIONAL TESTS:  05/13/2023: TUG with SPC Rt hand: 27.5 seconds  03/24/2023 18 inch chair transfer: significant UE use on armrests with multiple attempts. 5 times sit to/from stand: 54.57 sec pt reports no change in BLE pain from 1st to 5th.  TUG with rollator walker: 24.58 sec  GAIT: 05/13/2023:  Ambulation with SPC.  Reported FWW with store activity.  Reported walking independently at home mostly.   04/20/2023: Gait velocity with cane 2.11 ft/sec  03/24/2023 Distance walked: 150' Assistive device utilized: Environmental consultant - 4 wheeled / rollator walker Level of assistance: SBA /no balance deficits with BUE support of walker but needs cues for deviaitons Comments: gait velocity 1.99 ft/sec  decreased stride length R>L, flexed trunk, hip & knees flexed in stance,                  TODAY'S TREATMENT                                                                          DATE:  05/24/2023 Therapeutic Exercise: Nustep seat 13 lvl 5 UE/LE 10 mins for endurance, ROM Seated LAQ for ROM 2 x 10 bilateral  Seated unsupported marching alternating x 15 bilateral  Seated alternating DF/PF 10 reps 2 sets Standing alternating heel lifts x 15 reps with BUE support  Neuro Re-ed (motor control improvements, balance, neuro recruitment) Tandem stance 1 min bilateral with occasional HHA on // bars.  Seated quad set 5 sec hold x 10 bilateral - Additional time for education of techniques to improve quad activation. Seated hamstring set 5 sec hold X 10 reps bilateral. - additional time for education on technique to improve hamstring activation.     TREATMENT                                                                          DATE: 05/19/2023 Therapeutic Exercise: Nustep lvl 5 UE/LE 10 mins for endurance, ROM Seated LAQ for ROM 2 x 10 bilateral  Seated unsupported marching alternating x 15 bilateral    Neuro Re-ed (motor control improvements, balance, neuro recruitment) Tandem stance 1 min x 2 bilateral with occasional HHA on // bars.  Seated quad set 5 sec hold x 10 bilateral - Additional time for education of techniques to improve quad activation.  Standing alternating heel/toe lifts x 15 each Feet  together stance on foam with SBA 1 min, with head turns x 10     TREATMENT                                                                          DATE: 05/17/2023 Therapeutic Exercise: Incline gastroc stretch 30 sec x 3 bilateral  Seated marching 2 x 10 bilateral  Seated blue band hip clam shell x 20 bilateral with focus on isometric hold of contralateral leg.  Nustep lvl 5 UE/LE 10 mins for endurance, ROM  Neuro Re-ed Tandem stance 1 min x 2 bilateral with occasional HHA on // bars.  Standing alternating heel/toe lifts x 15 each Education verbally on neuro muscular control /coordination and benefits for ambulation stability.   TherActivity(to improve transfers, stairs,  ambulation ) Leg press double press 62 lbs 2 x 15 (reduced weight due to Rt knee pain reported)     PATIENT EDUCATION:  04/05/2023:  Education details: HEP update Person educated: Patient Education method: Programmer, multimedia, Demonstration, Verbal cues, and Handouts Education comprehension: verbalized understanding, returned demonstration, and verbal cues required  HOME EXERCISE PROGRAM: Access Code: [email or web address] URL: [email or web address] Date: 04/22/2023 Prepared by: Lorie Rook  Exercises - sit to stand to sit with counter  - 1 x daily - 5 x weekly - 2 sets - 5 reps - 5 seconds hold - Supine March with Posterior Pelvic Tilt  - 1-2 x daily - 7 x weekly - 2 sets - 5-10 reps - Supine Bridge with Resistance Band  - 1-2 x daily - 7 x weekly - 1-2 sets - 5-10 reps - 2 hold - Hooklying Isometric Clamshell  - 1-2 x daily - 7 x weekly - 2-3 sets - 10-15 reps - Seated Quad Set  - 2-3 x daily - 7 x weekly - 1 sets - 10 reps - 5 hold - Standing Hip Flexion with Resistance (Mirrored)  - 1 x daily - 7 x weekly - 1 sets - 10 reps - Standing Hip Adduction with Resistance (Mirrored)  - 1 x daily - 7 x weekly - 1 sets - 10 reps - Standing Hip Extension with Resistance  - 1 x daily - 7 x weekly - 1 sets - 10 reps - Standing Hip Abduction with Theraband Resistance  - 1 x daily - 7 x weekly - 1 sets - 10 reps   ASSESSMENT: CLINICAL IMPRESSION: Patient's pain in knees & low back significantly limited activity tolerance today.  Patient appears to have improved her mobility overall but it was slower and guarded today due to the pain.  Patient continues to benefit from skilled PT services indicated at this time.   OBJECTIVE IMPAIRMENTS: Abnormal gait, decreased activity tolerance, decreased balance, decreased knowledge of condition, decreased knowledge of use of DME, decreased mobility, difficulty walking, decreased ROM, decreased strength, impaired flexibility, postural dysfunction, obesity, and  pain.   ACTIVITY LIMITATIONS: carrying, lifting, bending, standing, squatting, stairs, transfers, and locomotion level  PARTICIPATION LIMITATIONS: community activity  PERSONAL FACTORS: Age, Fitness, Time since onset of injury/illness/exacerbation, and 3+ comorbidities: see PMH are also affecting patient's functional outcome.   REHAB POTENTIAL: Good  CLINICAL DECISION MAKING: Stable/uncomplicated  EVALUATION COMPLEXITY: Low   GOALS: Goals reviewed with patient?  Yes  SHORT TERM GOALS: (target date for Short term goals 04/16/2023)   1.  Patient will demonstrate independent use of home exercise program to maintain progress from in clinic treatments. Baseline: See objective data Goal status: MET 04/13/2023  2. Patient reports bilateral low extremity pain improved 25% since evaluation Baseline: See objective data Goal status: Met 04/13/2023  LONG TERM GOALS: (target dates for all long term goals  .06/24/2023 )   1. Patient will demonstrate/report pain less than or equal to 2/10 to facilitate minimal limitation in daily activity secondary to pain symptoms. Baseline: See objective data Goal status: revised on 05/13/2023   2. Patient will demonstrate independent use of home exercise program to facilitate ability to maintain/progress functional gains from skilled physical therapy services. Baseline: See objective data Goal status: Ongoing  05/24/2023  3.  Patient reports Patient-Specific Activity Score improved the average by 3 to indicate improvement in functional activities.  Baseline: SEE OBJECTIVE DATA Goal status:  Ongoing  05/24/2023   4.  Patient will demonstrate bilateral LE MMT >4/5 throughout to faciltiate usual transfers, stairs, squatting at [facility] for daily life.  Baseline: See objective data Goal status:  Ongoing  05/24/2023   5.  Timed Up & Go with FWW < 15 sec Baseline: See objective data Goal status:  Ongoing  05/24/2023   6.  5 times sit to/from stand <40 sec Baseline: See  objective data Goal status:  Ongoing  05/24/2023   7.  Patient ambulates 300' with cane safely modified independent.  Baseline: See objective data Goal status:  Ongoing  05/24/2023  PLAN:  PT FREQUENCY:  2x/week  PT DURATION: 6 weeks  PLANNED INTERVENTIONS: 97164- PT Re-evaluation, 97110-Therapeutic exercises, 97530- Therapeutic activity, 97112- Neuromuscular re-education, 97535- Self Care, 74259- Manual therapy, [phone]- Gait training, [phone]- Ionotophoresis 4mg /ml Dexamethasone , Patient/Family education, Balance training, Stair training, Dry Needling, Joint mobilization, Vestibular training, DME instructions, and Physical Performance Testing  PLAN FOR NEXT SESSION: Continue with WB strengthening per symptom presentation.  Improve hip strength and quad activiation.     Laddie Math, PT, DPT 05/24/2023, 3:40 PM

## 2023-05-26 ENCOUNTER — Ambulatory Visit: Admitting: Physical Therapy

## 2023-05-26 ENCOUNTER — Encounter: Payer: Self-pay | Admitting: Physical Therapy

## 2023-05-26 DIAGNOSIS — R2689 Other abnormalities of gait and mobility: Secondary | ICD-10-CM | POA: Diagnosis not present

## 2023-05-26 DIAGNOSIS — M2560 Stiffness of unspecified joint, not elsewhere classified: Secondary | ICD-10-CM

## 2023-05-26 DIAGNOSIS — M79604 Pain in right leg: Secondary | ICD-10-CM

## 2023-05-26 DIAGNOSIS — M6281 Muscle weakness (generalized): Secondary | ICD-10-CM

## 2023-05-26 DIAGNOSIS — M79605 Pain in left leg: Secondary | ICD-10-CM

## 2023-05-26 NOTE — Therapy (Signed)
 OUTPATIENT PHYSICAL THERAPY TREATMENT   Patient Name: Terri Mckee MRN: [id number] DOB:09/09/1940, 83 y.o., female Today's Date: 05/26/2023   END OF SESSION:  PT End of Session - 05/26/23 1517     Visit Number 13    Number of Visits 20    Date for PT Re-Evaluation 06/24/23    Authorization Type [employer] Medicare    Progress Note Due on Visit 19    PT Start Time 1515    PT Stop Time 1540    PT Time Calculation (min) 25 min    Activity Tolerance Patient limited by fatigue;Patient limited by pain    Behavior During Therapy [facility] for tasks assessed/performed                         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   Cataract    Cataracts, bilateral 07/25/2015   Cervical cancer screening 07/07/2012   Colon polyps    Elevated serum creatinine    with ACE inhibitor (Normal MRA of renal arteries)   GERD (gastroesophageal reflux disease)    History of cyst of breast    Hoarseness of voice 03/13/2012   Hypertension    Lactose intolerance    causes gas   Medicare annual wellness visit, subsequent 09/02/2014   Muscle spasm of left lower extremity 03/21/2007   Qualifier: Diagnosis of  By: Marthe Slain    Osteoarthritis    Osteopenia 08/17/2014   Preventative health care 04/05/2016   Shortness of breath    Skin lesion 07/10/2012   Urinary incontinence 07/10/2012   Can't always get to bathroom on time   Vitamin D  deficiency 04/03/2016   Past Surgical History:  Procedure Laterality Date   APPENDECTOMY     BREAST CYST EXCISION Bilateral    right breast, twice   OVARY SURGERY     right ovary & tube replacement   TOTAL HIP ARTHROPLASTY Right 10/20/2022   Procedure: RIGHT TOTAL HIP ARTHROPLASTY ANTERIOR APPROACH;   Surgeon: Arnie Lao, MD;  Location: [facility] OR;  Service: Orthopedics;  Laterality: Right;  Attempted spinal converted to general   WRIST GANGLION EXCISION     rt   Patient Active Problem List   Diagnosis Date Noted   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   CRP elevated 09/01/2020   Grief at loss of child 09/01/2020   Pain in left knee 04/17/2020   Muscle weakness 03/31/2020   Myalgia 03/31/2020   Insulin  resistance 03/30/2018   Class 1 obesity with serious comorbidity and body mass index (BMI) of 31.0 to 31.9 in adult 03/16/2018   Muscle cramp, nocturnal 06/14/2017   Preventative health care 04/05/2016   Vitamin D  deficiency 04/03/2016   Cataracts, bilateral 07/25/2015   Left shoulder pain 01/29/2015   Breast cancer screening 09/02/2014   Decreased visual acuity 09/02/2014   Hearing loss 09/02/2014   Bilateral hip pain 09/02/2014   Medicare annual wellness visit, subsequent 09/02/2014   Osteopenia 08/17/2014   Right knee pain 09/24/2013   Low back pain 12/03/2012   Skin lesion 07/10/2012   Urinary incontinence 07/10/2012   Cervical cancer screening 07/07/2012  Cough 04/01/2012   Hoarseness of voice 03/13/2012   Cervical radiculopathy 12/03/2011   GERD 09/13/2009   Pain in both knees 12/21/2008   ECZEMA, 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   Hyperlipidemia, mixed 10/28/2007   INSOMNIA 10/28/2007   Allergic rhinitis 07/01/2007   Muscle cramps 03/21/2007   Essential hypertension 08/06/2006   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   PERTINENT HISTORY: Right THA 10/20/22, OA, muscle weakness, Myalgia, Vit D deficiency, osteopenia, LBP, HTN, cervical radiculopathy  PAIN:  NPRS scale:   up to  4/10 today Pain location: both knees  Pain description: 'hurting pain", sore Aggravating factors: getting up after sitting., WB Relieving factors: after moving some for knees, also resting for knees   Back 0/10 low back central  PRECAUTIONS: Fall  WEIGHT BEARING RESTRICTIONS: No  FALLS:  Has patient fallen in last 6 months? No  LIVING ENVIRONMENT: Lives with: lives with their spouse / elderly using walker Lives in: House single level Stairs: Yes: External: 4 steps; can reach both Has following equipment at home: Single point cane, Walker - 2 wheeled, Environmental consultant - 4 wheeled, Tour manager, and Grab bars  OCCUPATION: retired  PLOF: Independent using cane for limited distances in community  PATIENT GOALS: walk good,   OBJECTIVE:    Patient-Specific Activity Scoring Scheme  "0" represents "unable to perform." "10" represents "able to perform at prior level. 0 1 2 3 4 5 6 7 8 9  10 (Date and Score)   Activity 03/24/2023 04/13/23  05/13/2023  1. Clean house  1 5  5   2. Put on shoes   1  4 7   3. walking 2 8 6   4.getting in / out of car 3 6 5   5.     Score 1.75 5.75 5.75 avg   Total score = sum of the activity scores/number of activities Minimum detectable change (90%CI) for average score = 2 points Minimum detectable change (90%CI) for single activity score = 3 points  COGNITION: 03/24/2023 Overall cognitive status: WFL    SENSATION: 03/24/2023 WFL except numbness right lateral hip  MUSCLE LENGTH: 03/24/2023 Hamstrings (hips at 90* extending knee): Right -47 deg; Left -44 deg Thomas test: Right -39 deg; Left -35 deg  POSTURE:  03/24/2023 rounded shoulders, forward head, and flexed trunk   PALPATION: 03/24/2023 Tenderness  over Bil. Quad including quad & patella tendon,  lateral knee R>L,   LOWER EXTREMITY ROM:   ROM Right 03/24/2023 Left 03/24/2023  Hip flexion    Hip extension Standing A: -12* Standing A: -14*  Hip abduction    Hip adduction    Hip internal rotation    Hip external rotation    Knee flexion Seated A: 109* Seated A: 113*  Knee extension LAQ A: -17* LAQ A: -15*  Ankle dorsiflexion    Ankle plantarflexion    Ankle inversion    Ankle eversion     (Blank rows = not tested)  LOWER EXTREMITY MMT:  MMT Right 03/24/2023 Left 03/24/2023 Right 05/13/2023  Left 05/13/2023  Hip flexion   4/5 5/5  Hip extension Standing 4/5 Standing 4/5    Hip abduction Standing 4/5 Standing 4/5    Hip adduction      Hip internal rotation      Hip external rotation      Knee flexion Standing 4/5 Standing 4/5 5/5 5/5  Knee extension 4/5 4/5 5/5 5/5  Ankle dorsiflexion 5/5 5/5    Ankle plantarflexion      Ankle inversion      Ankle eversion       (Blank rows = not tested)  FUNCTIONAL TESTS:  05/13/2023: TUG with SPC Rt hand: 27.5 seconds  03/24/2023 18 inch chair transfer: significant UE use on armrests with multiple attempts. 5 times sit to/from stand: 54.57 sec pt reports no change in BLE pain from 1st to 5th.  TUG with rollator walker: 24.58 sec  GAIT: 05/13/2023:  Ambulation with SPC.  Reported FWW with store activity.  Reported walking independently at home mostly.   04/20/2023: Gait velocity with cane 2.11 ft/sec  03/24/2023 Distance walked: 150' Assistive device utilized: Environmental consultant - 4 wheeled / rollator walker Level of assistance: SBA /no balance deficits with BUE support of walker but needs cues for deviaitons Comments: gait velocity 1.99 ft/sec  decreased stride length R>L, flexed trunk, hip & knees flexed in stance,                  TODAY'S TREATMENT                                                                          DATE: 05/26/2023 Therapeutic Exercise: Nustep seat 13 lvl 5  UE/LE 10 mins for endurance, ROM Gastroc stretch BLEs incline board 30 sec hold 3 reps  Neuro Re-ed (motor control improvements, balance, neuro recruitment) Tandem stance 1 min bilateral with occasional touch Sitting on green swiss ball with BUE support on mat & chair laterally  Rolling ball ant/post, lateral & circles CW/CCW  Stepping legs out with knee ext & back knee flexion alternating LEs 10 reps  LAQ alternating LEs 10 reps  Marching 10 reps  Session ended early per pt request with thunder storm starting.    TREATMENT                                                                          DATE: 05/24/2023 Therapeutic Exercise: Nustep seat 13 lvl 5 UE/LE 10 mins for endurance, ROM Seated LAQ for ROM 2 x 10 bilateral  Seated unsupported marching alternating x 15 bilateral  Seated alternating DF/PF 10 reps 2 sets Standing alternating heel lifts x 15 reps with BUE support  Neuro Re-ed (motor control improvements, balance, neuro recruitment) Tandem stance 1 min bilateral with occasional HHA on // bars.  Seated quad set 5 sec hold x 10 bilateral - Additional time for education of techniques to improve quad activation. Seated hamstring set 5 sec hold X 10 reps bilateral. -  additional time for education on technique to improve hamstring activation.     TREATMENT                                                                          DATE: 05/19/2023 Therapeutic Exercise: Nustep lvl 5 UE/LE 10 mins for endurance, ROM Seated LAQ for ROM 2 x 10 bilateral  Seated unsupported marching alternating x 15 bilateral    Neuro Re-ed (motor control improvements, balance, neuro recruitment) Tandem stance 1 min x 2 bilateral with occasional HHA on // bars.  Seated quad set 5 sec hold x 10 bilateral - Additional time for education of techniques to improve quad activation.  Standing alternating heel/toe lifts x 15 each Feet together stance on foam with SBA 1 min, with head turns x 10       PATIENT EDUCATION:  04/05/2023:  Education details: HEP update Person educated: Patient Education method: Programmer, multimedia, Demonstration, Verbal cues, and Handouts Education comprehension: verbalized understanding, returned demonstration, and verbal cues required  HOME EXERCISE PROGRAM: Access Code: [email or web address] URL: [email or web address] Date: 04/22/2023 Prepared by: Lorie Rook  Exercises - sit to stand to sit with counter  - 1 x daily - 5 x weekly - 2 sets - 5 reps - 5 seconds hold - Supine March with Posterior Pelvic Tilt  - 1-2 x daily - 7 x weekly - 2 sets - 5-10 reps - Supine Bridge with Resistance Band  - 1-2 x daily - 7 x weekly - 1-2 sets - 5-10 reps - 2 hold - Hooklying Isometric Clamshell  - 1-2 x daily - 7 x weekly - 2-3 sets - 10-15 reps - Seated Quad Set  - 2-3 x daily - 7 x weekly - 1 sets - 10 reps - 5 hold - Standing Hip Flexion with Resistance (Mirrored)  - 1 x daily - 7 x weekly - 1 sets - 10 reps - Standing Hip Adduction with Resistance (Mirrored)  - 1 x daily - 7 x weekly - 1 sets - 10 reps - Standing Hip Extension with Resistance  - 1 x daily - 7 x weekly - 1 sets - 10 reps - Standing Hip Abduction with Theraband Resistance  - 1 x daily - 7 x weekly - 1 sets - 10 reps   ASSESSMENT: CLINICAL IMPRESSION: Patient's pain seemed better today. She tolerated standing activities without increase in pain.  Pt progressed seated activities to be performed on Swiss ball to engage core muscles.  Patient continues to benefit from skilled PT services indicated at this time.   OBJECTIVE IMPAIRMENTS: Abnormal gait, decreased activity tolerance, decreased balance, decreased knowledge of condition, decreased knowledge of use of DME, decreased mobility, difficulty walking, decreased ROM, decreased strength, impaired flexibility, postural dysfunction, obesity, and pain.   ACTIVITY LIMITATIONS: carrying, lifting, bending, standing, squatting, stairs, transfers, and  locomotion level  PARTICIPATION LIMITATIONS: community activity  PERSONAL FACTORS: Age, Fitness, Time since onset of injury/illness/exacerbation, and 3+ comorbidities: see PMH are also affecting patient's functional outcome.   REHAB POTENTIAL: Good  CLINICAL DECISION MAKING: Stable/uncomplicated  EVALUATION COMPLEXITY: Low   GOALS: Goals reviewed with patient? Yes  SHORT TERM GOALS: (target date for Short term goals 04/16/2023)   1.  Patient will  demonstrate independent use of home exercise program to maintain progress from in clinic treatments. Baseline: See objective data Goal status: MET 04/13/2023  2. Patient reports bilateral low extremity pain improved 25% since evaluation Baseline: See objective data Goal status: Met 04/13/2023  LONG TERM GOALS: (target dates for all long term goals 06/24/2023 )   1. Patient will demonstrate/report pain less than or equal to 2/10 to facilitate minimal limitation in daily activity secondary to pain symptoms. Baseline: See objective data Goal status: revised on 05/13/2023   2. Patient will demonstrate independent use of home exercise program to facilitate ability to maintain/progress functional gains from skilled physical therapy services. Baseline: See objective data Goal status: Ongoing  05/24/2023  3.  Patient reports Patient-Specific Activity Score improved the average by 3 to indicate improvement in functional activities.  Baseline: SEE OBJECTIVE DATA Goal status:  Ongoing  05/24/2023   4.  Patient will demonstrate bilateral LE MMT >4/5 throughout to faciltiate usual transfers, stairs, squatting at [facility] for daily life.  Baseline: See objective data Goal status:  Ongoing  05/24/2023   5.  Timed Up & Go with FWW < 15 sec Baseline: See objective data Goal status:  Ongoing  05/24/2023   6.  5 times sit to/from stand <40 sec Baseline: See objective data Goal status:  Ongoing  05/24/2023   7.  Patient ambulates 300' with cane safely modified  independent.  Baseline: See objective data Goal status:  Ongoing  05/24/2023  PLAN:  PT FREQUENCY:  2x/week  PT DURATION: 6 weeks  PLANNED INTERVENTIONS: 97164- PT Re-evaluation, 97110-Therapeutic exercises, 97530- Therapeutic activity, 97112- Neuromuscular re-education, 97535- Self Care, 45409- Manual therapy, [phone]- Gait training, [phone]- Ionotophoresis 4mg /ml Dexamethasone , Patient/Family education, Balance training, Stair training, Dry Needling, Joint mobilization, Vestibular training, DME instructions, and Physical Performance Testing  PLAN FOR NEXT SESSION:  continue work towards LTGs,    Continue with WB strengthening per symptom presentation.  Improve hip strength and quad activiation.     Aithan Farrelly, PT, DPT 05/26/2023, 3:47 PM

## 2023-05-31 ENCOUNTER — Encounter: Payer: Self-pay | Admitting: Rehabilitative and Restorative Service Providers"

## 2023-05-31 ENCOUNTER — Ambulatory Visit: Admitting: Rehabilitative and Restorative Service Providers"

## 2023-05-31 DIAGNOSIS — M79604 Pain in right leg: Secondary | ICD-10-CM | POA: Diagnosis not present

## 2023-05-31 DIAGNOSIS — M79605 Pain in left leg: Secondary | ICD-10-CM

## 2023-05-31 DIAGNOSIS — M6281 Muscle weakness (generalized): Secondary | ICD-10-CM

## 2023-05-31 DIAGNOSIS — R2689 Other abnormalities of gait and mobility: Secondary | ICD-10-CM | POA: Diagnosis not present
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
## 2023-06-02 ENCOUNTER — Ambulatory Visit: Admitting: Rehabilitative and Restorative Service Providers"

## 2023-06-02 ENCOUNTER — Encounter: Payer: Self-pay | Admitting: Rehabilitative and Restorative Service Providers"

## 2023-06-02 DIAGNOSIS — M2560 Stiffness of unspecified joint, not elsewhere classified: Secondary | ICD-10-CM

## 2023-06-02 DIAGNOSIS — R2689 Other abnormalities of gait and mobility: Secondary | ICD-10-CM

## 2023-06-02 DIAGNOSIS — M79604 Pain in right leg: Secondary | ICD-10-CM

## 2023-06-02 DIAGNOSIS — M6281 Muscle weakness (generalized): Secondary | ICD-10-CM | POA: Diagnosis not present

## 2023-06-02 DIAGNOSIS — M79605 Pain in left leg: Secondary | ICD-10-CM | POA: Diagnosis not present
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
## 2023-06-08 ENCOUNTER — Encounter: Admitting: Physical Therapy

## 2023-06-10 ENCOUNTER — Encounter: Payer: Self-pay | Admitting: Physical Therapy

## 2023-06-10 ENCOUNTER — Ambulatory Visit: Admitting: Physical Therapy

## 2023-06-10 DIAGNOSIS — M6281 Muscle weakness (generalized): Secondary | ICD-10-CM

## 2023-06-10 DIAGNOSIS — R2689 Other abnormalities of gait and mobility: Secondary | ICD-10-CM

## 2023-06-10 DIAGNOSIS — M79605 Pain in left leg: Secondary | ICD-10-CM | POA: Diagnosis not present

## 2023-06-10 DIAGNOSIS — M2560 Stiffness of unspecified joint, not elsewhere classified: Secondary | ICD-10-CM

## 2023-06-10 DIAGNOSIS — M79604 Pain in right leg: Secondary | ICD-10-CM

## 2023-06-10 NOTE — Therapy (Addendum)
 OUTPATIENT PHYSICAL THERAPY TREATMENT   Patient Name: Terri Mckee MRN: [id number] DOB:07-13-1940, 83 y.o., female Today's Date: 06/10/2023  PHYSICAL THERAPY DISCHARGE SUMMARY  Visits from Start of Care: 16  Current functional level related to goals / functional outcomes: See below   Remaining deficits: See below   Education / Equipment: Patient appeared to understand HEP.    Patient agrees to discharge. Patient goals were unknown as did return to PT. Patient is being discharged due to not returning since the last visit.   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   Cataract    Cataracts, bilateral 07/25/2015   Cervical cancer screening 07/07/2012   Colon polyps    Elevated serum creatinine    with ACE inhibitor (Normal MRA of renal arteries)   GERD (gastroesophageal reflux disease)    History of cyst of breast    Hoarseness of voice 03/13/2012   Hypertension    Lactose intolerance    causes gas   Medicare annual wellness visit, subsequent 09/02/2014   Muscle spasm of left lower extremity 03/21/2007   Qualifier: Diagnosis of  By: [name]    Osteoarthritis    Osteopenia 08/17/2014   Preventative health care  04/05/2016   Shortness of breath    Skin lesion 07/10/2012   Urinary incontinence 07/10/2012   Can't always get to bathroom on time   Vitamin D  deficiency 04/03/2016   Past Surgical History:  Procedure Laterality Date   APPENDECTOMY     BREAST CYST EXCISION Bilateral    right breast, twice   OVARY SURGERY     right ovary & tube replacement   TOTAL HIP ARTHROPLASTY Right 10/20/2022   Procedure: RIGHT TOTAL HIP ARTHROPLASTY ANTERIOR APPROACH;  Surgeon: Vernetta Lonni GRADE, MD;  Location: [facility] OR;  Service: Orthopedics;  Laterality: Right;  Attempted spinal converted to general   WRIST GANGLION EXCISION     rt   Patient Active Problem List   Diagnosis Date Noted   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   CRP elevated 09/01/2020   Grief at loss of child 09/01/2020   Pain in left knee 04/17/2020   Muscle weakness 03/31/2020   Myalgia 03/31/2020   Insulin  resistance 03/30/2018   Class 1 obesity with serious comorbidity and body mass index (BMI) of 31.0 to 31.9 in adult 03/16/2018   Muscle cramp, nocturnal 06/14/2017   Preventative health care 04/05/2016  Vitamin D  deficiency 04/03/2016   Cataracts, bilateral 07/25/2015   Left shoulder pain 01/29/2015   Breast cancer screening 09/02/2014   Decreased visual acuity 09/02/2014   Hearing loss 09/02/2014   Bilateral hip pain 09/02/2014   Medicare annual wellness visit, subsequent 09/02/2014   Osteopenia 08/17/2014   Right knee pain 09/24/2013   Low back pain 12/03/2012   Skin lesion 07/10/2012   Urinary incontinence 07/10/2012   Cervical cancer screening 07/07/2012   Cough 04/01/2012   Hoarseness of voice 03/13/2012   Cervical radiculopathy 12/03/2011   GERD 09/13/2009   Pain in both knees 12/21/2008   ECZEMA, 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   Hyperlipidemia, mixed 10/28/2007   INSOMNIA 10/28/2007   Allergic rhinitis 07/01/2007   Muscle cramps 03/21/2007   Essential hypertension 08/06/2006   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  Knees and sides of thighs are biggest issue.  It keeps her up at night.   PERTINENT HISTORY: Right THA 10/20/22, OA, muscle weakness, Myalgia, Vit D deficiency, osteopenia, LBP, HTN, cervical radiculopathy  PAIN:  NPRS scale:  at worst in last 24 hours :   4/10 Pain location: both knees  Pain description: 'hurting pain, sore Aggravating factors: getting up after sitting., WB Relieving factors: after moving some for knees, also resting for knees   Back  3/10 low back central  PRECAUTIONS: Fall  WEIGHT BEARING RESTRICTIONS: No  FALLS:  Has patient fallen in last 6 months? No  LIVING ENVIRONMENT: Lives with: lives with their spouse / elderly using walker Lives in: House single level Stairs: Yes: External: 4 steps; can reach both Has following equipment at home: Single point cane, Walker - 2 wheeled, Environmental consultant - 4 wheeled, Tour manager, and Grab bars  OCCUPATION: retired  PLOF: Independent using cane for limited distances in community  PATIENT GOALS: walk good,   OBJECTIVE:    Patient-Specific Activity Scoring Scheme  0 represents "unable to perform." 10 represents "able to perform at prior level. 0 1 2 3 4 5 6 7 8 9  10 (Date and Score)   Activity 03/24/2023 04/13/23  05/13/2023  1. Clean house  1 5  5   2. Put on shoes   1  4 7   3. walking 2 8 6   4.getting in / out of car 3 6 5   5.     Score 1.75 5.75 5.75 avg   Total score = sum of the activity scores/number  of activities Minimum detectable change (90%CI) for average score = 2 points Minimum detectable change (90%CI) for single activity score = 3 points  COGNITION: 03/24/2023 Overall cognitive status: WFL    SENSATION: 03/24/2023 WFL except numbness right lateral hip  MUSCLE LENGTH: 03/24/2023 Hamstrings (hips at 90* extending knee): Right -47 deg; Left -44 deg Thomas test: Right -39 deg; Left -35 deg  POSTURE:  03/24/2023 rounded shoulders, forward head, and flexed trunk   PALPATION: 03/24/2023 Tenderness over Bil. Quad including quad & patella tendon,  lateral knee R>L,   LOWER EXTREMITY ROM:  ROM Right 03/24/2023 Left 03/24/2023  Hip flexion    Hip extension Standing A: -12* Standing A: -14*  Hip abduction    Hip adduction    Hip internal rotation    Hip external rotation    Knee flexion Seated A: 109* Seated A: 113*  Knee extension LAQ A: -17* LAQ A: -15*  Ankle dorsiflexion    Ankle plantarflexion    Ankle inversion    Ankle eversion     (Blank rows = not tested)  LOWER EXTREMITY MMT:  MMT Right 03/24/2023 Left 03/24/2023 Right 05/13/2023 Left 05/13/2023 Right 05/31/2023 Left 05/31/2023  Hip flexion   4/5 5/5    Hip extension Standing 4/5 Standing 4/5      Hip abduction Standing 4/5 Standing 4/5   2+/5 3+/5  Hip adduction        Hip internal rotation        Hip external rotation        Knee flexion Standing 4/5 Standing 4/5 5/5 5/5    Knee extension 4/5 4/5 5/5 5/5    Ankle dorsiflexion 5/5 5/5      Ankle plantarflexion        Ankle inversion        Ankle eversion         (Blank rows = not tested)  FUNCTIONAL TESTS:  05/13/2023: TUG with SPC Rt hand: 27.5 seconds  03/24/2023 18 inch chair transfer: significant UE use on armrests with multiple attempts. 5 times sit to/from stand: 54.57 sec pt reports no change in BLE pain from 1st to 5th.  TUG with rollator walker: 24.58 sec  GAIT: 05/13/2023:  Ambulation with SPC.  Reported FWW with store  activity.  Reported walking independently at home mostly.   04/20/2023: Gait velocity with cane 2.11 ft/sec  03/24/2023 Distance walked: 150' Assistive device utilized: Environmental consultant - 4 wheeled / rollator walker Level of assistance: SBA /no balance deficits with BUE support of walker but needs cues for deviaitons Comments: gait velocity 1.99 ft/sec  decreased stride length R>L, flexed trunk, hip & knees flexed in stance,                                    TODAY'S TREATMENT                                                                          DATE: 06/10/2023 Therapeutic Exercise: Nustep seat 13 lvl 5 UE/LE 10 mins for endurance, ROM Gastroc stretch BLEs incline board 30 sec hold 3 reps  Self-Care: PT demo & verbal cues on positioning in bed using pillows to tent sheets off feet and align upper leg.  Pt also reports needs new mattress so PT educated on importance of mattress for supporting.  This can be related to some of her pain.  PT also reviewed shoe support & cushion are factors of LE & back pain.  Pt verbalized understanding.   Manual: Percussive device to Lt and Rt lumbar QL, paraspinals & Piriformis musculature due to symptoms indicated upon arrival.  Frozen water bottle soft tissue to ITB bilaterally.  Manual stretch for iliotibial band and piriformis muscle bilaterally  TREATMENT                                                                          DATE: 06/02/2023 Therapeutic Exercise: Nustep seat 13 lvl 5 UE/LE 10 mins for endurance, ROM   Neuro Re-ed (to improve neuromuscular coordination/recruitment) Standing alt toe tapping on 4 inch step x 10 bilateral with SBA and no UE assist Tandem stance 1 min x 2 bilateral with occasional to moderate HHA on bars, SBA  TherActivity (to improve transfers, ambulation, stairs) Leg press double leg in available range for knee to not cause pain:  x 15 50 lbs  Step up 4 inch step with single hand assist on bar x 10 bilateral with  SBA   Manual: Percussive device to Lt and Rt lumbar QL, paraspinals musculature due to symptoms indicated upon arrival.     TREATMENT                                                                          DATE: 05/31/2023 Therapeutic Exercise: Nustep seat 13 lvl 5 UE/LE 10 mins for endurance, ROM Sidelying clam shell 2 x 15 bilaterally  Supine marching x 10 bilateral  Supine bridge 2-3 sec hold x 15 Standing lumbar extension AROM x 5   Neuro Re-ed (to improve neuromuscular coordination/recruitment) Supine quad set bilateral 5 sec hold x 10  Standing church pew anterior/posterior weight shifting x 15 c SBA.  Additional time spent in education and demonstration of activity to improve performance.    Manual: Percussive device to Lt and Rt lumbar QL, paraspinals musculature due to symptoms indicated upon arrival.     PATIENT EDUCATION:  04/05/2023:  Education details: HEP update Person educated: Patient Education method: Programmer, multimedia, Demonstration, Verbal cues, and Handouts Education comprehension: verbalized understanding, returned demonstration, and verbal cues required  HOME EXERCISE PROGRAM: Access Code: [email or web address] URL: [email or web address] Date: 04/22/2023 Prepared by: Grayce Spatz  Exercises - sit to stand to sit with counter  - 1 x daily - 5 x weekly - 2 sets - 5 reps - 5 seconds hold - Supine March with Posterior Pelvic Tilt  - 1-2 x daily - 7 x weekly - 2 sets - 5-10 reps - Supine Bridge with Resistance Band  - 1-2 x daily - 7 x weekly - 1-2 sets - 5-10 reps - 2 hold - Hooklying Isometric Clamshell  - 1-2 x daily - 7 x weekly - 2-3 sets - 10-15 reps - Seated Quad Set  - 2-3 x daily - 7 x weekly - 1 sets - 10 reps - 5 hold - Standing Hip Flexion with Resistance (Mirrored)  - 1 x daily - 7 x weekly - 1 sets - 10 reps - Standing Hip Adduction with Resistance (Mirrored)  - 1 x daily - 7 x weekly - 1 sets - 10 reps - Standing Hip Extension with Resistance   - 1 x daily - 7 x weekly - 1 sets -  10 reps - Standing Hip Abduction with Theraband Resistance  - 1 x daily - 7 x weekly - 1 sets - 10 reps   ASSESSMENT: CLINICAL IMPRESSION: Patient continues to be limited by lower extremity and back pain. She does report this is overall improving but continues to wax and wane.  PT educated patient on positioning in bed and the benefits to having a good mattress & shoes that are supportive.  Patient did verbalize understanding.  OBJECTIVE IMPAIRMENTS: Abnormal gait, decreased activity tolerance, decreased balance, decreased knowledge of condition, decreased knowledge of use of DME, decreased mobility, difficulty walking, decreased ROM, decreased strength, impaired flexibility, postural dysfunction, obesity, and pain.   ACTIVITY LIMITATIONS: carrying, lifting, bending, standing, squatting, stairs, transfers, and locomotion level  PARTICIPATION LIMITATIONS: community activity  PERSONAL FACTORS: Age, Fitness, Time since onset of injury/illness/exacerbation, and 3+ comorbidities: see PMH are also affecting patient's functional outcome.   REHAB POTENTIAL: Good  CLINICAL DECISION MAKING: Stable/uncomplicated  EVALUATION COMPLEXITY: Low   GOALS: Goals reviewed with patient? Yes  SHORT TERM GOALS: (target date for Short term goals 04/16/2023)   1.  Patient will demonstrate independent use of home exercise program to maintain progress from in clinic treatments. Baseline: See objective data Goal status: MET 04/13/2023  2. Patient reports bilateral low extremity pain improved 25% since evaluation Baseline: See objective data Goal status: Met 04/13/2023  LONG TERM GOALS: (target dates for all long term goals 06/24/2023 )   1. Patient will demonstrate/report pain less than or equal to 2/10 to facilitate minimal limitation in daily activity secondary to pain symptoms. Baseline: See objective data Goal status: on going 06/10/2023   2. Patient will demonstrate  independent use of home exercise program to facilitate ability to maintain/progress functional gains from skilled physical therapy services. Baseline: See objective data Goal status: on going 06/10/2023  3.  Patient reports Patient-Specific Activity Score improved the average by 3 to indicate improvement in functional activities.  Baseline: SEE OBJECTIVE DATA Goal status:  on going 06/02/2023   4.  Patient will demonstrate bilateral LE MMT >4/5 throughout to faciltiate usual transfers, stairs, squatting at [facility] for daily life.  Baseline: See objective data Goal status:  on going 06/10/2023   5.  Timed Up & Go with FWW < 15 sec Baseline: See objective data Goal status:  on going 06/10/2023   6.  5 times sit to/from stand <40 sec Baseline: See objective data Goal status:  on going 06/10/2023   7.  Patient ambulates 300' with cane safely modified independent.  Baseline: See objective data Goal status:  on going 06/10/2023  PLAN:  PT FREQUENCY:  2x/week  PT DURATION: 6 weeks  PLANNED INTERVENTIONS: 97164- PT Re-evaluation, 97110-Therapeutic exercises, 97530- Therapeutic activity, 97112- Neuromuscular re-education, 97535- Self Care, [id number]- Manual therapy, [phone]- Gait training, [phone]- Ionotophoresis 4mg /ml Dexamethasone , Patient/Family education, Balance training, Stair training, Dry Needling, Joint mobilization, Vestibular training, DME instructions, and Physical Performance Testing  PLAN FOR NEXT SESSION:   Begin to check LTG's.  Check of PT recommendations for positioning in bed are improving pain especially in the mornings.   WB strengthening, quad/hip strengthening.  Percussive device as needed.     Grayce Spatz, PT, DPT 06/10/2023, 1:57 PM

## 2023-06-13 NOTE — Assessment & Plan Note (Signed)
 Hydrate and monitor

## 2023-06-13 NOTE — Assessment & Plan Note (Signed)
 hgba1c acceptable, minimize simple carbs. Increase exercise as tolerated.

## 2023-06-13 NOTE — Assessment & Plan Note (Signed)
 Encourage heart healthy diet such as MIND or DASH diet, increase exercise, avoid trans fats, simple carbohydrates and processed foods, consider a krill or fish or flaxseed oil cap daily.

## 2023-06-13 NOTE — Assessment & Plan Note (Signed)
 Supplement and monitor

## 2023-06-13 NOTE — Assessment & Plan Note (Signed)
 Well controlled, no changes to meds. Encouraged heart healthy diet such as the DASH diet and exercise as tolerated.

## 2023-06-14 ENCOUNTER — Encounter: Payer: Self-pay | Admitting: Family Medicine

## 2023-06-14 ENCOUNTER — Ambulatory Visit (INDEPENDENT_AMBULATORY_CARE_PROVIDER_SITE_OTHER): Payer: Medicare Other | Admitting: Family Medicine

## 2023-06-14 VITALS — BP 126/58 | HR 60 | Resp 16 | Ht 69.0 in | Wt 233.2 lb

## 2023-06-14 DIAGNOSIS — I10 Essential (primary) hypertension: Secondary | ICD-10-CM | POA: Diagnosis not present

## 2023-06-14 DIAGNOSIS — E559 Vitamin D deficiency, unspecified: Secondary | ICD-10-CM | POA: Diagnosis not present

## 2023-06-14 DIAGNOSIS — Z23 Encounter for immunization: Secondary | ICD-10-CM | POA: Diagnosis not present

## 2023-06-14 DIAGNOSIS — R7982 Elevated C-reactive protein (CRP): Secondary | ICD-10-CM

## 2023-06-14 DIAGNOSIS — E782 Mixed hyperlipidemia: Secondary | ICD-10-CM

## 2023-06-14 DIAGNOSIS — R252 Cramp and spasm: Secondary | ICD-10-CM | POA: Diagnosis not present

## 2023-06-14 DIAGNOSIS — E88819 Insulin resistance, unspecified: Secondary | ICD-10-CM

## 2023-06-14 NOTE — Patient Instructions (Addendum)
 Shingrix is the new shingles shot, 2 shots over 2-6 months, confirm coverage with insurance and document, then can return here for shots with nurse appt or at pharmacy  RSV, Respiratory Syncitial Virus Vaccine, Arexvy at pharmacy  Check about Silver Sneakers for using the pool and chair yoga

## 2023-06-14 NOTE — Progress Notes (Signed)
 Subjective:    Patient ID: Terri Mckee, female    DOB: Jul 17, 1940, 83 y.o.   MRN: [id number]  Chief Complaint  Patient presents with   Medical Management of Chronic Issues    Patient presents today for 4 month follow-up   Quality Metric Gaps    Zoster    HPI Discussed the use of AI scribe software for clinical note transcription with the patient, who gave verbal consent to proceed.  History of Present Illness Terri Laumann "Dot" is an 83 year old female who presents with persistent leg weakness and pain following hip replacement surgery.  She has been experiencing ongoing leg weakness despite two months of physical therapy. There has been no improvement in leg strength, and she is exploring additional methods to improve her condition, such as using an exercise device and considering aquatic therapy and chair yoga.  Following her hip replacement surgery, she developed knee problems that were not present prior to the surgery. Her knees feel weak and are 'giving out', particularly on the sides. She also reports arthritis in both knees and lower back, which affects her shoulder and arm movement.  She experiences significant discomfort due to her mattress, which has become too soft, causing hip and back pain. This discomfort disrupts her sleep, often forcing her to sit up at night.  Her current medications include magnesium, which she notes is not working effectively. She feels that therapy has not been beneficial in improving her strength.  She has a history of elevated C-reactive protein (CRP), a marker for inflammation, which was noted during a previous evaluation.    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   Cataract     Cataracts, bilateral 07/25/2015   Cervical cancer screening 07/07/2012   Colon polyps    Elevated serum creatinine    with ACE inhibitor (Normal MRA of renal arteries)   GERD (gastroesophageal reflux disease)    History of cyst of breast    Hoarseness of voice 03/13/2012   Hypertension    Lactose intolerance    causes gas   Medicare annual wellness visit, subsequent 09/02/2014   Muscle spasm of left lower extremity 03/21/2007   Qualifier: Diagnosis of  By: Marthe Slain    Osteoarthritis    Osteopenia 08/17/2014   Preventative health care 04/05/2016   Shortness of breath    Skin lesion 07/10/2012   Urinary incontinence 07/10/2012   Can't always get to bathroom on time   Vitamin D  deficiency 04/03/2016    Past Surgical History:  Procedure Laterality Date   APPENDECTOMY     BREAST CYST EXCISION Bilateral    right breast, twice   OVARY SURGERY     right ovary & tube replacement   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   Diabetes Maternal Aunt    Diabetes Maternal Aunt    Seizures Maternal Aunt    Birth defects Maternal Aunt    Lupus Grandchild    Sickle cell anemia Grandchild    Colon cancer Neg Hx    Breast cancer Neg Hx     Social History   Socioeconomic History   Marital status: Married    Spouse name: Myrtie Atkinson   Number of children: 0   Years of education: Not on file    Highest education level: Not on file  Occupational History   Occupation: retired  Tobacco Use   Smoking status: Former    Current packs/day: 0.00    Types: Cigarettes    Quit date: 01/13/1991    Years since quitting: 32.4   Smokeless tobacco: Never   Tobacco comments:    quit 20 years ago 1/2 ppd x 40 years  Vaping Use   Vaping status: Never Used  Substance and Sexual Activity   Alcohol use: No   Drug use: No   Sexual activity: Never    Comment: lives with husband, no dietary restrictions,   Other Topics Concern   Not on file  Social History Narrative   Former Smoker - quit 30 years ago (1/2 ppd x 40 years) Daughter passed away in 12-Jul-2020 d/t kidney cancer       Social Drivers of Corporate investment banker Strain: Low Risk  (02/04/2023)   Overall Financial Resource Strain (CARDIA)    Difficulty of Paying Living Expenses: Not hard at all  Food Insecurity: No Food Insecurity (02/04/2023)   Hunger Vital Sign    Worried About Running Out of Food in the Last Year: Never true    Ran Out of Food in the Last Year: Never true  Transportation Needs: No Transportation Needs (02/04/2023)   PRAPARE - Administrator, Civil Service (Medical): No    Lack of Transportation (Non-Medical): No  Physical Activity: Sufficiently Active (02/04/2023)   Exercise Vital Sign    Days of Exercise per Week: 5 days    Minutes of Exercise per Session: 30 min  Stress: No Stress Concern Present (02/04/2023)   [employer] of Occupational Health - Occupational Stress Questionnaire    Feeling of Stress : Not at all  Social Connections: Socially Integrated (02/04/2023)   Social Connection and Isolation Panel [NHANES]    Frequency of Communication with Friends and Family: More than three times a week    Frequency of Social Gatherings with Friends and Family: More than three times a week    Attends Religious Services: More than 4 times per year    Active Member of [employer] or Organizations: Yes     Attends Banker Meetings: More than 4 times per year    Marital Status: Married  Catering manager Violence: Not At Risk (02/04/2023)   Humiliation, Afraid, Rape, and Kick questionnaire    Fear of Current or Ex-Partner: No    Emotionally Abused: No    Physically Abused: No  Sexually Abused: No    Outpatient Medications Prior to Visit  Medication Sig Dispense Refill   acetaminophen  (TYLENOL ) 650 MG CR tablet Take 650 mg by mouth in the morning, at noon, and at bedtime.     albuterol  (VENTOLIN  HFA) 108 (90 Base) MCG/ACT inhaler Inhale 2 puffs into the lungs every 6 (six) hours as needed for wheezing or shortness of breath. 18 g 5   aspirin  81 MG chewable tablet Chew 1 tablet (81 mg total) by mouth 2 (two) times daily. 30 tablet 0   Blood Glucose Monitoring Suppl DEVI 1 each by Does not apply route in the morning, at noon, and at bedtime. May substitute to any manufacturer covered by patient's insurance. 1 each 0   calcium carbonate (OS-CAL) 600 MG TABS Take 600 mg by mouth in the morning.     Cholecalciferol (VITAMIN D3 PO) Take 1 tablet by mouth in the morning.     famotidine  (PEPCID ) 20 MG tablet TAKE 1 TABLET(20 MG) BY MOUTH TWICE DAILY 180 tablet 0   felodipine  (PLENDIL ) 10 MG 24 hr tablet TAKE 1 TABLET(10 MG) BY MOUTH DAILY 90 tablet 1   GARLIC PO Take 1 capsule by mouth in the morning.     Homeopathic Products (LEG CRAMPS SL) Place 3 tablets under the tongue at bedtime.     HYDROcodone -acetaminophen  (NORCO/VICODIN) 5-325 MG tablet Take 1-2 tablets by mouth every 6 (six) hours as needed for moderate pain (pain score 4-6). 30 tablet 0   Mometasone  Furo-Formoterol  Fum 50-5 MCG/ACT AERO Inhale 2 puffs into the lungs 2 (two) times daily. 1 each 5   Multiple Vitamin (MULTIVITAMIN WITH MINERALS) TABS tablet Take 1 tablet by mouth in the morning.     Olopatadine HCl (PATADAY OP) Place 1 drop into both eyes 2 (two) times daily as needed (allergy/irritated eyes.).     Omega-3 Fatty  Acids (OMEGA 3 PO) Take 1 capsule by mouth in the morning and at bedtime. OmegaXL     POTASSIUM PO Take 1 tablet by mouth in the morning.     Vibegron (GEMTESA) 75 MG TABS Take 75 mg by mouth daily in the afternoon.     XYZAL  5 MG tablet Take 1 tablet (5 mg total) by mouth every evening. 90 tablet 3   No facility-administered medications prior to visit.    Allergies  Allergen Reactions   Allegra [Fexofenadine] Shortness Of Breath   Haemophilus Influenzae Vaccines Nausea And Vomiting   Erythromycin Nausea Only   Oxycodone-Acetaminophen  Nausea And Vomiting   Propoxyphene N-Acetaminophen  Nausea And Vomiting      severe vomiting   Zocor [Simvastatin] Other (See Comments)      Myalgia    Review of Systems  Constitutional:  Positive for malaise/fatigue. Negative for fever.  HENT:  Negative for congestion.   Eyes:  Negative for blurred vision.  Respiratory:  Negative for shortness of breath.   Cardiovascular:  Negative for chest pain, palpitations and leg swelling.  Gastrointestinal:  Negative for abdominal pain, blood in stool and nausea.  Genitourinary:  Negative for dysuria and frequency.  Musculoskeletal:  Positive for back pain and joint pain. Negative for falls.  Skin:  Negative for rash.  Neurological:  Positive for weakness. Negative for dizziness, loss of consciousness and headaches.  Endo/Heme/Allergies:  Negative for environmental allergies.  Psychiatric/Behavioral:  Negative for depression. The patient is not nervous/anxious.        Objective:     Physical Exam Constitutional:      General:  She is not in acute distress.    Appearance: Normal appearance. She is well-developed. She is not toxic-appearing.  HENT:     Head: Normocephalic and atraumatic.     Right Ear: External ear normal.     Left Ear: External ear normal.     Nose: Nose normal.  Eyes:     General:        Right eye: No discharge.        Left eye: No discharge.     Conjunctiva/sclera: Conjunctivae  normal.  Neck:     Thyroid : No thyromegaly.  Cardiovascular:     Rate and Rhythm: Normal rate and regular rhythm.     Heart sounds: Normal heart sounds. No murmur heard. Pulmonary:     Effort: Pulmonary effort is normal. No respiratory distress.     Breath sounds: Normal breath sounds.  Abdominal:     General: Bowel sounds are normal.     Palpations: Abdomen is soft.     Tenderness: There is no abdominal tenderness. There is no guarding.  Musculoskeletal:        General: Normal range of motion.     Cervical back: Neck supple.  Lymphadenopathy:     Cervical: No cervical adenopathy.  Skin:    General: Skin is warm and dry.  Neurological:     Mental Status: She is alert and oriented to person, place, and time.  Psychiatric:        Mood and Affect: Mood normal.        Behavior: Behavior normal.        Thought Content: Thought content normal.        Judgment: Judgment normal.     BP (!) 126/58   Pulse 60   Resp 16   Ht 5\' 9"  (1.753 m)   Wt 233 lb 3.2 oz (105.8 kg)   LMP  (LMP Unknown)   SpO2 97%   BMI 34.44 kg/m  Wt Readings from Last 3 Encounters:  06/14/23 233 lb 3.2 oz (105.8 kg)  02/04/23 231 lb (104.8 kg)  01/26/23 231 lb 9.6 oz (105.1 kg)    Diabetic Foot Exam - Simple   No data filed    Lab Results  Component Value Date   WBC 6.5 01/26/2023   HGB 12.9 01/26/2023   HCT 39.8 01/26/2023   PLT 216.0 01/26/2023   GLUCOSE 76 01/26/2023   CHOL 209 (H) 01/26/2023   TRIG 107.0 01/26/2023   HDL 46.90 01/26/2023   LDLCALC 140 (H) 01/26/2023   ALT 10 01/26/2023   AST 13 01/26/2023   NA 139 01/26/2023   K 3.6 01/26/2023   CL 102 01/26/2023   CREATININE 1.27 (H) 01/26/2023   BUN 16 01/26/2023   CO2 26 01/26/2023   TSH 2.77 01/26/2023   HGBA1C 5.5 01/26/2023    Lab Results  Component Value Date   TSH 2.77 01/26/2023   Lab Results  Component Value Date   WBC 6.5 01/26/2023   HGB 12.9 01/26/2023   HCT 39.8 01/26/2023   MCV 88.8 01/26/2023   PLT 216.0  01/26/2023   Lab Results  Component Value Date   NA 139 01/26/2023   K 3.6 01/26/2023   CO2 26 01/26/2023   GLUCOSE 76 01/26/2023   BUN 16 01/26/2023   CREATININE 1.27 (H) 01/26/2023   BILITOT 0.4 01/26/2023   ALKPHOS 59 01/26/2023   AST 13 01/26/2023   ALT 10 01/26/2023   PROT 6.9 01/26/2023   ALBUMIN 4.1 01/26/2023  CALCIUM 9.1 01/26/2023   ANIONGAP 10 10/21/2022   EGFR 42.0 01/29/2022   GFR 39.26 (L) 01/26/2023   Lab Results  Component Value Date   CHOL 209 (H) 01/26/2023   Lab Results  Component Value Date   HDL 46.90 01/26/2023   Lab Results  Component Value Date   LDLCALC 140 (H) 01/26/2023   Lab Results  Component Value Date   TRIG 107.0 01/26/2023   Lab Results  Component Value Date   CHOLHDL 4 01/26/2023   Lab Results  Component Value Date   HGBA1C 5.5 01/26/2023       Assessment & Plan:  Essential hypertension Assessment & Plan: Well controlled, no changes to meds. Encouraged heart healthy diet such as the DASH diet and exercise as tolerated.   Orders: -     Comprehensive metabolic panel with GFR -     CBC with Differential/Platelet -     TSH  Hyperlipidemia, mixed Assessment & Plan: Encourage heart healthy diet such as MIND or DASH diet, increase exercise, avoid trans fats, simple carbohydrates and processed foods, consider a krill or fish or flaxseed oil cap daily.   Orders: -     Lipid panel  Insulin  resistance Assessment & Plan: hgba1c acceptable, minimize simple carbs. Increase exercise as tolerated.   Orders: -     Hemoglobin A1c  Muscle cramps Assessment & Plan: Hydrate and monitor   Orders: -     Magnesium  Vitamin D  deficiency Assessment & Plan: Supplement and monitor  Orders: -     VITAMIN D  25 Hydroxy (Vit-D Deficiency, Fractures)  Elevated C-reactive protein (CRP) -     High sensitivity CRP    Assessment and Plan Assessment & Plan Knee arthritis Bilateral knee arthritis with chronic pain, weakness, and  instability. Previous therapy ineffective. Consider alternative therapies for strength and balance. - Continue physical therapy. - Consider aquatic therapy and chair yoga. - Explore [employer]ne[employer] program for access to resources. - Evaluate mattress firmness for mobility and comfort.  Chronic pain of knees and hip Chronic pain in knees and hip likely due to arthritis, affecting mobility and sleep. Mattress firmness may aid pain management. - Continue current pain management. - Consider aquatic therapy and chair yoga. - Evaluate and potentially replace mattress for support.  Insomnia Insomnia linked to chronic pain and soft mattress, affecting sleep quality and strength maintenance. - Evaluate and potentially replace mattress. - Encourage regular sleep schedule and adequate sleep duration.  General Health Maintenance Due for Prevnar 20, shingles, and RSV vaccinations. Vaccines recommended for pneumococcal coverage, shingles and dementia risk reduction, and RSV hospitalization risk reduction. - Administer Prevnar 20 vaccination today. - Recommend shingles vaccination at pharmacy. - Recommend RSV vaccination at pharmacy.      Randie Bustle, MD

## 2023-06-14 NOTE — Addendum Note (Signed)
 Addended by: Naevia Unterreiner C on: 06/14/2023 03:54 PM   Modules accepted: Orders

## 2023-06-15 ENCOUNTER — Ambulatory Visit: Payer: Self-pay | Admitting: Family Medicine

## 2023-06-15 LAB — LIPID PANEL
Cholesterol: 194 mg/dL (ref 0–200)
HDL: 48.2 mg/dL (ref >39.00)
LDL Cholesterol: 123 mg/dL — ABNORMAL HIGH (ref 0–99)
NonHDL: 145.33
Total CHOL/HDL Ratio: 4
Triglycerides: 113 mg/dL (ref 0.0–149.0)
VLDL: 22.6 mg/dL (ref 0.0–40.0)

## 2023-06-15 LAB — COMPREHENSIVE METABOLIC PANEL WITH GFR
ALT: 12 U/L (ref 0–35)
AST: 14 U/L (ref 0–37)
Albumin: 4.2 g/dL (ref 3.5–5.2)
Alkaline Phosphatase: 58 U/L (ref 39–117)
BUN: 26 mg/dL — ABNORMAL HIGH (ref 6–23)
CO2: 27 meq/L (ref 19–32)
Calcium: 9.5 mg/dL (ref 8.4–10.5)
Chloride: 105 meq/L (ref 96–112)
Creatinine, Ser: 1.5 mg/dL — ABNORMAL HIGH (ref 0.40–1.20)
GFR: 32.06 mL/min — ABNORMAL LOW (ref >60.00)
Glucose, Bld: 88 mg/dL (ref 70–99)
Potassium: 4.1 meq/L (ref 3.5–5.1)
Sodium: 142 meq/L (ref 135–145)
Total Bilirubin: 0.4 mg/dL (ref 0.2–1.2)
Total Protein: 7 g/dL (ref 6.0–8.3)

## 2023-06-15 LAB — CBC WITH DIFFERENTIAL/PLATELET
Basophils Absolute: 0.1 10*3/uL (ref 0.0–0.1)
Basophils Relative: 1.1 % (ref 0.0–3.0)
Eosinophils Absolute: 0.2 10*3/uL (ref 0.0–0.7)
Eosinophils Relative: 2.8 % (ref 0.0–5.0)
HCT: 39.5 % (ref 36.0–46.0)
Hemoglobin: 13.3 g/dL (ref 12.0–15.0)
Lymphocytes Relative: 22.5 % (ref 12.0–46.0)
Lymphs Abs: 1.5 10*3/uL (ref 0.7–4.0)
MCHC: 33.7 g/dL (ref 30.0–36.0)
MCV: 85.3 fl (ref 78.0–100.0)
Monocytes Absolute: 0.6 10*3/uL (ref 0.1–1.0)
Monocytes Relative: 9 % (ref 3.0–12.0)
Neutro Abs: 4.4 10*3/uL (ref 1.4–7.7)
Neutrophils Relative %: 64.6 % (ref 43.0–77.0)
Platelets: 228 10*3/uL (ref 150.0–400.0)
RBC: 4.63 Mil/uL (ref 3.87–5.11)
RDW: 14.6 % (ref 11.5–15.5)
WBC: 6.8 10*3/uL (ref 4.0–10.5)

## 2023-06-15 LAB — HIGH SENSITIVITY CRP: CRP, High Sensitivity: 24.76 mg/L — ABNORMAL HIGH (ref 0.000–5.000)

## 2023-06-15 LAB — MAGNESIUM: Magnesium: 1.9 mg/dL (ref 1.5–2.5)

## 2023-06-15 LAB — VITAMIN D 25 HYDROXY (VIT D DEFICIENCY, FRACTURES): VITD: 36.91 ng/mL (ref 30.00–100.00)

## 2023-06-15 LAB — TSH: TSH: 3.32 u[IU]/mL (ref 0.35–5.50)

## 2023-06-15 LAB — HEMOGLOBIN A1C: Hgb A1c MFr Bld: 5.5 % (ref 4.6–6.5)

## 2023-06-18 NOTE — Telephone Encounter (Signed)
 Copied from CRM [id number]. Topic: Clinical - Lab/Test Results >> Jun 18, 2023 12:47 PM Allyne Areola wrote: Reason for CRM: Patient is returning a call she received from [facility] regarding lab results. Advised of the message left. Patient would like for Pami Wool to give her a call back regarding concerns she has.

## 2023-07-22 ENCOUNTER — Other Ambulatory Visit (INDEPENDENT_AMBULATORY_CARE_PROVIDER_SITE_OTHER): Payer: Self-pay

## 2023-07-22 ENCOUNTER — Ambulatory Visit: Admitting: Orthopaedic Surgery

## 2023-07-22 DIAGNOSIS — Z96641 Presence of right artificial hip joint: Secondary | ICD-10-CM | POA: Diagnosis not present

## 2023-07-22 MED ORDER — TRAMADOL HCL 50 MG PO TABS
50.0000 mg | ORAL_TABLET | Freq: Two times a day (BID) | ORAL | 0 refills | Status: DC | PRN
Start: 2023-07-22 — End: 2023-09-09

## 2023-07-22 NOTE — Progress Notes (Signed)
 The patient is now 9 months status post a right total hip replacement.  She does ambulate using a cane but she does have other musculoskeletal issues.  She says the muscles are sore but she is always sore.  She is requesting something for pain and can only take Tylenol  for pain.  She has kidney disease that she cannot take ibuprofen.  I did let her know that narcotics are something we do not provide on a regular basis but I would at least give her 1 more prescription for tramadol  to try but to use sparingly.  She says her left hip is asymptomatic.  It does have known arthritis in her left hip but it does not hurt her at all.  Her right hip moves smoothly and fluidly.  Her left hip also moves smoothly and fluidly.    Standing AP pelvis shows a well-seated right total hip arthroplasty.  There is significant arthritis of the left hip.  This point follow-up can be as needed since she is doing well.  I will send in a one-time prescription for tramadol  to use sparingly.

## 2023-07-30 ENCOUNTER — Encounter (HOSPITAL_COMMUNITY): Payer: Self-pay

## 2023-07-30 ENCOUNTER — Ambulatory Visit: Payer: Self-pay

## 2023-07-30 ENCOUNTER — Ambulatory Visit (HOSPITAL_COMMUNITY)
Admission: EM | Admit: 2023-07-30 | Discharge: 2023-07-30 | Disposition: A | Discharge status: Home / Self Care | Attending: Emergency Medicine | Admitting: Emergency Medicine

## 2023-07-30 DIAGNOSIS — M25532 Pain in left wrist: Secondary | ICD-10-CM | POA: Diagnosis not present

## 2023-07-30 MED ORDER — DEXAMETHASONE SODIUM PHOSPHATE 10 MG/ML IJ SOLN
INTRAMUSCULAR | Status: AC
  Filled 2023-07-30: qty 1

## 2023-07-30 MED ORDER — DEXAMETHASONE SODIUM PHOSPHATE 10 MG/ML IJ SOLN
10.0000 mg | Freq: Once | INTRAMUSCULAR | Status: AC
  Administered 2023-07-30: 10 mg via INTRAMUSCULAR

## 2023-07-30 MED ORDER — PREDNISONE 20 MG PO TABS: 40.0000 mg | ORAL_TABLET | Freq: Every day | ORAL | 0 refills | Status: AC

## 2023-07-30 NOTE — Telephone Encounter (Signed)
  FYI Only or Action Required?: FYI only for provider.  Patient was last seen in primary care on 06/14/2023 by Domenica Harlene LABOR, MD.  Called Nurse Triage reporting Wrist Pain.  Symptoms began yesterday.  Interventions attempted: Prescription medications: tramadol , OTC creams.  Symptoms are: gradually worsening.  Triage Disposition: See HCP Within 4 Hours (Or PCP Triage), patient to [facility]  Patient/caregiver understands and will follow disposition?: Yes  Reason for Disposition  [1] SEVERE pain (e.g., excruciating, unable to use hand at all) AND [2] not improved after 2 hours of pain medicine  Answer Assessment - Initial Assessment Questions Patient has history of carpal tunnel that was treated with bracing.  Today the pain is in left wrist only and much worse.  Pain not relieved by creams or tramadol   Denies head ache, SOB, nausea, or dizziness, or chest pain   1. ONSET: When did the pain start?      A couple of days ago 2. LOCATION: Where is the pain located?     Left wrist pain, right hand dominant 3. PAIN: How bad is the pain? (Scale 1-10; or mild, moderate, severe)   - MILD (1-3): doesn't interfere with normal activities   - MODERATE (4-7): interferes with normal activities (e.g., work or school) or awakens from sleep   - SEVERE (8-10): excruciating pain, unable to use hand at all     10/10 4. WORK OR EXERCISE: Has there been any recent work or exercise that involved this part (i.e., hand or wrist) of the body?     Hurts when she moves her wrist 5. CAUSE: What do you think is causing the pain?     Unknown, history of carpel tunnel, but this pain is worse 6. AGGRAVATING FACTORS: What makes the pain worse? (e.g., using computer)     movement 7. OTHER SYMPTOMS: Do you have any other symptoms? (e.g., neck pain, swelling, rash, numbness, fever)     Denies redness or swelling, denies injury  Protocols used: Hand and Wrist Pain-A-AH

## 2023-07-30 NOTE — Discharge Instructions (Signed)
 You received an injection of Decadron  in clinic today which is a steroid to help with inflammation causing your pain.  Tomorrow start taking 2 tablets of prednisone  once daily for 5 days for additional relief of this. You can also take 500 mg of Tylenol  every 6-8 hours as needed for pain. Alternate between ice and heat to help with pain and swelling. Wear wrist brace for compression to help decrease swelling. Rest and elevate your arm periodically throughout the day. Follow-up with [facility] for further evaluation and management of your wrist pain. Otherwise follow-up with your primary care provider or return here as needed.

## 2023-07-30 NOTE — ED Triage Notes (Signed)
 Patient reports that she began having left wrist pain that radiates into the left forearm and left hand x 3 days. Patient states she used to have carpal tunnel 10 years ago. Patient denies any injury to the wrist.  Patient is wearing a left wrist brace in triage.

## 2023-07-30 NOTE — ED Provider Notes (Signed)
 [facility]    CSN: [id number] Arrival date & time: 07/30/23  1311      History   Chief Complaint Chief Complaint  Patient presents with   left wrist    HPI Terri Mckee is a 83 y.o. female.   Patient presents with left wrist that radiates to her forearm x 3 days.  Patient states that she does have a history of carpal tunnel about 10 years ago, but states that this has not bothered her since then.  Patient denies any history of surgery for this.  Patient denies any recent falls or injuries to her wrist.  Patient is currently wearing her husband's wrist brace for comfort.  Patient states that she has been taking Tylenol  without relief.  Patient states that she also did take a single dose of tramadol  last night without relief.  Of note patient does have a history of osteoarthritis as well.  The history is provided by the patient and medical records.    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   Cataract    Cataracts, bilateral 07/25/2015   Cervical cancer screening 07/07/2012   Colon polyps    Elevated serum creatinine    with ACE inhibitor (Normal MRA of renal arteries)   GERD (gastroesophageal reflux disease)    History of cyst of breast    Hoarseness of voice 03/13/2012   Hypertension    Lactose intolerance    causes gas   Medicare annual wellness visit, subsequent 09/02/2014   Muscle spasm of left lower extremity 03/21/2007   Qualifier: Diagnosis of  By: [name]    Osteoarthritis    Osteopenia 08/17/2014   Preventative health care 04/05/2016   Shortness of breath    Skin lesion 07/10/2012   Urinary incontinence 07/10/2012   Can't always get to bathroom on time   Vitamin D  deficiency 04/03/2016    Patient  Active Problem List   Diagnosis Date Noted   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   CRP elevated 09/01/2020   Grief at loss of child 09/01/2020   Pain in left knee 04/17/2020   Muscle weakness 03/31/2020   Myalgia 03/31/2020   Insulin  resistance 03/30/2018   Class 1 obesity with serious comorbidity and body mass index (BMI) of 31.0 to 31.9 in adult 03/16/2018   Muscle cramp, nocturnal 06/14/2017   Preventative health care 04/05/2016   Vitamin D  deficiency 04/03/2016   Cataracts, bilateral 07/25/2015   Left shoulder pain 01/29/2015   Breast cancer screening 09/02/2014   Decreased visual acuity 09/02/2014   Hearing loss 09/02/2014   Bilateral hip pain 09/02/2014   Medicare annual wellness visit, subsequent 09/02/2014   Osteopenia 08/17/2014   Right knee pain 09/24/2013   Low back pain 12/03/2012   Skin lesion 07/10/2012   Urinary incontinence 07/10/2012   Cervical cancer screening 07/07/2012   Cough 04/01/2012   Hoarseness of voice 03/13/2012   Cervical radiculopathy 12/03/2011   GERD 09/13/2009   Pain in both knees 12/21/2008   ECZEMA, 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   Hyperlipidemia, mixed 10/28/2007   INSOMNIA 10/28/2007   Allergic rhinitis 07/01/2007   Muscle cramps 03/21/2007   Essential hypertension 08/06/2006  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    Past Surgical History:  Procedure Laterality Date   APPENDECTOMY     BREAST CYST EXCISION Bilateral    right breast, twice   OVARY SURGERY     right ovary & tube replacement   TOTAL HIP ARTHROPLASTY Right 10/20/2022   Procedure: RIGHT TOTAL HIP ARTHROPLASTY ANTERIOR APPROACH;  Surgeon: Vernetta Lonni GRADE, MD;  Location: [facility] OR;  Service: Orthopedics;  Laterality: Right;  Attempted spinal converted to general   WRIST GANGLION EXCISION     rt    OB History     Gravida  1   Para  1   Term      Preterm      AB      Living         SAB      IAB      Ectopic      Multiple      Live Births                Home Medications    Prior to Admission medications   Medication Sig Start Date End Date Taking? Authorizing Provider  predniSONE  (DELTASONE ) 20 MG tablet Take 2 tablets (40 mg total) by mouth daily for 5 days. 07/30/23 08/04/23 Yes Johnie Flaming A, NP  acetaminophen  (TYLENOL ) 650 MG CR tablet Take 650 mg by mouth in the morning, at noon, and at bedtime.    [provider]  albuterol  (VENTOLIN  HFA) 108 (90 Base) MCG/ACT inhaler Inhale 2 puffs into the lungs every 6 (six) hours as needed for wheezing or shortness of breath. 09/24/22   Domenica Harlene LABOR, MD  aspirin  81 MG chewable tablet Chew 1 tablet (81 mg total) by mouth 2 (two) times daily. 10/23/22   Vernetta Lonni GRADE, MD  Blood Glucose Monitoring Suppl DEVI 1 each by Does not apply route in the morning, at noon, and at bedtime. May substitute to any manufacturer covered by patient's insurance. 09/28/22   Domenica Harlene LABOR, MD  calcium carbonate (OS-CAL) 600 MG TABS Take 600 mg by mouth in the morning.    [provider]  Cholecalciferol (VITAMIN D3 PO) Take 1 tablet by mouth in the morning.    [provider]  famotidine  (PEPCID ) 20 MG tablet TAKE 1 TABLET(20 MG) BY MOUTH TWICE DAILY 05/07/23   Domenica Harlene LABOR, MD  felodipine  (PLENDIL ) 10 MG 24 hr tablet TAKE 1 TABLET(10 MG) BY MOUTH DAILY 07/28/22   Domenica Harlene LABOR, MD  GARLIC PO Take 1 capsule by mouth in the morning.    [provider]  Homeopathic Products (LEG CRAMPS SL) Place 3 tablets under the tongue at bedtime.    [provider]  HYDROcodone -acetaminophen  (NORCO/VICODIN) 5-325 MG tablet Take 1-2 tablets by mouth every 6 (six) hours as needed for moderate pain (pain score 4-6). 10/23/22   Vernetta Lonni GRADE, MD  Mometasone  Furo-Formoterol  Fum 50-5 MCG/ACT AERO Inhale 2 puffs into the lungs 2 (two) times daily. 09/24/22   Domenica Harlene LABOR, MD  Multiple Vitamin (MULTIVITAMIN WITH MINERALS) TABS tablet Take 1 tablet by mouth in the  morning.    [provider]  Olopatadine HCl (PATADAY OP) Place 1 drop into both eyes 2 (two) times daily as needed (allergy/irritated eyes.).    [provider]  Omega-3 Fatty Acids (OMEGA 3 PO) Take 1 capsule by mouth in the morning and at bedtime. OmegaXL    [provider]  POTASSIUM PO Take 1 tablet by mouth  in the morning.    [provider]  traMADol  (ULTRAM ) 50 MG tablet Take 1-2 tablets (50-100 mg total) by mouth every 12 (twelve) hours as needed. 07/22/23   Vernetta Lonni GRADE, MD  Vibegron (GEMTESA) 75 MG TABS Take 75 mg by mouth daily in the afternoon.    [provider]  XYZAL  5 MG tablet Take 1 tablet (5 mg total) by mouth every evening. 06/20/18   Domenica Harlene LABOR, MD    Family History Family History  Problem Relation Age of Onset   Brain cancer Mother        deceased age 67   Alcohol abuse Mother    Obesity Mother    Other Brother        died of sepsis   Gout Brother    Alcohol abuse Father    Cancer Sister        throat   Pulmonary embolism Daughter    Mental illness Daughter        anxiety   Cancer Daughter        uterine?   Diabetes Maternal Aunt    Diabetes Maternal Aunt    Seizures Maternal Aunt    Birth defects Maternal Aunt    Lupus Grandchild    Sickle cell anemia Grandchild    Colon cancer Neg Hx    Breast cancer Neg Hx     Social History Social History   Tobacco Use   Smoking status: Former    Current packs/day: 0.00    Types: Cigarettes    Quit date: 01/13/1991    Years since quitting: 32.5   Smokeless tobacco: Never   Tobacco comments:    quit 20 years ago 1/2 ppd x 40 years  Vaping Use   Vaping status: Never Used  Substance Use Topics   Alcohol use: No   Drug use: No     Allergies   Allegra [fexofenadine], Haemophilus influenzae vaccines, Erythromycin, Oxycodone-acetaminophen , Propoxyphene n-acetaminophen , and Zocor [simvastatin]   Review of Systems Review of Systems  Per  HPI  Physical Exam Triage Vital Signs ED Triage Vitals [07/30/23 1354]  Encounter Vitals Group     BP 133/62     Girls Systolic BP Percentile      Girls Diastolic BP Percentile      Boys Systolic BP Percentile      Boys Diastolic BP Percentile      Pulse Rate 75     Resp 16     Temp 98.7 F (37.1 C)     Temp Source Oral     SpO2 97 %     Weight      Height      Head Circumference      Peak Flow      Pain Score 10     Pain Loc      Pain Education      Exclude from Growth Chart    No data found.  Updated Vital Signs BP 133/62 (BP Location: Right Arm)   Pulse 75   Temp 98.7 F (37.1 C) (Oral)   Resp 16   LMP  (LMP Unknown)   SpO2 97%   Visual Acuity Right Eye Distance:   Left Eye Distance:   Bilateral Distance:    Right Eye Near:   Left Eye Near:    Bilateral Near:     Physical Exam Vitals and nursing note reviewed.  Constitutional:      General: She is awake. She is not in  acute distress.    Appearance: Normal appearance. She is well-developed and well-groomed. She is not ill-appearing.  Musculoskeletal:     Left wrist: Swelling and tenderness present. No deformity. Decreased range of motion.     Comments: Tenderness and mild swelling noted to lateral aspect of left wrist with mildly decreased range of motion due to pain.  Skin:    General: Skin is warm and dry.  Neurological:     Mental Status: She is alert.  Psychiatric:        Behavior: Behavior is cooperative.      UC Treatments / Results  Labs (all labs ordered are listed, but only abnormal results are displayed) Labs Reviewed - No data to display  EKG   Radiology No results found.  Procedures Procedures (including critical care time)  Medications Ordered in UC Medications  dexamethasone  (DECADRON ) injection 10 mg (has no administration in time range)    Initial Impression / Assessment and Plan / UC Course  I have reviewed the triage vital signs and the nursing notes.  Pertinent  labs & imaging results that were available during my care of the patient were reviewed by me and considered in my medical decision making (see chart for details).     Patient is overall well-appearing.  Vitals are stable.  Deferred imaging due to lack of trauma or injury.  Exam findings could be consistent with underlying tendinitis.  Given IM Decadron  in clinic and prescribed prednisone  burst for this.  Given orthopedic follow-up.  Discussed follow-up and return precautions Final Clinical Impressions(s) / UC Diagnoses   Final diagnoses:  Left wrist pain     Discharge Instructions      You received an injection of Decadron  in clinic today which is a steroid to help with inflammation causing your pain.  Tomorrow start taking 2 tablets of prednisone  once daily for 5 days for additional relief of this. You can also take 500 mg of Tylenol  every 6-8 hours as needed for pain. Alternate between ice and heat to help with pain and swelling. Wear wrist brace for compression to help decrease swelling. Rest and elevate your arm periodically throughout the day. Follow-up with [facility] for further evaluation and management of your wrist pain. Otherwise follow-up with your primary care provider or return here as needed.   ED Prescriptions     Medication Sig Dispense Auth. Provider   predniSONE  (DELTASONE ) 20 MG tablet Take 2 tablets (40 mg total) by mouth daily for 5 days. 10 tablet Johnie Flaming A, NP      PDMP not reviewed this encounter.   Johnie Flaming A, NP 07/30/23 [phone]

## 2023-08-09 ENCOUNTER — Other Ambulatory Visit: Payer: Self-pay | Admitting: Family Medicine

## 2023-08-09 DIAGNOSIS — I10 Essential (primary) hypertension: Secondary | ICD-10-CM

## 2023-09-08 NOTE — Assessment & Plan Note (Signed)
 Supplement and monitor

## 2023-09-08 NOTE — Assessment & Plan Note (Signed)
 Encourage heart healthy diet such as MIND or DASH diet, increase exercise, avoid trans fats, simple carbohydrates and processed foods, consider a krill or fish or flaxseed oil cap daily.

## 2023-09-08 NOTE — Assessment & Plan Note (Signed)
 Well controlled, no changes to meds. Encouraged heart healthy diet such as the DASH diet and exercise as tolerated.

## 2023-09-08 NOTE — Assessment & Plan Note (Signed)
 Hydrate and monitor

## 2023-09-09 ENCOUNTER — Ambulatory Visit (HOSPITAL_BASED_OUTPATIENT_CLINIC_OR_DEPARTMENT_OTHER)
Admission: RE | Admit: 2023-09-09 | Discharge: 2023-09-09 | Disposition: A | Discharge status: Home / Self Care | Source: Ambulatory Visit | Attending: Family Medicine | Admitting: Family Medicine

## 2023-09-09 ENCOUNTER — Telehealth: Payer: Self-pay | Admitting: Family Medicine

## 2023-09-09 ENCOUNTER — Ambulatory Visit (INDEPENDENT_AMBULATORY_CARE_PROVIDER_SITE_OTHER): Payer: Medicare Other | Admitting: Family Medicine

## 2023-09-09 ENCOUNTER — Encounter: Payer: Self-pay | Admitting: Family Medicine

## 2023-09-09 VITALS — BP 128/62 | HR 81 | Resp 16 | Ht 69.0 in | Wt 221.6 lb

## 2023-09-09 DIAGNOSIS — I10 Essential (primary) hypertension: Secondary | ICD-10-CM

## 2023-09-09 DIAGNOSIS — M79602 Pain in left arm: Secondary | ICD-10-CM | POA: Diagnosis not present

## 2023-09-09 DIAGNOSIS — R252 Cramp and spasm: Secondary | ICD-10-CM | POA: Diagnosis not present

## 2023-09-09 DIAGNOSIS — E559 Vitamin D deficiency, unspecified: Secondary | ICD-10-CM

## 2023-09-09 DIAGNOSIS — E782 Mixed hyperlipidemia: Secondary | ICD-10-CM | POA: Diagnosis not present

## 2023-09-09 DIAGNOSIS — J441 Chronic obstructive pulmonary disease with (acute) exacerbation: Secondary | ICD-10-CM

## 2023-09-09 LAB — CBC WITH DIFFERENTIAL/PLATELET
Basophils Absolute: 0 K/uL (ref 0.0–0.1)
Basophils Relative: 0.3 % (ref 0.0–3.0)
Eosinophils Absolute: 0.2 K/uL (ref 0.0–0.7)
Eosinophils Relative: 2 % (ref 0.0–5.0)
HCT: 37.3 % (ref 36.0–46.0)
Hemoglobin: 12.3 g/dL (ref 12.0–15.0)
Lymphocytes Relative: 21 % (ref 12.0–46.0)
Lymphs Abs: 1.7 K/uL (ref 0.7–4.0)
MCHC: 33 g/dL (ref 30.0–36.0)
MCV: 83.9 fl (ref 78.0–100.0)
Monocytes Absolute: 0.7 K/uL (ref 0.1–1.0)
Monocytes Relative: 8.9 % (ref 3.0–12.0)
Neutro Abs: 5.5 K/uL (ref 1.4–7.7)
Neutrophils Relative %: 67.8 % (ref 43.0–77.0)
Platelets: 267 K/uL (ref 150.0–400.0)
RBC: 4.44 Mil/uL (ref 3.87–5.11)
RDW: 15 % (ref 11.5–15.5)
WBC: 8.1 K/uL (ref 4.0–10.5)

## 2023-09-09 LAB — COMPLETE METABOLIC PANEL WITHOUT GFR
AG Ratio: 1.2 (calc) (ref 1.0–2.5)
ALT: 11 U/L (ref 6–29)
AST: 13 U/L (ref 10–35)
Albumin: 3.9 g/dL (ref 3.6–5.1)
Alkaline phosphatase (APISO): 55 U/L (ref 37–153)
BUN/Creatinine Ratio: 11 (calc) (ref 6–22)
BUN: 12 mg/dL (ref 7–25)
CO2: 27 mmol/L (ref 20–32)
Calcium: 9.8 mg/dL (ref 8.6–10.4)
Chloride: 105 mmol/L (ref 98–110)
Creat: 1.06 mg/dL — ABNORMAL HIGH (ref 0.60–0.95)
Globulin: 3.3 g/dL (ref 1.9–3.7)
Glucose, Bld: 82 mg/dL (ref 65–99)
Potassium: 3.9 mmol/L (ref 3.5–5.3)
Sodium: 141 mmol/L (ref 135–146)
Total Bilirubin: 0.3 mg/dL (ref 0.2–1.2)
Total Protein: 7.2 g/dL (ref 6.1–8.1)

## 2023-09-09 LAB — SEDIMENTATION RATE: Sed Rate: 35 mm/h — ABNORMAL HIGH (ref 0–30)

## 2023-09-09 LAB — HIGH SENSITIVITY CRP: CRP, High Sensitivity: 26.64 mg/L — ABNORMAL HIGH (ref 0.000–5.000)

## 2023-09-09 MED ORDER — FAMOTIDINE 20 MG PO TABS: 20.0000 mg | ORAL_TABLET | Freq: Two times a day (BID) | ORAL | 1 refills | Status: AC

## 2023-09-09 MED ORDER — FELODIPINE ER 10 MG PO TB24: ORAL_TABLET | ORAL | 1 refills | Status: AC

## 2023-09-09 MED ORDER — MOMETASONE FURO-FORMOTEROL FUM 50-5 MCG/ACT IN AERO: 2.0000 | INHALATION_SPRAY | Freq: Two times a day (BID) | RESPIRATORY_TRACT | 5 refills | Status: AC

## 2023-09-09 MED ORDER — HYDROCODONE-ACETAMINOPHEN 5-325 MG PO TABS: 1.0000 | ORAL_TABLET | Freq: Two times a day (BID) | ORAL | 0 refills | Status: AC | PRN

## 2023-09-09 MED ORDER — ALBUTEROL SULFATE HFA 108 (90 BASE) MCG/ACT IN AERS: 2.0000 | INHALATION_SPRAY | Freq: Four times a day (QID) | RESPIRATORY_TRACT | 5 refills | Status: AC | PRN

## 2023-09-09 MED ORDER — METHYLPREDNISOLONE 4 MG PO TABS: ORAL_TABLET | ORAL | 1 refills | Status: AC

## 2023-09-09 NOTE — Telephone Encounter (Signed)
 Opened in error

## 2023-09-09 NOTE — Progress Notes (Signed)
 Subjective:    Patient ID: Terri Mckee, female    DOB: 1940/05/06, 83 y.o.   MRN: [id number]  Chief Complaint  Patient presents with   Medical Management of Chronic Issues    Patient presents today for a 2 month follow-up.   Quality Metric Gaps    Zoster vaccines    HPI Discussed the use of AI scribe software for clinical note transcription with the patient, who gave verbal consent to proceed.  History of Present Illness Terri Mckee is an 83 year old female who presents with persistent left arm pain.  She has been experiencing persistent left arm pain for over a month, which began gradually and has worsened over time. The pain is described as aching and affects the entire circumference of the arm from the elbow to the hand, sometimes extending into the fingers and causing stiffness. She visited urgent care in mid-July due to severe pain, rated at 10 out of 10, and was treated with a hip injection and a course of prednisone , which temporarily alleviated the pain. However, the pain returned approximately three to four days after completing the prednisone  course. The pain is constant, with varying intensity, and is exacerbated by movement. She finds some relief by propping her arm on a pillow and using a splint to prevent wrist movement, which otherwise increases the pain. No recent trauma or injury to the arm. There is no pain radiating from the shoulder or neck, and no cramping or spasms in the arm muscles.  She has been using ice for pain relief, which provides some temporary relief. She has also lost approximately 10 pounds unintentionally, likely due to difficulty preparing meals and reduced appetite. Her current medications include hydrocodone  for pain management, which she uses only when necessary, and she has previously used tramadol , which was ineffective. She also takes blood pressure medication (felodipine ) and famotidine  for acid reflux.  She reports  a history of osteoarthritis, which contributes to stiffness in her hands, particularly in the morning or after periods of inactivity. She tries to keep her fingers moving to prevent stiffness.  She has been experiencing whistling in her chest at night and ran out of her inhalers a couple of weeks ago. She uses mometasone  and albuterol  inhalers for her respiratory condition.    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   Cataract    Cataracts, bilateral 07/25/2015   Cervical cancer screening 07/07/2012   Colon polyps    Elevated serum creatinine    with ACE inhibitor (Normal MRA of renal arteries)   GERD (gastroesophageal reflux disease)    History of cyst of breast    Hoarseness of voice 03/13/2012   Hypertension    Lactose intolerance    causes gas   Medicare annual wellness visit, subsequent 09/02/2014   Muscle spasm of left lower extremity 03/21/2007   Qualifier: Diagnosis of  By: [name]    Osteoarthritis    Osteopenia 08/17/2014   Preventative health care 04/05/2016   Shortness of breath    Skin lesion 07/10/2012   Urinary incontinence 07/10/2012   Can't always get to bathroom on time   Vitamin D  deficiency  04/03/2016    Past Surgical History:  Procedure Laterality Date   APPENDECTOMY     BREAST CYST EXCISION Bilateral    right breast, twice   OVARY SURGERY     right ovary & tube replacement   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   Diabetes Maternal Aunt    Diabetes Maternal Aunt    Seizures Maternal Aunt    Birth defects Maternal Aunt    Lupus Grandchild    Sickle cell anemia Grandchild    Colon cancer Neg Hx    Breast cancer Neg Hx     Social History   Socioeconomic History   Marital status: Married    Spouse name: Alm   Number of children: 0   Years of education: Not on file   Highest education level: Not on file  Occupational History   Occupation: retired  Tobacco Use   Smoking status: Former    Current packs/day: 0.00    Types: Cigarettes    Quit date: 01/13/1991    Years since quitting: 32.6   Smokeless tobacco: Never   Tobacco comments:    quit 20 years ago 1/2 ppd x 40 years  Vaping Use   Vaping status: Never Used  Substance and Sexual Activity   Alcohol use: No   Drug use: No   Sexual activity: Never    Comment: lives with husband, no dietary restrictions,   Other Topics Concern   Not on file  Social History Narrative   Former Smoker - quit 30 years ago (1/2 ppd x 40 years) Daughter passed away in 2020-09-15 d/t kidney cancer       Social Drivers of Corporate investment banker Strain: Low Risk  (02/04/2023)   Overall Financial Resource Strain (CARDIA)    Difficulty of Paying Living Expenses: Not hard at all  Food Insecurity: No Food Insecurity (02/04/2023)   Hunger Vital Sign    Worried About Running Out of Food in the Last Year: Never true    Ran Out of Food in the Last Year: Never true  Transportation Needs: No Transportation Needs (02/04/2023)   PRAPARE - Administrator, Civil Service (Medical): No    Lack of Transportation (Non-Medical): No  Physical Activity: Sufficiently  Active (02/04/2023)   Exercise Vital Sign    Days of Exercise per Week: 5 days    Minutes of Exercise per Session: 30 min  Stress: No Stress Concern Present (02/04/2023)   [employer] of Occupational Health - Occupational Stress Questionnaire    Feeling of Stress : Not at all  Social Connections: Socially Integrated (02/04/2023)   Social Connection and  Isolation Panel    Frequency of Communication with Friends and Family: More than three times a week    Frequency of Social Gatherings with Friends and Family: More than three times a week    Attends Religious Services: More than 4 times per year    Active Member of [employer] or Organizations: Yes    Attends Engineer, structural: More than 4 times per year    Marital Status: Married  Catering manager Violence: Not At Risk (02/04/2023)   Humiliation, Afraid, Rape, and Kick questionnaire    Fear of Current or Ex-Partner: No    Emotionally Abused: No    Physically Abused: No    Sexually Abused: No    Outpatient Medications Prior to Visit  Medication Sig Dispense Refill   acetaminophen  (TYLENOL ) 650 MG CR tablet Take 650 mg by mouth in the morning, at noon, and at bedtime.     aspirin  81 MG chewable tablet Chew 1 tablet (81 mg total) by mouth 2 (two) times daily. 30 tablet 0   Blood Glucose Monitoring Suppl DEVI 1 each by Does not apply route in the morning, at noon, and at bedtime. May substitute to any manufacturer covered by patient's insurance. 1 each 0   calcium carbonate (OS-CAL) 600 MG TABS Take 600 mg by mouth in the morning.     Cholecalciferol (VITAMIN D3 PO) Take 1 tablet by mouth in the morning.     GARLIC PO Take 1 capsule by mouth in the morning.     Homeopathic Products (LEG CRAMPS SL) Place 3 tablets under the tongue at bedtime.     Multiple Vitamin (MULTIVITAMIN WITH MINERALS) TABS tablet Take 1 tablet by mouth in the morning.     Olopatadine HCl (PATADAY OP) Place 1 drop into both eyes 2 (two) times daily as needed  (allergy/irritated eyes.).     Omega-3 Fatty Acids (OMEGA 3 PO) Take 1 capsule by mouth in the morning and at bedtime. OmegaXL     POTASSIUM PO Take 1 tablet by mouth in the morning.     Vibegron (GEMTESA) 75 MG TABS Take 75 mg by mouth daily in the afternoon.     XYZAL  5 MG tablet Take 1 tablet (5 mg total) by mouth every evening. 90 tablet 3   albuterol  (VENTOLIN  HFA) 108 (90 Base) MCG/ACT inhaler Inhale 2 puffs into the lungs every 6 (six) hours as needed for wheezing or shortness of breath. 18 g 5   famotidine  (PEPCID ) 20 MG tablet TAKE 1 TABLET(20 MG) BY MOUTH TWICE DAILY 180 tablet 0   felodipine  (PLENDIL ) 10 MG 24 hr tablet TAKE 1 TABLET(10 MG) BY MOUTH DAILY 90 tablet 1   Mometasone  Furo-Formoterol  Fum 50-5 MCG/ACT AERO Inhale 2 puffs into the lungs 2 (two) times daily. 1 each 5   traMADol  (ULTRAM ) 50 MG tablet Take 1-2 tablets (50-100 mg total) by mouth every 12 (twelve) hours as needed. 30 tablet 0   HYDROcodone -acetaminophen  (NORCO/VICODIN) 5-325 MG tablet Take 1-2 tablets by mouth every 6 (six) hours as needed for moderate pain (pain score 4-6). 30 tablet 0   No facility-administered medications prior to visit.    Allergies  Allergen Reactions   Allegra [Fexofenadine] Shortness Of Breath   Haemophilus Influenzae Vaccines Nausea And Vomiting   Erythromycin Nausea Only   Oxycodone-Acetaminophen  Nausea And Vomiting   Propoxyphene N-Acetaminophen  Nausea And Vomiting      severe vomiting   Zocor [Simvastatin] Other (See Comments)      Myalgia  Review of Systems  Constitutional:  Negative for fever and malaise/fatigue.  HENT:  Negative for congestion.   Eyes:  Negative for blurred vision.  Respiratory:  Positive for shortness of breath and wheezing.   Cardiovascular:  Negative for chest pain, palpitations and leg swelling.  Gastrointestinal:  Negative for abdominal pain, blood in stool and nausea.  Genitourinary:  Negative for dysuria and frequency.  Musculoskeletal:   Positive for joint pain and myalgias. Negative for falls.  Skin:  Negative for rash.  Neurological:  Negative for dizziness, loss of consciousness and headaches.  Endo/Heme/Allergies:  Negative for environmental allergies.  Psychiatric/Behavioral:  Negative for depression. The patient is not nervous/anxious.        Objective:    Physical Exam Constitutional:      General: She is not in acute distress.    Appearance: Normal appearance. She is well-developed. She is not toxic-appearing.  HENT:     Head: Normocephalic and atraumatic.     Right Ear: External ear normal.     Left Ear: External ear normal.     Nose: Nose normal.  Eyes:     General:        Right eye: No discharge.        Left eye: No discharge.     Conjunctiva/sclera: Conjunctivae normal.  Neck:     Thyroid : No thyromegaly.  Cardiovascular:     Rate and Rhythm: Normal rate and regular rhythm.     Heart sounds: Normal heart sounds. No murmur heard. Pulmonary:     Effort: Pulmonary effort is normal. No respiratory distress.     Breath sounds: Normal breath sounds.  Abdominal:     General: Bowel sounds are normal.     Palpations: Abdomen is soft.     Tenderness: There is no abdominal tenderness. There is no guarding.  Musculoskeletal:        General: Normal range of motion.     Cervical back: Neck supple.     Comments: Splint on left forearm. No redness, warmth or swelling. Fingers move well  Lymphadenopathy:     Cervical: No cervical adenopathy.  Skin:    General: Skin is warm and dry.  Neurological:     Mental Status: She is alert and oriented to person, place, and time.  Psychiatric:        Mood and Affect: Mood normal.        Behavior: Behavior normal.        Thought Content: Thought content normal.        Judgment: Judgment normal.     BP 128/62   Pulse 81   Resp 16   Ht 5' 9 (1.753 m)   Wt 221 lb 9.6 oz (100.5 kg)   LMP  (LMP Unknown)   SpO2 98%   BMI 32.72 kg/m  Wt Readings from Last 3  Encounters:  09/09/23 221 lb 9.6 oz (100.5 kg)  06/14/23 233 lb 3.2 oz (105.8 kg)  02/04/23 231 lb (104.8 kg)    Diabetic Foot Exam - Simple   No data filed    Lab Results  Component Value Date   WBC 6.8 06/14/2023   HGB 13.3 06/14/2023   HCT 39.5 06/14/2023   PLT 228.0 06/14/2023   GLUCOSE 88 06/14/2023   CHOL 194 06/14/2023   TRIG 113.0 06/14/2023   HDL 48.20 06/14/2023   LDLCALC 123 (H) 06/14/2023   ALT 12 06/14/2023   AST 14 06/14/2023   NA 142 06/14/2023   K  4.1 06/14/2023   CL 105 06/14/2023   CREATININE 1.50 (H) 06/14/2023   BUN 26 (H) 06/14/2023   CO2 27 06/14/2023   TSH 3.32 06/14/2023   HGBA1C 5.5 06/14/2023    Lab Results  Component Value Date   TSH 3.32 06/14/2023   Lab Results  Component Value Date   WBC 6.8 06/14/2023   HGB 13.3 06/14/2023   HCT 39.5 06/14/2023   MCV 85.3 06/14/2023   PLT 228.0 06/14/2023   Lab Results  Component Value Date   NA 142 06/14/2023   K 4.1 06/14/2023   CO2 27 06/14/2023   GLUCOSE 88 06/14/2023   BUN 26 (H) 06/14/2023   CREATININE 1.50 (H) 06/14/2023   BILITOT 0.4 06/14/2023   ALKPHOS 58 06/14/2023   AST 14 06/14/2023   ALT 12 06/14/2023   PROT 7.0 06/14/2023   ALBUMIN 4.2 06/14/2023   CALCIUM 9.5 06/14/2023   ANIONGAP 10 10/21/2022   EGFR 42.0 01/29/2022   GFR 32.06 (L) 06/14/2023   Lab Results  Component Value Date   CHOL 194 06/14/2023   Lab Results  Component Value Date   HDL 48.20 06/14/2023   Lab Results  Component Value Date   LDLCALC 123 (H) 06/14/2023   Lab Results  Component Value Date   TRIG 113.0 06/14/2023   Lab Results  Component Value Date   CHOLHDL 4 06/14/2023   Lab Results  Component Value Date   HGBA1C 5.5 06/14/2023       Assessment & Plan:  Essential hypertension Assessment & Plan: Well controlled, no changes to meds. Encouraged heart healthy diet such as the DASH diet and exercise as tolerated.   Orders: -     Felodipine  ER; TAKE 1 TABLET(10 MG) BY MOUTH  DAILY  Dispense: 90 tablet; Refill: 1  Hyperlipidemia, mixed Assessment & Plan: Encourage heart healthy diet such as MIND or DASH diet, increase exercise, avoid trans fats, simple carbohydrates and processed foods, consider a krill or fish or flaxseed oil cap daily.    Muscle cramps Assessment & Plan: Hydrate and monitor    Vitamin D  deficiency Assessment & Plan: Supplement and monitor   Left arm pain -     CBC with Differential/Platelet -     COMPLETE METABOLIC PANEL WITHOUT GFR -     Sedimentation rate -     High sensitivity CRP -     DG Forearm Left; Future -     Ambulatory referral to Orthopedic Surgery  COPD exacerbation (HCC) -     Albuterol  Sulfate HFA; Inhale 2 puffs into the lungs every 6 (six) hours as needed for wheezing or shortness of breath.  Dispense: 18 g; Refill: 5  Other orders -     Mometasone  Furo-Formoterol  Fum; Inhale 2 puffs into the lungs 2 (two) times daily.  Dispense: 1 each; Refill: 5 -     Famotidine ; Take 1 tablet (20 mg total) by mouth 2 (two) times daily.  Dispense: 180 tablet; Refill: 1 -     methylPREDNISolone ; 6 tabs po x 1 day then 5 tabs po x 1 day then 4 tabs po x 1 day then 3 tabs po x 1 day then 2 tabs po x 1 day then 1 tab po x 1 day and stop  Dispense: 21 tablet; Refill: 1 -     HYDROcodone -Acetaminophen ; Take 1 tablet by mouth 2 (two) times daily between meals as needed for moderate pain (pain score 4-6).  Dispense: 40 tablet; Refill: 0  Assessment and Plan Assessment & Plan Left arm pain, wrist to elbow, chronic and severe Chronic and severe left arm pain from wrist to elbow, persisting for over a month. Pain is constant with exacerbations, worsens with movement, and is associated with swelling and stiffness. No trauma history. Previous prednisone  treatment provided temporary relief. Differential includes possible fracture or severe inflammation. - Order x-ray of left arm to rule out fracture - Prescribe Medrol  dose pack  (methylprednisolone ) for short-term relief - Advise minimizing carbohydrate intake while on steroids to manage blood sugar levels - Refer to orthopedic specialist for further evaluation and management - Prescribe hydrocodone -acetaminophen  5-325 mg for pain management as needed  Osteoarthritis of hands Osteoarthritis in hands, exacerbated by reduced movement due to arm pain. Symptoms include stiffness and swelling, particularly in the morning or after inactivity. - Encourage regular movement of fingers to prevent stiffness - Educate on the importance of maintaining mobility to manage arthritis symptoms  Chronic pain syndrome Chronic pain syndrome with significant impact on daily activities and quality of life. Current pain management includes hydrocodone -acetaminophen , which is effective for her. - Continue hydrocodone -acetaminophen  as needed for pain management - Monitor for potential side effects of hydrocodone , including confusion, falls, and constipation  Essential hypertension Essential hypertension, currently managed with felodipine . Recent lapse in medication due to prescription refill issues. - Refill felodipine  prescription for blood pressure management - Advise on the importance of not running out of medications and to communicate with the office for refills  Chronic obstructive pulmonary disease (COPD) COPD with recent exacerbation of symptoms, including nocturnal wheezing, likely due to running out of inhalers. Currently not on inhalers due to prescription refill issues. - Refill mometasone  furoate-formoterol  fumarate inhaler and albuterol  inhaler - Advise on the use of a humidifier at night to help with breathing - Instruct to contact the office if symptoms worsen or if inhalers are not received  Recording duration: 33 minutes     Harlene Horton, MD

## 2023-09-10 ENCOUNTER — Other Ambulatory Visit: Payer: Self-pay | Admitting: Family Medicine

## 2023-09-10 ENCOUNTER — Ambulatory Visit: Payer: Self-pay | Admitting: Family Medicine

## 2023-09-10 DIAGNOSIS — Z1231 Encounter for screening mammogram for malignant neoplasm of breast: Secondary | ICD-10-CM

## 2023-10-18 ENCOUNTER — Ambulatory Visit

## 2023-10-28 ENCOUNTER — Ambulatory Visit
Admission: RE | Admit: 2023-10-28 | Discharge: 2023-10-28 | Disposition: A | Discharge status: Home / Self Care | Source: Ambulatory Visit | Attending: Family Medicine | Admitting: Family Medicine

## 2023-10-28 DIAGNOSIS — Z1231 Encounter for screening mammogram for malignant neoplasm of breast: Secondary | ICD-10-CM

## 2023-11-30 ENCOUNTER — Ambulatory Visit: Payer: Self-pay

## 2023-11-30 NOTE — Telephone Encounter (Signed)
 FYI Only or Action Required?: FYI only for provider: appointment scheduled on 12/01/23.  Patient was last seen in primary care on 09/09/2023 by Domenica Harlene LABOR, MD.  Called Nurse Triage reporting Shortness of Breath.  Symptoms began yesterday.  Interventions attempted: Other: called rescue and received albuterol  nebulizer treatment.  Symptoms are: mild SOB, last night felt severely SOB suddenly when going through dusty items that had been in storage gradually improving.  Triage Disposition: See Physician Within 24 Hours (overriding See HCP Within 4 Hours (Or PCP Triage))  Patient/caregiver understands and will follow disposition?: Yes              Copied from CRM [id number]. Topic: Clinical - Red Word Triage >> Nov 30, 2023 11:15 AM Harlene ORN wrote: Red Word that prompted transfer to Nurse Triage: Patient on the line was in the ambulance last night due to shortness of breath. Had an irregular heartbeat. Can catch her breath now, but still having a little short of breath. Reason for Disposition  [1] MILD difficulty breathing (e.g., minimal/no SOB at rest, SOB with walking, pulse < 100) AND [2] NEW-onset or WORSE than normal  Answer Assessment - Initial Assessment Questions 1. RESPIRATORY STATUS: Describe your breathing? (e.g., wheezing, shortness of breath, unable to speak, severe coughing)      She states she called rescue last night, severe SOB and was struggling to breath. She states she got albuterol  nebulizer treatment from rescue and felt better. Patient did not got to ED. She states some nights when in bed she can hear some wheezing.  2. ONSET: When did this breathing problem begin?      Yesterday evening.  3. PATTERN Does the difficult breathing come and go, or has it been constant since it started?      Constant, she states she feels SOB every day.  4. SEVERITY: How bad is your breathing? (e.g., mild, moderate, severe)      Mild. Patient speaking in full  sentences and no labored breathing, wheezing or respiratory distress noted during triage.  5. RECURRENT SYMPTOM: Have you had difficulty breathing before? If Yes, ask: When was the last time? and What happened that time?      She states it is an every day occurrence.   6. CARDIAC HISTORY: Do you have any history of heart disease? (e.g., heart attack, angina, bypass surgery, angioplasty)      No. She states she was told once that she may have had a prior heart attack years ago.  7. LUNG HISTORY: Do you have any history of lung disease?  (e.g., pulmonary embolus, asthma, emphysema)     Bronchitis. She states her inhalers don't help. She states she used to be on Symbicort  which helped but insurance stopped paying for it.  8. CAUSE: What do you think is causing the breathing problem?      She states she was going through her deceased daughters items and states it was sudden that she had difficulty breathing last night. She thinks it was related to dust on those items.  9. OTHER SYMPTOMS: Do you have any other symptoms? (e.g., chest pain, cough, dizziness, fever, runny nose)     Denies fever, cough, cold symptoms, chest pain.  10. O2 SATURATION MONITOR:  Do you use an oxygen saturation monitor (pulse oximeter) at home? If Yes, ask: What is your reading (oxygen level) today? What is your usual oxygen saturation reading? (e.g., 95%)       She states she has one  but lost it. She states rescue checked her but didn't tell her what her levels were. They also did an EKG and told her to take that with her to there PCP.  11. PREGNANCY: Is there any chance you are pregnant? When was your last menstrual period?       N/A.  12. TRAVEL: Have you traveled out of the country in the last month? (e.g., travel history, exposures)       No.  Protocols used: Breathing Difficulty-A-AH

## 2023-12-01 ENCOUNTER — Ambulatory Visit (INDEPENDENT_AMBULATORY_CARE_PROVIDER_SITE_OTHER): Admitting: Family Medicine

## 2023-12-01 ENCOUNTER — Telehealth: Payer: Self-pay

## 2023-12-01 ENCOUNTER — Encounter: Payer: Self-pay | Admitting: Family Medicine

## 2023-12-01 ENCOUNTER — Other Ambulatory Visit: Payer: Self-pay

## 2023-12-01 VITALS — BP 126/74 | HR 57 | Temp 98.0°F | Resp 16 | Ht 69.0 in | Wt 223.4 lb

## 2023-12-01 DIAGNOSIS — J441 Chronic obstructive pulmonary disease with (acute) exacerbation: Secondary | ICD-10-CM

## 2023-12-01 DIAGNOSIS — R0602 Shortness of breath: Secondary | ICD-10-CM

## 2023-12-01 MED ORDER — PREDNISONE 20 MG PO TABS: 40.0000 mg | ORAL_TABLET | Freq: Every day | ORAL | 0 refills | Status: AC

## 2023-12-01 MED ORDER — IPRATROPIUM-ALBUTEROL 0.5-2.5 (3) MG/3ML IN SOLN: 3.0000 mL | Freq: Four times a day (QID) | RESPIRATORY_TRACT | Status: DC | PRN

## 2023-12-01 MED ORDER — IPRATROPIUM-ALBUTEROL 0.5-2.5 (3) MG/3ML IN SOLN
3.0000 mL | Freq: Once | RESPIRATORY_TRACT | Status: AC
  Administered 2023-12-01: 3 mL via RESPIRATORY_TRACT

## 2023-12-01 NOTE — Progress Notes (Signed)
 Chief Complaint  Patient presents with   Shortness of Breath    Shortness Of Breath    Terri Mckee here for URI complaints.  Duration: 2 days  Associated symptoms: wheezing and SOB.  Denies: sinus congestion, sinus pain, rhinorrhea, itchy watery eyes, ear pain, ear drainage, sore throat, chest tightness, myalgia, and fevers Treatment to date: Dulera , SABA Sick contacts: No Denies history of blood clot, recent travel, trauma, or recent surgeries.  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   Cataract    Cataracts, bilateral 07/25/2015   Cervical cancer screening 07/07/2012   Colon polyps    Elevated serum creatinine    with ACE inhibitor (Normal MRA of renal arteries)   GERD (gastroesophageal reflux disease)    History of cyst of breast    Hoarseness of voice 03/13/2012   Hypertension    Lactose intolerance    causes gas   Medicare annual wellness visit, subsequent 09/02/2014   Muscle spasm of left lower extremity 03/21/2007   Qualifier: Diagnosis of  By: [name]    Osteoarthritis    Osteopenia 08/17/2014   Preventative health care 04/05/2016   Shortness of breath    Skin lesion 07/10/2012   Urinary incontinence 07/10/2012   Can't always get to bathroom on time   Vitamin D  deficiency 04/03/2016    Objective BP 126/74 (BP Location: Left Arm, Patient Position: Sitting)   Pulse (!) 57   Temp 98 F (36.7 C) (Oral)   Resp 16   Ht 5' 9 (1.753 m)   Wt 223 lb 6.4 oz (101.3 kg)   LMP  (LMP Unknown)   SpO2 95%   BMI 32.99 kg/m  General: Awake, alert, appears stated age HEENT: AT, [place], ears patent b/l and TM's neg, nares patent w/o discharge, pharynx pink and without exudates, MMM, no sinus TTP Neck: No masses or  asymmetry Heart: Reg rhythm, bradycardic, no lower extremity edema MSK: No calf pain Lungs: CTAB, no obvious wheezing but decreased airflow.  No accessory muscle use Psych: Age appropriate judgment and insight, normal mood and affect  Shortness of breath  COPD exacerbation (HCC) - Plan: predniSONE  (DELTASONE ) 20 MG tablet  Questionable history of COPD.  Decreased airflow today.  DuoNeb treatment was helpful. Will work to get her a engineer, materials and Duoneb solution. 5-day prednisone  burst 40 mg daily for exacerbation of chronic issue.  Check a chest x-ray if we aren't improving.  Continue to push fluids, practice good hand hygiene, cover mouth when coughing. F/u prn. If starting to experience fevers, shaking, or shortness of breath, seek immediate care. Pt voiced understanding and agreement to the plan.  [name], DO 12/01/23 12:10 PM

## 2023-12-01 NOTE — Addendum Note (Signed)
 Addended by: Aayan Haskew M on: 12/01/2023 01:17 PM   Modules accepted: Orders

## 2023-12-01 NOTE — Addendum Note (Signed)
 Addended by: Alissah Redmon M on: 12/01/2023 12:39 PM   Modules accepted: Orders

## 2023-12-01 NOTE — Telephone Encounter (Signed)
 done

## 2023-12-02 ENCOUNTER — Ambulatory Visit: Payer: Self-pay | Admitting: Family Medicine

## 2023-12-02 MED ORDER — IPRATROPIUM-ALBUTEROL 0.5-2.5 (3) MG/3ML IN SOLN: 3.0000 mL | Freq: Four times a day (QID) | RESPIRATORY_TRACT | 2 refills | Status: AC | PRN

## 2023-12-02 NOTE — Telephone Encounter (Signed)
 Sent!

## 2023-12-02 NOTE — Telephone Encounter (Signed)
 Pt given Duoneb during visit yesterday, requesting a prescription. Please advise?

## 2023-12-02 NOTE — Telephone Encounter (Signed)
 Copied from CRM [id number]. Topic: Clinical - Medication Question >> Dec 02, 2023 11:19 AM Drema MATSU wrote: Reason for CRM: Patient stated that she needs Duoneb solution that goes into the nebulizer machine for her breathing. She said that the pharmacy told her that nothing was called in.

## 2023-12-02 NOTE — Telephone Encounter (Signed)
 FYI Only or Action Required?: Action required by provider: medication refill request.  Patient was last seen in primary care on 12/01/2023 by Frann Mabel Mt, DO.  Called Nurse Triage reporting Medication Refill.  Symptoms began today.  Interventions attempted: Prescription medications: Albuterol  Inhaler.  Symptoms are: stable.  Triage Disposition: Home Care  Patient/caregiver understands and will follow disposition?: Yes Reason for Disposition  [1] Prescription prescribed recently is not at pharmacy AND [2] triager has access to patient's EMR AND [3] prescription is recorded in the EMR  Answer Assessment - Initial Assessment Questions Medication on med list but unable to reorder.  1. DRUG NAME: What medicine do you need to have refilled?     ipratropium-albuterol  (DUONEB) 0.5-2.5 (3) MG/3ML nebulizer solution 3 mL   [[id number]]    2. PRESCRIBER: Who prescribed it? Note: The prescribing doctor or group is responsible for refill approvals.SABRA Frann, Mabel Mt, DO  3.. PHARMACY: Have you contacted your pharmacy (drugstore)? Note: Some pharmacies will contact the doctor (or NP/PA).      [id number] - [place], [place] - [address] AT [facility] [address], [place]  [phone]  4. SYMPTOMS: Do you have any symptoms?     Mild SOB, denies wheezing. Using Albuterol  inhaler currently to relieve symptoms  Protocols used: Medication Refill and Renewal Call-A-AH

## 2023-12-02 NOTE — Addendum Note (Signed)
 Addended by: FRANN MABEL SQUIBB on: 12/02/2023 01:37 PM   Modules accepted: Orders

## 2023-12-15 NOTE — Progress Notes (Deleted)
 Subjective:     Patient ID: Terri Mckee, female    DOB: 06-05-1940, 83 y.o.   MRN: [id number]  No chief complaint on file.   HPI  Discussed the use of AI scribe software for clinical note transcription with the patient, who gave verbal consent to proceed.  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   Cataract    Cataracts, bilateral 07/25/2015   Cervical cancer screening 07/07/2012   Colon polyps    Elevated serum creatinine    with ACE inhibitor (Normal MRA of renal arteries)   GERD (gastroesophageal reflux disease)    History of cyst of breast    Hoarseness of voice 03/13/2012   Hypertension    Lactose intolerance    causes gas   Medicare annual wellness visit, subsequent 09/02/2014   Muscle spasm of left lower extremity 03/21/2007   Qualifier: Diagnosis of  By: [name]    Osteoarthritis    Osteopenia 08/17/2014   Preventative health care 04/05/2016   Shortness of breath    Skin lesion 07/10/2012   Urinary incontinence 07/10/2012   Can't always get to bathroom on time   Vitamin D  deficiency 04/03/2016    Past Surgical History:  Procedure Laterality Date   APPENDECTOMY     BREAST CYST EXCISION Bilateral    right breast, twice   OVARY SURGERY     right ovary & tube replacement   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   Diabetes Maternal Aunt    Diabetes Maternal Aunt    Seizures Maternal Aunt    Birth defects Maternal Aunt    Lupus Grandchild    Sickle cell anemia Grandchild    Colon cancer Neg Hx    Breast cancer Neg Hx     Social History   Socioeconomic History   Marital status: Married    Spouse name: Alm   Number of children: 0   Years of education: Not on file   Highest education level: Not on file  Occupational History   Occupation: retired  Tobacco Use  Smoking status: Former    Current packs/day: 0.00    Types: Cigarettes    Quit date: 01/13/1991    Years since quitting: 32.9   Smokeless tobacco: Never   Tobacco comments:    quit 20 years ago 1/2 ppd x 40 years  Vaping Use   Vaping status: Never Used  Substance and Sexual Activity   Alcohol use: No   Drug use: No   Sexual activity: Never    Comment: lives with husband, no dietary restrictions,   Other Topics Concern   Not on file  Social History Narrative   Former Smoker - quit 30 years ago (1/2 ppd x 40 years) Daughter passed away in 12/24/2020 d/t kidney cancer       Social Drivers of Corporate Investment Banker Strain: Low Risk  (02/04/2023)   Overall Financial Resource Strain (CARDIA)    Difficulty of Paying Living Expenses: Not hard at all  Food Insecurity: No Food Insecurity (02/04/2023)   Hunger Vital Sign    Worried About Running Out of Food in the Last Year: Never true    Ran Out of Food in the Last Year: Never true  Transportation Needs: No Transportation Needs (02/04/2023)   PRAPARE - Administrator, Civil Service (Medical):  No    Lack of Transportation (Non-Medical): No  Physical Activity: Sufficiently Active (02/04/2023)   Exercise Vital Sign    Days of Exercise per Week: 5 days    Minutes of Exercise per Session: 30 min  Stress: No Stress Concern Present (02/04/2023)   [employer] of Occupational Health - Occupational Stress Questionnaire    Feeling of Stress : Not at all  Social Connections: Socially Integrated (02/04/2023)   Social Connection and Isolation Panel    Frequency of Communication with Friends and Family: More than three times a week    Frequency of Social Gatherings with Friends and Family: More than three times a week    Attends Religious Services: More than 4 times per year    Active Member of [employer] or Organizations: Yes    Attends Engineer, Structural: More than 4 times per year    Marital Status: Married  Catering Manager Violence: Not At Risk (02/04/2023)   Humiliation, Afraid, Rape, and Kick questionnaire    Fear of Current or Ex-Partner: No    Emotionally Abused: No    Physically Abused: No    Sexually Abused: No    Outpatient Medications Prior to Visit  Medication Sig Dispense Refill   acetaminophen  (TYLENOL ) 650 MG CR tablet Take 650 mg by mouth in the morning, at noon, and at bedtime.     albuterol  (VENTOLIN  HFA) 108 (90 Base) MCG/ACT inhaler Inhale 2 puffs into the lungs every 6 (six) hours as needed for wheezing or shortness of breath. 18 g 5   aspirin  81 MG chewable tablet Chew 1 tablet (81 mg total) by mouth 2 (two) times daily. 30 tablet 0   Blood Glucose Monitoring Suppl DEVI 1 each by Does not apply route in the morning, at noon, and at bedtime. May substitute to any manufacturer covered by patient's insurance. 1 each 0   calcium carbonate (OS-CAL) 600 MG TABS Take 600 mg by mouth in the morning.     Cholecalciferol (VITAMIN D3 PO) Take 1 tablet by mouth in the morning.     famotidine  (PEPCID ) 20 MG tablet Take 1 tablet (20 mg total) by mouth 2 (two) times  daily. 180 tablet 1  felodipine  (PLENDIL ) 10 MG 24 hr tablet TAKE 1 TABLET(10 MG) BY MOUTH DAILY 90 tablet 1   GARLIC PO Take 1 capsule by mouth in the morning.     Homeopathic Products (LEG CRAMPS SL) Place 3 tablets under the tongue at bedtime.     HYDROcodone -acetaminophen  (NORCO/VICODIN) 5-325 MG tablet Take 1 tablet by mouth 2 (two) times daily between meals as needed for moderate pain (pain score 4-6). 40 tablet 0   ipratropium-albuterol  (DUONEB) 0.5-2.5 (3) MG/3ML SOLN Take 3 mLs by nebulization every 6 (six) hours as needed (coughing, wheezing, SOB). 30 mL 2   methylPREDNISolone  (MEDROL ) 4 MG tablet 6 tabs po x 1 day then 5 tabs po x 1 day then 4 tabs po x 1 day then 3 tabs po x 1 day then 2 tabs po x 1 day then 1 tab po x 1 day and stop 21 tablet 1   Mometasone  Furo-Formoterol  Fum 50-5 MCG/ACT AERO Inhale 2 puffs into the lungs 2 (two) times daily. 1 each 5   Multiple Vitamin (MULTIVITAMIN WITH MINERALS) TABS tablet Take 1 tablet by mouth in the morning.     Olopatadine HCl (PATADAY OP) Place 1 drop into both eyes 2 (two) times daily as needed (allergy/irritated eyes.).     Omega-3 Fatty Acids (OMEGA 3 PO) Take 1 capsule by mouth in the morning and at bedtime. OmegaXL     POTASSIUM PO Take 1 tablet by mouth in the morning.     Vibegron (GEMTESA) 75 MG TABS Take 75 mg by mouth daily in the afternoon.     XYZAL  5 MG tablet Take 1 tablet (5 mg total) by mouth every evening. 90 tablet 3   No facility-administered medications prior to visit.    Allergies  Allergen Reactions   Allegra [Fexofenadine] Shortness Of Breath   Haemophilus Influenzae Vaccines Nausea And Vomiting   Erythromycin Nausea Only   Oxycodone-Acetaminophen  Nausea And Vomiting   Propoxyphene N-Acetaminophen  Nausea And Vomiting      severe vomiting   Zocor [Simvastatin] Other (See Comments)      Myalgia    ROS     Objective:    Physical Exam   LMP  (LMP Unknown)  Wt Readings from Last 3 Encounters:   12/01/23 223 lb 6.4 oz (101.3 kg)  09/09/23 221 lb 9.6 oz (100.5 kg)  06/14/23 233 lb 3.2 oz (105.8 kg)       Assessment & Plan:   Problem List Items Addressed This Visit     Essential hypertension - Primary   Hyperlipidemia, mixed   Vitamin D  deficiency   Other Visit Diagnoses       Chronic obstructive pulmonary disease, unspecified COPD type (HCC)           I am having Cecillia M. Mckee Dot maintain her calcium carbonate, GARLIC PO, Xyzal , Omega-3 Fatty Acids (OMEGA 3 PO), Cholecalciferol (VITAMIN D3 PO), Blood Glucose Monitoring Suppl, multivitamin with minerals, Gemtesa, Homeopathic Products (LEG CRAMPS SL), POTASSIUM PO, acetaminophen , Olopatadine HCl (PATADAY OP), aspirin , Mometasone  Furo-Formoterol  Fum, albuterol , famotidine , methylPREDNISolone , felodipine , HYDROcodone -acetaminophen , and ipratropium-albuterol .  No orders of the defined types were placed in this encounter.

## 2023-12-16 ENCOUNTER — Ambulatory Visit: Admitting: Family Medicine

## 2023-12-16 ENCOUNTER — Ambulatory Visit: Admitting: Student

## 2023-12-16 DIAGNOSIS — E559 Vitamin D deficiency, unspecified: Secondary | ICD-10-CM

## 2023-12-16 DIAGNOSIS — E782 Mixed hyperlipidemia: Secondary | ICD-10-CM

## 2023-12-16 DIAGNOSIS — I10 Essential (primary) hypertension: Secondary | ICD-10-CM

## 2023-12-16 DIAGNOSIS — J449 Chronic obstructive pulmonary disease, unspecified: Secondary | ICD-10-CM

## 2024-02-10 ENCOUNTER — Telehealth: Payer: Self-pay

## 2024-02-10 NOTE — Telephone Encounter (Signed)
 Unsuccessful attempts to reach patient on preferred number listed in notes for scheduled AWV. Unable to leave message on voicemail.

## 2024-03-09 ENCOUNTER — Ambulatory Visit: Admitting: Family Medicine

## 2024-03-20 ENCOUNTER — Ambulatory Visit: Admitting: Family Medicine

## 2024-06-22 ENCOUNTER — Encounter: Admitting: Student
